# Patient Record
Sex: Male | Born: 1935 | Race: Black or African American | Hispanic: No | Smoking: Former smoker
Health system: Southern US, Community
[De-identification: ages and names within clinical notes are randomized; demographics above are authoritative.]

## PROBLEM LIST (undated history)

## (undated) ENCOUNTER — Emergency Department (HOSPITAL_COMMUNITY): Admission: EM | Payer: Medicare Other | Source: Home / Self Care

## (undated) DIAGNOSIS — F419 Anxiety disorder, unspecified: Secondary | ICD-10-CM

## (undated) DIAGNOSIS — N400 Enlarged prostate without lower urinary tract symptoms: Secondary | ICD-10-CM

## (undated) DIAGNOSIS — F17200 Nicotine dependence, unspecified, uncomplicated: Secondary | ICD-10-CM

## (undated) DIAGNOSIS — E785 Hyperlipidemia, unspecified: Secondary | ICD-10-CM

## (undated) DIAGNOSIS — K219 Gastro-esophageal reflux disease without esophagitis: Secondary | ICD-10-CM

## (undated) DIAGNOSIS — C20 Malignant neoplasm of rectum: Secondary | ICD-10-CM

## (undated) DIAGNOSIS — R55 Syncope and collapse: Secondary | ICD-10-CM

## (undated) DIAGNOSIS — I10 Essential (primary) hypertension: Secondary | ICD-10-CM

## (undated) DIAGNOSIS — I2510 Atherosclerotic heart disease of native coronary artery without angina pectoris: Secondary | ICD-10-CM

## (undated) HISTORY — DX: Nicotine dependence, unspecified, uncomplicated: F17.200

## (undated) HISTORY — PX: TONSILLECTOMY: SHX5217

## (undated) HISTORY — PX: APPENDECTOMY: SHX54

## (undated) HISTORY — DX: Benign prostatic hyperplasia without lower urinary tract symptoms: N40.0

## (undated) HISTORY — DX: Hyperlipidemia, unspecified: E78.5

## (undated) HISTORY — DX: Essential (primary) hypertension: I10

---

## 2004-04-18 ENCOUNTER — Ambulatory Visit: Payer: Self-pay | Admitting: Internal Medicine

## 2004-08-15 ENCOUNTER — Ambulatory Visit: Payer: Self-pay | Admitting: Internal Medicine

## 2005-02-15 ENCOUNTER — Ambulatory Visit: Payer: Self-pay | Admitting: Internal Medicine

## 2005-05-17 ENCOUNTER — Ambulatory Visit: Payer: Self-pay | Admitting: Internal Medicine

## 2005-08-30 ENCOUNTER — Ambulatory Visit: Payer: Self-pay | Admitting: Internal Medicine

## 2006-03-22 ENCOUNTER — Ambulatory Visit: Payer: Self-pay | Admitting: Internal Medicine

## 2006-03-22 LAB — CONVERTED CEMR LAB
ALT: 32 units/L (ref 0–40)
AST: 67 units/L — ABNORMAL HIGH (ref 0–37)
Bilirubin, Direct: 0.1 mg/dL (ref 0.0–0.3)
CO2: 31 meq/L (ref 19–32)
Calcium: 9.4 mg/dL (ref 8.4–10.5)
Chloride: 104 meq/L (ref 96–112)
Glucose, Bld: 102 mg/dL — ABNORMAL HIGH (ref 70–99)
HCT: 42.1 % (ref 39.0–52.0)
Hemoglobin: 13.4 g/dL (ref 13.0–17.0)
LDL Cholesterol: 119 mg/dL — ABNORMAL HIGH (ref 0–99)
Total Protein: 7.6 g/dL (ref 6.0–8.3)
VLDL: 16 mg/dL (ref 0–40)
WBC: 6.5 10*3/uL (ref 4.5–10.5)

## 2006-09-27 ENCOUNTER — Ambulatory Visit: Payer: Self-pay | Admitting: Internal Medicine

## 2007-02-07 DIAGNOSIS — I10 Essential (primary) hypertension: Secondary | ICD-10-CM | POA: Insufficient documentation

## 2007-02-07 HISTORY — DX: Essential (primary) hypertension: I10

## 2007-05-26 ENCOUNTER — Ambulatory Visit: Payer: Self-pay | Admitting: Internal Medicine

## 2007-05-26 DIAGNOSIS — E785 Hyperlipidemia, unspecified: Secondary | ICD-10-CM

## 2007-05-26 HISTORY — DX: Hyperlipidemia, unspecified: E78.5

## 2007-05-26 LAB — CONVERTED CEMR LAB
ALT: 27 units/L (ref 0–53)
AST: 53 units/L — ABNORMAL HIGH (ref 0–37)
Basophils Relative: 1.1 % — ABNORMAL HIGH (ref 0.0–1.0)
Bilirubin, Direct: 0.1 mg/dL (ref 0.0–0.3)
CO2: 30 meq/L (ref 19–32)
Chloride: 100 meq/L (ref 96–112)
Eosinophils Absolute: 0.1 10*3/uL (ref 0.0–0.6)
Eosinophils Relative: 1.9 % (ref 0.0–5.0)
GFR calc non Af Amer: 63 mL/min
Glucose, Bld: 101 mg/dL — ABNORMAL HIGH (ref 70–99)
HCT: 41.8 % (ref 39.0–52.0)
Lymphocytes Relative: 22.8 % (ref 12.0–46.0)
MCV: 83.5 fL (ref 78.0–100.0)
Neutrophils Relative %: 66.1 % (ref 43.0–77.0)
PSA: 3.44 ng/mL (ref 0.10–4.00)
RBC: 5 M/uL (ref 4.22–5.81)
Sodium: 138 meq/L (ref 135–145)
Total Bilirubin: 0.7 mg/dL (ref 0.3–1.2)
Total Protein: 7.4 g/dL (ref 6.0–8.3)
Triglycerides: 103 mg/dL (ref 0–149)
VLDL: 21 mg/dL (ref 0–40)
WBC: 7.1 10*3/uL (ref 4.5–10.5)

## 2007-07-08 ENCOUNTER — Ambulatory Visit: Payer: Self-pay | Admitting: Internal Medicine

## 2008-04-01 ENCOUNTER — Ambulatory Visit: Payer: Self-pay | Admitting: Internal Medicine

## 2008-04-01 DIAGNOSIS — N400 Enlarged prostate without lower urinary tract symptoms: Secondary | ICD-10-CM

## 2008-04-01 DIAGNOSIS — F17200 Nicotine dependence, unspecified, uncomplicated: Secondary | ICD-10-CM

## 2008-04-01 HISTORY — DX: Nicotine dependence, unspecified, uncomplicated: F17.200

## 2008-04-01 HISTORY — DX: Benign prostatic hyperplasia without lower urinary tract symptoms: N40.0

## 2008-10-19 ENCOUNTER — Ambulatory Visit: Payer: Self-pay | Admitting: Internal Medicine

## 2008-10-19 LAB — CONVERTED CEMR LAB
ALT: 26 units/L (ref 0–53)
AST: 56 units/L — ABNORMAL HIGH (ref 0–37)
Basophils Relative: 0.8 % (ref 0.0–3.0)
Bilirubin, Direct: 0 mg/dL (ref 0.0–0.3)
Chloride: 105 meq/L (ref 96–112)
Cholesterol: 172 mg/dL (ref 0–200)
Eosinophils Relative: 2.3 % (ref 0.0–5.0)
GFR calc non Af Amer: 69.07 mL/min (ref >60)
HCT: 40.9 % (ref 39.0–52.0)
LDL Cholesterol: 123 mg/dL — ABNORMAL HIGH (ref 0–99)
Lymphs Abs: 1.5 10*3/uL (ref 0.7–4.0)
MCV: 82.3 fL (ref 78.0–100.0)
Monocytes Absolute: 0.4 10*3/uL (ref 0.1–1.0)
Monocytes Relative: 6.7 % (ref 3.0–12.0)
Platelets: 195 10*3/uL (ref 150.0–400.0)
Potassium: 4 meq/L (ref 3.5–5.1)
RBC: 4.97 M/uL (ref 4.22–5.81)
Sodium: 140 meq/L (ref 135–145)
TSH: 1.51 microintl units/mL (ref 0.35–5.50)
Total Bilirubin: 0.5 mg/dL (ref 0.3–1.2)
Total CHOL/HDL Ratio: 5
Total Protein: 7.8 g/dL (ref 6.0–8.3)
VLDL: 16.2 mg/dL (ref 0.0–40.0)
WBC: 6.3 10*3/uL (ref 4.5–10.5)

## 2009-05-02 ENCOUNTER — Ambulatory Visit: Payer: Self-pay | Admitting: Internal Medicine

## 2009-11-28 ENCOUNTER — Ambulatory Visit: Payer: Self-pay | Admitting: Internal Medicine

## 2010-01-23 ENCOUNTER — Encounter: Payer: Self-pay | Admitting: Internal Medicine

## 2010-03-29 ENCOUNTER — Ambulatory Visit: Payer: Self-pay | Admitting: Internal Medicine

## 2010-03-29 LAB — CONVERTED CEMR LAB
ALT: 17 units/L (ref 0–53)
AST: 43 units/L — ABNORMAL HIGH (ref 0–37)
Albumin: 3.9 g/dL (ref 3.5–5.2)
Basophils Relative: 0.5 % (ref 0.0–3.0)
Chloride: 101 meq/L (ref 96–112)
Cholesterol: 184 mg/dL (ref 0–200)
Eosinophils Relative: 3.2 % (ref 0.0–5.0)
GFR calc non Af Amer: 55.75 mL/min (ref >60)
HCT: 37.2 % — ABNORMAL LOW (ref 39.0–52.0)
Hemoglobin: 12.3 g/dL — ABNORMAL LOW (ref 13.0–17.0)
LDL Cholesterol: 130 mg/dL — ABNORMAL HIGH (ref 0–99)
Lymphs Abs: 1.7 10*3/uL (ref 0.7–4.0)
MCV: 84.7 fL (ref 78.0–100.0)
Monocytes Absolute: 0.5 10*3/uL (ref 0.1–1.0)
Monocytes Relative: 7.1 % (ref 3.0–12.0)
Neutro Abs: 4.1 10*3/uL (ref 1.4–7.7)
Potassium: 4 meq/L (ref 3.5–5.1)
Sodium: 136 meq/L (ref 135–145)
TSH: 1.53 microintl units/mL (ref 0.35–5.50)
Total Protein: 7.2 g/dL (ref 6.0–8.3)
VLDL: 13 mg/dL (ref 0.0–40.0)
WBC: 6.5 10*3/uL (ref 4.5–10.5)

## 2010-04-06 ENCOUNTER — Telehealth: Payer: Self-pay | Admitting: Internal Medicine

## 2010-07-04 NOTE — Miscellaneous (Signed)
Summary: Flu Shot/[facility]  Flu Shot/[facility]   Imported By: Maryln Gottron 01/27/2010 10:53:26  _____________________________________________________________________  External Attachment:    Type:   Image     Comment:   External Document

## 2010-07-04 NOTE — Assessment & Plan Note (Signed)
Summary: F/U APPT // RS   Vital Signs:  Phelps profile:   77 year old male Weight:      181 pounds Temp:     98.1 degrees F oral BP sitting:   170 / 80  (right arm) Cuff size:   regular  Vitals Entered By: Duard Brady LPN (November 28, 2009 8:12 AM) CC: rov - doing well Is Phelps Diabetic? No   CC:  rov - doing well.  History of Present Illness: Isaac Phelps who is seen today for follow-up of his hypertension and dyslipidemia.  He feels quite well, but has been often noncompliant with his medications.  He states that he often forgets to take the both his simvastatin and antihypertensive medications.  He feels well and denies any cardiopulmonary complaints.  Initial blood pressure on arrival 170/80  Preventive Screening-Counseling & Management  Alcohol-Tobacco     Smoking Status: current     Smoking Cessation Counseling: yes  Allergies (verified): No Known Drug Allergies  Past History:  Past Medical History: Reviewed history from 04/01/2008 and no changes required. Hypertension Hyperlipidemia ongoing tobacco use Benign prostatic hypertrophy  Past Surgical History: Reviewed history from 07/08/2007 and no changes required. Tonsillectomy Arthroscopic Knee surgery no prior colonoscopy-declines  Review of Systems  The Phelps denies anorexia, fever, weight loss, weight gain, vision loss, decreased hearing, hoarseness, chest pain, syncope, dyspnea on exertion, peripheral edema, prolonged cough, headaches, hemoptysis, abdominal pain, melena, hematochezia, severe indigestion/heartburn, hematuria, incontinence, genital sores, muscle weakness, suspicious skin lesions, transient blindness, difficulty walking, depression, unusual weight change, abnormal bleeding, enlarged lymph nodes, angioedema, breast masses, and testicular masses.    Physical Exam  General:  Well-developed,well-nourished,in no acute distress; alert,appropriate and cooperative throughout  examination; repeat blood pressure 140/80 Head:  Normocephalic and atraumatic without obvious abnormalities. No apparent alopecia or balding. Eyes:  No corneal or conjunctival inflammation noted. EOMI. Perrla. Funduscopic exam benign, without hemorrhages, exudates or papilledema. Vision grossly normal. Mouth:  Oral mucosa and oropharynx without lesions or exudates.  Teeth in good repair. Neck:  No deformities, masses, or tenderness noted. Lungs:  a few bibasilar crackles noted Heart:  Normal rate and regular rhythm. S1 and S2 normal without gallop, murmur, click, rub or other extra sounds. Abdomen:  Bowel sounds positive,abdomen soft and non-tender without masses, organomegaly or hernias noted. Msk:  No deformity or scoliosis noted of thoracic or lumbar spine.   Pulses:  R and L carotid,radial,femoral,dorsalis pedis and posterior tibial pulses are full and equal bilaterally Extremities:  No clubbing, cyanosis, edema, or deformity noted with normal full range of motion of all joints.     Impression & Recommendations:  Problem # 1:  TOBACCO ABUSE (ICD-305.1)  Problem # 2:  HYPERLIPIDEMIA (ICD-272.4)  His updated medication list for this problem includes:    Simvastatin 40 Mg Tabs (Simvastatin) .Marland Kitchen... 1/2 once daily  Problem # 3:  HYPERTENSION (ICD-401.9)  His updated medication list for this problem includes:    Lisinopril-hydrochlorothiazide 20-25 Mg Tabs (Lisinopril-hydrochlorothiazide) ..... One daily  Complete Medication List: 1)  Simvastatin 40 Mg Tabs (Simvastatin) .... 1/2 once daily 2)  Lisinopril-hydrochlorothiazide 20-25 Mg Tabs (Lisinopril-hydrochlorothiazide) .... One daily  Phelps Instructions: 1)  Please schedule a follow-up appointment in 4 months. 2)  Limit your Sodium (Salt) to less than 2 grams a day(slightly less than 1/2 a teaspoon) to prevent fluid retention, swelling, or worsening of symptoms. 3)  Tobacco is very bad for your health and your loved ones! You  Should stop smoking!Marland Kitchen 4)  It is important that you exercise regularly at least 20 minutes 5 times a week. If you develop chest pain, have severe difficulty breathing, or feel very tired , stop exercising immediately and seek medical attention. Prescriptions: LISINOPRIL-HYDROCHLOROTHIAZIDE 20-25 MG  TABS (LISINOPRIL-HYDROCHLOROTHIAZIDE) one daily  #90 x 6   Entered and Authorized by:   Gordy Savers  MD   Signed by:   Gordy Savers  MD on 11/28/2009   Method used:   [facility]ically to        [facility]  [address] [id number]       [address]       [place], [place]  [address]       Ph: [phone] or [phone]       Fax: [phone]   RxID:   [id number] SIMVASTATIN 40 MG  TABS (SIMVASTATIN) 1/2 once daily  #90 x 6   Entered and Authorized by:   Gordy Savers  MD   Signed by:   Gordy Savers  MD on 11/28/2009   Method used:   [facility] to        [facility]  [address] [id number]       [address]       [place], [place]  [address]       Ph: [phone] or [phone]       Fax: [phone]   RxID:   [id number]   Appended Document: Date for Flu    Clinical Lists Changes  Observations: Added new observation of FLU VAX: Historical (01/23/2010 11:28)       Immunization History:  Influenza Immunization History:    Influenza:  historical (01/23/2010)

## 2010-07-04 NOTE — Assessment & Plan Note (Signed)
Summary: pt will come in fasting/njr   Vital Signs:  Patient profile:   77 year old male Height:      72 inches Weight:      178 pounds BMI:     24.23 Temp:     98.2 degrees F oral Pulse rate:   76 / minute Resp:     14 per minute BP sitting:   140 / 80  (left arm)  Vitals Entered By: Willy Eddy, LPN (March 29, 2010 8:51 AM) CC: annual visit for disease management-fasting this am  Is Patient Diabetic? No   CC:  annual visit for disease management-fasting this am .  History of Present Illness: 77 year old patient who is seen today for a comprehensive evaluation.  He has a history of treated hypertension and dyslipidemia, and does remarkably well  Here for Medicare AWV:  1.   Risk factors based on Past M, S, F history:  cardiovascular risk factors include hypertension, and dyslipidemia. 2.   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  Also walks daily 3.   Depression/mood: no history of depression or mood disorder 4.   Hearing: no deficits 5.   ADL's: independent in all aspects of daily living 6.   Fall Risk: low 7.   Home Safety: no problems identified 8.   Height, weight, &visual acuity:height and weight stable has had recent cataract extraction surgery and lens implantation.  Very pleased with the outcome 9.   Counseling: heart healthy, salt restricted diet.  Encouraged 10.   Labs ordered based on risk factors: laboratory profile, including lipid panel will be reviewed 11.           Referral Coordination-  not appropriate at this time ;has declined screening colonoscopies 12.           Care Plan- continue, heart healthy diet, exercise regimen 13.            Cognitive Assessment- alert and oriented, with normal affect.  No history of memory dysfunction.  Handles all executive functioning without difficulty   Preventive Screening-Counseling & Management  Alcohol-Tobacco     Smoking Status: current     Smoking Cessation  Counseling: yes  Current Problems (verified): 1)  Preventive Health Care  (ICD-V70.0) 2)  Tobacco Abuse  (ICD-305.1) 3)  Benign Prostatic Hypertrophy  (ICD-600.00) 4)  Benign Prostatic Hypertrophy, Hx of  (ICD-V13.8) 5)  Hyperlipidemia  (ICD-272.4) 6)  Hypertension  (ICD-401.9)  Current Medications (verified): 1)  Simvastatin 40 Mg  Tabs (Simvastatin) .... 1/2 Once Daily 2)  Lisinopril-Hydrochlorothiazide 20-25 Mg  Tabs (Lisinopril-Hydrochlorothiazide) .... One Daily  Allergies (verified): No Known Drug Allergies  Contraindications/Deferment of Procedures/Staging:    Test/Procedure: Pneumovax vaccine    Reason for deferment: patient declined   Past History:  Past Medical History: Reviewed history from 04/01/2008 and no changes required. Hypertension Hyperlipidemia ongoing tobacco use Benign prostatic hypertrophy  Past Surgical History: Reviewed history from 07/08/2007 and no changes required. Tonsillectomy Arthroscopic Knee surgery no prior colonoscopy-declines  Family History: Reviewed history from 05/26/2007 and no changes required. Family History of Arthritis Family History Hypertension Family History of Stroke M 1st degree relative <50 father died age 61 of probable CVA mother died at age 78  One brother one sister in good health.  No family history cancer  Social History: Reviewed history from 05/26/2007 and no changes required. Retired  works part-time as a Electrical engineer Married Current Smoker one child  Review of Systems  The patient denies anorexia, fever, weight loss, weight gain, vision loss, decreased hearing, hoarseness, chest pain, syncope, dyspnea on exertion, peripheral edema, prolonged cough, headaches, hemoptysis, abdominal pain, melena, hematochezia, severe indigestion/heartburn, hematuria, incontinence, genital sores, muscle weakness, suspicious skin lesions, transient blindness, difficulty walking, depression, unusual weight change,  abnormal bleeding, enlarged lymph nodes, angioedema, breast masses, and testicular masses.    Physical Exam  General:  Well-developed,well-nourished,in no acute distress; alert,appropriate and cooperative throughout examination Head:  Normocephalic and atraumatic without obvious abnormalities. No apparent alopecia or balding. Eyes:  No corneal or conjunctival inflammation noted. EOMI. Perrla. Funduscopic exam benign, without hemorrhages, exudates or papilledema. Vision grossly normal. Ears:  External ear exam shows no significant lesions or deformities.  Otoscopic examination reveals clear canals, tympanic membranes are intact bilaterally without bulging, retraction, inflammation or discharge. Hearing is grossly normal bilaterally. Nose:  External nasal examination shows no deformity or inflammation. Nasal mucosa are pink and moist without lesions or exudates. Mouth:  Oral mucosa and oropharynx without lesions or exudates.  Neck:  No deformities, masses, or tenderness noted. Chest Wall:  No deformities, masses, tenderness or gynecomastia noted. Breasts:  No masses or gynecomastia noted Lungs:  Normal respiratory effort, chest expands symmetrically. Lungs are clear to auscultation, no crackles or wheezes. Heart:  Normal rate and regular rhythm. S1 and S2 normal without gallop, murmur, click, rub or other extra sounds. Abdomen:  Bowel sounds positive,abdomen soft and non-tender without masses, organomegaly or hernias noted. Rectal:  No external abnormalities noted. Normal sphincter tone. No rectal masses or tenderness. Genitalia:  Testes bilaterally descended without nodularity, tenderness or masses. No scrotal masses or lesions. No penis lesions or urethral discharge. Prostate:  3+ enlarged.  3+ enlarged.   Msk:  No deformity or scoliosis noted of thoracic or lumbar spine.   Pulses:  R and L carotid,radial,femoral,dorsalis pedis and posterior tibial pulses are full and equal  bilaterally Extremities:  No clubbing, cyanosis, edema, or deformity noted with normal full range of motion of all joints.   Neurologic:  No cranial nerve deficits noted. Station and gait are normal. Plantar reflexes are down-going bilaterally. DTRs are symmetrical throughout. Sensory, motor and coordinative functions appear intact. Skin:  Intact without suspicious lesions or rashes Cervical Nodes:  No lymphadenopathy noted Axillary Nodes:  No palpable lymphadenopathy Inguinal Nodes:  No significant adenopathy Psych:  Cognition and judgment appear intact. Alert and cooperative with normal attention span and concentration. No apparent delusions, illusions, hallucinations   Impression & Recommendations:  Problem # 1:  PREVENTIVE HEALTH CARE (ICD-V70.0)  Orders: Medicare -1st Annual Wellness Visit [phone])  Problem # 2:  HYPERTENSION (ICD-401.9)  His updated medication list for this problem includes:    Lisinopril-hydrochlorothiazide 20-25 Mg Tabs (Lisinopril-hydrochlorothiazide) ..... One daily  Orders: TLB-BMP (Basic Metabolic Panel-BMET) (80048-METABOL) TLB-CBC Platelet - w/Differential (85025-CBCD) TLB-Hepatic/Liver Function Pnl (80076-HEPATIC) Specimen Handling (78295) Venipuncture (62130)  Problem # 3:  HYPERLIPIDEMIA (ICD-272.4)  His updated medication list for this problem includes:    Simvastatin 40 Mg Tabs (Simvastatin) .Marland Kitchen... 1/2 once daily  Orders: Venipuncture (86578) TLB-Lipid Panel (80061-LIPID) TLB-BMP (Basic Metabolic Panel-BMET) (80048-METABOL) TLB-CBC Platelet - w/Differential (85025-CBCD) TLB-Hepatic/Liver Function Pnl (80076-HEPATIC) TLB-TSH (Thyroid Stimulating Hormone) (84443-TSH) Specimen Handling (46962) Venipuncture (95284)  Orders: Venipuncture (13244) TLB-Lipid Panel (80061-LIPID) TLB-BMP (Basic Metabolic Panel-BMET) (80048-METABOL) TLB-CBC Platelet - w/Differential (85025-CBCD) TLB-Hepatic/Liver Function Pnl (80076-HEPATIC) TLB-TSH (Thyroid  Stimulating Hormone) (84443-TSH)  Complete Medication List: 1)  Simvastatin 40 Mg Tabs (Simvastatin) .... 1/2 once daily 2)  Lisinopril-hydrochlorothiazide 20-25 Mg Tabs (Lisinopril-hydrochlorothiazide) .Marland KitchenMarland KitchenMarland Kitchen  One daily  Patient Instructions: 1)  Please schedule a follow-up appointment in 6 months. 2)  Limit your Sodium (Salt). 3)  It is important that you exercise regularly at least 20 minutes 5 times a week. If you develop chest pain, have severe difficulty breathing, or feel very tired , stop exercising immediately and seek medical attention. 4)  Check your Blood Pressure regularly. If it is above: 150/90  you should make an appointment. Prescriptions: LISINOPRIL-HYDROCHLOROTHIAZIDE 20-25 MG  TABS (LISINOPRIL-HYDROCHLOROTHIAZIDE) one daily  #90 x 6   Entered and Authorized by:   Gordy Savers  MD   Signed by:   Gordy Savers  MD on 03/29/2010   Method used:   [facility]        [facility]  [id number]       [address]       [place], [place]  [address]       Ph: [phone] or [phone]       Fax: [phone]   RxID:   [id number] SIMVASTATIN 40 MG  TABS (SIMVASTATIN) 1/2 once daily  #90 x 6   Entered and Authorized by:   Gordy Savers  MD   Signed by:   Gordy Savers  MD on 03/29/2010   Method used:   [facility]        [facility]  [address] [id number]       [address]       [place], [place]  [address]       Ph: [phone] or [phone]       Fax: [phone]   RxID:   [id number]    Orders Added: 1)  Medicare -1st Annual Wellness Visit [G0438] 2)  Est. Patient Level III [28315] 3)  Venipuncture [17616] 4)  TLB-Lipid Panel [80061-LIPID] 5)  TLB-BMP (Basic Metabolic Panel-BMET) [80048-METABOL] 6)  TLB-CBC Platelet - w/Differential [85025-CBCD] 7)  TLB-Hepatic/Liver Function Pnl [80076-HEPATIC] 8)  TLB-TSH (Thyroid Stimulating Hormone) [84443-TSH] 9)  Specimen Handling  [99000] 10)  Venipuncture [07371]

## 2010-07-04 NOTE — Progress Notes (Signed)
Summary: resend rxs please  Phone Note Refill Request Call back at Home Phone [phone] Message from:  Patient---live call  Refills Requested: Medication #1:  SIMVASTATIN 40 MG  TABS 1/2 once daily  Medication #2:  LISINOPRIL-HYDROCHLOROTHIAZIDE 20-25 MG  TABS one daily. resend to [address].......[facility], please  Initial call taken by: Warnell Forester,  April 06, 2010 1:33 PM  Follow-up for Phone Call        [facility] Follow-up by: Duard Brady LPN,  April 06, 2010 2:18 PM    Prescriptions: LISINOPRIL-HYDROCHLOROTHIAZIDE 20-25 MG  TABS (LISINOPRIL-HYDROCHLOROTHIAZIDE) one daily  #90 x 6   Entered by:   Duard Brady LPN   Authorized by:   Gordy Savers  MD   Signed by:   Duard Brady LPN on 47/82/9562   Method used:   Faxed to ...       [facility]  [address] [phone]* (retail)       [address]       [place], [place]  [address]       Ph: [phone]       Fax: [phone]   RxID:   [id number] SIMVASTATIN 40 MG  TABS (SIMVASTATIN) 1/2 once daily  #90 x 6   Entered by:   Duard Brady LPN   Authorized by:   Gordy Savers  MD   Signed by:   Duard Brady LPN on 87/56/4332   Method used:   Faxed to ...       [facility]  [address] [phone]* (retail)       [address]       [place], [place]  [address]       Ph: [phone]       Fax: [phone]   RxID:   [id number]

## 2010-09-22 ENCOUNTER — Encounter: Payer: Self-pay | Admitting: Internal Medicine

## 2010-09-27 ENCOUNTER — Ambulatory Visit (INDEPENDENT_AMBULATORY_CARE_PROVIDER_SITE_OTHER): Payer: Medicare Other | Admitting: Internal Medicine

## 2010-09-27 ENCOUNTER — Encounter: Payer: Self-pay | Admitting: Internal Medicine

## 2010-09-27 DIAGNOSIS — E785 Hyperlipidemia, unspecified: Secondary | ICD-10-CM

## 2010-09-27 DIAGNOSIS — F17200 Nicotine dependence, unspecified, uncomplicated: Secondary | ICD-10-CM

## 2010-09-27 DIAGNOSIS — I10 Essential (primary) hypertension: Secondary | ICD-10-CM

## 2010-09-27 MED ORDER — SIMVASTATIN 40 MG PO TABS: 20.0000 mg | ORAL_TABLET | Freq: Every day | ORAL | Status: DC

## 2010-09-27 MED ORDER — LISINOPRIL-HYDROCHLOROTHIAZIDE 20-25 MG PO TABS: 1.0000 | ORAL_TABLET | Freq: Every day | ORAL | Status: DC

## 2010-09-27 NOTE — Patient Instructions (Addendum)
Get plenty of rest, Drink lots of  clear liquids, and use Tylenol or ibuprofen for fever and discomfort.    Use MUCINEX or Robitussin for cough  Limit your sodium (Salt) intake  Smoking tobacco is very bad for your health. You should stop smoking immediately.

## 2010-09-27 NOTE — Progress Notes (Signed)
  Subjective:    Patient ID: Isaac Phelps, male    DOB: [birth year], 77 y.o.   MRN: [id number]  HPI  77 year old patient who is seen today for followup of his hypertension. He has a history of dyslipidemia and ongoing tobacco use. For the past 2 weeks he has had a URI. For the past couple days he has felt much improved there's been no recent fever blood pressure is controlled on combination lisinopril hydrochlorothiazide. Remains on simvastatin 40 mg daily which he tolerates well and    Review of Systems  Constitutional: Negative for fever, chills, appetite change and fatigue.  HENT: Negative for hearing loss, ear pain, congestion, sore throat, trouble swallowing, neck stiffness, dental problem, voice change and tinnitus.   Eyes: Negative for pain, discharge and visual disturbance.  Respiratory: Negative for cough, chest tightness, wheezing and stridor.   Cardiovascular: Negative for chest pain, palpitations and leg swelling.  Gastrointestinal: Negative for nausea, vomiting, abdominal pain, diarrhea, constipation, blood in stool and abdominal distention.  Genitourinary: Negative for urgency, hematuria, flank pain, discharge, difficulty urinating and genital sores.  Musculoskeletal: Negative for myalgias, back pain, joint swelling, arthralgias and gait problem.  Skin: Negative for rash.  Neurological: Negative for dizziness, syncope, speech difficulty, weakness, numbness and headaches.  Hematological: Negative for adenopathy. Does not bruise/bleed easily.  Psychiatric/Behavioral: Negative for behavioral problems and dysphoric mood. The patient is not nervous/anxious.        Objective:   Physical Exam  Constitutional: He is oriented to person, place, and time. He appears well-developed.  HENT:  Head: Normocephalic.  Right Ear: External ear normal.  Left Ear: External ear normal.  Eyes: Conjunctivae and EOM are normal.  Neck: Normal range of motion.  Cardiovascular: Normal rate and normal  heart sounds.   Pulmonary/Chest: Effort normal.       Scattered coarse rhonchi especially involving the left lower lung field O2 saturation 99%  Abdominal: Bowel sounds are normal.  Musculoskeletal: Normal range of motion. He exhibits no edema and no tenderness.  Neurological: He is alert and oriented to person, place, and time.  Psychiatric: He has a normal mood and affect. His behavior is normal.          Assessment & Plan:   URI. Appears to be improving for the past couple of days we'll continue symptomatic treatment Hypertension stable Dyslipidemia Ongoing tobacco use. Cessation of smoking encouraged   We'll see in 6 months for his annual exam

## 2011-04-03 ENCOUNTER — Encounter: Payer: Medicare Other | Admitting: Internal Medicine

## 2011-05-14 ENCOUNTER — Ambulatory Visit (INDEPENDENT_AMBULATORY_CARE_PROVIDER_SITE_OTHER): Payer: Medicare Other | Admitting: Internal Medicine

## 2011-05-14 ENCOUNTER — Encounter: Payer: Self-pay | Admitting: Internal Medicine

## 2011-05-14 VITALS — BP 120/82 | HR 80 | Temp 98.4°F | Resp 16 | Wt 181.0 lb

## 2011-05-14 DIAGNOSIS — Z0000 Encounter for general adult medical examination without abnormal findings: Secondary | ICD-10-CM

## 2011-05-14 DIAGNOSIS — E785 Hyperlipidemia, unspecified: Secondary | ICD-10-CM

## 2011-05-14 DIAGNOSIS — I10 Essential (primary) hypertension: Secondary | ICD-10-CM

## 2011-05-14 DIAGNOSIS — F17200 Nicotine dependence, unspecified, uncomplicated: Secondary | ICD-10-CM

## 2011-05-14 DIAGNOSIS — N400 Enlarged prostate without lower urinary tract symptoms: Secondary | ICD-10-CM

## 2011-05-14 LAB — COMPREHENSIVE METABOLIC PANEL
CO2: 28 mEq/L (ref 19–32)
Creatinine, Ser: 1.7 mg/dL — ABNORMAL HIGH (ref 0.4–1.5)
GFR: 51.74 mL/min — ABNORMAL LOW (ref >60.00)
Glucose, Bld: 98 mg/dL (ref 70–99)
Total Bilirubin: 0.6 mg/dL (ref 0.3–1.2)
Total Protein: 8 g/dL (ref 6.0–8.3)

## 2011-05-14 LAB — LIPID PANEL
Cholesterol: 170 mg/dL (ref 0–200)
HDL: 39.1 mg/dL (ref >39.00)
Triglycerides: 103 mg/dL (ref 0.0–149.0)

## 2011-05-14 LAB — CBC WITH DIFFERENTIAL/PLATELET
Eosinophils Relative: 2.3 % (ref 0.0–5.0)
HCT: 40.3 % (ref 39.0–52.0)
Hemoglobin: 13 g/dL (ref 13.0–17.0)
Lymphs Abs: 2.3 10*3/uL (ref 0.7–4.0)
Monocytes Relative: 7.1 % (ref 3.0–12.0)
Platelets: 184 10*3/uL (ref 150.0–400.0)
WBC: 6.3 10*3/uL (ref 4.5–10.5)

## 2011-05-14 LAB — TSH: TSH: 2.24 u[IU]/mL (ref 0.35–5.50)

## 2011-05-14 MED ORDER — SIMVASTATIN 40 MG PO TABS: 20.0000 mg | ORAL_TABLET | Freq: Every day | ORAL | Status: DC

## 2011-05-14 MED ORDER — LISINOPRIL-HYDROCHLOROTHIAZIDE 20-25 MG PO TABS: 1.0000 | ORAL_TABLET | Freq: Every day | ORAL | Status: DC

## 2011-05-14 NOTE — Patient Instructions (Signed)
Limit your sodium (Salt) intake    It is important that you exercise regularly, at least 20 minutes 3 to 4 times per week.  If you develop chest pain or shortness of breath seek  medical attention.  Smoking tobacco is very bad for your health. You should stop smoking immediately.  Return in one year for follow-up  

## 2011-05-14 NOTE — Progress Notes (Signed)
Subjective:    Patient ID: Isaac Phelps, male    DOB: 10/16/1930, 77 y.o.   MRN: [id number]  HPI History of Present Illness:   77 year-old patient who is seen today for a comprehensive evaluation. He has a history of treated hypertension and dyslipidemia, and does remarkably well   Here for Medicare AWV:   1. Risk factors based on Past M, S, F history: cardiovascular risk factors include hypertension, and dyslipidemia.  2. 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 Also walks daily  3. Depression/mood: no history of depression or mood disorder  4. Hearing: no deficits  5. ADL's: independent in all aspects of daily living  6. Fall Risk: low  7. Home Safety: no problems identified  8. Height, weight, &visual acuity:height and weight stable has had recent cataract extraction surgery and lens implantation. Very pleased with the outcome  9. Counseling: heart healthy, salt restricted diet. Encouraged  10. Labs ordered based on risk factors: laboratory profile, including lipid panel will be reviewed  11. Referral Coordination- not appropriate at this time ;has declined screening colonoscopies  12. Care Plan- continue, heart healthy diet, exercise regimen  13. Cognitive Assessment- alert and oriented, with normal affect. No history of memory dysfunction. Handles all executive functioning without difficulty   Preventive Screening-Counseling & Management  Alcohol-Tobacco  Smoking Status: current  Smoking Cessation Counseling: yes   Current Problems (verified):   1) Preventive Health Care (ICD-V70.0)  2) Tobacco Abuse (ICD-305.1)  3) Benign Prostatic Hypertrophy (ICD-600.00)  4) Benign Prostatic Hypertrophy, Hx of (ICD-V13.8)  5) Hyperlipidemia (ICD-272.4)  6) Hypertension (ICD-401.9)   Current Medications (verified):  1) Simvastatin 40 Mg Tabs (Simvastatin) .... 1/2 Once Daily  2) Lisinopril-Hydrochlorothiazide 20-25 Mg Tabs  (Lisinopril-Hydrochlorothiazide) .... One Daily   Allergies (verified):  No Known Drug Allergies  Contraindications/Deferment of Procedures/Staging:  Test/Procedure: Pneumovax vaccine  Reason for deferment: patient declined   Past History:  Past Medical History:  Reviewed history from 04/01/2008 and no changes required.  Hypertension  Hyperlipidemia  ongoing tobacco use  Benign prostatic hypertrophy   Past Surgical History:  Reviewed history from 07/08/2007 and no changes required.  Tonsillectomy  Arthroscopic Knee surgery  no prior colonoscopy-declines   Family History:  Reviewed history from 05/26/2007 and no changes required.  Family History of Arthritis  Family History Hypertension  Family History of Stroke M 1st degree relative <50  father died age 32 of probable CVA  mother died at age 48  One brother one sister in good health. No family history cancer   Social History:  Reviewed history from 05/26/2007 and no changes required.  Retired works part-time as a Electrical engineer  Married  Current Smoker  one child    Review of Systems  Constitutional: Negative for fever, chills, activity change, appetite change and fatigue.  HENT: Negative for hearing loss, ear pain, congestion, rhinorrhea, sneezing, mouth sores, trouble swallowing, neck pain, neck stiffness, dental problem, voice change, sinus pressure and tinnitus.   Eyes: Negative for photophobia, pain, redness and visual disturbance.  Respiratory: Negative for apnea, cough, choking, chest tightness, shortness of breath and wheezing.   Cardiovascular: Negative for chest pain, palpitations and leg swelling.  Gastrointestinal: Negative for nausea, vomiting, abdominal pain, diarrhea, constipation, blood in stool, abdominal distention, anal bleeding and rectal pain.  Genitourinary: Negative for dysuria, urgency, frequency, hematuria, flank pain, decreased urine volume, discharge, penile swelling, scrotal swelling,  difficulty urinating, genital sores and testicular pain.  Musculoskeletal:  Negative for myalgias, back pain, joint swelling, arthralgias and gait problem.  Skin: Negative for color change, rash and wound.  Neurological: Negative for dizziness, tremors, seizures, syncope, facial asymmetry, speech difficulty, weakness, light-headedness, numbness and headaches.  Hematological: Negative for adenopathy. Does not bruise/bleed easily.  Psychiatric/Behavioral: Negative for suicidal ideas, hallucinations, behavioral problems, confusion, sleep disturbance, self-injury, dysphoric mood, decreased concentration and agitation. The patient is not nervous/anxious.        Objective:   Physical Exam  Constitutional: He appears well-developed and well-nourished.  HENT:  Head: Normocephalic and atraumatic.  Right Ear: External ear normal.  Left Ear: External ear normal.  Nose: Nose normal.  Mouth/Throat: Oropharynx is clear and moist.  Eyes: Conjunctivae and EOM are normal. Pupils are equal, round, and reactive to light. No scleral icterus.  Neck: Normal range of motion. Neck supple. No JVD present. No thyromegaly present.  Cardiovascular: Regular rhythm, normal heart sounds and intact distal pulses.  Exam reveals no gallop and no friction rub.   No murmur heard.      Dorsalis pedis pulses not easily palpable. Posterior tibial pulses full  Pulmonary/Chest: Effort normal and breath sounds normal. He exhibits no tenderness.  Abdominal: Soft. Bowel sounds are normal. He exhibits no distension and no mass. There is no tenderness.       Bilateral femoral hernias  Genitourinary: Prostate normal. Guaiac negative stool.       Prostate +3  Musculoskeletal: Normal range of motion. He exhibits no edema and no tenderness.       Flat feet  Lymphadenopathy:    He has no cervical adenopathy.  Neurological: He is alert. He has normal reflexes. No cranial nerve deficit. Coordination normal.  Skin: Skin is warm and dry.  No rash noted.  Psychiatric: He has a normal mood and affect. His behavior is normal.          Assessment & Plan:   Preventive health Hypertension stable BPH Dyslipidemia Ongoing tobacco use  Laboratory update will be obtained Medications refilled Smoking cessation encouraged Recheck one year

## 2011-07-10 ENCOUNTER — Encounter: Payer: Self-pay | Admitting: Internal Medicine

## 2011-07-10 ENCOUNTER — Ambulatory Visit (INDEPENDENT_AMBULATORY_CARE_PROVIDER_SITE_OTHER): Payer: Medicare Other | Admitting: Internal Medicine

## 2011-07-10 DIAGNOSIS — R071 Chest pain on breathing: Secondary | ICD-10-CM

## 2011-07-10 DIAGNOSIS — R0789 Other chest pain: Secondary | ICD-10-CM

## 2011-07-10 DIAGNOSIS — I10 Essential (primary) hypertension: Secondary | ICD-10-CM

## 2011-07-10 DIAGNOSIS — E785 Hyperlipidemia, unspecified: Secondary | ICD-10-CM

## 2011-07-10 NOTE — Progress Notes (Signed)
  Subjective:    Patient ID: Isaac Phelps, male    DOB: 18-Dec-1930, 78 y.o.   MRN: [id number]  HPI  78 year old patient who presents with a one month history of some discomfort involving the right anterior chest wall region. Over this period time has also noted a slight fullness in this area. This occurred while doing heavy lifting and pain is aggravated with movement    Review of Systems  Cardiovascular: Chest pain: right anterior chest wall pain.       Objective:   Physical Exam  Constitutional: He is oriented to person, place, and time. He appears well-developed.  HENT:  Head: Normocephalic.  Right Ear: External ear normal.  Left Ear: External ear normal.  Eyes: Conjunctivae and EOM are normal.  Neck: Normal range of motion.  Cardiovascular: Normal rate and normal heart sounds.   Pulmonary/Chest: He has rales. He exhibits tenderness.       A few bibasilar crackles  The patient has some mild tenderness over the right lower costal chondral junction  Abdominal: Soft. Bowel sounds are normal. He exhibits no distension and no mass. There is no tenderness. There is no rebound and no guarding.       Slight fullness was noted beneath the right costal margin. This is felt to be consistent with a mild bowel wall hernia. There appeared to be no hepatic enlargement or mass  Musculoskeletal: Normal range of motion. He exhibits no edema and no tenderness.  Neurological: He is alert and oriented to person, place, and time.  Psychiatric: He has a normal mood and affect. His behavior is normal.          Assessment & Plan:   Chest wall pain. This appears to be some mild costochondritis with some localized tenderness over the costochondral joint Also appears to have a mild abdominal wall hernia with some bulging in the right upper quadrant region Hypertension stable  Patient will call if symptoms do not improve. We'll reassess at the time of his physical later this year

## 2011-07-10 NOTE — Patient Instructions (Signed)
Limit your sodium (Salt) intake  Call or return to clinic prn if these symptoms worsen or fail to improve as anticipated.   

## 2011-07-16 ENCOUNTER — Ambulatory Visit (INDEPENDENT_AMBULATORY_CARE_PROVIDER_SITE_OTHER): Payer: Medicare Other | Admitting: Internal Medicine

## 2011-07-16 ENCOUNTER — Encounter: Payer: Self-pay | Admitting: Internal Medicine

## 2011-07-16 ENCOUNTER — Ambulatory Visit (INDEPENDENT_AMBULATORY_CARE_PROVIDER_SITE_OTHER)
Admission: RE | Admit: 2011-07-16 | Discharge: 2011-07-16 | Disposition: A | Discharge status: Home / Self Care | Payer: Medicare Other | Source: Ambulatory Visit | Attending: Internal Medicine | Admitting: Internal Medicine

## 2011-07-16 DIAGNOSIS — R071 Chest pain on breathing: Secondary | ICD-10-CM

## 2011-07-16 DIAGNOSIS — I10 Essential (primary) hypertension: Secondary | ICD-10-CM

## 2011-07-16 DIAGNOSIS — R0789 Other chest pain: Secondary | ICD-10-CM

## 2011-07-16 DIAGNOSIS — F17200 Nicotine dependence, unspecified, uncomplicated: Secondary | ICD-10-CM

## 2011-07-16 IMAGING — CR DG RIBS 2V*R*
4 series · 4 of 4 positions shown · non-contrast
Comparison: Chest x-ray today.

CLINICAL DATA: Right lower rib pain, chest pain.

RIGHT RIBS - 2 VIEW

[view not recorded (1 of 4)]
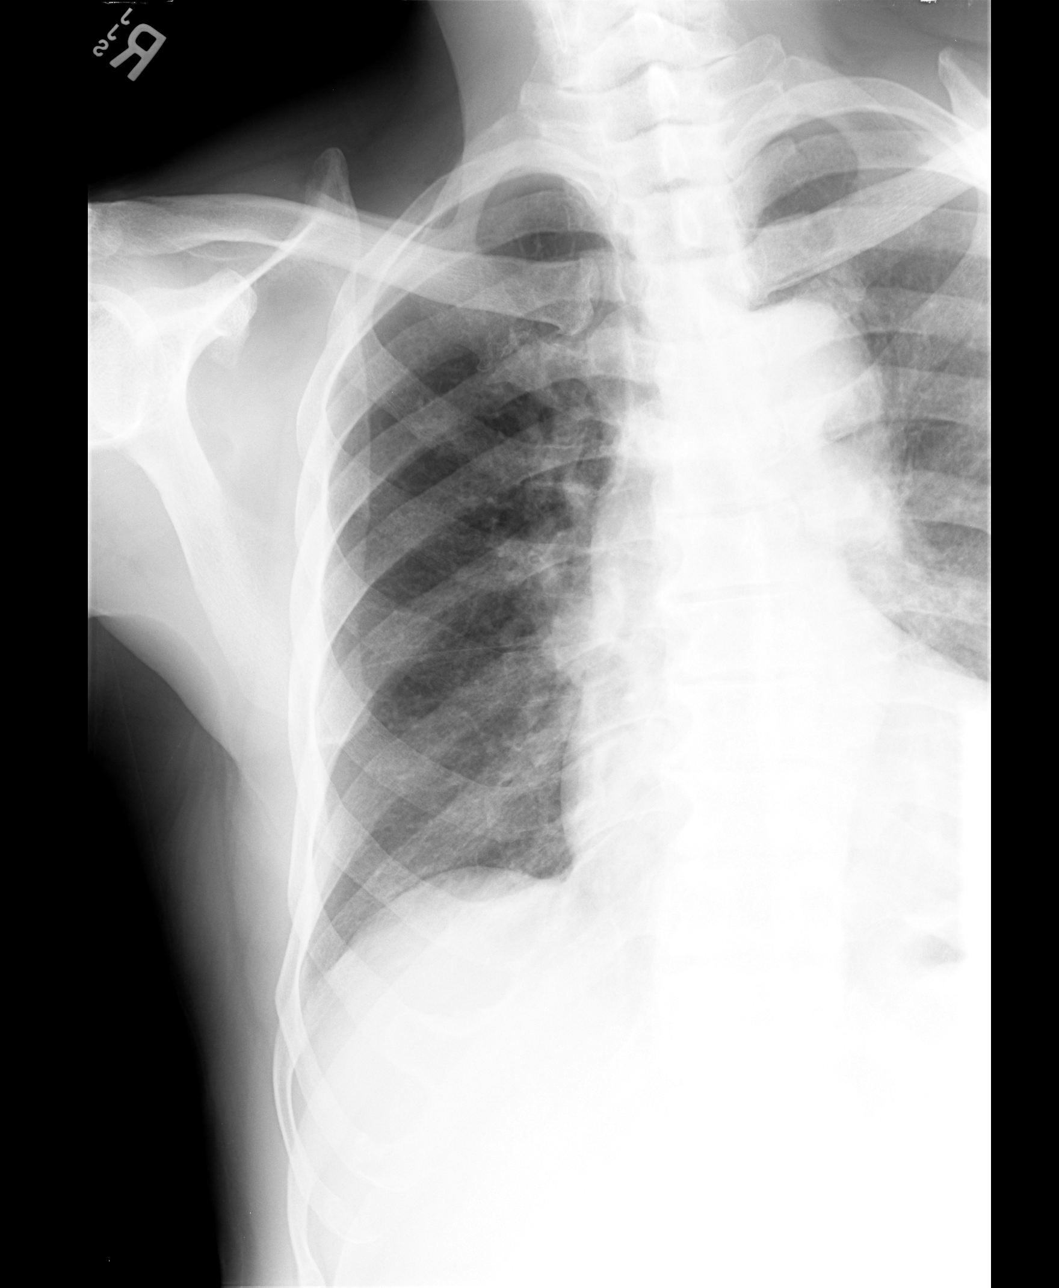

[view not recorded (2 of 4)]
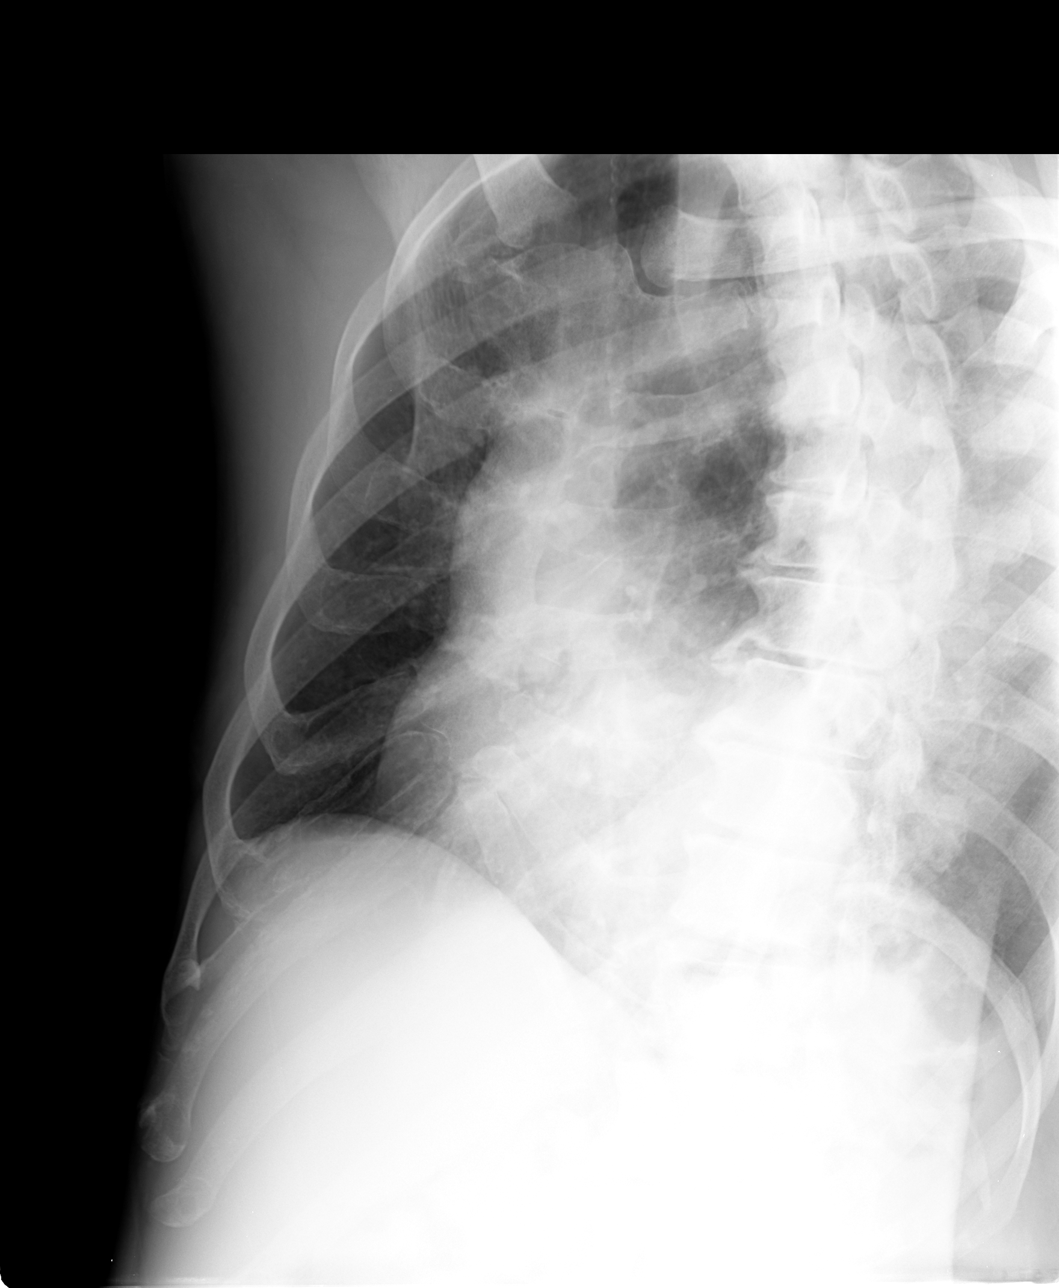

[view not recorded (3 of 4)]
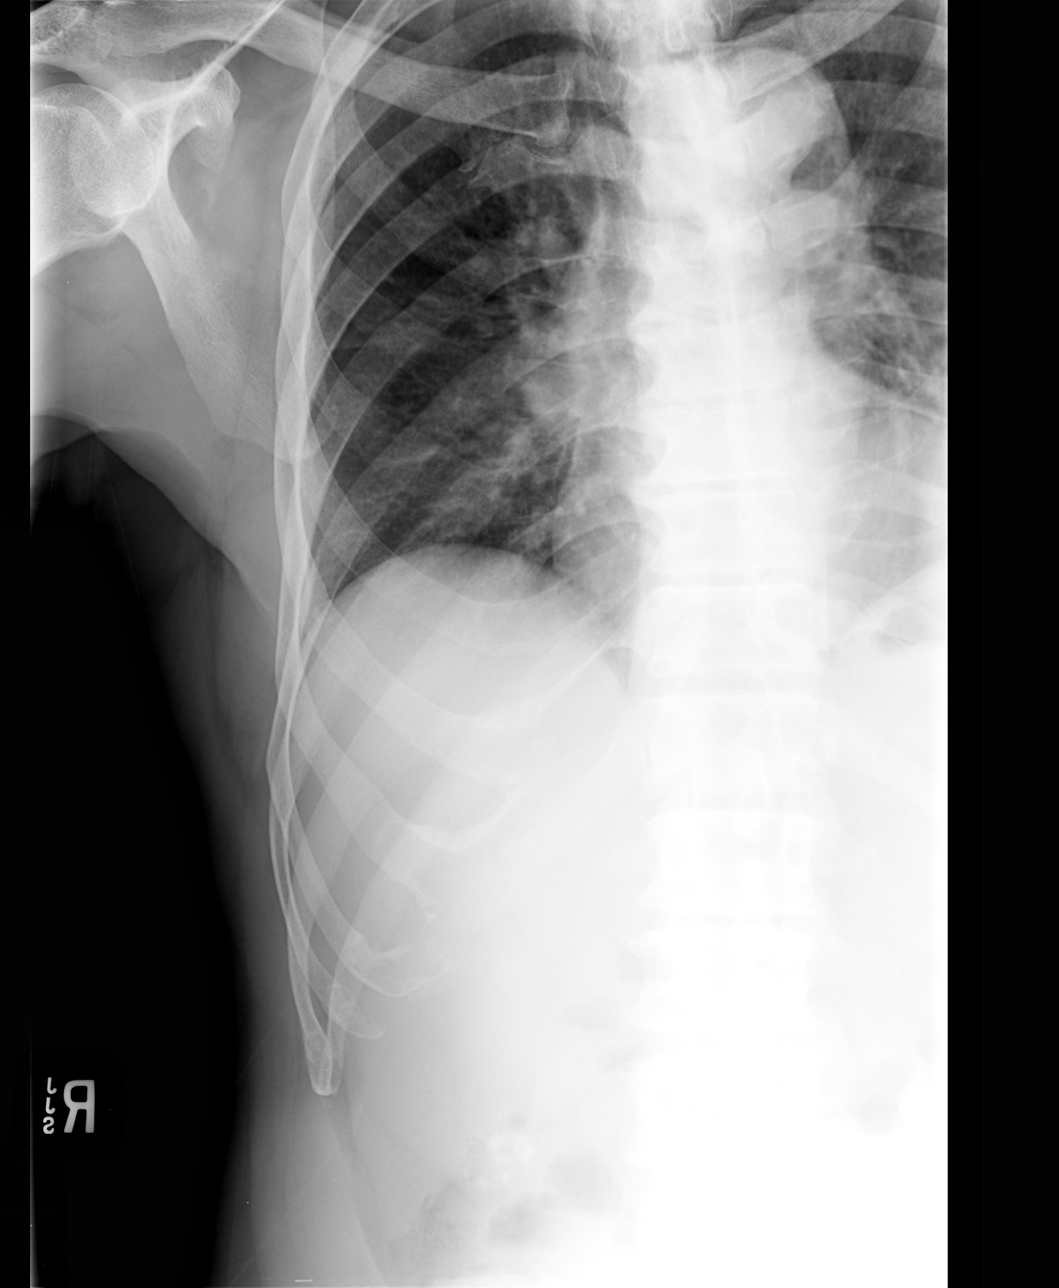

[view not recorded (4 of 4)]
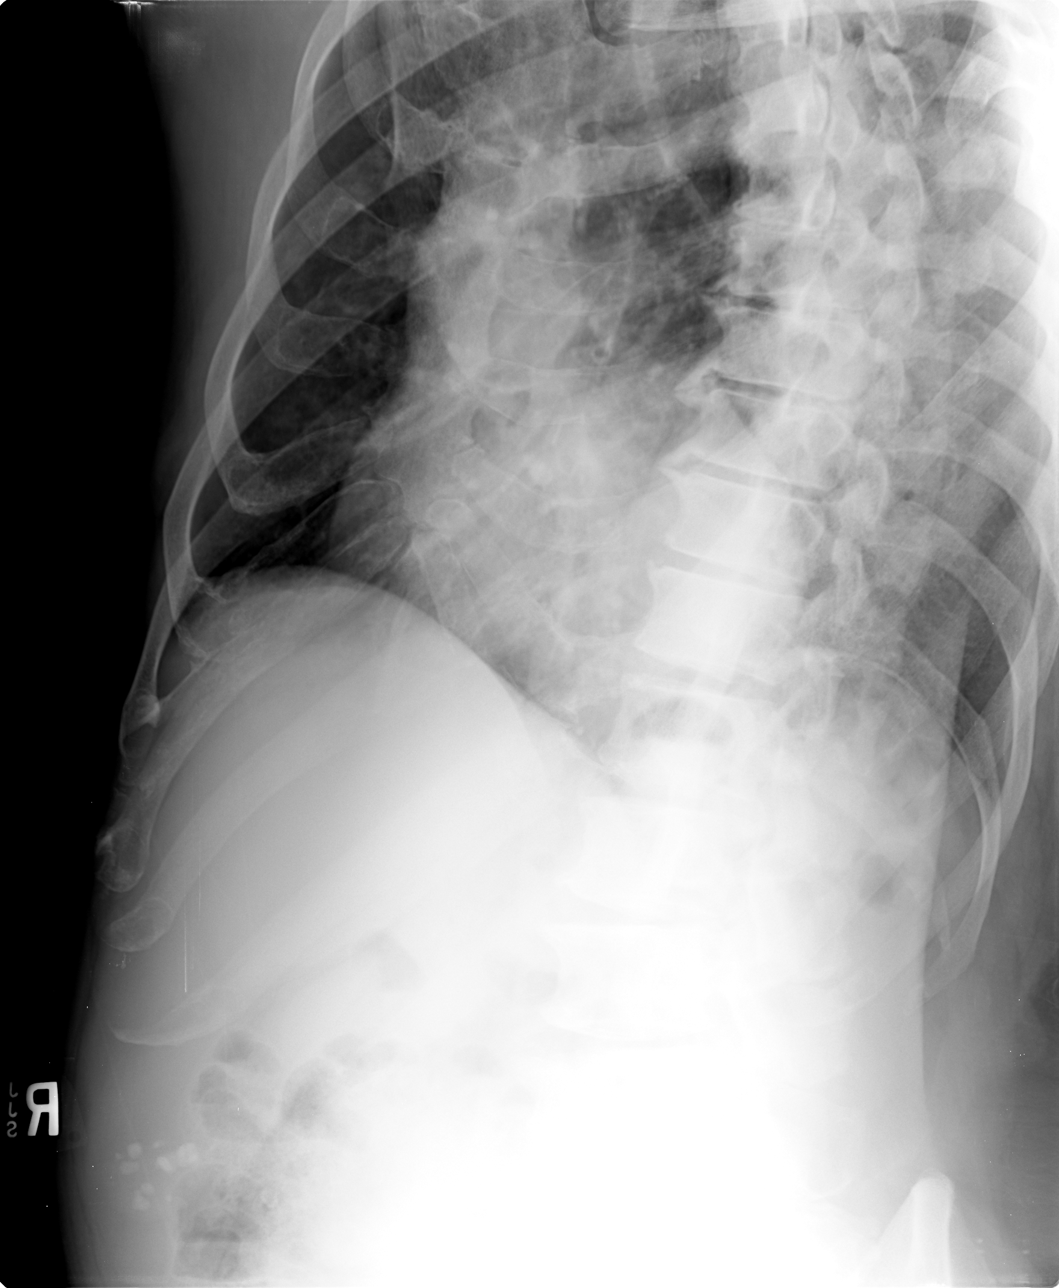

[4 of 4 positions shown; findings below may reference images not displayed]

FINDINGS: No acute bony abnormality.  No evidence of rib fracture
or other abnormality.  Right lung is clear.  No pneumothorax or
effusion.
IMPRESSION: No acute findings.

## 2011-07-16 IMAGING — CR DG CHEST 2V
2 series · 2 of 2 positions shown · non-contrast
Comparison: None

CLINICAL DATA: Right chest wall pain, rib pain.

CHEST - 2 VIEW

[view not recorded (1 of 2)]
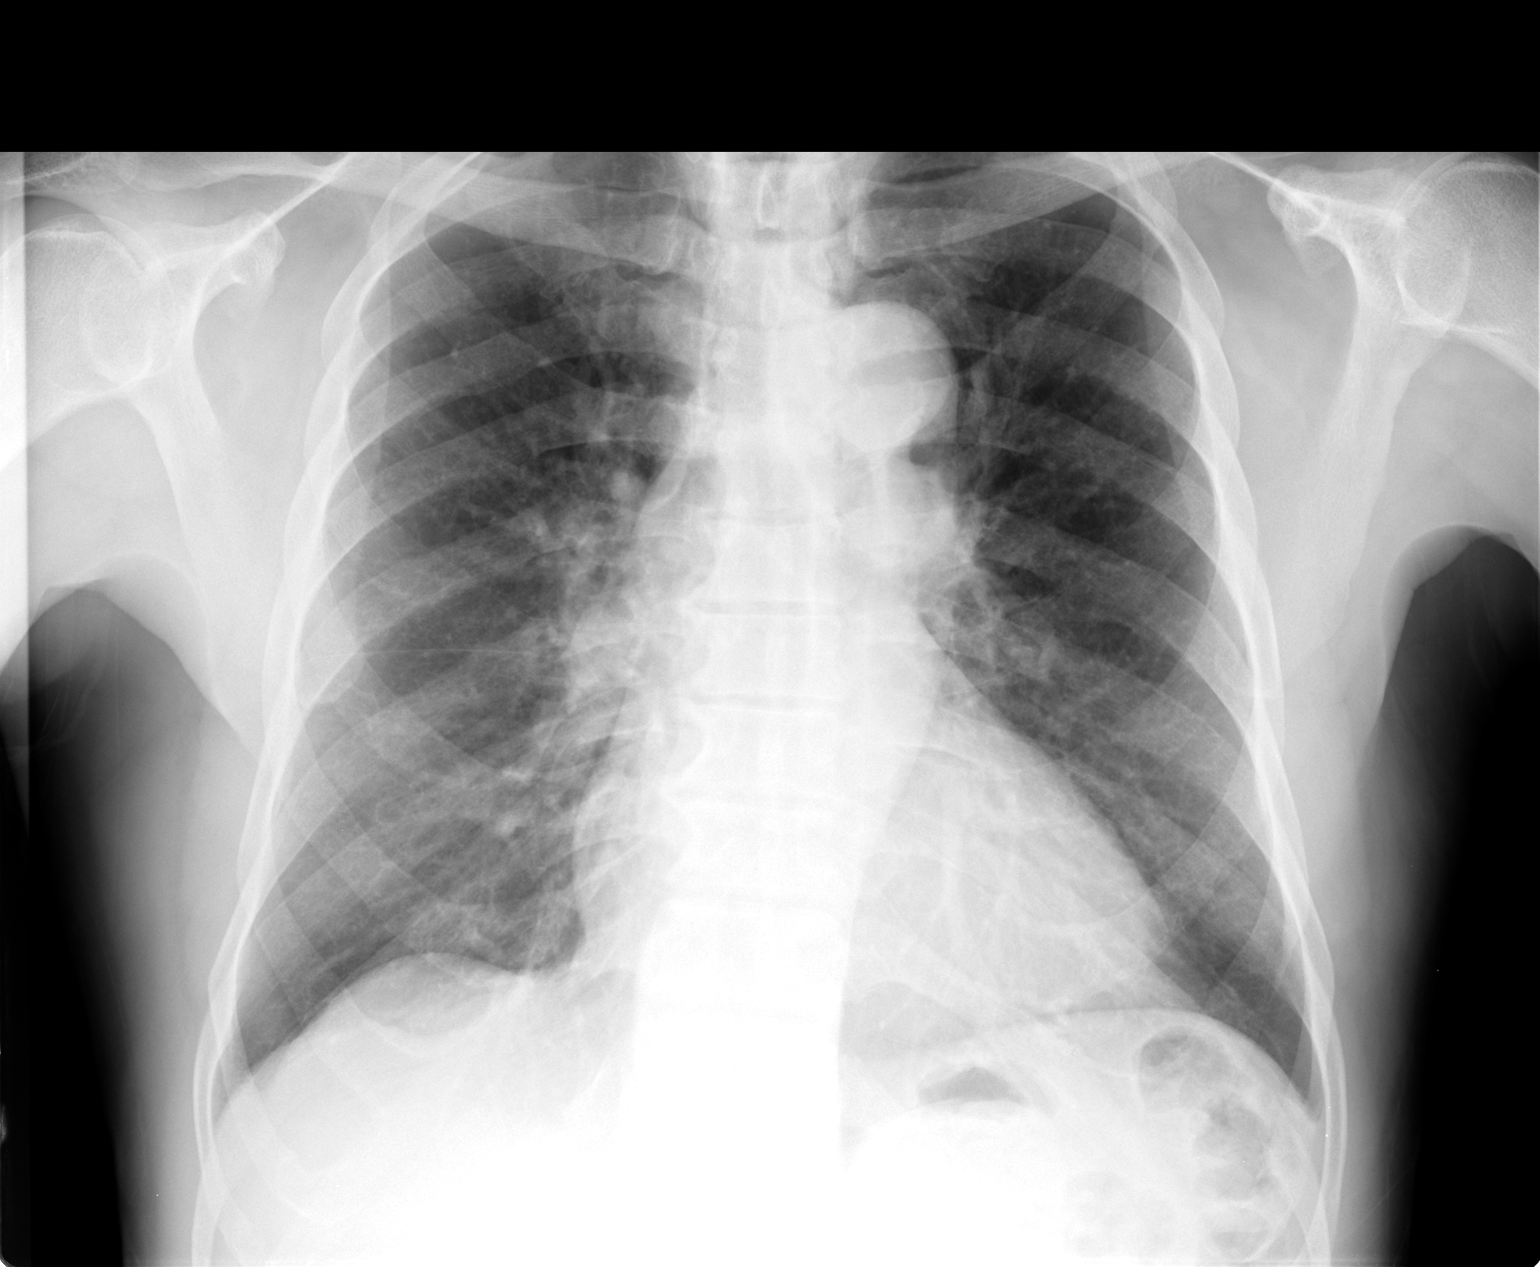

[view not recorded (2 of 2)]
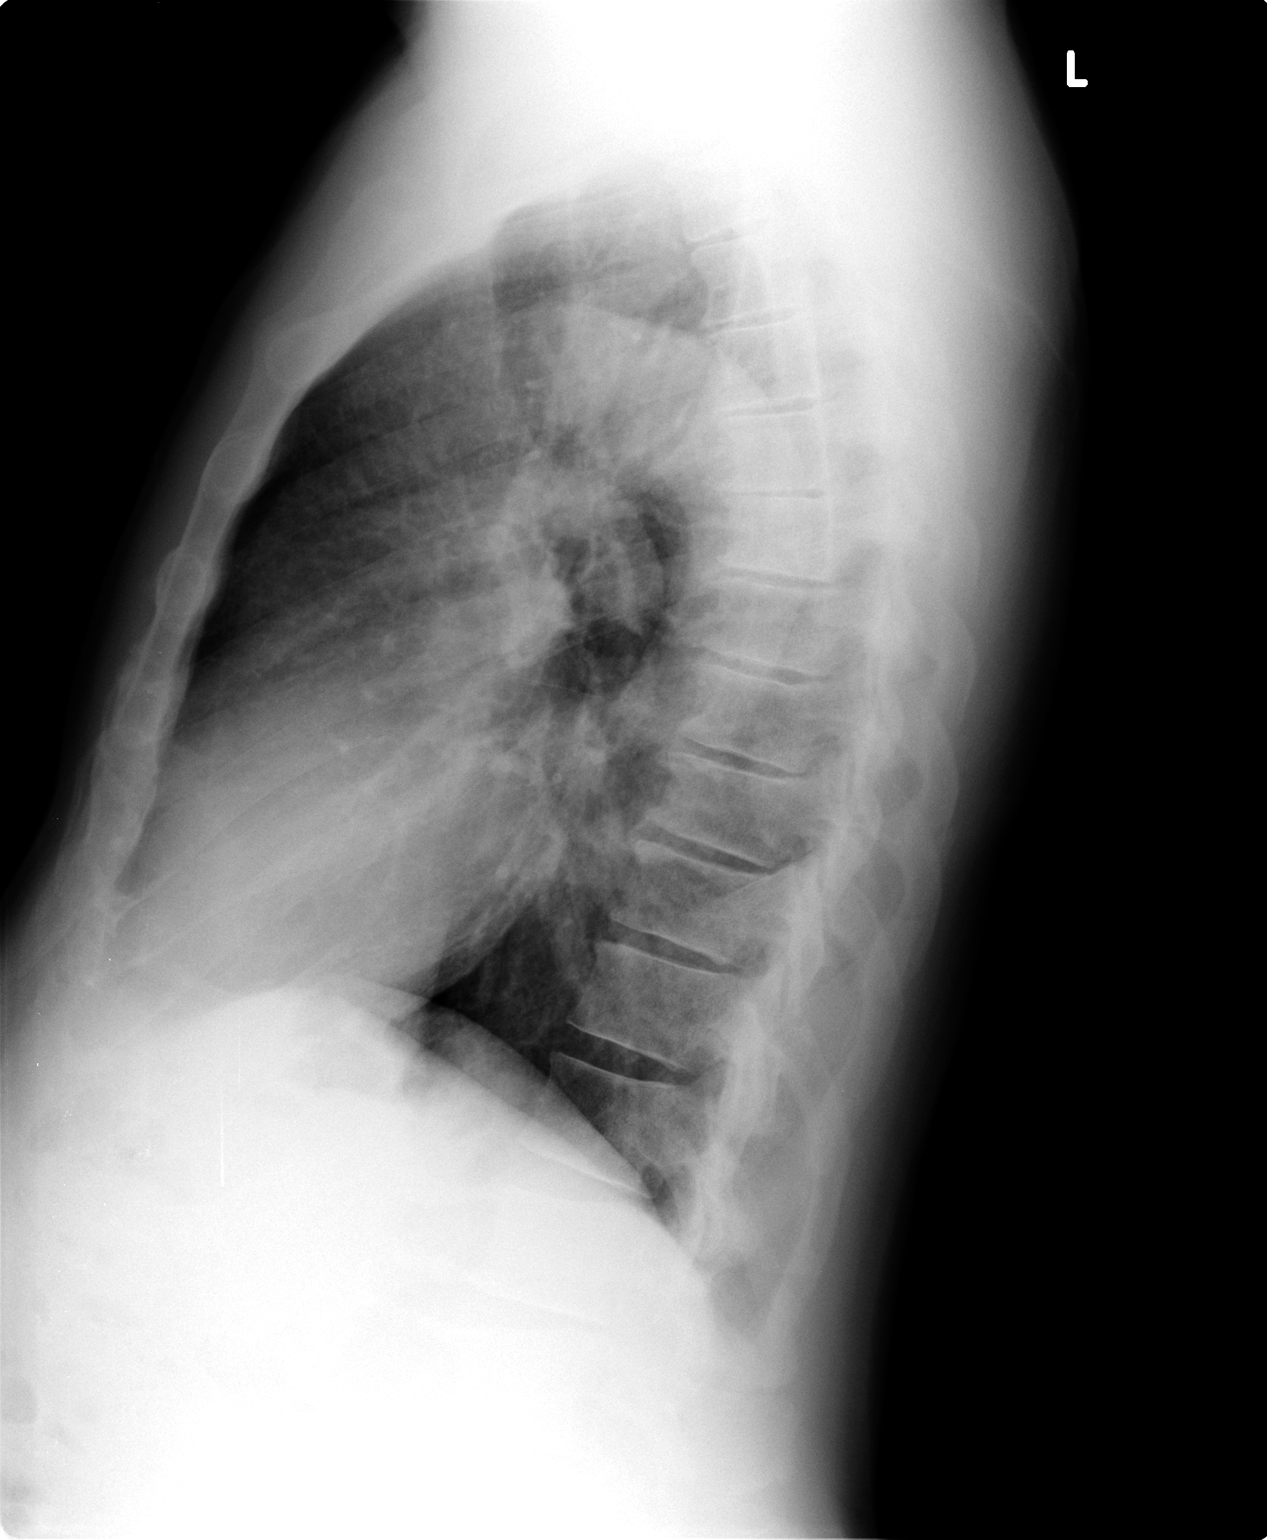

[2 of 2 positions shown; findings below may reference images not displayed]

FINDINGS: Heart is borderline in size.  Mild tortuosity of the
thoracic aorta.  Lungs are clear.  No effusions or edema.  No acute
bony abnormality.  No visible rib abnormality or pneumothorax.
IMPRESSION: No active cardiopulmonary disease.

## 2011-07-16 NOTE — Progress Notes (Signed)
  Subjective:    Patient ID: Isaac Phelps, male    DOB: Jun 17, 1930, 78 y.o.   MRN: [id number]  HPI  78 year old patient who has a history of ongoing tobacco use. He was seen here last week complaining of right lower chest wall pain of about one month's duration. This has not improved. There has been no history of trauma. No weight loss or other constitutional complaints no increased cough or shortness of breath    Review of Systems  Constitutional: Negative for fever, chills, appetite change and fatigue.  HENT: Negative for hearing loss, ear pain, congestion, sore throat, trouble swallowing, neck stiffness, dental problem, voice change and tinnitus.   Eyes: Negative for pain, discharge and visual disturbance.  Respiratory: Negative for cough, chest tightness, wheezing and stridor.   Cardiovascular: Positive for chest pain (right anterior lower chest wall pain). Negative for palpitations and leg swelling.  Gastrointestinal: Negative for nausea, vomiting, abdominal pain, diarrhea, constipation, blood in stool and abdominal distention.  Genitourinary: Negative for urgency, hematuria, flank pain, discharge, difficulty urinating and genital sores.  Musculoskeletal: Negative for myalgias, back pain, joint swelling, arthralgias and gait problem.  Skin: Negative for rash.  Neurological: Negative for dizziness, syncope, speech difficulty, weakness, numbness and headaches.  Hematological: Negative for adenopathy. Does not bruise/bleed easily.  Psychiatric/Behavioral: Negative for behavioral problems and dysphoric mood. The patient is not nervous/anxious.        Objective:   Physical Exam  Constitutional: He is oriented to person, place, and time. He appears well-developed.  HENT:  Head: Normocephalic.  Right Ear: External ear normal.  Left Ear: External ear normal.  Eyes: Conjunctivae and EOM are normal.  Neck: Normal range of motion.  Cardiovascular: Normal rate and normal heart sounds.     Pulmonary/Chest: Effort normal and breath sounds normal.       A few basilar crackles on the right  No chest wall tenderness  Abdominal: Soft. Bowel sounds are normal. He exhibits no distension and no mass. There is no tenderness. There is no rebound and no guarding.  Musculoskeletal: Normal range of motion. He exhibits no edema and no tenderness.  Neurological: He is alert and oriented to person, place, and time.  Psychiatric: He has a normal mood and affect. His behavior is normal.          Assessment & Plan:   Chest wall pain. We'll check a chest x-ray and right rib series. We'll continue symptomatic treatment If radiographs are negative we'll consider a abdominal ultrasound

## 2011-07-16 NOTE — Patient Instructions (Signed)
X-rays as discussed  Smoking tobacco is very bad for your health. You should stop smoking immediately.  Call or return to clinic prn if these symptoms worsen or fail to improve as anticipated.

## 2011-07-17 ENCOUNTER — Telehealth: Payer: Self-pay

## 2011-07-17 NOTE — Telephone Encounter (Signed)
Opened in error

## 2011-07-17 NOTE — Progress Notes (Signed)
Quick Note:  Attempt to call - home# - no ans no mach. No other numbers listed. Will try later ______

## 2011-07-17 NOTE — Progress Notes (Signed)
Quick Note:  Spoke with wife - pt not home at this time - informed cxr normal - no problems ______

## 2011-07-25 ENCOUNTER — Encounter: Payer: Self-pay | Admitting: Internal Medicine

## 2011-07-25 ENCOUNTER — Ambulatory Visit (INDEPENDENT_AMBULATORY_CARE_PROVIDER_SITE_OTHER): Payer: Medicare Other | Admitting: Internal Medicine

## 2011-07-25 DIAGNOSIS — F17200 Nicotine dependence, unspecified, uncomplicated: Secondary | ICD-10-CM

## 2011-07-25 DIAGNOSIS — I10 Essential (primary) hypertension: Secondary | ICD-10-CM

## 2011-07-25 DIAGNOSIS — R0789 Other chest pain: Secondary | ICD-10-CM

## 2011-07-25 DIAGNOSIS — R071 Chest pain on breathing: Secondary | ICD-10-CM

## 2011-07-25 NOTE — Progress Notes (Signed)
  Subjective:    Patient ID: Isaac Phelps, male    DOB: 1930-09-28, 78 y.o.   MRN: [id number]  HPI  78 year old patient who is seen today complaining of persistent right anterior lower chest wall pain. The pain has been present since the first of the year. Pain is not aggravated by movement or activities but he does note some local tenderness to palpation primarily at the right lower mid costal margin. Chest x-ray and rib series were obtained earlier and were negative. He is using anti-inflammatory medications with little benefit. He is a tobacco user.    Review of Systems  Cardiovascular: Positive for chest pain.       Objective:   Physical Exam  Constitutional: He is oriented to person, place, and time. He appears well-developed.  HENT:  Head: Normocephalic.  Right Ear: External ear normal.  Left Ear: External ear normal.  Eyes: Conjunctivae and EOM are normal.  Neck: Normal range of motion.  Cardiovascular: Normal rate and normal heart sounds.   Pulmonary/Chest: Breath sounds normal.       There is tenderness along the right lower costal margin. This does reproduce his pain GI examination is unremarkable  Abdominal: Bowel sounds are normal.  Musculoskeletal: Normal range of motion. He exhibits no edema and no tenderness.  Neurological: He is alert and oriented to person, place, and time.  Psychiatric: He has a normal mood and affect. His behavior is normal.          Assessment & Plan:   Right lower chest wall pain. The situation was discussed at length. I believe the yield of further diagnostic studies his extremely low. The patient wishes to observe for a period of time before a CT scan is considered. We'll continue anti-inflammatory medications and avoid overuse activities. Total cessation of smoking.

## 2011-08-02 ENCOUNTER — Ambulatory Visit (INDEPENDENT_AMBULATORY_CARE_PROVIDER_SITE_OTHER): Payer: Medicare Other | Admitting: Internal Medicine

## 2011-08-02 ENCOUNTER — Encounter: Payer: Self-pay | Admitting: Internal Medicine

## 2011-08-02 DIAGNOSIS — R0789 Other chest pain: Secondary | ICD-10-CM | POA: Insufficient documentation

## 2011-08-02 DIAGNOSIS — F17200 Nicotine dependence, unspecified, uncomplicated: Secondary | ICD-10-CM

## 2011-08-02 DIAGNOSIS — R071 Chest pain on breathing: Secondary | ICD-10-CM

## 2011-08-02 MED ORDER — DICLOFENAC SODIUM 1 % TD GEL: 1.0000 "application " | Freq: Four times a day (QID) | TRANSDERMAL | Status: DC

## 2011-08-02 NOTE — Progress Notes (Signed)
  Subjective:    Patient ID: Isaac Phelps, male    DOB: March 01, 1931, 78 y.o.   MRN: [id number]  HPI  78 year old patient who was seen in followup complaint of persistent right lower anterior chest wall pain. He has had a number of prior office visits. Prior evaluation has included a chest x-ray as well as rib series revealed no pathologic he describes a fairly constant pain that comes and goes. The pain occurs throughout the day and at night. There are no activities that seem to be precipitating factors and he enjoys a very sedentary lifestyle. His history is a bit consistent at times he states that the pain is aggravated by pressure over the right lower mid costal margin and at times he states that he obtains benefit by applying gentle pressure over this region. He does have a history of tobacco use. No constitutional complaints.  He has been using anti-inflammatory medications.  Wt Readings from Last 3 Encounters:  08/02/11 186 lb (84.369 kg)  07/25/11 183 lb (83.008 kg)  07/16/11 183 lb (83.008 kg)      Review of Systems  Cardiovascular: Positive for chest pain.       Objective:   Physical Exam  Constitutional: He appears well-developed and well-nourished.  Pulmonary/Chest: Effort normal and breath sounds normal. He exhibits no tenderness.  Abdominal: Soft. Bowel sounds are normal. He exhibits no distension and no mass. There is no tenderness. There is no guarding.          Assessment & Plan:    Persistent right anterior chest wall pain.  We'll proceed with a chest CT which should provide good visualization of the right upper quadrant and liver. Try   topical diclofenac

## 2011-08-02 NOTE — Patient Instructions (Signed)
Chest CT scan as discussed  Apply  topical gel 4 times daily

## 2011-08-07 ENCOUNTER — Ambulatory Visit (INDEPENDENT_AMBULATORY_CARE_PROVIDER_SITE_OTHER)
Admission: RE | Admit: 2011-08-07 | Discharge: 2011-08-07 | Disposition: A | Discharge status: Home / Self Care | Payer: Medicare Other | Source: Ambulatory Visit | Attending: Internal Medicine | Admitting: Internal Medicine

## 2011-08-07 DIAGNOSIS — R0789 Other chest pain: Secondary | ICD-10-CM

## 2011-08-07 DIAGNOSIS — R071 Chest pain on breathing: Secondary | ICD-10-CM

## 2011-08-07 DIAGNOSIS — F17200 Nicotine dependence, unspecified, uncomplicated: Secondary | ICD-10-CM

## 2011-08-07 IMAGING — CT CT CHEST W/O CM
2 of 4 series · 15 of 36 positions shown, 18 images · non-contrast
Comparison: Rib radiographs from [DATE]

CLINICAL DATA: Right rib soreness.  Rib radiographs were negative.

CT CHEST WITHOUT CONTRAST
TECHNIQUE: Multidetector CT imaging of the chest was performed
following the standard protocol without IV contrast.

[Series 2: chest routine with · axial · 0.71mm/px · z∈[-280,-4]mm · 12 of 65 slices shown, 15 images]
[im 5/65  mediastinal]
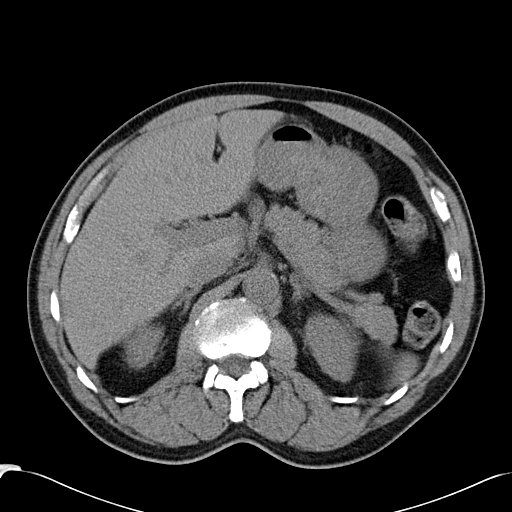
[im 5/65  lung]
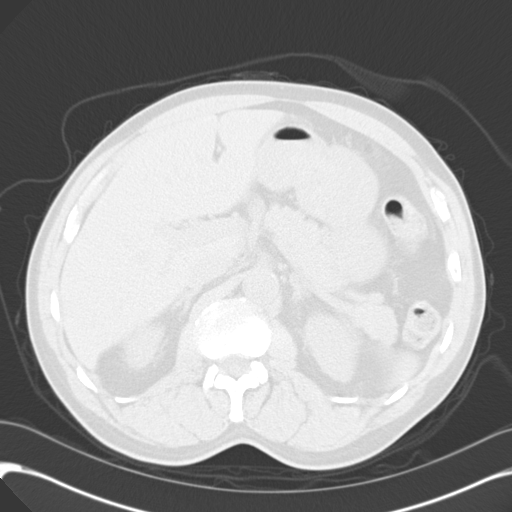
[im 10/65  lung]
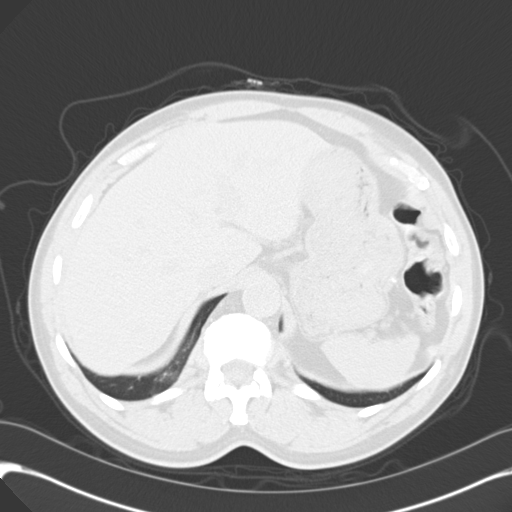
[im 15/65  lung]
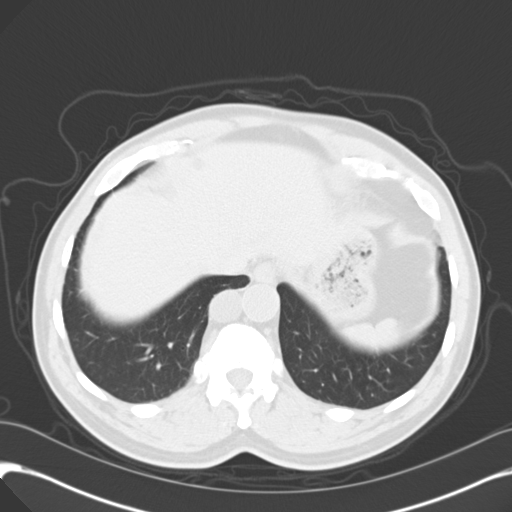
[im 20/65  lung]
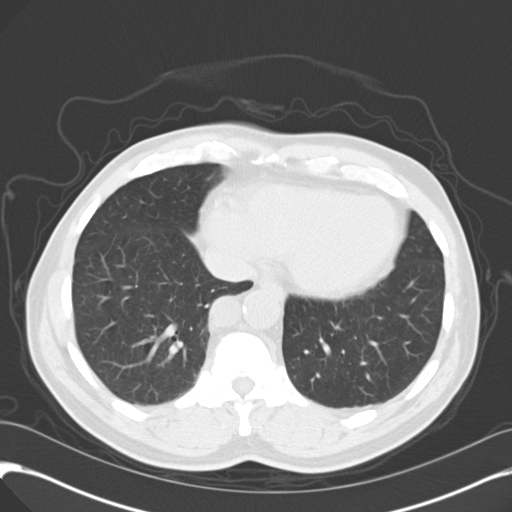
[im 25/65  mediastinal]
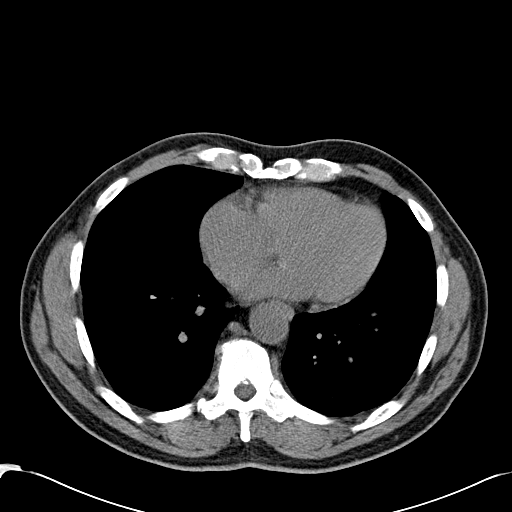
[im 25/65  lung]
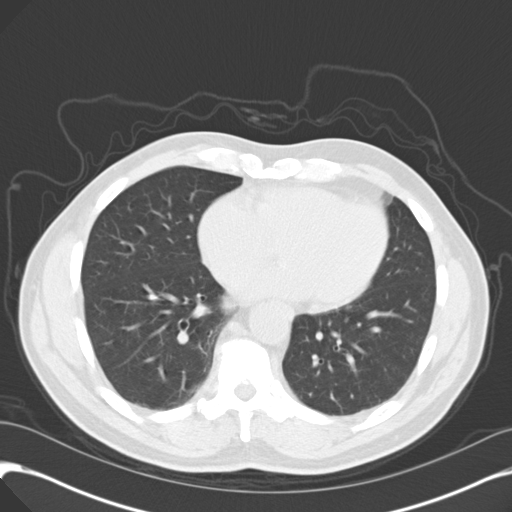
[im 30/65  lung]
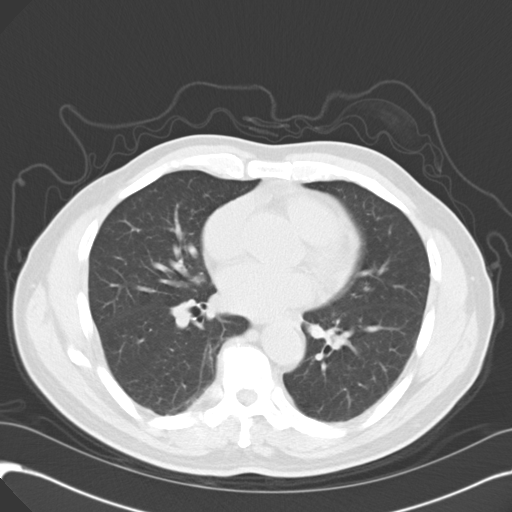
[im 35/65  lung]
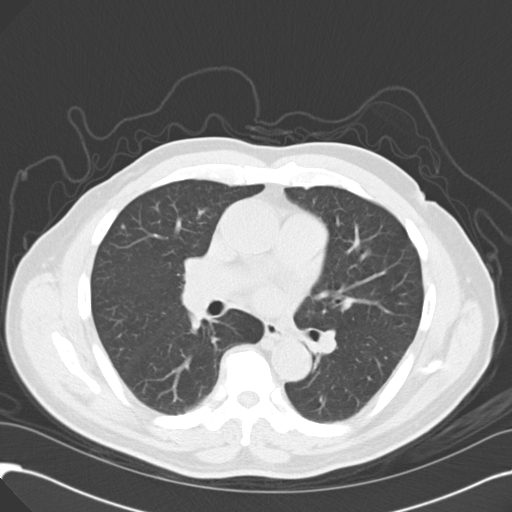
[im 40/65  lung]
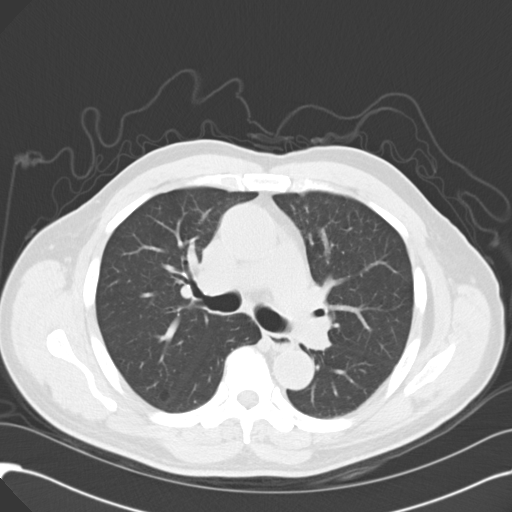
[im 45/65  mediastinal]
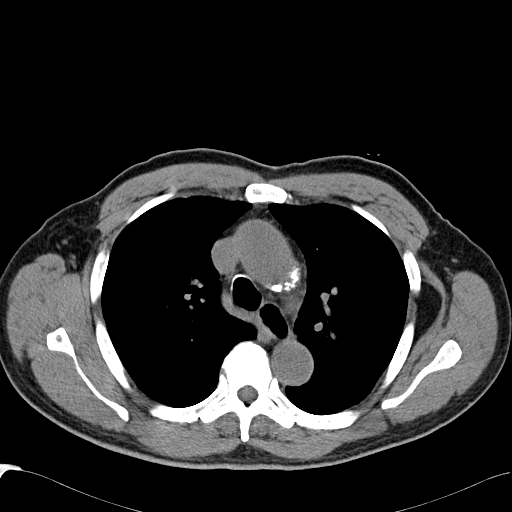
[im 45/65  lung]
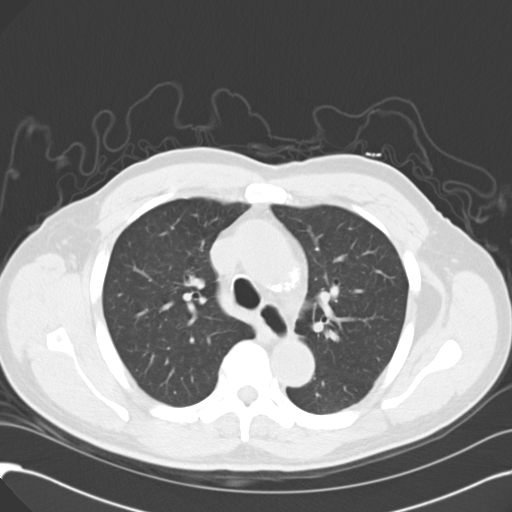
[im 50/65  lung]
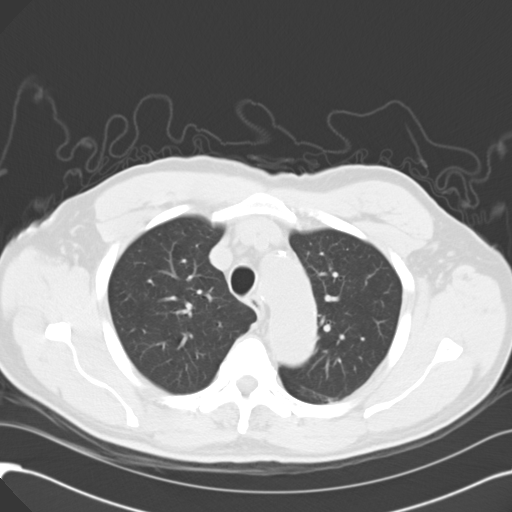
[im 55/65  lung]
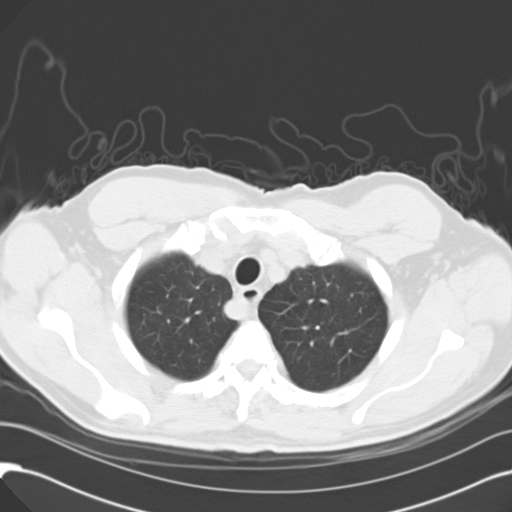
[im 60/65  lung]
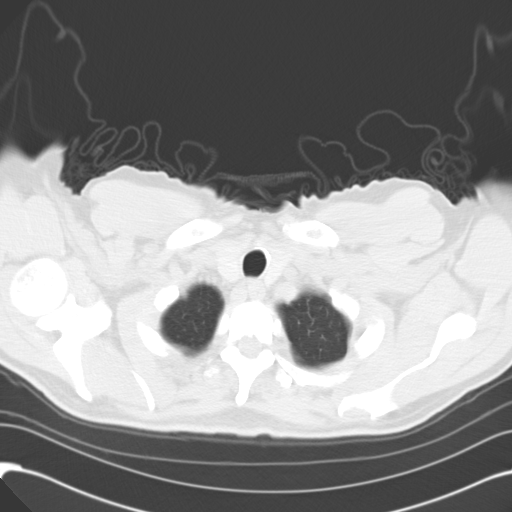

[Series 602: cor · coronal · 0.71mm/px · 3 of 100 slices shown]
[im 20/100  lung]
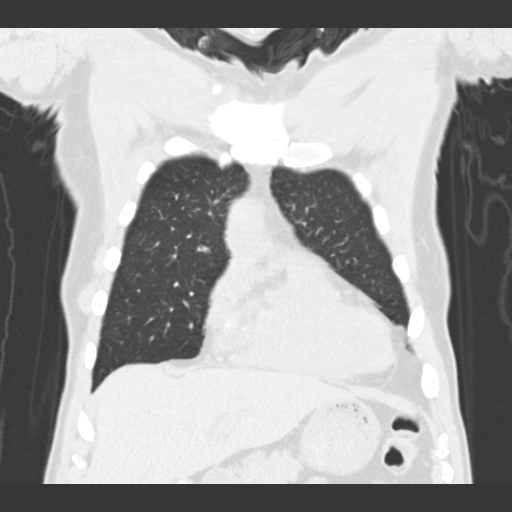
[im 40/100  lung]
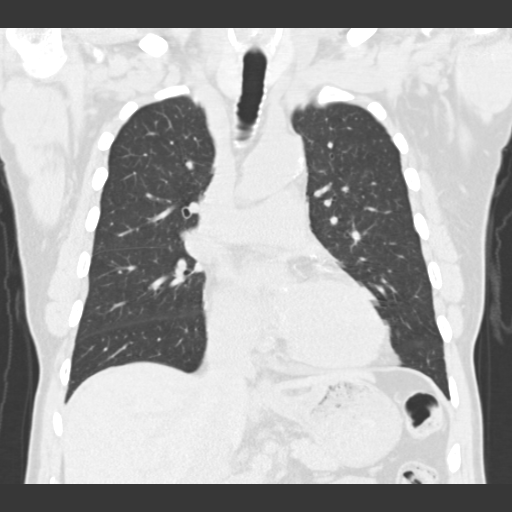
[im 60/100  lung]
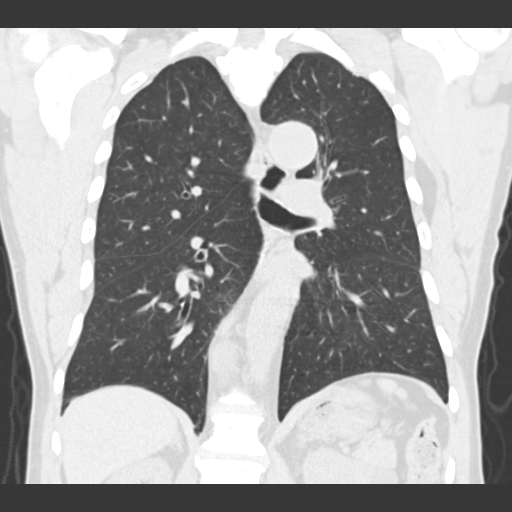

[15 of 36 positions shown; findings below may reference images not displayed]

FINDINGS: No significant rib abnormality is observed.

Aberrant right subclavian artery passes behind the esophagus.
Coronary artery atherosclerosis noted.

2.5 x 2.3 cm lesion to the right of the descending aorta has fluid
density characteristics.

The bottom most image suggests possible gallstones and a left
kidney upper pole cystic lesion which is only partially imaged and
accordingly is nonspecific.  Several nonspecific scattered
hypodense lesions are present in the dome of the liver.

A focus of subsolid density and interstitial accentuation is noted
in the superior segment left lower lobe, measuring 1.1 cm.

Linear calcific density in the right upper lobe on image 24 of
series 3 is thought to be incidental.

Filling defect in the left mainstem bronchus most likely reflects
mucous.

Several tiny subpleural lymph nodes are present along the minor
fissure.

Thoracic spondylosis noted.
IMPRESSION: 1.  No discrete rib lesion is observed.
2.  Indistinct subsolid density in the superior segment left lower
lobe could reflect an inflammatory focus, but low-grade
adenocarcinoma cannot be excluded.  Initial follow-up by chest CT
without contrast is recommended in 3 months to confirm persistence.
This recommendation follows the consensus statement:
Recommendations for the Management of Subsolid Pulmonary Nodules
Detected at CT:  A Statement from the [HOSPITAL] as
published in Radiology [[facility]]; [DATE].
3.  Probable gallstones.
4.  Partially imaged left kidney upper pole cystic lesion,
nonspecific.
5.  Coronary artery atherosclerosis.
6.  Aberrant right subclavian artery can be a cause for dysphagia
lusoria.

7.  Thoracic spondylosis.
8.  Hypodense liver lesions are technically nonspecific, although
statistically most likely to represent small cysts.
9.  Fluid density 2.5 cm lesion adjacent to the descending thoracic
aorta.  I favor bronchogenic cyst or esophageal duplication cyst.

## 2011-08-08 ENCOUNTER — Other Ambulatory Visit: Payer: Self-pay | Admitting: Internal Medicine

## 2011-08-08 DIAGNOSIS — R9389 Abnormal findings on diagnostic imaging of other specified body structures: Secondary | ICD-10-CM

## 2011-08-08 DIAGNOSIS — R109 Unspecified abdominal pain: Secondary | ICD-10-CM

## 2011-08-08 NOTE — Progress Notes (Signed)
Quick Note:  Spoke with pt - informed of results and need for additional testing - terri will call once scheduled ______

## 2011-08-21 ENCOUNTER — Encounter (HOSPITAL_COMMUNITY)
Admission: RE | Admit: 2011-08-21 | Discharge: 2011-08-21 | Disposition: A | Discharge status: Home / Self Care | Payer: Medicare Other | Source: Ambulatory Visit | Attending: Internal Medicine | Admitting: Internal Medicine

## 2011-08-21 DIAGNOSIS — R109 Unspecified abdominal pain: Secondary | ICD-10-CM

## 2011-08-21 DIAGNOSIS — R9389 Abnormal findings on diagnostic imaging of other specified body structures: Secondary | ICD-10-CM

## 2011-08-28 ENCOUNTER — Encounter (HOSPITAL_COMMUNITY)
Admission: RE | Admit: 2011-08-28 | Discharge: 2011-08-28 | Disposition: A | Discharge status: Home / Self Care | Payer: Medicare Other | Source: Ambulatory Visit | Attending: Internal Medicine | Admitting: Internal Medicine

## 2011-08-28 DIAGNOSIS — K828 Other specified diseases of gallbladder: Secondary | ICD-10-CM | POA: Insufficient documentation

## 2011-08-28 DIAGNOSIS — R1011 Right upper quadrant pain: Secondary | ICD-10-CM | POA: Insufficient documentation

## 2011-08-28 IMAGING — NM NM HEPATO W/GB/PHARM/[PERSON_NAME]
1 series · 12 of 12 positions shown · non-contrast
Comparison: No prior nuclear imaging.  CT chest [DATE] [REDACTED].

CLINICAL DATA: Right upper quadrant abdominal pain.
Cholelithiasis on recent CT.

NUCLEAR MEDICINE HEPATOBILIARY IMAGING WITH GALLBLADDER EF
[DATE]:
TECHNIQUE: Sequential images of the abdomen were obtained [DATE] minutes following intravenous administration of
radiopharmaceutical.  After slow intravenous infusion of
micrograms Cholecystokinin, gallbladder ejection fraction was
determined.
Radiopharmaceutical:  5.3 mCi [5E] Choletec

[Series 1: hepato · 4.46mm/px · 2 acquisitions, 12 frames shown]
[im 1/2]
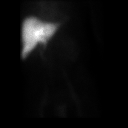
[im 1/2]
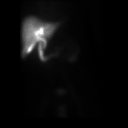
[im 1/2]
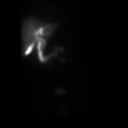
[im 1/2]
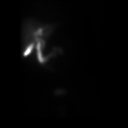
[im 1/2]
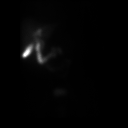
[im 1/2]
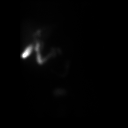
[im 2/2]
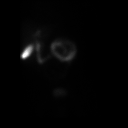
[im 2/2]
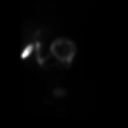
[im 2/2]
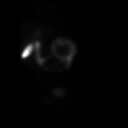
[im 2/2]
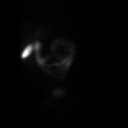
[im 2/2]
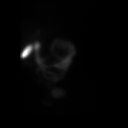
[im 2/2]
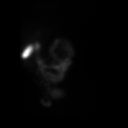

[12 of 12 positions shown; findings below may reference images not displayed]

FINDINGS: Hepatic uptake of the radiopharmaceutical is normal.
Intra and extrahepatic bile ducts are visible within 10 minutes.
Duodenal activity is identified [5E] minutes.  Gallbladder
activity is visualized within 15 minutes, with normal filling of
the gallbladder throughout the remainder of the imaging sequence.
No evidence of tracer retention by the liver.

Gallbladder ejection fraction measured 13.3% at 30 minutes (normal
is greater than 30%).

The patient did not experience symptoms during CCK infusion.
IMPRESSION: 1.  Biliary dyskinesis, with diminished gallbladder ejection
fraction of 13.3% at 30 minutes.
2.  No evidence of common bile duct or cystic duct obstruction.

## 2011-08-28 MED ORDER — TECHNETIUM TC 99M MEBROFENIN IV KIT
5.3000 | PACK | Freq: Once | INTRAVENOUS | Status: AC | PRN
  Administered 2011-08-28: 5 via INTRAVENOUS

## 2011-09-04 ENCOUNTER — Telehealth: Payer: Self-pay | Admitting: Internal Medicine

## 2011-09-04 NOTE — Telephone Encounter (Signed)
Spoke with pt - we discussed last week results - he states pain is alittle better - will let me know if it gets worse or has fever. Must see at that time. KIK

## 2011-09-04 NOTE — Telephone Encounter (Signed)
Pt went to [facility] ?abd ultrasound. Pt would like results

## 2012-05-17 ENCOUNTER — Other Ambulatory Visit: Payer: Self-pay | Admitting: Internal Medicine

## 2012-05-19 ENCOUNTER — Ambulatory Visit: Payer: Medicare Other | Admitting: Internal Medicine

## 2012-05-20 ENCOUNTER — Encounter: Payer: Self-pay | Admitting: Internal Medicine

## 2012-05-20 ENCOUNTER — Ambulatory Visit (INDEPENDENT_AMBULATORY_CARE_PROVIDER_SITE_OTHER): Payer: Medicare Other | Admitting: Internal Medicine

## 2012-05-20 VITALS — BP 150/80 | HR 75 | Temp 98.1°F | Resp 18 | Ht 72.0 in | Wt 185.0 lb

## 2012-05-20 DIAGNOSIS — F17200 Nicotine dependence, unspecified, uncomplicated: Secondary | ICD-10-CM

## 2012-05-20 DIAGNOSIS — I10 Essential (primary) hypertension: Secondary | ICD-10-CM

## 2012-05-20 DIAGNOSIS — Z0000 Encounter for general adult medical examination without abnormal findings: Secondary | ICD-10-CM

## 2012-05-20 DIAGNOSIS — E785 Hyperlipidemia, unspecified: Secondary | ICD-10-CM

## 2012-05-20 DIAGNOSIS — N400 Enlarged prostate without lower urinary tract symptoms: Secondary | ICD-10-CM

## 2012-05-20 LAB — CBC WITH DIFFERENTIAL/PLATELET
Basophils Absolute: 0 10*3/uL (ref 0.0–0.1)
Basophils Relative: 0.6 % (ref 0.0–3.0)
Eosinophils Absolute: 0.2 10*3/uL (ref 0.0–0.7)
HCT: 40 % (ref 39.0–52.0)
Hemoglobin: 13 g/dL (ref 13.0–17.0)
Lymphs Abs: 1.9 10*3/uL (ref 0.7–4.0)
MCHC: 32.6 g/dL (ref 30.0–36.0)
MCV: 82.8 fl (ref 78.0–100.0)
Neutro Abs: 4.6 10*3/uL (ref 1.4–7.7)
RBC: 4.83 Mil/uL (ref 4.22–5.81)
RDW: 14.9 % — ABNORMAL HIGH (ref 11.5–14.6)

## 2012-05-20 LAB — COMPREHENSIVE METABOLIC PANEL
ALT: 17 U/L (ref 0–53)
AST: 45 U/L — ABNORMAL HIGH (ref 0–37)
Alkaline Phosphatase: 73 U/L (ref 39–117)
Chloride: 101 mEq/L (ref 96–112)
Creatinine, Ser: 1.5 mg/dL (ref 0.4–1.5)
Total Bilirubin: 0.7 mg/dL (ref 0.3–1.2)

## 2012-05-20 LAB — LIPID PANEL
HDL: 34.1 mg/dL — ABNORMAL LOW (ref >39.00)
LDL Cholesterol: 131 mg/dL — ABNORMAL HIGH (ref 0–99)
Total CHOL/HDL Ratio: 5
Triglycerides: 84 mg/dL (ref 0.0–149.0)
VLDL: 16.8 mg/dL (ref 0.0–40.0)

## 2012-05-20 MED ORDER — LISINOPRIL-HYDROCHLOROTHIAZIDE 20-25 MG PO TABS: 1.0000 | ORAL_TABLET | Freq: Every day | ORAL | Status: DC

## 2012-05-20 MED ORDER — DICLOFENAC SODIUM 1 % TD GEL: 1.0000 "application " | Freq: Four times a day (QID) | TRANSDERMAL | Status: DC

## 2012-05-20 MED ORDER — SIMVASTATIN 40 MG PO TABS: 40.0000 mg | ORAL_TABLET | Freq: Every day | ORAL | Status: DC

## 2012-05-20 NOTE — Patient Instructions (Signed)
Limit your sodium (Salt) intake    It is important that you exercise regularly, at least 20 minutes 3 to 4 times per week.  If you develop chest pain or shortness of breath seek  medical attention.  Please check your blood pressure on a regular basis.  If it is consistently greater than 150/90, please make an office appointment.  Smoking tobacco is very bad for your health. You should stop smoking immediately.  Return in one year for follow-up  

## 2012-05-20 NOTE — Progress Notes (Signed)
Patient ID: Isaac Phelps, male   DOB: 06-01-1931, 78 y.o.   MRN: [id number]  Subjective:    Patient ID: Isaac Phelps, male    DOB: 1931-03-06, 78 y.o.   MRN: [id number]  Hypertension Pertinent negatives include no chest pain, headaches, neck pain, palpitations or shortness of breath.  Hyperlipidemia Pertinent negatives include no chest pain, myalgias or shortness of breath.   History of Present Illness:   78  year-old patient who is seen today for a comprehensive evaluation. He has a history of treated hypertension and dyslipidemia, and does remarkably well  He was evaluated earlier in the year for a right-sided chest wall pain. This included a chest CT that revealed a nondistended lesion involving the superior aspect of the left lower lobe. A followup CT scan was recommended. This again was discussed with the patient who now is symptom-free he does not wish to proceed with a followup CT scan at this time;  he is aware of the possibility of early lung cancer  Here for Medicare AWV:   1. Risk factors based on Past M, S, F history: cardiovascular risk factors include hypertension, and dyslipidemia. As well as tobacco use 2. 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 Also walks daily  3. Depression/mood: no history of depression or mood disorder  4. Hearing: no deficits  5. ADL's: independent in all aspects of daily living  6. Fall Risk: low  7. Home Safety: no problems identified  8. Height, weight, &visual acuity:height and weight stable has had recent cataract extraction surgery and lens implantation. Very pleased with the outcome  9. Counseling: heart healthy, salt restricted diet. Encouraged  10. Labs ordered based on risk factors: laboratory profile, including lipid panel will be reviewed  11. Referral Coordination- not appropriate at this time ;has declined screening colonoscopies  12. Care Plan- continue, heart healthy diet, exercise regimen   13. Cognitive Assessment- alert and oriented, with normal affect. No history of memory dysfunction. Handles all executive functioning without difficulty   Preventive Screening-Counseling & Management  Alcohol-Tobacco  Smoking Status: current  Smoking Cessation Counseling: yes   Current Problems (verified):   1) Preventive Health Care (ICD-V70.0)  2) Tobacco Abuse (ICD-305.1)  3) Benign Prostatic Hypertrophy (ICD-600.00)  4) Benign Prostatic Hypertrophy, Hx of (ICD-V13.8)  5) Hyperlipidemia (ICD-272.4)  6) Hypertension (ICD-401.9)   Current Medications (verified):  1) Simvastatin 40 Mg Tabs (Simvastatin) .... 1/2 Once Daily  2) Lisinopril-Hydrochlorothiazide 20-25 Mg Tabs (Lisinopril-Hydrochlorothiazide) .... One Daily   Allergies (verified):  No Known Drug Allergies  Contraindications/Deferment of Procedures/Staging:  Test/Procedure: Pneumovax vaccine  Reason for deferment: patient declined   Past History:  Past Medical History:  Reviewed history from 04/01/2008 and no changes required.  Hypertension  Hyperlipidemia  ongoing tobacco use  Benign prostatic hypertrophy   Past Surgical History:  Reviewed history from 07/08/2007 and no changes required.  Tonsillectomy  Arthroscopic Knee surgery  no prior colonoscopy-declines   Family History:  Reviewed history from 05/26/2007 and no changes required.  Family History of Arthritis  Family History Hypertension  Family History of Stroke M 1st degree relative <50  father died age 36 of probable CVA  mother died at age 42  One brother one sister in good health. No family history cancer   Social History:  Reviewed history from 05/26/2007 and no changes required.  Retired works part-time as a Electrical engineer  Married  Current Smoker  one child    Review  of Systems  Constitutional: Negative for fever, chills, activity change, appetite change and fatigue.  HENT: Negative for hearing loss, ear pain, congestion,  rhinorrhea, sneezing, mouth sores, trouble swallowing, neck pain, neck stiffness, dental problem, voice change, sinus pressure and tinnitus.   Eyes: Negative for photophobia, pain, redness and visual disturbance.  Respiratory: Negative for apnea, cough, choking, chest tightness, shortness of breath and wheezing.   Cardiovascular: Negative for chest pain, palpitations and leg swelling.  Gastrointestinal: Negative for nausea, vomiting, abdominal pain, diarrhea, constipation, blood in stool, abdominal distention, anal bleeding and rectal pain.  Genitourinary: Negative for dysuria, urgency, frequency, hematuria, flank pain, decreased urine volume, discharge, penile swelling, scrotal swelling, difficulty urinating, genital sores and testicular pain.  Musculoskeletal: Negative for myalgias, back pain, joint swelling, arthralgias and gait problem.  Skin: Negative for color change, rash and wound.  Neurological: Negative for dizziness, tremors, seizures, syncope, facial asymmetry, speech difficulty, weakness, light-headedness, numbness and headaches.  Hematological: Negative for adenopathy. Does not bruise/bleed easily.  Psychiatric/Behavioral: Negative for suicidal ideas, hallucinations, behavioral problems, confusion, sleep disturbance, self-injury, dysphoric mood, decreased concentration and agitation. The patient is not nervous/anxious.        Objective:   Physical Exam  Constitutional: He appears well-developed and well-nourished.  HENT:  Head: Normocephalic and atraumatic.  Right Ear: External ear normal.  Left Ear: External ear normal.  Nose: Nose normal.  Mouth/Throat: Oropharynx is clear and moist.  Eyes: Conjunctivae normal and EOM are normal. Pupils are equal, round, and reactive to light. No scleral icterus.  Neck: Normal range of motion. Neck supple. No JVD present. No thyromegaly present.  Cardiovascular: Regular rhythm, normal heart sounds and intact distal pulses.  Exam reveals no  gallop and no friction rub.   No murmur heard.      Dorsalis pedis pulses not easily palpable. Posterior tibial pulses full  Pulmonary/Chest: Effort normal and breath sounds normal. He exhibits no tenderness.  Abdominal: Soft. Bowel sounds are normal. He exhibits no distension and no mass. There is no tenderness.       Bilateral femoral hernias  Genitourinary: Prostate normal. Guaiac negative stool.       Prostate +3  Musculoskeletal: Normal range of motion. He exhibits no edema and no tenderness.       Flat feet  Lymphadenopathy:    He has no cervical adenopathy.  Neurological: He is alert. He has normal reflexes. No cranial nerve deficit. Coordination normal.  Skin: Skin is warm and dry. No rash noted.  Psychiatric: He has a normal mood and affect. His behavior is normal.          Assessment & Plan:   Preventive health Hypertension stable BPH Dyslipidemia Ongoing tobacco use  Laboratory update will be obtained Medications refilled Smoking cessation encouraged Recheck one year

## 2012-07-16 ENCOUNTER — Encounter: Payer: Self-pay | Admitting: Internal Medicine

## 2012-07-16 ENCOUNTER — Ambulatory Visit (INDEPENDENT_AMBULATORY_CARE_PROVIDER_SITE_OTHER): Payer: Medicare Other | Admitting: Internal Medicine

## 2012-07-16 VITALS — BP 146/90 | HR 79 | Temp 98.5°F | Resp 18 | Wt 183.0 lb

## 2012-07-16 DIAGNOSIS — B356 Tinea cruris: Secondary | ICD-10-CM

## 2012-07-16 MED ORDER — NYSTATIN-TRIAMCINOLONE 100000-0.1 UNIT/GM-% EX OINT: TOPICAL_OINTMENT | Freq: Two times a day (BID) | CUTANEOUS | Status: DC

## 2012-07-16 NOTE — Patient Instructions (Addendum)
Jock Itch Jock itch is a fungal infection of the skin in the groin area. It is sometimes called "ringworm" even though it is not caused by a worm. A fungus is a type of germ that thrives in dark, damp places.  CAUSES  This infection may spread from:  A fungus infection elsewhere on the body (such as athlete's foot).  Sharing towels or clothing. This infection is more common in:  Hot, humid climates.  People who wear tight-fitting clothing or wet bathing suits for long periods of time.  Athletes.  Overweight people.  People with diabetes. SYMPTOMS  Jock itch causes the following symptoms:  Red, pink or brown rash in the groin. Rash may spread to the thighs, anus, and buttocks.  Itching. DIAGNOSIS  Your caregiver may make the diagnosis by looking at the rash. Sometimes a skin scraping will be sent to test for fungus. Testing can be done either by looking under the microscope or by doing a culture (test to try to grow the fungus). A culture can take up to 2 weeks to come back. TREATMENT  Jock itch may be treated with:  Skin cream or ointment to kill fungus.  Medicine by mouth to kill fungus.  Skin cream or ointment to calm the itching.  Compresses or medicated powders to dry the infected skin. HOME CARE INSTRUCTIONS   Be sure to treat the rash completely. Follow your caregiver's instructions. It can take a couple of weeks to treat. If you do not treat the infection long enough, the rash can come back.  Wear loose-fitting clothing.  Men should wear cotton boxer shorts.  Women should wear cotton underwear.  Avoid hot baths.  Dry the groin area well after bathing. SEEK MEDICAL CARE IF:   Your rash is worse.  Your rash is spreading.  Your rash returns after treatment is finished.  Your rash is not gone in 4 weeks. Fungal infections are slow to respond to treatment. Some redness may remain for several weeks after the fungus is gone. SEEK IMMEDIATE MEDICAL CARE  IF:  The area becomes red, warm, tender, and swollen.  You have a fever. Document Released: 05/11/2002 Document Revised: 08/13/2011 Document Reviewed: 04/09/2008 Exit[employer] Patient Information 2013 [employer].  

## 2012-07-16 NOTE — Progress Notes (Signed)
  Subjective:    Patient ID: Isaac Phelps, male    DOB: 07-08-1930, 79 y.o.   MRN: [id number]  HPI  79 year old patient who presents with a 3-4 month history of intermittent rash in the groin and scrotal area. This has intensified over the past 4 weeks. He has tried some OTC products without much benefit.  Past Medical History  Diagnosis Date  . BENIGN PROSTATIC HYPERTROPHY 04/01/2008  . HYPERLIPIDEMIA 05/26/2007  . HYPERTENSION 02/07/2007  . TOBACCO ABUSE 04/01/2008    History   Social History  . Marital Status: Married    Spouse Name: N/A    Number of Children: N/A  . Years of Education: N/A   Occupational History  . Not on file.   Social History Main Topics  . Smoking status: Current Every Day Smoker -- 0.30 packs/day    Types: Cigarettes  . Smokeless tobacco: Never Used  . Alcohol Use: No  . Drug Use: No  . Sexually Active: Not on file   Other Topics Concern  . Not on file   Social History Narrative  . No narrative on file    Past Surgical History  Procedure Laterality Date  . Tonsillectomy    . Knee arthroscopy      Family History  Problem Relation Age of Onset  . Arthritis Neg Hx     family  . Hypertension Neg Hx     family  . Stroke Neg Hx     family , 1st degree relative    No Known Allergies  Current Outpatient Prescriptions on File Prior to Visit  Medication Sig Dispense Refill  . lisinopril-hydrochlorothiazide (PRINZIDE,ZESTORETIC) 20-25 MG per tablet Take 1 tablet by mouth daily.  90 tablet  6  . simvastatin (ZOCOR) 40 MG tablet Take 1 tablet (40 mg total) by mouth at bedtime.  90 tablet  6   No current facility-administered medications on file prior to visit.    BP 146/90  Pulse 79  Temp(Src) 98.5 F (36.9 C) (Oral)  Resp 18  Wt 183 lb (83.008 kg)  BMI 24.81 kg/m2  SpO2 98%       Review of Systems  Constitutional: Negative for fever, chills, appetite change and fatigue.  HENT: Negative for hearing loss, ear pain, congestion,  sore throat, trouble swallowing, neck stiffness, dental problem, voice change and tinnitus.   Eyes: Negative for pain, discharge and visual disturbance.  Respiratory: Negative for cough, chest tightness, wheezing and stridor.   Cardiovascular: Negative for chest pain, palpitations and leg swelling.  Gastrointestinal: Negative for nausea, vomiting, abdominal pain, diarrhea, constipation, blood in stool and abdominal distention.  Genitourinary: Positive for scrotal swelling. Negative for urgency, hematuria, flank pain, discharge, difficulty urinating and genital sores.  Musculoskeletal: Negative for myalgias, back pain, joint swelling, arthralgias and gait problem.  Skin: Positive for rash.  Neurological: Negative for dizziness, syncope, speech difficulty, weakness, numbness and headaches.  Hematological: Negative for adenopathy. Does not bruise/bleed easily.  Psychiatric/Behavioral: Negative for behavioral problems and dysphoric mood. The patient is not nervous/anxious.        Objective:   Physical Exam  Constitutional: He appears well-developed and well-nourished. No distress.  Skin:  Mild intertriginous rash in the groin region. He had more significant scrotal erythema.          Assessment & Plan:   Fungal dermatitis. Will treat with topical triamcinolone nystatin. Local wound care discussed

## 2012-09-24 ENCOUNTER — Other Ambulatory Visit: Payer: Self-pay | Admitting: Internal Medicine

## 2012-12-01 ENCOUNTER — Encounter: Payer: Self-pay | Admitting: Internal Medicine

## 2012-12-01 ENCOUNTER — Ambulatory Visit (INDEPENDENT_AMBULATORY_CARE_PROVIDER_SITE_OTHER): Payer: Medicare Other | Admitting: Internal Medicine

## 2012-12-01 VITALS — BP 140/70 | HR 72 | Temp 98.5°F | Resp 18 | Wt 181.0 lb

## 2012-12-01 DIAGNOSIS — I10 Essential (primary) hypertension: Secondary | ICD-10-CM

## 2012-12-01 DIAGNOSIS — L259 Unspecified contact dermatitis, unspecified cause: Secondary | ICD-10-CM

## 2012-12-01 DIAGNOSIS — F17200 Nicotine dependence, unspecified, uncomplicated: Secondary | ICD-10-CM

## 2012-12-01 MED ORDER — METHYLPREDNISOLONE ACETATE 80 MG/ML IJ SUSP
80.0000 mg | Freq: Once | INTRAMUSCULAR | Status: AC
  Administered 2012-12-01: 80 mg via INTRAMUSCULAR

## 2012-12-01 MED ORDER — PREDNISONE 10 MG PO TABS: 10.0000 mg | ORAL_TABLET | Freq: Two times a day (BID) | ORAL | Status: DC

## 2012-12-01 NOTE — Progress Notes (Signed)
Subjective:    Patient ID: Isaac Phelps, male    DOB: [birth year], 79 y.o.   MRN: [id number]  HPI  79 year old patient who has a history of hypertension and ongoing tobacco use. He is seen today complaining of the paretic dermatitis that is fairly generalized. This involves neck trunk extremities and the lower abdominal region. He has been spending much time outdoors with yard work.  He also has had a rash involving the axillary area  Past Medical History  Diagnosis Date  . BENIGN PROSTATIC HYPERTROPHY 04/01/2008  . HYPERLIPIDEMIA 05/26/2007  . HYPERTENSION 02/07/2007  . TOBACCO ABUSE 04/01/2008    History   Social History  . Marital Status: Married    Spouse Name: N/A    Number of Children: N/A  . Years of Education: N/A   Occupational History  . Not on file.   Social History Main Topics  . Smoking status: Current Every Day Smoker -- 0.30 packs/day    Types: Cigarettes  . Smokeless tobacco: Never Used  . Alcohol Use: No  . Drug Use: No  . Sexually Active: Not on file   Other Topics Concern  . Not on file   Social History Narrative  . No narrative on file    Past Surgical History  Procedure Laterality Date  . Tonsillectomy    . Knee arthroscopy      Family History  Problem Relation Age of Onset  . Arthritis Neg Hx     family  . Hypertension Neg Hx     family  . Stroke Neg Hx     family , 1st degree relative    No Known Allergies  Current Outpatient Prescriptions on File Prior to Visit  Medication Sig Dispense Refill  . lisinopril-hydrochlorothiazide (PRINZIDE,ZESTORETIC) 20-25 MG per tablet take 1 tablet by mouth once daily  90 tablet  3  . nystatin-triamcinolone ointment (MYCOLOG) Apply topically 2 (two) times daily.  60 g  1  . simvastatin (ZOCOR) 40 MG tablet take 1/2 tablet by mouth once daily  90 tablet  3   No current facility-administered medications on file prior to visit.    BP 140/70  Pulse 72  Temp(Src) 98.5 F (36.9 C) (Oral)  Resp 18   Wt 181 lb (82.101 kg)  BMI 24.54 kg/m2  SpO2 98%       Review of Systems  Constitutional: Negative for fever, chills, appetite change and fatigue.  HENT: Negative for hearing loss, ear pain, congestion, sore throat, trouble swallowing, neck stiffness, dental problem, voice change and tinnitus.   Eyes: Negative for pain, discharge and visual disturbance.  Respiratory: Negative for cough, chest tightness, wheezing and stridor.   Cardiovascular: Negative for chest pain, palpitations and leg swelling.  Gastrointestinal: Negative for nausea, vomiting, abdominal pain, diarrhea, constipation, blood in stool and abdominal distention.  Genitourinary: Negative for urgency, hematuria, flank pain, discharge, difficulty urinating and genital sores.  Musculoskeletal: Negative for myalgias, back pain, joint swelling, arthralgias and gait problem.  Skin: Positive for rash.  Neurological: Negative for dizziness, syncope, speech difficulty, weakness, numbness and headaches.  Hematological: Negative for adenopathy. Does not bruise/bleed easily.  Psychiatric/Behavioral: Negative for behavioral problems and dysphoric mood. The patient is not nervous/anxious.        Objective:   Physical Exam  Constitutional: He appears well-developed and well-nourished. No distress.  Blood pressure well controlled  Skin:  Erythematous raised papular dermatitis involving the right neck axillary and abdominal area as well as the extremities and lower back.  Rash is consistent with a contact dermatitis          Assessment & Plan:   Content dermatitis. Will treat with Depo-Medrol and if needed short burst of oral prednisone Hypertension stable

## 2012-12-01 NOTE — Patient Instructions (Signed)
Contact Dermatitis Contact dermatitis is a rash that happens when something touches the skin. You touched something that irritates your skin, or you have allergies to something you touched. HOME CARE   Avoid the thing that caused your rash.  Keep your rash away from hot water, soap, sunlight, chemicals, and other things that might bother it.  Do not scratch your rash.  You can take cool baths to help stop itching.  Only take medicine as told by your doctor.  Keep all doctor visits as told. GET HELP RIGHT AWAY IF:   Your rash is not better after 3 days.  Your rash gets worse.  Your rash is puffy (swollen), tender, red, sore, or warm.  You have problems with your medicine. MAKE SURE YOU:   Understand these instructions.  Will watch your condition.  Will get help right away if you are not doing well or get worse. Document Released: 03/18/2009 Document Revised: 08/13/2011 Document Reviewed: 10/24/2010 [employer] Patient Information 2014 [employer].  

## 2012-12-11 ENCOUNTER — Ambulatory Visit (INDEPENDENT_AMBULATORY_CARE_PROVIDER_SITE_OTHER): Payer: Medicare Other | Admitting: Internal Medicine

## 2012-12-11 ENCOUNTER — Encounter: Payer: Self-pay | Admitting: Internal Medicine

## 2012-12-11 VITALS — BP 140/80 | HR 75 | Temp 98.4°F | Resp 20 | Wt 181.0 lb

## 2012-12-11 DIAGNOSIS — R21 Rash and other nonspecific skin eruption: Secondary | ICD-10-CM

## 2012-12-11 DIAGNOSIS — I10 Essential (primary) hypertension: Secondary | ICD-10-CM

## 2012-12-11 DIAGNOSIS — E785 Hyperlipidemia, unspecified: Secondary | ICD-10-CM

## 2012-12-11 MED ORDER — TRIAMCINOLONE ACETONIDE 0.1 % EX CREA: TOPICAL_CREAM | Freq: Two times a day (BID) | CUTANEOUS | Status: DC

## 2012-12-11 MED ORDER — PREDNISONE 20 MG PO TABS: 20.0000 mg | ORAL_TABLET | Freq: Every day | ORAL | Status: DC

## 2012-12-11 NOTE — Patient Instructions (Addendum)
Call or return to clinic prn if these symptoms worsen or fail to improve as anticipated.

## 2012-12-11 NOTE — Progress Notes (Signed)
  Subjective:    Patient ID: Isaac Phelps, male    DOB: [birth year], 79 y.o.   MRN: [id number]  HPI  79 year old patient who is seen today for followup of a pruritic rash. He was seen recently and treated with Depo-Medrol. There is been some improvement. The rash is now only localized to the lumbar region as well as the lower abdominal area ; still remains quite pruritic  Past Medical History  Diagnosis Date  . BENIGN PROSTATIC HYPERTROPHY 04/01/2008  . HYPERLIPIDEMIA 05/26/2007  . HYPERTENSION 02/07/2007  . TOBACCO ABUSE 04/01/2008    History   Social History  . Marital Status: Married    Spouse Name: N/A    Number of Children: N/A  . Years of Education: N/A   Occupational History  . Not on file.   Social History Main Topics  . Smoking status: Current Every Day Smoker -- 0.30 packs/day    Types: Cigarettes  . Smokeless tobacco: Never Used  . Alcohol Use: No  . Drug Use: No  . Sexually Active: Not on file   Other Topics Concern  . Not on file   Social History Narrative  . No narrative on file    Past Surgical History  Procedure Laterality Date  . Tonsillectomy    . Knee arthroscopy      Family History  Problem Relation Age of Onset  . Arthritis Neg Hx     family  . Hypertension Neg Hx     family  . Stroke Neg Hx     family , 1st degree relative    No Known Allergies  Current Outpatient Prescriptions on File Prior to Visit  Medication Sig Dispense Refill  . lisinopril-hydrochlorothiazide (PRINZIDE,ZESTORETIC) 20-25 MG per tablet take 1 tablet by mouth once daily  90 tablet  3  . nystatin-triamcinolone ointment (MYCOLOG) Apply topically 2 (two) times daily.  60 g  1  . simvastatin (ZOCOR) 40 MG tablet take 1/2 tablet by mouth once daily  90 tablet  3   No current facility-administered medications on file prior to visit.    BP 140/80  Pulse 75  Temp(Src) 98.4 F (36.9 C) (Oral)  Resp 20  Wt 181 lb (82.101 kg)  BMI 24.54 kg/m2  SpO2 98%    Review  of Systems  Skin: Positive for rash.       Objective:   Physical Exam  Abdominal:  Scaly dry erythematous plaques in the lower abdominal area with some excoriations  And large area patchy dermatitis the lumbar area basically these were erythematous papular lesions with some excoriations          Assessment & Plan:   Nonspecific dermatitis-  Most c/w resolving contact derm Plan.  Retreat PO prednisone and topical therapy

## 2012-12-25 ENCOUNTER — Ambulatory Visit (INDEPENDENT_AMBULATORY_CARE_PROVIDER_SITE_OTHER): Payer: Medicare Other | Admitting: Family Medicine

## 2012-12-25 VITALS — BP 160/82 | Temp 98.5°F | Wt 182.0 lb

## 2012-12-25 DIAGNOSIS — T148XXA Other injury of unspecified body region, initial encounter: Secondary | ICD-10-CM

## 2012-12-25 MED ORDER — TRAMADOL HCL 50 MG PO TABS: 50.0000 mg | ORAL_TABLET | Freq: Three times a day (TID) | ORAL | Status: DC | PRN

## 2012-12-25 NOTE — Patient Instructions (Addendum)
-  15 minutes of heat twice daily  -gentle activity daily  -can use tylenol 500-1000mg  up to 3 times per day or ibprofen 400mg  twice per day for pain; if this does not help can use the tramadol for bad pain per instructions  -follow up with your doctor in 3-4 weeks

## 2012-12-25 NOTE — Progress Notes (Signed)
Chief Complaint  Patient presents with  . Back Pain    started on Monday     HPI:  79 yo M pt of Dr. Charm Rings here for acute visit for left low back/buttock pain: -started 5 days ago, ender area, mild to moderate, intermittent -reports has had this in the past and PCP gave him "pills" for it and told bursitis -hurts more when goes to stand up, resolved at rest/sitting -advil helped little -denies: trauma, fevers, chills, malaise, urinary symptoms, weakness, numbness, radiation  ROS: See pertinent positives and negatives per HPI.  Past Medical History  Diagnosis Date  . BENIGN PROSTATIC HYPERTROPHY 04/01/2008  . HYPERLIPIDEMIA 05/26/2007  . HYPERTENSION 02/07/2007  . TOBACCO ABUSE 04/01/2008    Family History  Problem Relation Age of Onset  . Arthritis Neg Hx     family  . Hypertension Neg Hx     family  . Stroke Neg Hx     family , 1st degree relative    History   Social History  . Marital Status: Married    Spouse Name: N/A    Number of Children: N/A  . Years of Education: N/A   Social History Main Topics  . Smoking status: Current Every Day Smoker -- 0.30 packs/day    Types: Cigarettes  . Smokeless tobacco: Never Used  . Alcohol Use: No  . Drug Use: No  . Sexually Active: Not on file   Other Topics Concern  . Not on file   Social History Narrative  . No narrative on file    Current outpatient prescriptions:lisinopril-hydrochlorothiazide (PRINZIDE,ZESTORETIC) 20-25 MG per tablet, take 1 tablet by mouth once daily, Disp: 90 tablet, Rfl: 3;  nystatin-triamcinolone ointment (MYCOLOG), Apply topically 2 (two) times daily., Disp: 60 g, Rfl: 1;  predniSONE (DELTASONE) 20 MG tablet, Take 1 tablet (20 mg total) by mouth daily., Disp: 14 tablet, Rfl: 0 simvastatin (ZOCOR) 40 MG tablet, take 1/2 tablet by mouth once daily, Disp: 90 tablet, Rfl: 3;  triamcinolone cream (KENALOG) 0.1 %, Apply topically 2 (two) times daily., Disp: 45 g, Rfl: 2;  traMADol (ULTRAM) 50 MG tablet,  Take 1 tablet (50 mg total) by mouth every 8 (eight) hours as needed for pain., Disp: 15 tablet, Rfl: 0  EXAM:  Filed Vitals:   12/25/12 0904  BP: 160/82  Temp: 98.5 F (36.9 C)    Body mass index is 24.68 kg/(m^2).  GENERAL: vitals reviewed and listed above, alert, oriented, appears well hydrated and in no acute distress  HEENT: atraumatic, conjunttiva clear, no obvious abnormalities on inspection of external nose and ears  NECK: no obvious masses on inspection  MS: moves all extremities without noticeable abnormality -gait normal, normal ROM and strength in hips -normal int/ext rot hips -no bony TTP -TTP in gluteus medius belly on L  PSYCH: pleasant and cooperative, no obvious depression or anxiety  ASSESSMENT AND PLAN:  Discussed the following assessment and plan:  Muscle strain - Plan: traMADol (ULTRAM) 50 MG tablet  -Patient advised to return or notify a doctor immediately if symptoms worsen or persist or new concerns arise.  There are no Patient Instructions on file for this visit.   Kriste Basque R.

## 2012-12-31 ENCOUNTER — Other Ambulatory Visit: Payer: Self-pay | Admitting: Internal Medicine

## 2013-01-01 NOTE — Telephone Encounter (Signed)
OK  #[phone]  RF [phone]

## 2013-01-01 NOTE — Telephone Encounter (Signed)
Pt would like refill on Tramadol please advise.

## 2013-01-02 ENCOUNTER — Other Ambulatory Visit: Payer: Self-pay | Admitting: Internal Medicine

## 2013-01-20 ENCOUNTER — Encounter: Payer: Self-pay | Admitting: Internal Medicine

## 2013-01-20 ENCOUNTER — Ambulatory Visit (INDEPENDENT_AMBULATORY_CARE_PROVIDER_SITE_OTHER): Payer: Medicare Other | Admitting: Internal Medicine

## 2013-01-20 VITALS — BP 150/80 | HR 76 | Temp 98.5°F | Resp 20 | Wt 178.0 lb

## 2013-01-20 DIAGNOSIS — M25562 Pain in left knee: Secondary | ICD-10-CM

## 2013-01-20 DIAGNOSIS — M25569 Pain in unspecified knee: Secondary | ICD-10-CM

## 2013-01-20 DIAGNOSIS — F17200 Nicotine dependence, unspecified, uncomplicated: Secondary | ICD-10-CM

## 2013-01-20 DIAGNOSIS — I10 Essential (primary) hypertension: Secondary | ICD-10-CM

## 2013-01-20 NOTE — Patient Instructions (Signed)
Call or return to clinic prn if these symptoms worsen or fail to improve as anticipated. Return in December for your annual exam

## 2013-01-20 NOTE — Progress Notes (Signed)
Subjective:    Patient ID: Isaac Phelps, male    DOB: May 03, 1931, 79 y.o.   MRN: [id number]  HPI  79 year old patient who is seen today for followup. He was seen last month complaining of left lower back and buttock discomfort that has resolved. Today he is complaining of some mild left groin discomfort with radiation to the left knee. He states symptoms are intermittent. He states today he feels quite well and is pain free. He has treated hypertension and a history of ongoing tobacco use. He is scheduled for a followup visit next month and a physical in December  Past Medical History  Diagnosis Date  . BENIGN PROSTATIC HYPERTROPHY 04/01/2008  . HYPERLIPIDEMIA 05/26/2007  . HYPERTENSION 02/07/2007  . TOBACCO ABUSE 04/01/2008    History   Social History  . Marital Status: Married    Spouse Name: N/A    Number of Children: N/A  . Years of Education: N/A   Occupational History  . Not on file.   Social History Main Topics  . Smoking status: Current Every Day Smoker -- 0.30 packs/day    Types: Cigarettes  . Smokeless tobacco: Never Used  . Alcohol Use: No  . Drug Use: No  . Sexual Activity: Not on file   Other Topics Concern  . Not on file   Social History Narrative  . No narrative on file    Past Surgical History  Procedure Laterality Date  . Tonsillectomy    . Knee arthroscopy      Family History  Problem Relation Age of Onset  . Arthritis Neg Hx     family  . Hypertension Neg Hx     family  . Stroke Neg Hx     family , 1st degree relative    No Known Allergies  Current Outpatient Prescriptions on File Prior to Visit  Medication Sig Dispense Refill  . lisinopril-hydrochlorothiazide (PRINZIDE,ZESTORETIC) 20-25 MG per tablet take 1 tablet by mouth once daily  90 tablet  3  . simvastatin (ZOCOR) 40 MG tablet take 1/2 tablet by mouth once daily  90 tablet  3  . traMADol (ULTRAM) 50 MG tablet take 1 tablet by mouth every 8 hours if needed for pain  60 tablet  3    No current facility-administered medications on file prior to visit.    BP 150/80  Pulse 76  Temp(Src) 98.5 F (36.9 C) (Oral)  Resp 20  Wt 178 lb (80.74 kg)  BMI 24.14 kg/m2  SpO2 99%       Review of Systems  Constitutional: Negative for fever, chills, appetite change and fatigue.  HENT: Negative for hearing loss, ear pain, congestion, sore throat, trouble swallowing, neck stiffness, dental problem, voice change and tinnitus.   Eyes: Negative for pain, discharge and visual disturbance.  Respiratory: Negative for cough, chest tightness, wheezing and stridor.   Cardiovascular: Negative for chest pain, palpitations and leg swelling.  Gastrointestinal: Negative for nausea, vomiting, abdominal pain, diarrhea, constipation, blood in stool and abdominal distention.  Genitourinary: Negative for urgency, hematuria, flank pain, discharge, difficulty urinating and genital sores.  Musculoskeletal: Positive for arthralgias. Negative for myalgias, back pain, joint swelling and gait problem.  Skin: Negative for rash.  Neurological: Negative for dizziness, syncope, speech difficulty, weakness, numbness and headaches.  Hematological: Negative for adenopathy. Does not bruise/bleed easily.  Psychiatric/Behavioral: Negative for behavioral problems and dysphoric mood. The patient is not nervous/anxious.        Objective:   Physical Exam  Constitutional:  He is oriented to person, place, and time. He appears well-developed.  HENT:  Head: Normocephalic.  Right Ear: External ear normal.  Left Ear: External ear normal.  Eyes: Conjunctivae and EOM are normal.  Neck: Normal range of motion.  Cardiovascular: Normal rate and normal heart sounds.   Pulmonary/Chest: Breath sounds normal.  Abdominal: Bowel sounds are normal.  Bilateral inguinal hernias right greater than left  Musculoskeletal: Normal range of motion. He exhibits no edema and no tenderness.  No signs of active inflammation left  knee. Full range of motion. Full range of motion both hips  Neurological: He is alert and oriented to person, place, and time.  Psychiatric: He has a normal mood and affect. His behavior is normal.          Assessment & Plan:   Groin and left knee pain. Symptoms seem minor and intermittent. He is pain free today. We'll continue symptomatic treatment. We'll cancel his appointment next month and patient will return in December for his annual exam. Otherwise will return as needed Hypertension well controlled

## 2013-03-03 ENCOUNTER — Ambulatory Visit: Payer: Medicare Other | Admitting: Internal Medicine

## 2013-05-21 ENCOUNTER — Encounter: Payer: Medicare Other | Admitting: Internal Medicine

## 2013-05-25 ENCOUNTER — Encounter: Payer: Self-pay | Admitting: Internal Medicine

## 2013-05-25 ENCOUNTER — Ambulatory Visit (INDEPENDENT_AMBULATORY_CARE_PROVIDER_SITE_OTHER): Payer: Medicare Other | Admitting: Internal Medicine

## 2013-05-25 VITALS — BP 168/80 | Temp 99.2°F | Ht 71.0 in | Wt 180.0 lb

## 2013-05-25 DIAGNOSIS — E785 Hyperlipidemia, unspecified: Secondary | ICD-10-CM

## 2013-05-25 DIAGNOSIS — I10 Essential (primary) hypertension: Secondary | ICD-10-CM

## 2013-05-25 DIAGNOSIS — Z0000 Encounter for general adult medical examination without abnormal findings: Secondary | ICD-10-CM

## 2013-05-25 DIAGNOSIS — F17200 Nicotine dependence, unspecified, uncomplicated: Secondary | ICD-10-CM

## 2013-05-25 DIAGNOSIS — N400 Enlarged prostate without lower urinary tract symptoms: Secondary | ICD-10-CM

## 2013-05-25 LAB — COMPREHENSIVE METABOLIC PANEL
ALT: 18 U/L (ref 0–53)
AST: 45 U/L — ABNORMAL HIGH (ref 0–37)
Alkaline Phosphatase: 72 U/L (ref 39–117)
CO2: 30 mEq/L (ref 19–32)
Creatinine, Ser: 1.8 mg/dL — ABNORMAL HIGH (ref 0.4–1.5)
GFR: 46.58 mL/min — ABNORMAL LOW (ref >60.00)
Glucose, Bld: 104 mg/dL — ABNORMAL HIGH (ref 70–99)
Sodium: 139 mEq/L (ref 135–145)
Total Bilirubin: 0.4 mg/dL (ref 0.3–1.2)
Total Protein: 8 g/dL (ref 6.0–8.3)

## 2013-05-25 LAB — LIPID PANEL
Cholesterol: 182 mg/dL (ref 0–200)
HDL: 31.2 mg/dL — ABNORMAL LOW (ref >39.00)
LDL Cholesterol: 120 mg/dL — ABNORMAL HIGH (ref 0–99)
VLDL: 30.4 mg/dL (ref 0.0–40.0)

## 2013-05-25 LAB — CBC WITH DIFFERENTIAL/PLATELET
Basophils Absolute: 0 10*3/uL (ref 0.0–0.1)
Basophils Relative: 0.9 % (ref 0.0–3.0)
Eosinophils Absolute: 0.2 10*3/uL (ref 0.0–0.7)
HCT: 39.5 % (ref 39.0–52.0)
Hemoglobin: 12.8 g/dL — ABNORMAL LOW (ref 13.0–17.0)
Lymphs Abs: 0.9 10*3/uL (ref 0.7–4.0)
MCHC: 32.3 g/dL (ref 30.0–36.0)
MCV: 82.7 fl (ref 78.0–100.0)
Monocytes Absolute: 0.5 10*3/uL (ref 0.1–1.0)
Platelets: 196 10*3/uL (ref 150.0–400.0)
RBC: 4.78 Mil/uL (ref 4.22–5.81)
RDW: 14.9 % — ABNORMAL HIGH (ref 11.5–14.6)

## 2013-05-25 MED ORDER — TRAMADOL HCL 50 MG PO TABS: ORAL_TABLET | ORAL | Status: DC

## 2013-05-25 MED ORDER — SIMVASTATIN 40 MG PO TABS: ORAL_TABLET | ORAL | Status: DC

## 2013-05-25 MED ORDER — LISINOPRIL-HYDROCHLOROTHIAZIDE 20-25 MG PO TABS: ORAL_TABLET | ORAL | Status: DC

## 2013-05-25 NOTE — Progress Notes (Signed)
Patient ID: Isaac Phelps, male   DOB: Feb 27, 1931, 79 y.o.   MRN: [id number]  Subjective:    Patient ID: Isaac Phelps, male    DOB: 26-Dec-1930, 79 y.o.   MRN: [id number]  Hypertension Pertinent negatives include no chest pain, headaches, neck pain, palpitations or shortness of breath.  Hyperlipidemia Pertinent negatives include no chest pain, myalgias or shortness of breath.  Pre-visit discussion using our clinic review tool. No additional management support is needed unless otherwise documented below in the visit note.  History of Present Illness:   79 year-old patient who is seen today for a comprehensive evaluation. He has a history of treated hypertension and dyslipidemia, and does remarkably well  He was evaluated earlier last  year for a right-sided chest wall pain. This included a chest CT that revealed a nondistinct  lesion involving the superior aspect of the left lower lobe. A followup CT scan was recommended. This again was discussed with the patient who now is symptom-free he does not wish to proceed with a followup CT scan at this time;  he is aware of the possibility of early lung cancer  Here for Medicare AWV:   1. Risk factors based on Past M, S, F history: cardiovascular risk factors include hypertension, and dyslipidemia. As well as tobacco use 2. 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 Also walks daily  3. Depression/mood: no history of depression or mood disorder  4. Hearing: no deficits  5. ADL's: independent in all aspects of daily living  6. Fall Risk: low  7. Home Safety: no problems identified  8. Height, weight, &visual acuity:height and weight stable has had recent cataract extraction surgery and lens implantation. Very pleased with the outcome  9. Counseling: heart healthy, salt restricted diet. Encouraged  10. Labs ordered based on risk factors: laboratory profile, including lipid panel will be reviewed  11. Referral  Coordination- not appropriate at this time ;has declined screening colonoscopies  12. Care Plan- continue, heart healthy diet, exercise regimen  13. Cognitive Assessment- alert and oriented, with normal affect. No history of memory dysfunction. Handles all executive functioning without difficulty   Preventive Screening-Counseling & Management  Alcohol-Tobacco  Smoking Status: current  Smoking Cessation Counseling: yes   Current Problems (verified):   1) Preventive Health Care (ICD-V70.0)  2) Tobacco Abuse (ICD-305.1)  3) Benign Prostatic Hypertrophy (ICD-600.00)  4) Benign Prostatic Hypertrophy, Hx of (ICD-V13.8)  5) Hyperlipidemia (ICD-272.4)  6) Hypertension (ICD-401.9)   Current Medications (verified):  1) Simvastatin 40 Mg Tabs (Simvastatin) .... 1/2 Once Daily  2) Lisinopril-Hydrochlorothiazide 20-25 Mg Tabs (Lisinopril-Hydrochlorothiazide) .... One Daily   Allergies (verified):  No Known Drug Allergies   Contraindications/Deferment of Procedures/Staging:  Test/Procedure: Pneumovax vaccine  Reason for deferment: patient declined   Past History:  Past Medical History:   Hypertension  Hyperlipidemia  ongoing tobacco use  Benign prostatic hypertrophy   Past Surgical History:   Tonsillectomy  Arthroscopic Knee surgery  no prior colonoscopy-declines   Family History:   Family History of Arthritis  Family History Hypertension  Family History of Stroke M 1st degree relative <50  father died age 32 of probable CVA  mother died at age 24  One brother one sister in good health. No family history cancer   Social History:    Retired works part-time as a Electrical engineer  Married  Current Smoker  one child    Review of Systems  Constitutional: Negative for fever, chills, activity  change, appetite change and fatigue.  HENT: Negative for congestion, dental problem, ear pain, hearing loss, mouth sores, rhinorrhea, sinus pressure, sneezing, tinnitus, trouble  swallowing and voice change.   Eyes: Negative for photophobia, pain, redness and visual disturbance.  Respiratory: Negative for apnea, cough, choking, chest tightness, shortness of breath and wheezing.   Cardiovascular: Negative for chest pain, palpitations and leg swelling.  Gastrointestinal: Negative for nausea, vomiting, abdominal pain, diarrhea, constipation, blood in stool, abdominal distention, anal bleeding and rectal pain.  Genitourinary: Negative for dysuria, urgency, frequency, hematuria, flank pain, decreased urine volume, discharge, penile swelling, scrotal swelling, difficulty urinating, genital sores and testicular pain.  Musculoskeletal: Negative for arthralgias, back pain, gait problem, joint swelling, myalgias, neck pain and neck stiffness.       Left shoulder pain of 7 months duration  Skin: Negative for color change, rash and wound.  Neurological: Negative for dizziness, tremors, seizures, syncope, facial asymmetry, speech difficulty, weakness, light-headedness, numbness and headaches.  Hematological: Negative for adenopathy. Does not bruise/bleed easily.  Psychiatric/Behavioral: Negative for suicidal ideas, hallucinations, behavioral problems, confusion, sleep disturbance, self-injury, dysphoric mood, decreased concentration and agitation. The patient is not nervous/anxious.        Objective:   Physical Exam  Constitutional: He appears well-developed and well-nourished.  Repeat blood pressure 140/80  HENT:  Head: Normocephalic and atraumatic.  Right Ear: External ear normal.  Left Ear: External ear normal.  Nose: Nose normal.  Mouth/Throat: Oropharynx is clear and moist.  Eyes: Conjunctivae and EOM are normal. Pupils are equal, round, and reactive to light. No scleral icterus.  Neck: Normal range of motion. Neck supple. No JVD present. No thyromegaly present.  Cardiovascular: Regular rhythm, normal heart sounds and intact distal pulses.  Exam reveals no gallop and no  friction rub.   No murmur heard. Dorsalis pedis pulses not easily palpable. Posterior tibial pulses full  Pulmonary/Chest: Effort normal and breath sounds normal. He exhibits no tenderness.  Abdominal: Soft. Bowel sounds are normal. He exhibits no distension and no mass. There is no tenderness.  Bilateral femoral hernias  Genitourinary: Prostate normal. Guaiac negative stool.  Prostate +3  Musculoskeletal: Normal range of motion. He exhibits no edema and no tenderness.  Flat feet  Lymphadenopathy:    He has no cervical adenopathy.  Neurological: He is alert. He has normal reflexes. No cranial nerve deficit. Coordination normal.  Skin: Skin is warm and dry. No rash noted.  Pigmented rash in the lower abdominal region  Psychiatric: He has a normal mood and affect. His behavior is normal.          Assessment & Plan:   Preventive health Hypertension stable BPH Dyslipidemia Ongoing tobacco use  Laboratory update will be obtained Medications refilled Smoking cessation encouraged Recheck one year

## 2013-05-25 NOTE — Progress Notes (Signed)
Pre visit review using our clinic review tool, if applicable. No additional management support is needed unless otherwise documented below in the visit note. 

## 2013-05-25 NOTE — Patient Instructions (Addendum)
Limit your sodium (Salt) intake  Smoking tobacco is very bad for your health. You should stop smoking immediately.  Please check your blood pressure on a regular basis.  If it is consistently greater than 150/90, please make an office appointment.  Return in one year for follow-up  Impingement Syndrome, Rotator Cuff, Bursitis with Rehab Impingement syndrome is a condition that involves inflammation of the tendons of the rotator cuff and the subacromial bursa, that causes pain in the shoulder. The rotator cuff consists of four tendons and muscles that control much of the shoulder and upper arm function. The subacromial bursa is a fluid filled sac that helps reduce friction between the rotator cuff and one of the bones of the shoulder (acromion). Impingement syndrome is usually an overuse injury that causes swelling of the bursa (bursitis), swelling of the tendon (tendonitis), and/or a tear of the tendon (strain). Strains are classified into three categories. Grade 1 strains cause pain, but the tendon is not lengthened. Grade 2 strains include a lengthened ligament, due to the ligament being stretched or partially ruptured. With grade 2 strains there is still function, although the function may be decreased. Grade 3 strains include a complete tear of the tendon or muscle, and function is usually impaired. SYMPTOMS   Pain around the shoulder, often at the outer portion of the upper arm.  Pain that gets worse with shoulder function, especially when reaching overhead or lifting.  Sometimes, aching when not using the arm.  Pain that wakes you up at night.  Sometimes, tenderness, swelling, warmth, or redness over the affected area.  Loss of strength.  Limited motion of the shoulder, especially reaching behind the back (to the back pocket or to unhook bra) or across your body.  Crackling sound (crepitation) when moving the arm.  Biceps tendon pain and inflammation (in the front of the shoulder).  Worse when bending the elbow or lifting. CAUSES  Impingement syndrome is often an overuse injury, in which chronic (repetitive) motions cause the tendons or bursa to become inflamed. A strain occurs when a force is paced on the tendon or muscle that is greater than it can withstand. Common mechanisms of injury include: Stress from sudden increase in duration, frequency, or intensity of training.  Direct hit (trauma) to the shoulder.  Aging, erosion of the tendon with normal use.  Bony bump on shoulder (acromial spur). RISK INCREASES WITH:  Contact sports (football, wrestling, boxing).  Throwing sports (baseball, tennis, volleyball).  Weightlifting and bodybuilding.  Heavy labor.  Previous injury to the rotator cuff, including impingement.  Poor shoulder strength and flexibility.  Failure to warm up properly before activity.  Inadequate protective equipment.  Old age.  Bony bump on shoulder (acromial spur). PREVENTION   Warm up and stretch properly before activity.  Allow for adequate recovery between workouts.  Maintain physical fitness:  Strength, flexibility, and endurance.  Cardiovascular fitness.  Learn and use proper exercise technique. PROGNOSIS  If treated properly, impingement syndrome usually goes away within 6 weeks. Sometimes surgery is required.  RELATED COMPLICATIONS   Longer healing time if not properly treated, or if not given enough time to heal.  Recurring symptoms, that result in a chronic condition.  Shoulder stiffness, frozen shoulder, or loss of motion.  Rotator cuff tendon tear.  Recurring symptoms, especially if activity is resumed too soon, with overuse, with a direct blow, or when using poor technique. TREATMENT  Treatment first involves the use of ice and medicine, to reduce pain and  inflammation. The use of strengthening and stretching exercises may help reduce pain with activity. These exercises may be performed at home or with a  therapist. If non-surgical treatment is unsuccessful after more than 6 months, surgery may be advised. After surgery and rehabilitation, activity is usually possible in 3 months.  MEDICATION  If pain medicine is needed, nonsteroidal anti-inflammatory medicines (aspirin and ibuprofen), or other minor pain relievers (acetaminophen), are often advised.  Do not take pain medicine for 7 days before surgery.  Prescription pain relievers may be given, if your caregiver thinks they are needed. Use only as directed and only as much as you need.  Corticosteroid injections may be given by your caregiver. These injections should be reserved for the most serious cases, because they may only be given a certain number of times. HEAT AND COLD  Cold treatment (icing) should be applied for 10 to 15 minutes every 2 to 3 hours for inflammation and pain, and immediately after activity that aggravates your symptoms. Use ice packs or an ice massage.  Heat treatment may be used before performing stretching and strengthening activities prescribed by your caregiver, physical therapist, or athletic trainer. Use a heat pack or a warm water soak. SEEK MEDICAL CARE IF:   Symptoms get worse or do not improve in 4 to 6 weeks, despite treatment.  New, unexplained symptoms develop. (Drugs used in treatment may produce side effects.) EXERCISES  RANGE OF MOTION (ROM) AND STRETCHING EXERCISES - Impingement Syndrome (Rotator Cuff  Tendinitis, Bursitis) These exercises may help you when beginning to rehabilitate your injury. Your symptoms may go away with or without further involvement from your physician, physical therapist or athletic trainer. While completing these exercises, remember:   Restoring tissue flexibility helps normal motion to return to the joints. This allows healthier, less painful movement and activity.  An effective stretch should be held for at least 30 seconds.  A stretch should never be painful. You  should only feel a gentle lengthening or release in the stretched tissue. STRETCH  Flexion, Standing  Stand with good posture. With an underhand grip on your right / left hand, and an overhand grip on the opposite hand, grasp a broomstick or cane so that your hands are a little more than shoulder width apart.  Keeping your right / left elbow straight and shoulder muscles relaxed, push the stick with your opposite hand, to raise your right / left arm in front of your body and then overhead. Raise your arm until you feel a stretch in your right / left shoulder, but before you have increased shoulder pain.  Try to avoid shrugging your right / left shoulder as your arm rises, by keeping your shoulder blade tucked down and toward your mid-back spine. Hold for __________ seconds.  Slowly return to the starting position. Repeat __________ times. Complete this exercise __________ times per day. STRETCH  Abduction, Supine  Lie on your back. With an underhand grip on your right / left hand and an overhand grip on the opposite hand, grasp a broomstick or cane so that your hands are a little more than shoulder width apart.  Keeping your right / left elbow straight and your shoulder muscles relaxed, push the stick with your opposite hand, to raise your right / left arm out to the side of your body and then overhead. Raise your arm until you feel a stretch in your right / left shoulder, but before you have increased shoulder pain.  Try to avoid shrugging  your right / left shoulder as your arm rises, by keeping your shoulder blade tucked down and toward your mid-back spine. Hold for __________ seconds.  Slowly return to the starting position. Repeat __________ times. Complete this exercise __________ times per day. ROM  Flexion, Active-Assisted  Lie on your back. You may bend your knees for comfort.  Grasp a broomstick or cane so your hands are about shoulder width apart. Your right / left hand should grip  the end of the stick, so that your hand is positioned "thumbs-up," as if you were about to shake hands.  Using your healthy arm to lead, raise your right / left arm overhead, until you feel a gentle stretch in your shoulder. Hold for __________ seconds.  Use the stick to assist in returning your right / left arm to its starting position. Repeat __________ times. Complete this exercise __________ times per day.  ROM - Internal Rotation, Supine   Lie on your back on a firm surface. Place your right / left elbow about 60 degrees away from your side. Elevate your elbow with a folded towel, so that the elbow and shoulder are the same height.  Using a broomstick or cane and your strong arm, pull your right / left hand toward your body until you feel a gentle stretch, but no increase in your shoulder pain. Keep your shoulder and elbow in place throughout the exercise.  Hold for __________ seconds. Slowly return to the starting position. Repeat __________ times. Complete this exercise __________ times per day. STRETCH - Internal Rotation  Place your right / left hand behind your back, palm up.  Throw a towel or belt over your opposite shoulder. Grasp the towel with your right / left hand.  While keeping an upright posture, gently pull up on the towel, until you feel a stretch in the front of your right / left shoulder.  Avoid shrugging your right / left shoulder as your arm rises, by keeping your shoulder blade tucked down and toward your mid-back spine.  Hold for __________ seconds. Release the stretch, by lowering your healthy hand. Repeat __________ times. Complete this exercise __________ times per day. ROM - Internal Rotation   Using an underhand grip, grasp a stick behind your back with both hands.  While standing upright with good posture, slide the stick up your back until you feel a mild stretch in the front of your shoulder.  Hold for __________ seconds. Slowly return to your  starting position. Repeat __________ times. Complete this exercise __________ times per day.  STRETCH  Posterior Shoulder Capsule   Stand or sit with good posture. Grasp your right / left elbow and draw it across your chest, keeping it at the same height as your shoulder.  Pull your elbow, so your upper arm comes in closer to your chest. Pull until you feel a gentle stretch in the back of your shoulder.  Hold for __________ seconds. Repeat __________ times. Complete this exercise __________ times per day. STRENGTHENING EXERCISES - Impingement Syndrome (Rotator Cuff Tendinitis, Bursitis) These exercises may help you when beginning to rehabilitate your injury. They may resolve your symptoms with or without further involvement from your physician, physical therapist or athletic trainer. While completing these exercises, remember:  Muscles can gain both the endurance and the strength needed for everyday activities through controlled exercises.  Complete these exercises as instructed by your physician, physical therapist or athletic trainer. Increase the resistance and repetitions only as guided.  You may experience  muscle soreness or fatigue, but the pain or discomfort you are trying to eliminate should never worsen during these exercises. If this pain does get worse, stop and make sure you are following the directions exactly. If the pain is still present after adjustments, discontinue the exercise until you can discuss the trouble with your clinician.  During your recovery, avoid activity or exercises which involve actions that place your injured hand or elbow above your head or behind your back or head. These positions stress the tissues which you are trying to heal. STRENGTH - Scapular Depression and Adduction   With good posture, sit on a firm chair. Support your arms in front of you, with pillows, arm rests, or on a table top. Have your elbows in line with the sides of your body.  Gently  draw your shoulder blades down and toward your mid-back spine. Gradually increase the tension, without tensing the muscles along the top of your shoulders and the back of your neck.  Hold for __________ seconds. Slowly release the tension and relax your muscles completely before starting the next repetition.  After you have practiced this exercise, remove the arm support and complete the exercise in standing as well as sitting position. Repeat __________ times. Complete this exercise __________ times per day.  STRENGTH - Shoulder Abductors, Isometric  With good posture, stand or sit about 4-6 inches from a wall, with your right / left side facing the wall.  Bend your right / left elbow. Gently press your right / left elbow into the wall. Increase the pressure gradually, until you are pressing as hard as you can, without shrugging your shoulder or increasing any shoulder discomfort.  Hold for __________ seconds.  Release the tension slowly. Relax your shoulder muscles completely before you begin the next repetition. Repeat __________ times. Complete this exercise __________ times per day.  STRENGTH - External Rotators, Isometric  Keep your right / left elbow at your side and bend it 90 degrees.  Step into a door frame so that the outside of your right / left wrist can press against the door frame without your upper arm leaving your side.  Gently press your right / left wrist into the door frame, as if you were trying to swing the back of your hand away from your stomach. Gradually increase the tension, until you are pressing as hard as you can, without shrugging your shoulder or increasing any shoulder discomfort.  Hold for __________ seconds.  Release the tension slowly. Relax your shoulder muscles completely before you begin the next repetition. Repeat __________ times. Complete this exercise __________ times per day.  STRENGTH - Supraspinatus   Stand or sit with good posture. Grasp a  __________ weight, or an exercise band or tubing, so that your hand is "thumbs-up," like you are shaking hands.  Slowly lift your right / left arm in a "V" away from your thigh, diagonally into the space between your side and straight ahead. Lift your hand to shoulder height or as far as you can, without increasing any shoulder pain. At first, many people do not lift their hands above shoulder height.  Avoid shrugging your right / left shoulder as your arm rises, by keeping your shoulder blade tucked down and toward your mid-back spine.  Hold for __________ seconds. Control the descent of your hand, as you slowly return to your starting position. Repeat __________ times. Complete this exercise __________ times per day.  STRENGTH - External Rotators  Secure a rubber  exercise band or tubing to a fixed object (table, pole) so that it is at the same height as your right / left elbow when you are standing or sitting on a firm surface.  Stand or sit so that the secured exercise band is at your uninjured side.  Bend your right / left elbow 90 degrees. Place a folded towel or small pillow under your right / left arm, so that your elbow is a few inches away from your side.  Keeping the tension on the exercise band, pull it away from your body, as if pivoting on your elbow. Be sure to keep your body steady, so that the movement is coming only from your rotating shoulder.  Hold for __________ seconds. Release the tension in a controlled manner, as you return to the starting position. Repeat __________ times. Complete this exercise __________ times per day.  STRENGTH - Internal Rotators   Secure a rubber exercise band or tubing to a fixed object (table, pole) so that it is at the same height as your right / left elbow when you are standing or sitting on a firm surface.  Stand or sit so that the secured exercise band is at your right / left side.  Bend your elbow 90 degrees. Place a folded towel or  small pillow under your right / left arm so that your elbow is a few inches away from your side.  Keeping the tension on the exercise band, pull it across your body, toward your stomach. Be sure to keep your body steady, so that the movement is coming only from your rotating shoulder.  Hold for __________ seconds. Release the tension in a controlled manner, as you return to the starting position. Repeat __________ times. Complete this exercise __________ times per day.  STRENGTH - Scapular Protractors, Standing   Stand arms length away from a wall. Place your hands on the wall, keeping your elbows straight.  Begin by dropping your shoulder blades down and toward your mid-back spine.  To strengthen your protractors, keep your shoulder blades down, but slide them forward on your rib cage. It will feel as if you are lifting the back of your rib cage away from the wall. This is a subtle motion and can be challenging to complete. Ask your caregiver for further instruction, if you are not sure you are doing the exercise correctly.  Hold for __________ seconds. Slowly return to the starting position, resting the muscles completely before starting the next repetition. Repeat __________ times. Complete this exercise __________ times per day. STRENGTH - Scapular Protractors, Supine  Lie on your back on a firm surface. Extend your right / left arm straight into the air while holding a __________ weight in your hand.  Keeping your head and back in place, lift your shoulder off the floor.  Hold for __________ seconds. Slowly return to the starting position, and allow your muscles to relax completely before starting the next repetition. Repeat __________ times. Complete this exercise __________ times per day. STRENGTH - Scapular Protractors, Quadruped  Get onto your hands and knees, with your shoulders directly over your hands (or as close as you can be, comfortably).  Keeping your elbows locked, lift  the back of your rib cage up into your shoulder blades, so your mid-back rounds out. Keep your neck muscles relaxed.  Hold this position for __________ seconds. Slowly return to the starting position and allow your muscles to relax completely before starting the next repetition. Repeat __________ times.  Complete this exercise __________ times per day.  STRENGTH - Scapular Retractors  Secure a rubber exercise band or tubing to a fixed object (table, pole), so that it is at the height of your shoulders when you are either standing, or sitting on a firm armless chair.  With a palm down grip, grasp an end of the band in each hand. Straighten your elbows and lift your hands straight in front of you, at shoulder height. Step back, away from the secured end of the band, until it becomes tense.  Squeezing your shoulder blades together, draw your elbows back toward your sides, as you bend them. Keep your upper arms lifted away from your body throughout the exercise.  Hold for __________ seconds. Slowly ease the tension on the band, as you reverse the directions and return to the starting position. Repeat __________ times. Complete this exercise __________ times per day. STRENGTH - Shoulder Extensors   Secure a rubber exercise band or tubing to a fixed object (table, pole) so that it is at the height of your shoulders when you are either standing, or sitting on a firm armless chair.  With a thumbs-up grip, grasp an end of the band in each hand. Straighten your elbows and lift your hands straight in front of you, at shoulder height. Step back, away from the secured end of the band, until it becomes tense.  Squeezing your shoulder blades together, pull your hands down to the sides of your thighs. Do not allow your hands to go behind you.  Hold for __________ seconds. Slowly ease the tension on the band, as you reverse the directions and return to the starting position. Repeat __________ times. Complete  this exercise __________ times per day.  STRENGTH - Scapular Retractors and External Rotators   Secure a rubber exercise band or tubing to a fixed object (table, pole) so that it is at the height as your shoulders, when you are either standing, or sitting on a firm armless chair.  With a palm down grip, grasp an end of the band in each hand. Bend your elbows 90 degrees and lift your elbows to shoulder height, at your sides. Step back, away from the secured end of the band, until it becomes tense.  Squeezing your shoulder blades together, rotate your shoulders so that your upper arms and elbows remain stationary, but your fists travel upward to head height.  Hold for __________ seconds. Slowly ease the tension on the band, as you reverse the directions and return to the starting position. Repeat __________ times. Complete this exercise __________ times per day.  STRENGTH - Scapular Retractors and External Rotators, Rowing   Secure a rubber exercise band or tubing to a fixed object (table, pole) so that it is at the height of your shoulders, when you are either standing, or sitting on a firm armless chair.  With a palm down grip, grasp an end of the band in each hand. Straighten your elbows and lift your hands straight in front of you, at shoulder height. Step back, away from the secured end of the band, until it becomes tense.  Step 1: Squeeze your shoulder blades together. Bending your elbows, draw your hands to your chest, as if you are rowing a boat. At the end of this motion, your hands and elbow should be at shoulder height and your elbows should be out to your sides.  Step 2: Rotate your shoulders, to raise your hands above your head. Your forearms should be  vertical and your upper arms should be horizontal.  Hold for __________ seconds. Slowly ease the tension on the band, as you reverse the directions and return to the starting position. Repeat __________ times. Complete this exercise  __________ times per day.  STRENGTH  Scapular Depressors  Find a sturdy chair without wheels, such as a dining room chair.  Keeping your feet on the floor, and your hands on the chair arms, lift your bottom up from the seat, and lock your elbows.  Keeping your elbows straight, allow gravity to pull your body weight down. Your shoulders will rise toward your ears.  Raise your body against gravity by drawing your shoulder blades down your back, shortening the distance between your shoulders and ears. Although your feet should always maintain contact with the floor, your feet should progressively support less body weight, as you get stronger.  Hold for __________ seconds. In a controlled and slow manner, lower your body weight to begin the next repetition. Repeat __________ times. Complete this exercise __________ times per day.  Document Released: 05/21/2005 Document Revised: 08/13/2011 Document Reviewed: 09/02/2008 [facility] Patient Information 2014 [place], [place].

## 2013-12-06 ENCOUNTER — Emergency Department (HOSPITAL_COMMUNITY): Payer: Medicare Other

## 2013-12-06 ENCOUNTER — Encounter (HOSPITAL_COMMUNITY): Payer: Self-pay | Admitting: Emergency Medicine

## 2013-12-06 ENCOUNTER — Emergency Department (HOSPITAL_COMMUNITY)
Admission: EM | Admit: 2013-12-06 | Discharge: 2013-12-06 | Disposition: A | Discharge status: Home / Self Care | Payer: Medicare Other | Attending: Emergency Medicine | Admitting: Emergency Medicine

## 2013-12-06 DIAGNOSIS — Z862 Personal history of diseases of the blood and blood-forming organs and certain disorders involving the immune mechanism: Secondary | ICD-10-CM | POA: Insufficient documentation

## 2013-12-06 DIAGNOSIS — Z79899 Other long term (current) drug therapy: Secondary | ICD-10-CM | POA: Insufficient documentation

## 2013-12-06 DIAGNOSIS — Z8639 Personal history of other endocrine, nutritional and metabolic disease: Secondary | ICD-10-CM | POA: Insufficient documentation

## 2013-12-06 DIAGNOSIS — F17200 Nicotine dependence, unspecified, uncomplicated: Secondary | ICD-10-CM | POA: Insufficient documentation

## 2013-12-06 DIAGNOSIS — I10 Essential (primary) hypertension: Secondary | ICD-10-CM | POA: Insufficient documentation

## 2013-12-06 DIAGNOSIS — K4090 Unilateral inguinal hernia, without obstruction or gangrene, not specified as recurrent: Secondary | ICD-10-CM | POA: Insufficient documentation

## 2013-12-06 DIAGNOSIS — K5900 Constipation, unspecified: Secondary | ICD-10-CM | POA: Insufficient documentation

## 2013-12-06 DIAGNOSIS — R339 Retention of urine, unspecified: Secondary | ICD-10-CM | POA: Insufficient documentation

## 2013-12-06 DIAGNOSIS — Z96659 Presence of unspecified artificial knee joint: Secondary | ICD-10-CM | POA: Insufficient documentation

## 2013-12-06 DIAGNOSIS — Z87448 Personal history of other diseases of urinary system: Secondary | ICD-10-CM | POA: Insufficient documentation

## 2013-12-06 DIAGNOSIS — K5904 Chronic idiopathic constipation: Secondary | ICD-10-CM

## 2013-12-06 LAB — BASIC METABOLIC PANEL
ANION GAP: 16 — AB (ref 5–15)
BUN: 35 mg/dL — ABNORMAL HIGH (ref 6–23)
CHLORIDE: 101 meq/L (ref 96–112)
CO2: 22 meq/L (ref 19–32)
Calcium: 9.3 mg/dL (ref 8.4–10.5)
Creatinine, Ser: 1.7 mg/dL — ABNORMAL HIGH (ref 0.50–1.35)
GFR calc Af Amer: 42 mL/min — ABNORMAL LOW (ref >90)
GFR calc non Af Amer: 36 mL/min — ABNORMAL LOW (ref >90)
Glucose, Bld: 122 mg/dL — ABNORMAL HIGH (ref 70–99)
POTASSIUM: 4 meq/L (ref 3.7–5.3)
SODIUM: 139 meq/L (ref 137–147)

## 2013-12-06 LAB — CBC WITH DIFFERENTIAL/PLATELET
BASOS ABS: 0 10*3/uL (ref 0.0–0.1)
Basophils Relative: 0 % (ref 0–1)
Eosinophils Absolute: 0.2 10*3/uL (ref 0.0–0.7)
Eosinophils Relative: 2 % (ref 0–5)
HEMATOCRIT: 37.3 % — AB (ref 39.0–52.0)
Hemoglobin: 12.5 g/dL — ABNORMAL LOW (ref 13.0–17.0)
LYMPHS PCT: 11 % — AB (ref 12–46)
Lymphs Abs: 1.1 10*3/uL (ref 0.7–4.0)
MCH: 27.2 pg (ref 26.0–34.0)
MCHC: 33.5 g/dL (ref 30.0–36.0)
MCV: 81.3 fL (ref 78.0–100.0)
MONO ABS: 0.6 10*3/uL (ref 0.1–1.0)
Monocytes Relative: 6 % (ref 3–12)
Neutro Abs: 8.6 10*3/uL — ABNORMAL HIGH (ref 1.7–7.7)
Neutrophils Relative %: 81 % — ABNORMAL HIGH (ref 43–77)
PLATELETS: 216 10*3/uL (ref 150–400)
RBC: 4.59 MIL/uL (ref 4.22–5.81)
RDW: 15 % (ref 11.5–15.5)
WBC: 10.6 10*3/uL — ABNORMAL HIGH (ref 4.0–10.5)

## 2013-12-06 LAB — URINALYSIS, ROUTINE W REFLEX MICROSCOPIC
Bilirubin Urine: NEGATIVE
Glucose, UA: NEGATIVE mg/dL
Ketones, ur: NEGATIVE mg/dL
LEUKOCYTES UA: NEGATIVE
NITRITE: NEGATIVE
Protein, ur: NEGATIVE mg/dL
SPECIFIC GRAVITY, URINE: 1.02 (ref 1.005–1.030)
UROBILINOGEN UA: 0.2 mg/dL (ref 0.0–1.0)
pH: 5 (ref 5.0–8.0)

## 2013-12-06 LAB — URINE MICROSCOPIC-ADD ON

## 2013-12-06 IMAGING — CR DG ABDOMEN ACUTE W/ 1V CHEST
4 series · 4 of 4 positions shown · non-contrast
Comparison: Prior CT from [DATE]

CLINICAL DATA: abd pain and distention

EXAM:
ACUTE ABDOMEN SERIES (ABDOMEN 2 VIEW & CHEST 1 VIEW)

[x chest ap]
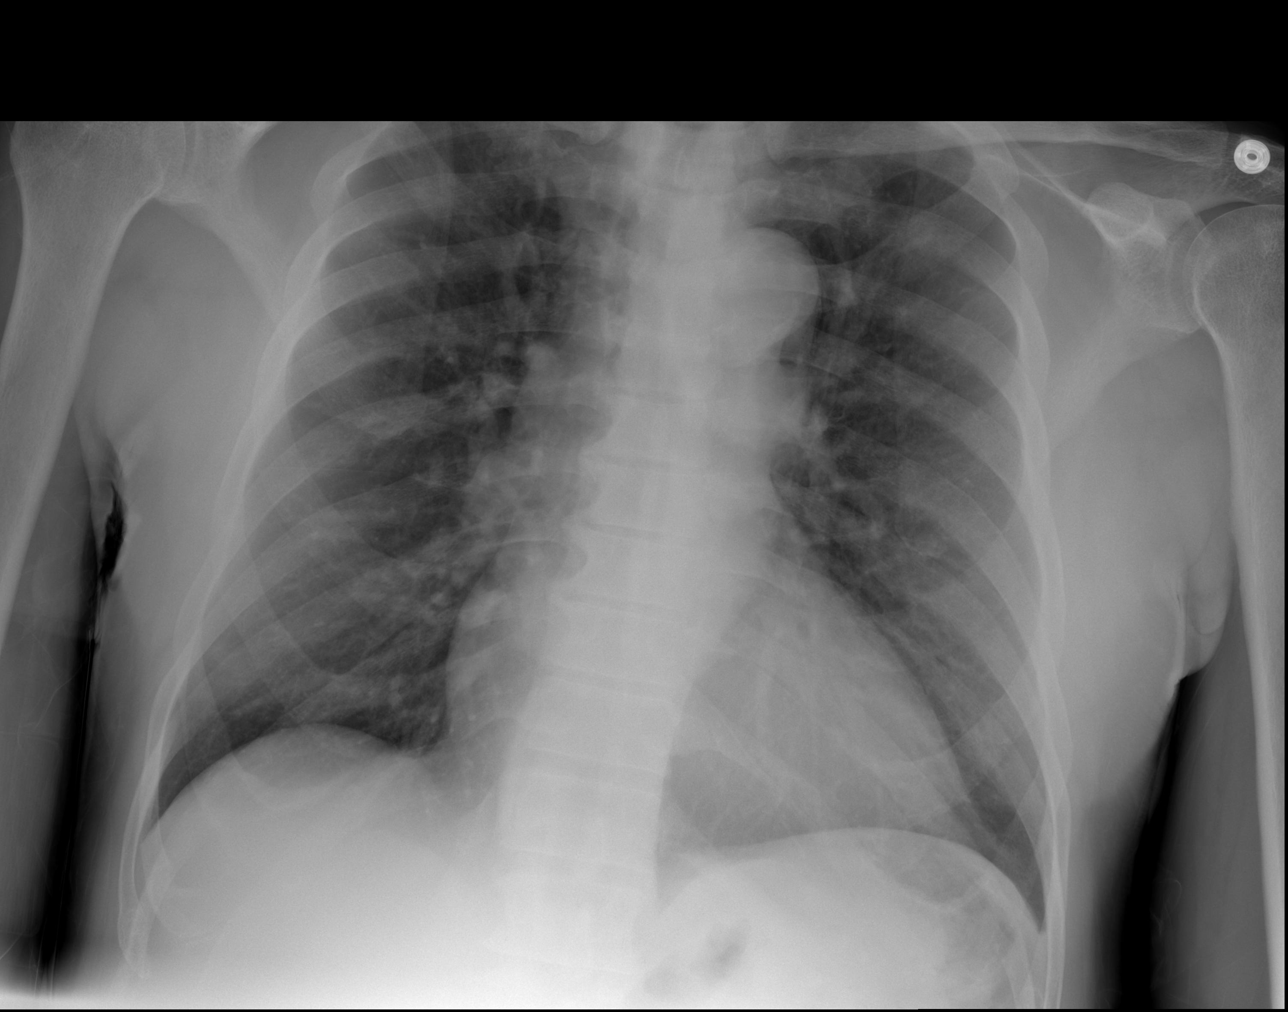

[x abdomen erect]
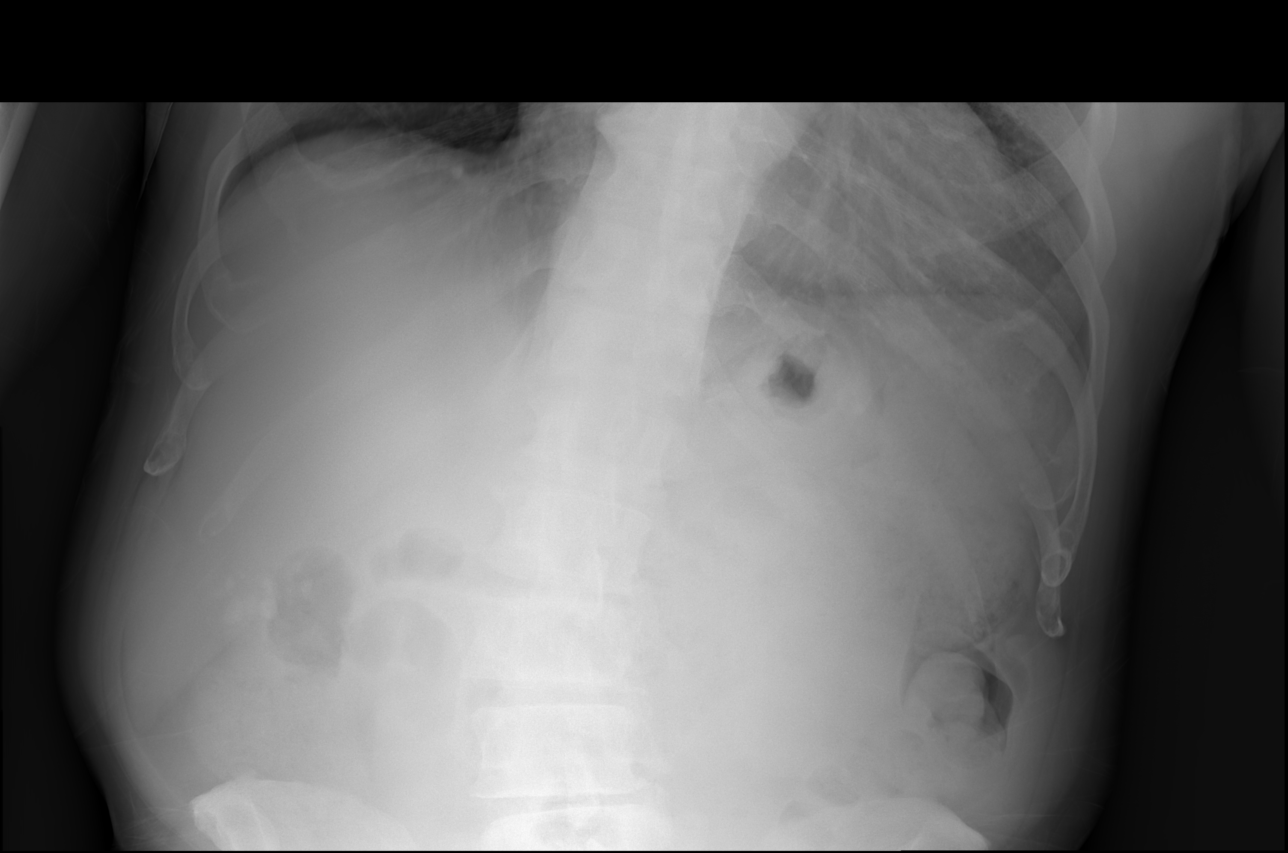

[x abdomen supine (1 of 2)]
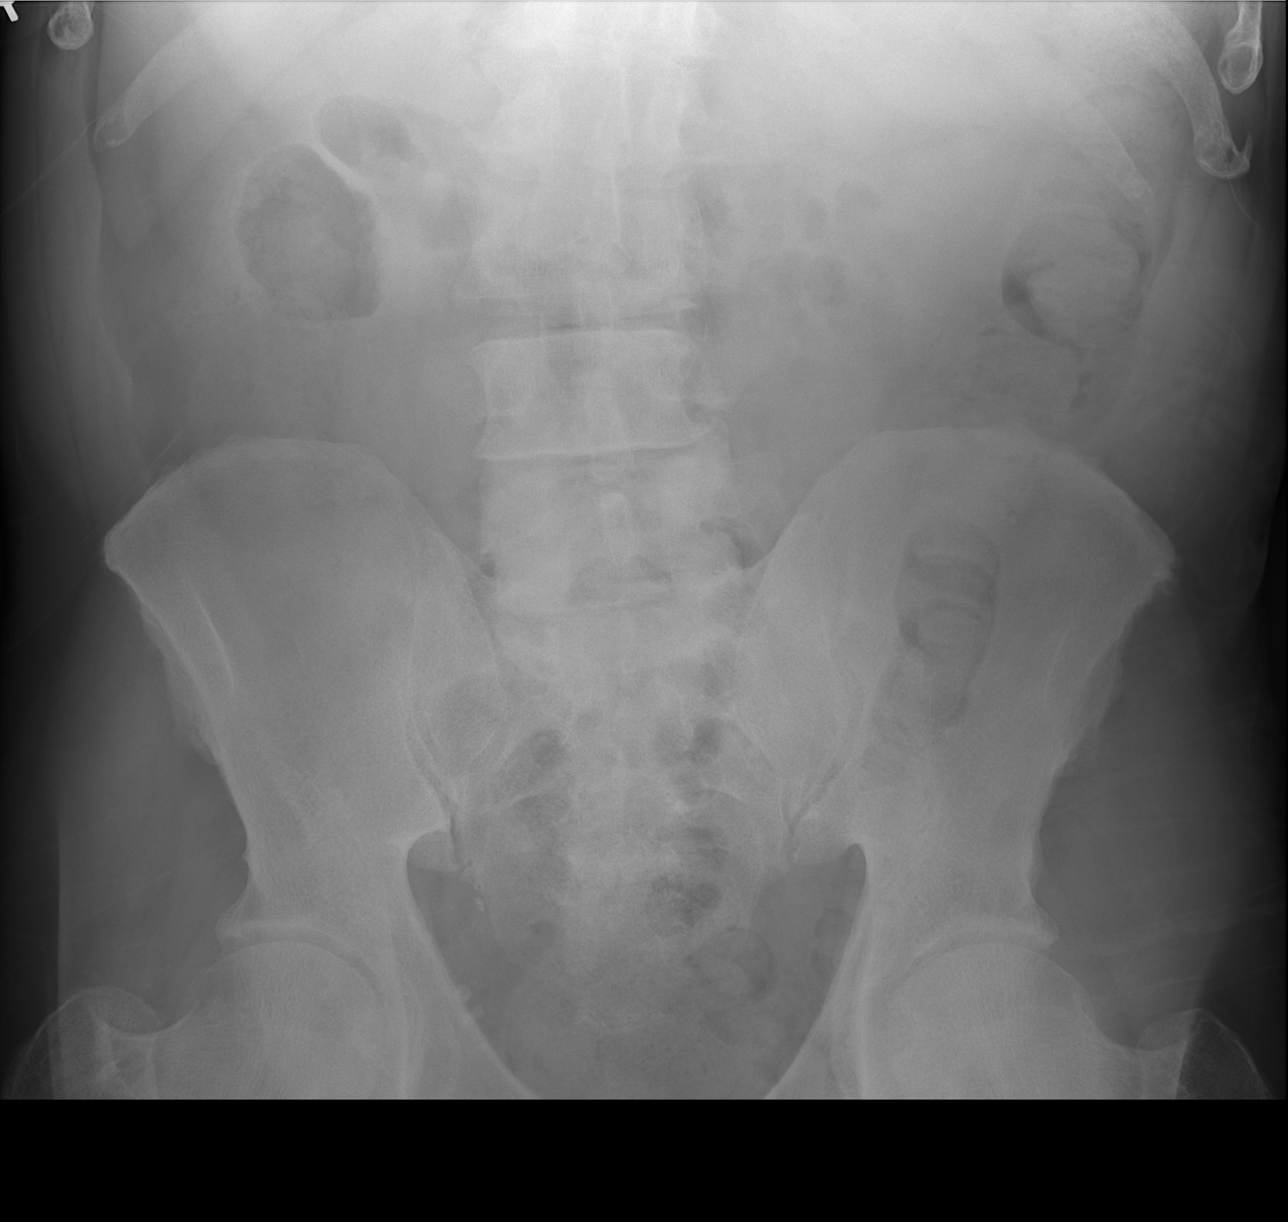

[x abdomen supine (2 of 2)]
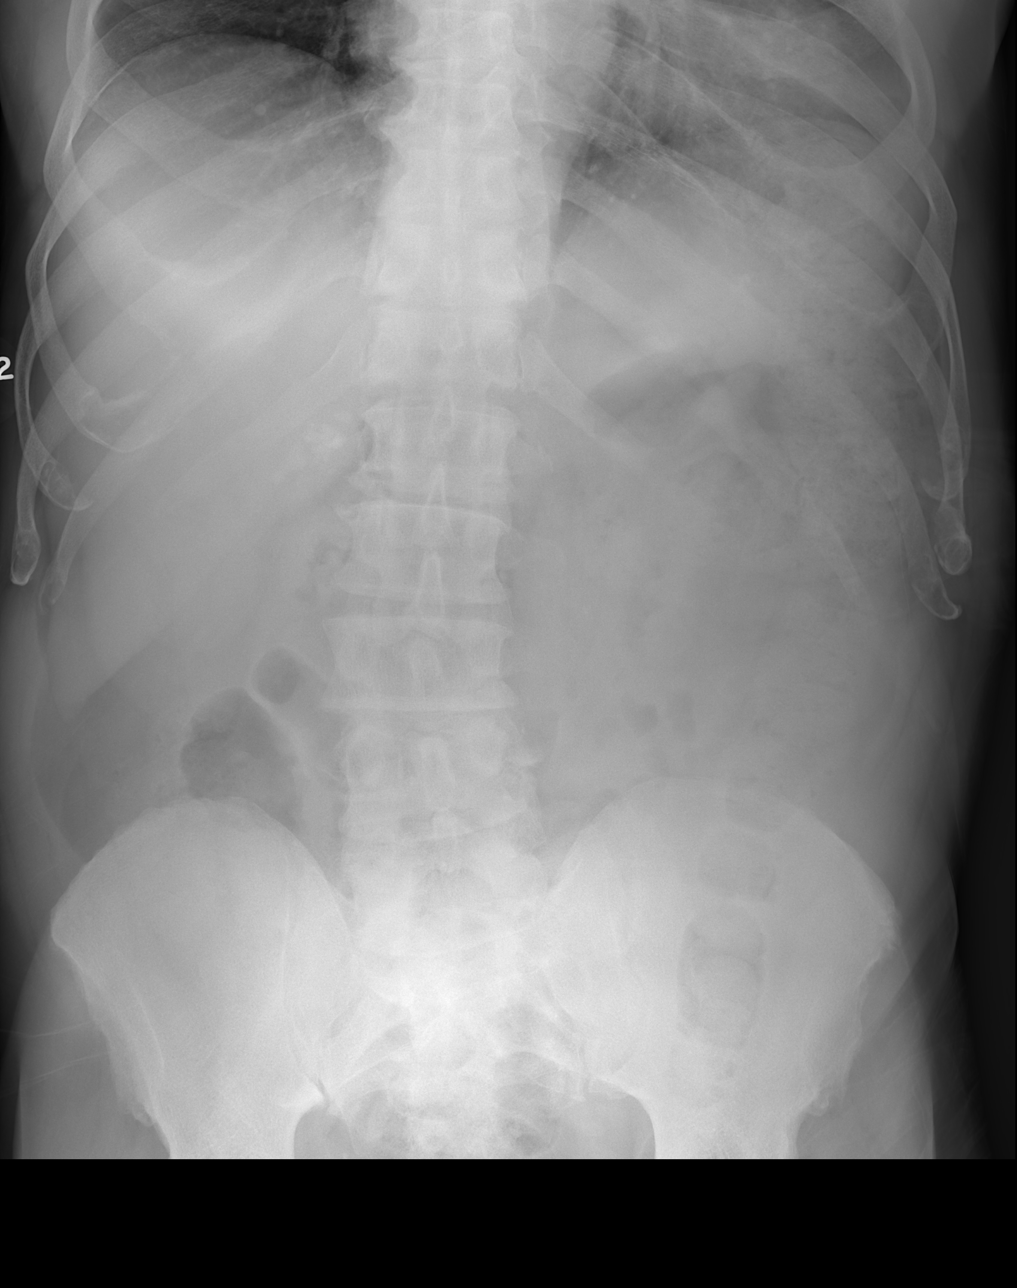

[4 of 4 positions shown; findings below may reference images not displayed]

FINDINGS: There is mild cardiomegaly. Mediastinal silhouette within normal
limits.

Lungs are normally inflated. No focal infiltrate, pulmonary edema,
or pleural effusion. There is no pneumothorax.

Visualized bowel gas pattern is within normal limits without
evidence of obstruction or ileus. No abnormal bowel wall thickening.
No free intraperitoneal air. By a large amount of retained stool
seen throughout the colon, suggesting constipation. No soft tissue
mass. Calcific densities overlying the right upper quadrant suggest
cholelithiasis.

Dextroscoliosis of the lumbar spine noted. No acute osseus
abnormality.
IMPRESSION: 1. Nonobstructive bowel gas pattern with no radiographic evidence of
acute intra-abdominal process.
2. Mildly large amount of retained stool within the colon,
suggesting constipation.
3. Cholelithiasis.
4. No acute cardiopulmonary abnormality.

## 2013-12-06 MED ORDER — DOCUSATE SODIUM 100 MG PO CAPS: 100.0000 mg | ORAL_CAPSULE | Freq: Two times a day (BID) | ORAL | Status: DC

## 2013-12-06 MED ORDER — MINERAL OIL RE ENEM: 1.0000 | ENEMA | Freq: Once | RECTAL | Status: DC

## 2013-12-06 NOTE — Discharge Instructions (Signed)
Please see your primary care doctor for constipation. Drink fluids, take the stool softener, and enema, only if you need to. See Dr. Junious Silk, Urology for your urinary retention. Keep the foley in. Come to the ER if there is any foley related problems, DO NOT TRY TO REMOVE THE FOLEY ON YOUR OWN.   Constipation Constipation is when a person has fewer than three bowel movements a week, has difficulty having a bowel movement, or has stools that are dry, hard, or larger than normal. As people grow older, constipation is more common. If you try to fix constipation with medicines that make you have a bowel movement (laxatives), the problem may get worse. Long-term laxative use may cause the muscles of the colon to become weak. A low-fiber diet, not taking in enough fluids, and taking certain medicines may make constipation worse.  CAUSES   Certain medicines, such as antidepressants, pain medicine, iron supplements, antacids, and water pills.   Certain diseases, such as diabetes, irritable bowel syndrome (IBS), thyroid disease, or depression.   Not drinking enough water.   Not eating enough fiber-rich foods.   Stress or travel.   Lack of physical activity or exercise.   Ignoring the urge to have a bowel movement.   Using laxatives too much.  SIGNS AND SYMPTOMS   Having fewer than three bowel movements a week.   Straining to have a bowel movement.   Having stools that are hard, dry, or larger than normal.   Feeling full or bloated.   Pain in the lower abdomen.   Not feeling relief after having a bowel movement.  DIAGNOSIS  Your health care provider will take a medical history and perform a physical exam. Further testing may be done for severe constipation. Some tests may include:  A barium enema X-ray to examine your rectum, colon, and, sometimes, your small intestine.   A sigmoidoscopy to examine your lower colon.   A colonoscopy to examine your entire  colon. TREATMENT  Treatment will depend on the severity of your constipation and what is causing it. Some dietary treatments include drinking more fluids and eating more fiber-rich foods. Lifestyle treatments may include regular exercise. If these diet and lifestyle recommendations do not help, your health care provider may recommend taking over-the-counter laxative medicines to help you have bowel movements. Prescription medicines may be prescribed if over-the-counter medicines do not work.  HOME CARE INSTRUCTIONS   Eat foods that have a lot of fiber, such as fruits, vegetables, whole grains, and beans.  Limit foods high in fat and processed sugars, such as french fries, hamburgers, cookies, candies, and soda.   A fiber supplement may be added to your diet if you cannot get enough fiber from foods.   Drink enough fluids to keep your urine clear or pale yellow.   Exercise regularly or as directed by your health care provider.   Go to the restroom when you have the urge to go. Do not hold it.   Only take over-the-counter or prescription medicines as directed by your health care provider. Do not take other medicines for constipation without talking to your health care provider first.  [place] IF:   You have bright red blood in your stool.   Your constipation lasts for more than 4 days or gets worse.   You have abdominal or rectal pain.   You have thin, pencil-like stools.   You have unexplained weight loss. MAKE SURE YOU:   Understand these instructions.  Will watch your condition.  Will get help right away if you are not doing well or get worse. Document Released: 02/17/2004 Document Revised: 05/26/2013 Document Reviewed: 03/02/2013 [facility] Patient Information 2015 [place], [place]. This information is not intended to replace advice given to you by your health care provider. Make sure you discuss any questions you have with your health care  provider.  Acute Urinary Retention Acute urinary retention is the temporary inability to urinate. This is a common problem in older men. As men age their prostates become larger and block the flow of urine from the bladder. This is usually a problem that has come on gradually.  HOME CARE INSTRUCTIONS If you are sent home with a Foley catheter and a drainage system, you will need to discuss the best course of action with your health care provider. While the catheter is in, maintain a good intake of fluids. Keep the drainage bag emptied and lower than your catheter. This is so that contaminated urine will not flow back into your bladder, which could lead to a urinary tract infection. There are two main types of drainage bags. One is a large bag that usually is used at night. It has a good capacity that will allow you to sleep through the night without having to empty it. The second type is called a leg bag. It has a smaller capacity, so it needs to be emptied more frequently. However, the main advantage is that it can be attached by a leg strap and can go underneath your clothing, allowing you the freedom to move about or leave your home. Only take over-the-counter or prescription medicines for pain, discomfort, or fever as directed by your health care provider.  SEEK MEDICAL CARE IF:  You develop a low-grade fever.  You experience spasms or leakage of urine with the spasms. SEEK IMMEDIATE MEDICAL CARE IF:   You develop chills or fever.  Your catheter stops draining urine.  Your catheter falls out.  You start to develop increased bleeding that does not respond to rest and increased fluid intake. MAKE SURE YOU:  Understand these instructions.  Will watch your condition.  Will get help right away if you are not doing well or get worse. Document Released: 08/27/2000 Document Revised: 05/26/2013 Document Reviewed: 10/30/2012 [facility] Patient Information 2015 [place], [place]. This information  is not intended to replace advice given to you by your health care provider. Make sure you discuss any questions you have with your health care provider.  Urodynamic Testing If you have a problem with urine leakage, urodynamic testing may be useful. This non-invasive test will be helpful to find out the cause of the problem. Problems in the urinary system can be caused by aging, illness, or injury. The muscles in your ureters, bladder, and urethra can become weaker with age. You may have more urinary infections and bladder stones because of the weakened bladder muscles. The muscles may not empty your bladder completely. Also, weakening of the urethral sphincter and the muscles of the pelvic floor can cause stress urinary continence. This is because the sphincter cannot remain tight enough to hold urine in the bladder or does not have enough support from the pelvic muscles. Urodynamics is the study of how the body stores and releases urine. These tests help your caregiver see how well your bladder and sphincter muscles work. The tests can help explain symptoms such as:  Inability to control your urine (incontinence).  Sudden, strong urges to urinate.  Painful urination.  Recurrent urinary tract infections.  Frequent urination.  Problems starting a urine stream.  Problems emptying your bladder completely. PREPARATION FOR TEST If your caregiver recommends bladder testing, usually no special preparations are needed. Make sure you understand any instructions you receive. Depending on the test, you may be asked to come with a full bladder or an empty one. Also, ask whether you should change your diet or skip your regular medicines and for how long. TAKING THE TEST Any procedure designed to provide information about a bladder problem can be called a urodynamic test. Your caregiver will want to know whether:  You have difficulty starting a urine stream.  How hard you have to strain to maintain  it.  The stream is interrupted.  Any urine is left in your bladder when you are done (post void residual). Urodynamic tests can range from simple observation to precise measurement using instruments. TESTING METHODS The type of test you take depends on your problem. The different tests are described below.  The use of imaging equipment. This equipment films urination.  Urinating behind a curtain while a doctor or nurse listens.  If leakage is the problem, a pad test is a simple way to measure how much urine seeps out. You will be given a number of absorbent pads and plastic bags. You will be told to wear the pad for 1 or 2 hours and then seal it in a bag. Your caregiver will then weigh the bags to see how much urine has been caught in the pad. A simpler, but less precise method is to change pads as often as you need to and keep track of how many pads you use in a day.  A physical exam will also be performed to rule out other causes of urinary problems. Other causes could include weakening pelvic muscles (pelvic prolapse) or prostate enlargement.  Pressure Flow Study-you will empty your bladder so a catheter can measure the pressures required to urinate. This study helps to identify causes of bladder outlet obstruction that men may experience with prostate enlargement. Bladder outlet obstruction is less common in women but can occur with a fallen bladder or rarely after a surgical procedure for urinary incontinence.  Electromyography (Measurement of Nerve Impulses)-If your caregiver thinks that your urinary problem is related to nerve damage, you may be given an electromyography. This test measures the muscle activity in the urethral sphincter. It uses sensors placed on the skin near the urethra and rectum. Muscle activity is recorded on a machine. The impulse patterns show if the messages sent to the bladder and urethra are coordinated correctly.  Video Urodynamics-can be performed with or  without equipment to take pictures of the bladder during filling and emptying. The imaging equipment may use X-rays or sound waves. If X-ray equipment is used, the liquid used to fill the bladder may be a contrast medium that will show up on the X-ray. The pictures and videos show the size and shape of the urinary tract and help your caregiver understand your problem. AFTER THE TEST You may have mild discomfort for a few hours after these tests. Drinking two, 8-ounce glasses of water, each hour for 2 hours should help. Ask your caregiver if you can take a warm bath. If not, you may be able to hold a warm, damp washcloth over the urethral opening. This may relieve discomfort. You may be given an antibiotic to prevent an infection. Call your caregiver if you have signs of infection. These signs include  pain, chills, or fever. OBTAINING THE TEST RESULTS Results for simple tests can be discussed with your caregiver immediately after the test. Results of other tests may take a few days. You will have time to ask questions about the results and possible treatments for your problem. It is your responsibility to obtain your test results. Ask the lab or department performing the test when and how you will get your results. FOR MORE INFORMATION  American Urological Association: www.auanet.[place] for Urologic Disease: www.afud.org Interstitial Cystitis Association of America: [place].it National Kidney and Urologic Diseases Information Clearinghouse: nkudic@info .AmenCredit.is Document Released: 03/18/2007 Document Revised: 08/13/2011 Document Reviewed: 03/26/2007 [employer] Patient Information 2015 [place], [place]. This information is not intended to replace advice given to you by your health care provider. Make sure you discuss any questions you have with your health care provider.

## 2013-12-06 NOTE — ED Notes (Signed)
The pt is c/o not having bms and not being able to urinate since this am.  Lower abd pain

## 2013-12-06 NOTE — ED Provider Notes (Signed)
CSN: [id number]     Arrival date & time 12/06/13  0153 History   First MD Initiated Contact with Patient 12/06/13 0210     Chief Complaint  Patient presents with  . Urinary Retention     (Consider location/radiation/quality/duration/timing/severity/associated sxs/prior Treatment) HPI Comments: 80 y/o comes in with cc of abdominal pain and inability to pass proper urine or stools the last few days. PT has no hx of similar complains and no hx of prostate problems or abd surgery. Pt has been having increased pressure to the bladder. He is passing flatus, but no BM x 2 days. No emesis, fevers, chill.  The history is provided by the patient.    Past Medical History  Diagnosis Date  . BENIGN PROSTATIC HYPERTROPHY 04/01/2008  . HYPERLIPIDEMIA 05/26/2007  . HYPERTENSION 02/07/2007  . TOBACCO ABUSE 04/01/2008   Past Surgical History  Procedure Laterality Date  . Tonsillectomy    . Knee arthroscopy     Family History  Problem Relation Age of Onset  . Arthritis Neg Hx     family  . Hypertension Neg Hx     family  . Stroke Neg Hx     family , 1st degree relative   History  Substance Use Topics  . Smoking status: Current Every Day Smoker -- 0.30 packs/day    Types: Cigarettes  . Smokeless tobacco: Never Used  . Alcohol Use: No    Review of Systems  Constitutional: Positive for activity change. Negative for fever and unexpected weight change.  Respiratory: Negative for shortness of breath.   Cardiovascular: Negative for chest pain.  Gastrointestinal: Positive for abdominal pain and abdominal distention. Negative for nausea and vomiting.  Genitourinary: Positive for decreased urine volume. Negative for frequency, hematuria and flank pain.      Allergies  Review of patient's allergies indicates no known allergies.  Home Medications   Prior to Admission medications   Medication Sig Start Date End Date Taking? Authorizing Provider  hydrochlorothiazide (HYDRODIURIL) 25 MG  tablet Take 25 mg by mouth daily.   Yes Historical Provider, MD  lisinopril (PRINIVIL,ZESTRIL) 20 MG tablet Take 20 mg by mouth daily.   Yes Historical Provider, MD  docusate sodium (COLACE) 100 MG capsule Take 1 capsule (100 mg total) by mouth every 12 (twelve) hours. 12/06/13   Varney Biles, MD  mineral oil enema Place 133 mLs (1 enema total) rectally once. 12/06/13   Guillermo Nehring, MD   BP 168/81  Pulse 95  Temp(Src) 97.9 F (36.6 C) (Oral)  Resp 16  SpO2 98% Physical Exam  Nursing note and vitals reviewed. Constitutional: He appears well-developed.  HENT:  Head: Normocephalic.  Eyes: Conjunctivae are normal.  Neck: Neck supple.  Cardiovascular: Normal rate.   Pulmonary/Chest: Effort normal.  Abdominal: Soft. He exhibits distension. There is tenderness.  reducible inguinal hernia  Skin: Skin is warm.    ED Course  Procedures (including critical care time) Labs Review Labs Reviewed  CBC WITH DIFFERENTIAL - Abnormal; Notable for the following:    WBC 10.6 (*)    Hemoglobin 12.5 (*)    HCT 37.3 (*)    Neutrophils Relative % 81 (*)    Neutro Abs 8.6 (*)    Lymphocytes Relative 11 (*)    All other components within normal limits  BASIC METABOLIC PANEL - Abnormal; Notable for the following:    Glucose, Bld 122 (*)    BUN 35 (*)    Creatinine, Ser 1.70 (*)    GFR calc  non Af Amer 36 (*)    GFR calc Af Amer 42 (*)    Anion gap 16 (*)    All other components within normal limits  URINALYSIS, ROUTINE W REFLEX MICROSCOPIC - Abnormal; Notable for the following:    Hgb urine dipstick MODERATE (*)    All other components within normal limits  URINE MICROSCOPIC-ADD ON - Abnormal; Notable for the following:    Squamous Epithelial / LPF FEW (*)    Bacteria, UA FEW (*)    Casts HYALINE CASTS (*)    All other components within normal limits    Imaging Review Dg Abd Acute W/chest  12/06/2013   CLINICAL DATA:  abd pain and distention  EXAM: ACUTE ABDOMEN SERIES (ABDOMEN 2 VIEW &  CHEST 1 VIEW)  COMPARISON:  Prior CT from 08/07/2011  FINDINGS: There is mild cardiomegaly. Mediastinal silhouette within normal limits.  Lungs are normally inflated. No focal infiltrate, pulmonary edema, or pleural effusion. There is no pneumothorax.  Visualized bowel gas pattern is within normal limits without evidence of obstruction or ileus. No abnormal bowel wall thickening. No free intraperitoneal air. By a large amount of retained stool seen throughout the colon, suggesting constipation. No soft tissue mass. Calcific densities overlying the right upper quadrant suggest cholelithiasis.  Dextroscoliosis of the lumbar spine noted. No acute osseus abnormality.  IMPRESSION: 1. Nonobstructive bowel gas pattern with no radiographic evidence of acute intra-abdominal process. 2. Mildly large amount of retained stool within the colon, suggesting constipation. 3. Cholelithiasis. 4. No acute cardiopulmonary abnormality.   Electronically Signed   By: Jeannine Boga M.D.   On: 12/06/2013 04:19     EKG Interpretation None      MDM   Final diagnoses:  Urinary retention  Constipation - functional    Pt with urinary retention and constipation. AAS shows constipation. Will d./c with stool softener, and pcp f/u advised.  PT has > 400 cc per bladder scan, and so 16 Fr foley catheter was placed - and there was instant 680 cc urine output.  Will d.c with Urology f/u and send patient with foley. Foley education provided by Therapist, sports.  Varney Biles, MD 12/06/13 [phone]

## 2013-12-06 NOTE — ED Notes (Signed)
Patient provided with leg bag and backup leg bag. Education provided regarding foley. Patient verbalized understanding.

## 2014-05-25 ENCOUNTER — Encounter: Payer: Self-pay | Admitting: Internal Medicine

## 2014-05-25 ENCOUNTER — Ambulatory Visit (INDEPENDENT_AMBULATORY_CARE_PROVIDER_SITE_OTHER): Payer: Medicare Other | Admitting: Internal Medicine

## 2014-05-25 VITALS — BP 150/90 | HR 88 | Temp 98.5°F | Resp 20 | Ht 70.5 in | Wt 185.0 lb

## 2014-05-25 DIAGNOSIS — Z720 Tobacco use: Secondary | ICD-10-CM

## 2014-05-25 DIAGNOSIS — I10 Essential (primary) hypertension: Secondary | ICD-10-CM

## 2014-05-25 DIAGNOSIS — E785 Hyperlipidemia, unspecified: Secondary | ICD-10-CM

## 2014-05-25 DIAGNOSIS — F17200 Nicotine dependence, unspecified, uncomplicated: Secondary | ICD-10-CM

## 2014-05-25 DIAGNOSIS — Z0000 Encounter for general adult medical examination without abnormal findings: Secondary | ICD-10-CM

## 2014-05-25 LAB — COMPREHENSIVE METABOLIC PANEL
ALBUMIN: 4 g/dL (ref 3.5–5.2)
ALT: 19 U/L (ref 0–53)
AST: 43 U/L — AB (ref 0–37)
Alkaline Phosphatase: 84 U/L (ref 39–117)
BILIRUBIN TOTAL: 0.7 mg/dL (ref 0.2–1.2)
BUN: 25 mg/dL — AB (ref 6–23)
CO2: 23 mEq/L (ref 19–32)
Calcium: 9.1 mg/dL (ref 8.4–10.5)
Chloride: 105 mEq/L (ref 96–112)
Creatinine, Ser: 1.8 mg/dL — ABNORMAL HIGH (ref 0.4–1.5)
GFR: 48.31 mL/min — ABNORMAL LOW (ref >60.00)
Glucose, Bld: 96 mg/dL (ref 70–99)
POTASSIUM: 3.6 meq/L (ref 3.5–5.1)
Sodium: 137 mEq/L (ref 135–145)
Total Protein: 7.7 g/dL (ref 6.0–8.3)

## 2014-05-25 LAB — CBC WITH DIFFERENTIAL/PLATELET
BASOS PCT: 1 % (ref 0.0–3.0)
Basophils Absolute: 0.1 10*3/uL (ref 0.0–0.1)
EOS ABS: 0.3 10*3/uL (ref 0.0–0.7)
Eosinophils Relative: 3.9 % (ref 0.0–5.0)
HEMATOCRIT: 39.8 % (ref 39.0–52.0)
Hemoglobin: 12.6 g/dL — ABNORMAL LOW (ref 13.0–17.0)
LYMPHS ABS: 1.4 10*3/uL (ref 0.7–4.0)
Lymphocytes Relative: 17.7 % (ref 12.0–46.0)
MCHC: 31.6 g/dL (ref 30.0–36.0)
MCV: 82.8 fl (ref 78.0–100.0)
Monocytes Absolute: 0.5 10*3/uL (ref 0.1–1.0)
Monocytes Relative: 6.1 % (ref 3.0–12.0)
NEUTROS PCT: 71.3 % (ref 43.0–77.0)
Neutro Abs: 5.5 10*3/uL (ref 1.4–7.7)
Platelets: 196 10*3/uL (ref 150.0–400.0)
RBC: 4.81 Mil/uL (ref 4.22–5.81)
RDW: 15 % (ref 11.5–15.5)
WBC: 7.7 10*3/uL (ref 4.0–10.5)

## 2014-05-25 NOTE — Progress Notes (Signed)
Pre visit review using our clinic review tool, if applicable. No additional management support is needed unless otherwise documented below in the visit note. 

## 2014-05-25 NOTE — Progress Notes (Signed)
Patient ID: Isaac Phelps, male   DOB: 1930/11/04, 80 y.o.   MRN: [id number]  Subjective:    Patient ID: Isaac Phelps, male    DOB: [birth year], 80 y.o.   MRN: [id number]  Hypertension Pertinent negatives include no chest pain, headaches, neck pain, palpitations or shortness of breath.  Hyperlipidemia Pertinent negatives include no chest pain, myalgias or shortness of breath.  Pre-visit discussion using our clinic review tool. No additional management support is needed unless otherwise documented below in the visit note.  History of Present Illness:   80 year-old patient who is seen today for a comprehensive evaluation. He has a history of treated hypertension and dyslipidemia, and does remarkably well  He was evaluated earlier  2 years ago for a right-sided chest wall pain. This included a chest CT that revealed a nondistinct  lesion involving the superior aspect of the left lower lobe. A followup CT scan was recommended. This again was discussed with the patient who now is symptom-free he does not wish to proceed with a followup CT scan at this time;  he is aware of the possibility of early lung cancer  Here for Medicare AWV:   1. Risk factors based on Past M, S, F history: cardiovascular risk factors include hypertension, and dyslipidemia. As well as tobacco use 2. 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 Also walks daily  3. Depression/mood: no history of depression or mood disorder  4. Hearing: no deficits  5. ADL's: independent in all aspects of daily living  6. Fall Risk: low  7. Home Safety: no problems identified  8. Height, weight, &visual acuity:height and weight stable has had recent cataract extraction surgery and lens implantation. Very pleased with the outcome  9. Counseling: heart healthy, salt restricted diet. Encouraged  10. Labs ordered based on risk factors: laboratory profile, including lipid panel will be reviewed  11. Referral  Coordination- not appropriate at this time ;has declined screening colonoscopies  12. Care Plan- continue, heart healthy diet, exercise regimen  13. Cognitive Assessment- alert and oriented, with normal affect. No history of memory dysfunction. Handles all executive functioning without difficulty  14.  Preventive services will include annual health examinations.  Total smoking cessation encouraged. previously 15.  Provider list updated.  Includes primary care and ophthalmology  Preventive Screening-Counseling & Management  Alcohol-Tobacco  Smoking Status: current  Smoking Cessation Counseling: yes   Current Problems (verified):   1) Preventive Health Care (ICD-V70.0)  2) Tobacco Abuse (ICD-305.1)  3) Benign Prostatic Hypertrophy (ICD-600.00)  4) Benign Prostatic Hypertrophy, Hx of (ICD-V13.8)  5) Hyperlipidemia (ICD-272.4)  6) Hypertension (ICD-401.9)   Current Medications (verified):  1) Simvastatin 40 Mg Tabs (Simvastatin) .... 1/2 Once Daily  2) Lisinopril-Hydrochlorothiazide 20-25 Mg Tabs (Lisinopril-Hydrochlorothiazide) .... One Daily   Allergies (verified):  No Known Drug Allergies   Contraindications/Deferment of Procedures/Staging:  Test/Procedure: Pneumovax vaccine  Reason for deferment: patient declined   Past History:  Past Medical History:   Hypertension  Hyperlipidemia  ongoing tobacco use  Benign prostatic hypertrophy   Past Surgical History:   Tonsillectomy  Arthroscopic Knee surgery  no prior colonoscopy-declines   Family History:   Family History of Arthritis  Family History Hypertension  Family History of Stroke M 1st degree relative <50  father died age 27 of probable CVA  mother died at age 14  One brother one sister in good health. No family history cancer   Social History:    Retired  works part-time as a Presenter, broadcasting  Married  Current Smoker  one child    Review of Systems  Constitutional: Negative for fever, chills, activity  change, appetite change and fatigue.  HENT: Negative for congestion, dental problem, ear pain, hearing loss, mouth sores, rhinorrhea, sinus pressure, sneezing, tinnitus, trouble swallowing and voice change.   Eyes: Negative for photophobia, pain, redness and visual disturbance.  Respiratory: Negative for apnea, cough, choking, chest tightness, shortness of breath and wheezing.   Cardiovascular: Negative for chest pain, palpitations and leg swelling.  Gastrointestinal: Negative for nausea, vomiting, abdominal pain, diarrhea, constipation, blood in stool, abdominal distention, anal bleeding and rectal pain.  Genitourinary: Negative for dysuria, urgency, frequency, hematuria, flank pain, decreased urine volume, discharge, penile swelling, scrotal swelling, difficulty urinating, genital sores and testicular pain.  Musculoskeletal: Negative for myalgias, back pain, joint swelling, arthralgias, gait problem, neck pain and neck stiffness.       Chronic left shoulder pain  Skin: Negative for color change, rash and wound.  Neurological: Negative for dizziness, tremors, seizures, syncope, facial asymmetry, speech difficulty, weakness, light-headedness, numbness and headaches.  Hematological: Negative for adenopathy. Does not bruise/bleed easily.  Psychiatric/Behavioral: Negative for suicidal ideas, hallucinations, behavioral problems, confusion, sleep disturbance, self-injury, dysphoric mood, decreased concentration and agitation. The patient is not nervous/anxious.        Objective:   Physical Exam  Constitutional: He appears well-developed and well-nourished.  Repeat blood pressure 140/82  HENT:  Head: Normocephalic and atraumatic.  Right Ear: External ear normal.  Left Ear: External ear normal.  Nose: Nose normal.  Mouth/Throat: Oropharynx is clear and moist.  Eyes: Conjunctivae and EOM are normal. Pupils are equal, round, and reactive to light. No scleral icterus.  Neck: Normal range of motion.  Neck supple. No JVD present. No thyromegaly present.  Cardiovascular: Regular rhythm, normal heart sounds and intact distal pulses.  Exam reveals no gallop and no friction rub.   No murmur heard. Decreased left dorsalis pedis pulse  Pulmonary/Chest: Effort normal and breath sounds normal. He exhibits no tenderness.  Abdominal: Soft. Bowel sounds are normal. He exhibits no distension and no mass. There is no tenderness.  Bilateral femoral bruits  Genitourinary: Penis normal.  Musculoskeletal: Normal range of motion. He exhibits no edema or tenderness.  Lymphadenopathy:    He has no cervical adenopathy.  Neurological: He is alert. He has normal reflexes. No cranial nerve deficit. Coordination normal.  Skin: Skin is warm and dry. No rash noted.  Non-Pruritic scattered nonblanching macular rash most prominent over the upper back, anterior chest and arms.  Also present in the groin area  Psychiatric: He has a normal mood and affect. His behavior is normal.          Assessment & Plan:   Preventive health Hypertension stable BPH Dyslipidemia Ongoing tobacco use  Laboratory update will be obtained Medications refilled Smoking cessation encouraged Recheck one year

## 2014-05-25 NOTE — Patient Instructions (Addendum)
Limit your sodium (Salt) intake    It is important that you exercise regularly, at least 20 minutes 3 to 4 times per week.  If you develop chest pain or shortness of breath seek  medical attention.  Return in one year for follow-up  Please check your blood pressure on a regular basis.  If it is consistently greater than 150/90, please make an office appointment. Health Maintenance A healthy lifestyle and preventative care can promote health and wellness.  Maintain regular health, dental, and eye exams.  Eat a healthy diet. Foods like vegetables, fruits, whole grains, low-fat dairy products, and lean protein foods contain the nutrients you need and are low in calories. Decrease your intake of foods high in solid fats, added sugars, and salt. Get information about a proper diet from your health care provider, if necessary.  Regular physical exercise is one of the most important things you can do for your health. Most adults should get at least 150 minutes of moderate-intensity exercise (any activity that increases your heart rate and causes you to sweat) each week. In addition, most adults need muscle-strengthening exercises on 2 or more days a week.   Maintain a healthy weight. The body mass index (BMI) is a screening tool to identify possible weight problems. It provides an estimate of body fat based on height and weight. Your health care provider can find your BMI and can help you achieve or maintain a healthy weight. For males 20 years and older:  A BMI below 18.5 is considered underweight.  A BMI of 18.5 to 24.9 is normal.  A BMI of 25 to 29.9 is considered overweight.  A BMI of 30 and above is considered obese.  Maintain normal blood lipids and cholesterol by exercising and minimizing your intake of saturated fat. Eat a balanced diet with plenty of fruits and vegetables. Blood tests for lipids and cholesterol should begin at age 99 and be repeated every 5 years. If your lipid or  cholesterol levels are high, you are over age 51, or you are at high risk for heart disease, you may need your cholesterol levels checked more frequently.Ongoing high lipid and cholesterol levels should be treated with medicines if diet and exercise are not working.  If you smoke, find out from your health care provider how to quit. If you do not use tobacco, do not start.  Lung cancer screening is recommended for adults aged 28-80 years who are at high risk for developing lung cancer because of a history of smoking. A yearly low-dose CT scan of the lungs is recommended for people who have at least a 30-pack-year history of smoking and are current smokers or have quit within the past 15 years. A pack year of smoking is smoking an average of 1 pack of cigarettes a day for 1 year (for example, a 30-pack-year history of smoking could mean smoking 1 pack a day for 30 years or 2 packs a day for 15 years). Yearly screening should continue until the smoker has stopped smoking for at least 15 years. Yearly screening should be stopped for people who develop a health problem that would prevent them from having lung cancer treatment.  If you choose to drink alcohol, do not have more than 2 drinks per day. One drink is considered to be 12 oz (360 mL) of beer, 5 oz (150 mL) of wine, or 1.5 oz (45 mL) of liquor.  Avoid the use of street drugs. Do not share needles with  anyone. Ask for help if you need support or instructions about stopping the use of drugs.  High blood pressure causes heart disease and increases the risk of stroke. Blood pressure should be checked at least every 1-2 years. Ongoing high blood pressure should be treated with medicines if weight loss and exercise are not effective.  If you are 58-59 years old, ask your health care provider if you should take aspirin to prevent heart disease.  Diabetes screening involves taking a blood sample to check your fasting blood sugar level. This should be done  once every 3 years after age 36 if you are at a normal weight and without risk factors for diabetes. Testing should be considered at a younger age or be carried out more frequently if you are overweight and have at least 1 risk factor for diabetes.  Colorectal cancer can be detected and often prevented. Most routine colorectal cancer screening begins at the age of 68 and continues through age 58. However, your health care provider may recommend screening at an earlier age if you have risk factors for colon cancer. On a yearly basis, your health care provider may provide home test kits to check for hidden blood in the stool. A small camera at the end of a tube may be used to directly examine the colon (sigmoidoscopy or colonoscopy) to detect the earliest forms of colorectal cancer. Talk to your health care provider about this at age 98 when routine screening begins. A direct exam of the colon should be repeated every 5-10 years through age 44, unless early forms of precancerous polyps or small growths are found.  People who are at an increased risk for hepatitis B should be screened for this virus. You are considered at high risk for hepatitis B if:  You were born in a country where hepatitis B occurs often. Talk with your health care provider about which countries are considered high risk.  Your parents were born in a high-risk country and you have not received a shot to protect against hepatitis B (hepatitis B vaccine).  You have HIV or AIDS.  You use needles to inject street drugs.  You live with, or have sex with, someone who has hepatitis B.  You are a man who has sex with other men (MSM).  You get hemodialysis treatment.  You take certain medicines for conditions like cancer, organ transplantation, and autoimmune conditions.  Hepatitis C blood testing is recommended for all people born from [birth year] through 1963 and any individual with known risk factors for hepatitis C.  Healthy men should  no longer receive prostate-specific antigen (PSA) blood tests as part of routine cancer screening. Talk to your health care provider about prostate cancer screening.  Testicular cancer screening is not recommended for adolescents or adult males who have no symptoms. Screening includes self-exam, a health care provider exam, and other screening tests. Consult with your health care provider about any symptoms you have or any concerns you have about testicular cancer.  Practice safe sex. Use condoms and avoid high-risk sexual practices to reduce the spread of sexually transmitted infections (STIs).  You should be screened for STIs, including gonorrhea and chlamydia if:  You are sexually active and are younger than 24 years.  You are older than 24 years, and your health care provider tells you that you are at risk for this type of infection.  Your sexual activity has changed since you were last screened, and you are at an increased risk for  chlamydia or gonorrhea. Ask your health care provider if you are at risk.  If you are at risk of being infected with HIV, it is recommended that you take a prescription medicine daily to prevent HIV infection. This is called pre-exposure prophylaxis (PrEP). You are considered at risk if:  You are a man who has sex with other men (MSM).  You are a heterosexual man who is sexually active with multiple partners.  You take drugs by injection.  You are sexually active with a partner who has HIV.  Talk with your health care provider about whether you are at high risk of being infected with HIV. If you choose to begin PrEP, you should first be tested for HIV. You should then be tested every 3 months for as long as you are taking PrEP.  Use sunscreen. Apply sunscreen liberally and repeatedly throughout the day. You should seek shade when your shadow is shorter than you. Protect yourself by wearing long sleeves, pants, a wide-brimmed hat, and sunglasses year round  whenever you are outdoors.  Tell your health care provider of new moles or changes in moles, especially if there is a change in shape or color. Also, tell your health care provider if a mole is larger than the size of a pencil eraser.  A one-time screening for abdominal aortic aneurysm (AAA) and surgical repair of large AAAs by ultrasound is recommended for men aged 84-75 years who are current or former smokers.  Stay current with your vaccines (immunizations). Document Released: 11/17/2007 Document Revised: 05/26/2013 Document Reviewed: 10/16/2010 [facility] Patient Information 2015 [place], [place]. This information is not intended to replace advice given to you by your health care provider. Make sure you discuss any questions you have with your health care provider.

## 2014-05-26 ENCOUNTER — Telehealth: Payer: Self-pay | Admitting: Internal Medicine

## 2014-05-26 NOTE — Telephone Encounter (Signed)
emmi mailed  °

## 2014-05-26 NOTE — Telephone Encounter (Signed)
emmi emailed °

## 2014-06-07 ENCOUNTER — Other Ambulatory Visit: Payer: Self-pay | Admitting: Internal Medicine

## 2015-02-05 ENCOUNTER — Emergency Department (INDEPENDENT_AMBULATORY_CARE_PROVIDER_SITE_OTHER)
Admission: EM | Admit: 2015-02-05 | Discharge: 2015-02-05 | Disposition: A | Discharge status: Home / Self Care | Payer: Medicare Other | Source: Home / Self Care | Attending: Family Medicine | Admitting: Family Medicine

## 2015-02-05 ENCOUNTER — Encounter (HOSPITAL_COMMUNITY): Payer: Self-pay | Admitting: Emergency Medicine

## 2015-02-05 DIAGNOSIS — L03115 Cellulitis of right lower limb: Secondary | ICD-10-CM | POA: Diagnosis not present

## 2015-02-05 DIAGNOSIS — I10 Essential (primary) hypertension: Secondary | ICD-10-CM

## 2015-02-05 MED ORDER — CLINDAMYCIN HCL 300 MG PO CAPS: 300.0000 mg | ORAL_CAPSULE | Freq: Three times a day (TID) | ORAL | Status: DC

## 2015-02-05 NOTE — ED Notes (Addendum)
Pt comes in with right  lower cellulitis with oozing yellow, clear drainage with pus pockets from possible insect bite Occurred last week Burning pain noted with itching BP elevated 180/87 81, afebrile Last took BP medication Friday

## 2015-02-05 NOTE — Discharge Instructions (Signed)
You have a deep skin infection called cellulitis. Please start the clindamycin as prescribed. Please take your blood pressure medicines as prescribed. This will take several days to weeks to heal.

## 2015-02-05 NOTE — ED Provider Notes (Signed)
CSN: [id number]     Arrival date & time 02/05/15  1352 History   First MD Initiated Contact with Patient 02/05/15 1414     Chief Complaint  Patient presents with  . Cellulitis   (Consider location/radiation/quality/duration/timing/severity/associated sxs/prior Treatment) HPI  Right lower leg infection. Started 1 week ago. Patient states he got "scratched, "which then turned into pain and redness over the course of several days. Postop ointment without improvement. Constant. Getting worse. Radiation of redness proximally and distally from initial area of wound on medial side of right lower leg. Denies any fevers, nausea, vomiting, rash, other joint or skin involvement. Patient did not take his blood pressure medication as prescribed this morning as he states he sometimes forgets. Denies any chest pain, palpitations, headache    Past Medical History  Diagnosis Date  . BENIGN PROSTATIC HYPERTROPHY 04/01/2008  . HYPERLIPIDEMIA 05/26/2007  . HYPERTENSION 02/07/2007  . TOBACCO ABUSE 04/01/2008   Past Surgical History  Procedure Laterality Date  . Tonsillectomy    . Knee arthroscopy     Family History  Problem Relation Age of Onset  . Arthritis Neg Hx     family  . Hypertension Neg Hx     family  . Stroke Neg Hx     family , 1st degree relative   Social History  Substance Use Topics  . Smoking status: Current Every Day Smoker -- 0.30 packs/day    Types: Cigarettes  . Smokeless tobacco: Never Used  . Alcohol Use: No    Review of Systems Per HPI with all other pertinent systems negative.   Allergies  Review of patient's allergies indicates no known allergies.  Home Medications   Prior to Admission medications   Medication Sig Start Date End Date Taking? Authorizing Provider  hydrochlorothiazide (HYDRODIURIL) 25 MG tablet Take 25 mg by mouth daily.   Yes Historical Provider, MD  lisinopril-hydrochlorothiazide (PRINZIDE,ZESTORETIC) 20-25 MG per tablet take 1 tablet by mouth  once daily 06/07/14  Yes Marletta Lor, MD  clindamycin (CLEOCIN) 300 MG capsule Take 1 capsule (300 mg total) by mouth 3 (three) times daily. 02/05/15   Waldemar Dickens, MD  clobetasol ointment (TEMOVATE) 3.15 % Apply 1 application topically daily as needed.  05/21/14   Historical Provider, MD  lisinopril (PRINIVIL,ZESTRIL) 20 MG tablet Take 20 mg by mouth daily.    Historical Provider, MD  simvastatin (ZOCOR) 40 MG tablet take 1/2 tablet by mouth once daily 06/07/14   Marletta Lor, MD   Meds Ordered and Administered this Visit  Medications - No data to display  BP 180/87 mmHg  Pulse 81  Temp(Src) 97.7 F (36.5 C) (Oral)  Resp 20  SpO2 95% No data found.   Physical Exam Physical Exam  Constitutional: oriented to person, place, and time. appears well-developed and well-nourished. No distress.  HENT:  Head: Normocephalic and atraumatic.  Eyes: EOMI. PERRL.  Neck: Normal range of motion.  Cardiovascular: RRR, no m/r/g, 2+ distal pulses,  Pulmonary/Chest: Effort normal and breath sounds normal. No respiratory distress.  Abdominal: Soft. Bowel sounds are normal. NonTTP, no distension.  Musculoskeletal: Normal range of motion. Non ttp, no effusion.  Neurological: alert and oriented to person, place, and time.  Skin: Right lower extremity with marked erythema and induration along the distal medial and anterior portion of the leg with area of skin excoriation and maceration medially approximately 1 cm in size. Few vesicles with serous fluid.Marland Kitchen  Psychiatric: normal mood and affect. behavior is normal. Judgment and  thought content normal.   ED Course  Procedures (including critical care time)  Labs Review Labs Reviewed - No data to display  Imaging Review No results found.   Visual Acuity Review  Right Eye Distance:   Left Eye Distance:   Bilateral Distance:    Right Eye Near:   Left Eye Near:    Bilateral Near:         MDM   1. Cellulitis of right leg   2.  Essential hypertension    Cellulitis. Clear vesicles drained per patient's request. Silvadene ointment and bandage applied. Start clindamycin. Wound culture sent as there is some purulent discharge anteriorly. No clear abscess to drain. Reiterated importance of patient to take his antihypertensives and he will do so. Counseled patient to quit smoking.   Waldemar Dickens, MD 02/05/15 1435

## 2015-02-08 LAB — WOUND CULTURE
Culture: NO GROWTH
Gram Stain: NONE SEEN

## 2015-02-09 ENCOUNTER — Emergency Department (HOSPITAL_COMMUNITY): Payer: Medicare Other

## 2015-02-09 ENCOUNTER — Emergency Department (INDEPENDENT_AMBULATORY_CARE_PROVIDER_SITE_OTHER)
Admission: EM | Admit: 2015-02-09 | Discharge: 2015-02-09 | Disposition: A | Discharge status: Nursing Home (Includes ICF) | Payer: Medicare Other | Source: Home / Self Care | Attending: Emergency Medicine | Admitting: Emergency Medicine

## 2015-02-09 ENCOUNTER — Encounter (HOSPITAL_COMMUNITY): Payer: Self-pay | Admitting: Emergency Medicine

## 2015-02-09 ENCOUNTER — Inpatient Hospital Stay (HOSPITAL_COMMUNITY)
Admission: EM | Admit: 2015-02-09 | Discharge: 2015-02-14 | DRG: 603 | Disposition: A | Discharge status: Home / Self Care | Payer: Medicare Other | Attending: Internal Medicine | Admitting: Internal Medicine

## 2015-02-09 ENCOUNTER — Encounter (HOSPITAL_COMMUNITY): Payer: Self-pay | Admitting: Family Medicine

## 2015-02-09 DIAGNOSIS — R21 Rash and other nonspecific skin eruption: Secondary | ICD-10-CM | POA: Diagnosis present

## 2015-02-09 DIAGNOSIS — Z8249 Family history of ischemic heart disease and other diseases of the circulatory system: Secondary | ICD-10-CM

## 2015-02-09 DIAGNOSIS — Z823 Family history of stroke: Secondary | ICD-10-CM

## 2015-02-09 DIAGNOSIS — Z8261 Family history of arthritis: Secondary | ICD-10-CM

## 2015-02-09 DIAGNOSIS — E785 Hyperlipidemia, unspecified: Secondary | ICD-10-CM | POA: Diagnosis present

## 2015-02-09 DIAGNOSIS — I82409 Acute embolism and thrombosis of unspecified deep veins of unspecified lower extremity: Secondary | ICD-10-CM

## 2015-02-09 DIAGNOSIS — I129 Hypertensive chronic kidney disease with stage 1 through stage 4 chronic kidney disease, or unspecified chronic kidney disease: Secondary | ICD-10-CM | POA: Diagnosis present

## 2015-02-09 DIAGNOSIS — N183 Chronic kidney disease, stage 3 unspecified: Secondary | ICD-10-CM | POA: Diagnosis present

## 2015-02-09 DIAGNOSIS — L0390 Cellulitis, unspecified: Secondary | ICD-10-CM | POA: Diagnosis present

## 2015-02-09 DIAGNOSIS — N400 Enlarged prostate without lower urinary tract symptoms: Secondary | ICD-10-CM | POA: Diagnosis present

## 2015-02-09 DIAGNOSIS — F17210 Nicotine dependence, cigarettes, uncomplicated: Secondary | ICD-10-CM | POA: Diagnosis present

## 2015-02-09 DIAGNOSIS — R609 Edema, unspecified: Secondary | ICD-10-CM | POA: Diagnosis not present

## 2015-02-09 DIAGNOSIS — N289 Disorder of kidney and ureter, unspecified: Secondary | ICD-10-CM | POA: Diagnosis not present

## 2015-02-09 DIAGNOSIS — L03115 Cellulitis of right lower limb: Secondary | ICD-10-CM | POA: Diagnosis present

## 2015-02-09 DIAGNOSIS — I10 Essential (primary) hypertension: Secondary | ICD-10-CM | POA: Diagnosis present

## 2015-02-09 DIAGNOSIS — M7989 Other specified soft tissue disorders: Secondary | ICD-10-CM | POA: Diagnosis not present

## 2015-02-09 DIAGNOSIS — B9789 Other viral agents as the cause of diseases classified elsewhere: Secondary | ICD-10-CM | POA: Diagnosis present

## 2015-02-09 LAB — BASIC METABOLIC PANEL
ANION GAP: 7 (ref 5–15)
BUN: 25 mg/dL — ABNORMAL HIGH (ref 6–20)
CHLORIDE: 102 mmol/L (ref 101–111)
CO2: 27 mmol/L (ref 22–32)
Calcium: 9.1 mg/dL (ref 8.9–10.3)
Creatinine, Ser: 1.85 mg/dL — ABNORMAL HIGH (ref 0.61–1.24)
GFR calc Af Amer: 37 mL/min — ABNORMAL LOW (ref >60)
GFR calc non Af Amer: 32 mL/min — ABNORMAL LOW (ref >60)
GLUCOSE: 95 mg/dL (ref 65–99)
POTASSIUM: 4 mmol/L (ref 3.5–5.1)
Sodium: 136 mmol/L (ref 135–145)

## 2015-02-09 LAB — CBC WITH DIFFERENTIAL/PLATELET
Basophils Absolute: 0 10*3/uL (ref 0.0–0.1)
Basophils Relative: 0 % (ref 0–1)
EOS PCT: 7 % — AB (ref 0–5)
Eosinophils Absolute: 0.5 10*3/uL (ref 0.0–0.7)
HCT: 41.6 % (ref 39.0–52.0)
Hemoglobin: 13.4 g/dL (ref 13.0–17.0)
LYMPHS ABS: 1.2 10*3/uL (ref 0.7–4.0)
Lymphocytes Relative: 17 % (ref 12–46)
MCH: 26.4 pg (ref 26.0–34.0)
MCHC: 32.2 g/dL (ref 30.0–36.0)
MCV: 81.9 fL (ref 78.0–100.0)
MONO ABS: 0.5 10*3/uL (ref 0.1–1.0)
Monocytes Relative: 8 % (ref 3–12)
Neutro Abs: 4.7 10*3/uL (ref 1.7–7.7)
Neutrophils Relative %: 68 % (ref 43–77)
PLATELETS: 232 10*3/uL (ref 150–400)
RBC: 5.08 MIL/uL (ref 4.22–5.81)
RDW: 14.7 % (ref 11.5–15.5)
WBC: 6.9 10*3/uL (ref 4.0–10.5)

## 2015-02-09 LAB — D-DIMER, QUANTITATIVE: D-Dimer, Quant: 3.3 ug/mL-FEU — ABNORMAL HIGH (ref 0.00–0.48)

## 2015-02-09 IMAGING — DX DG CHEST 2V
2 series · 2 of 2 positions shown · non-contrast
Comparison: CT chest [DATE]. Single view of the chest
[DATE].

CLINICAL DATA: Left ankle and foot swelling.

EXAM:
CHEST  2 VIEW

[w chest pa]
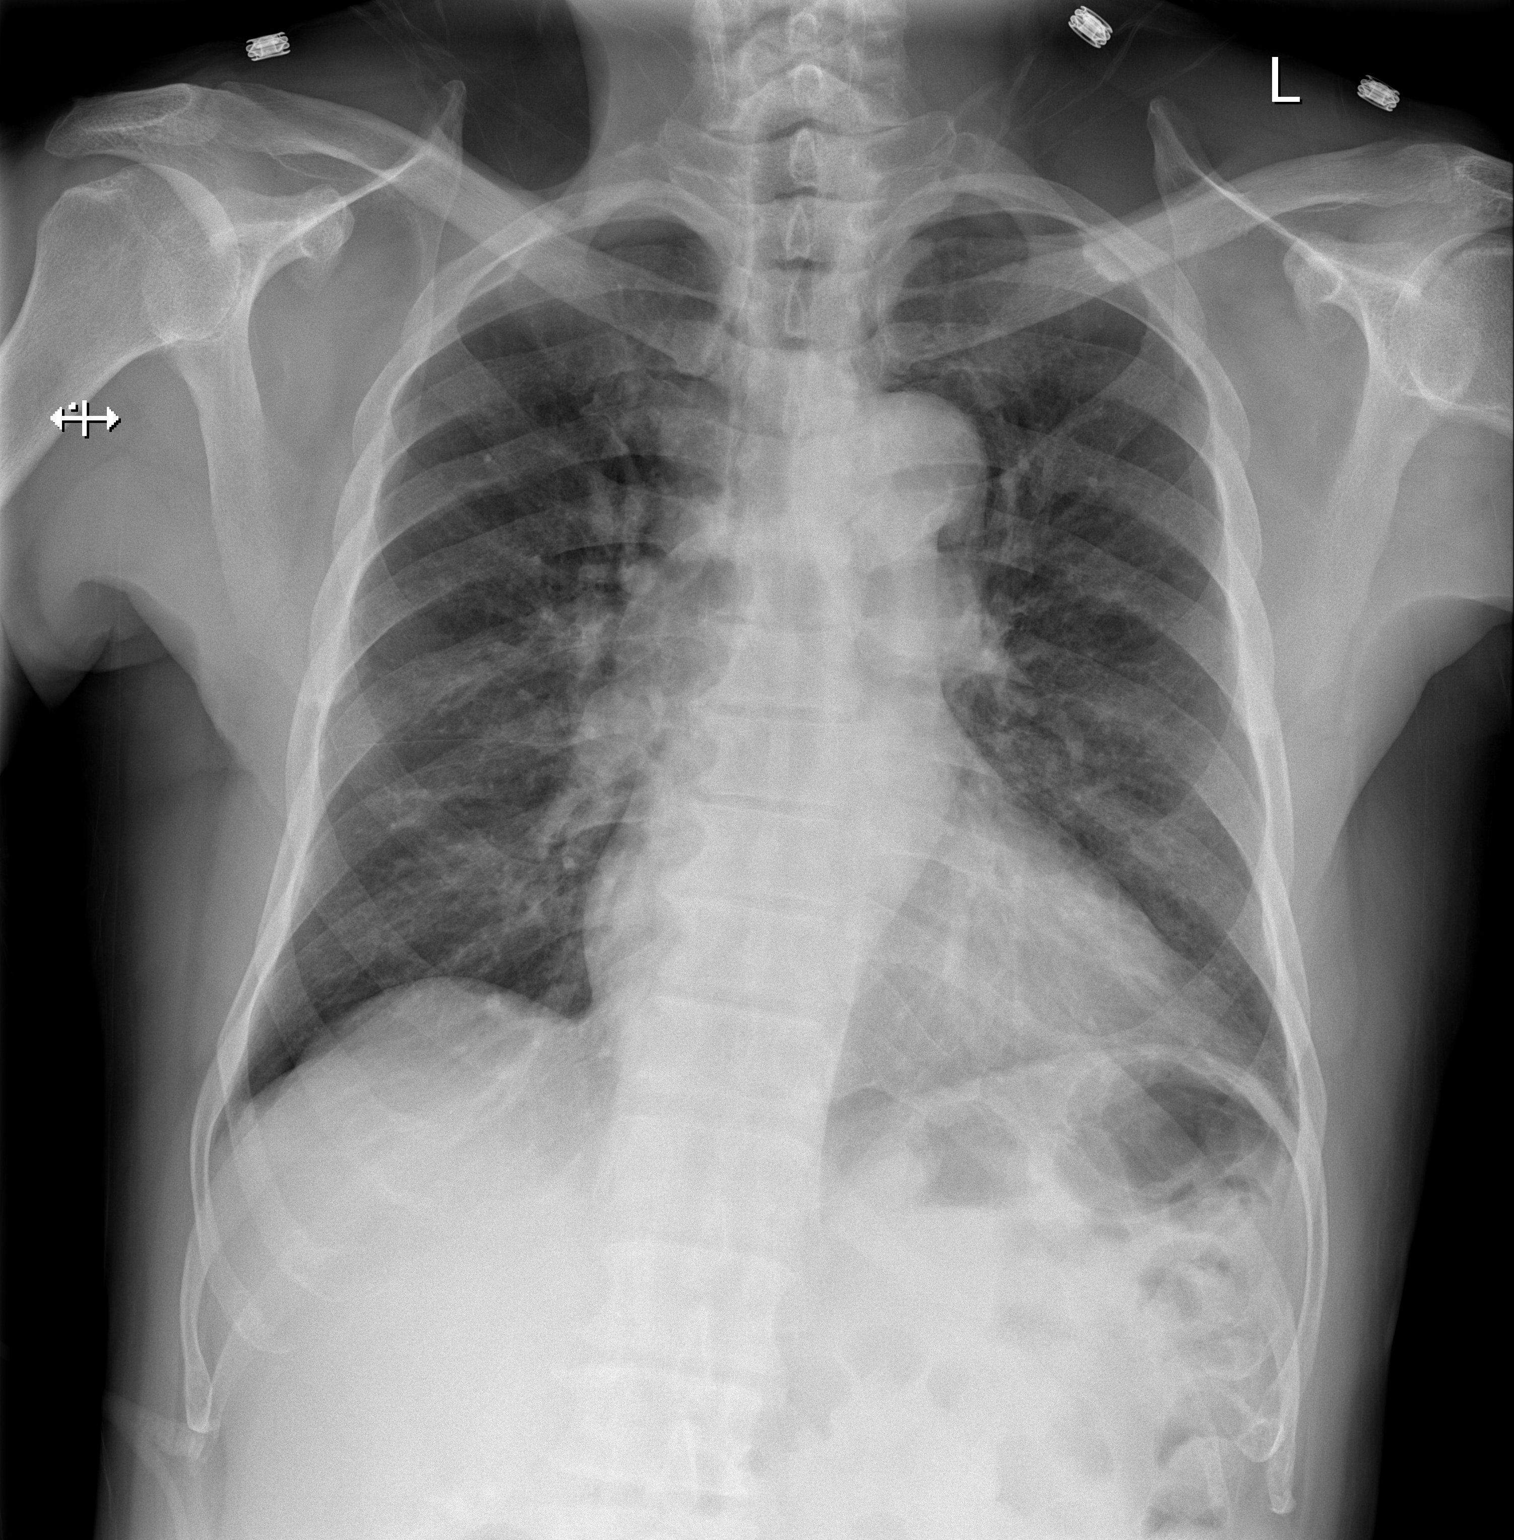

[w chest lat]
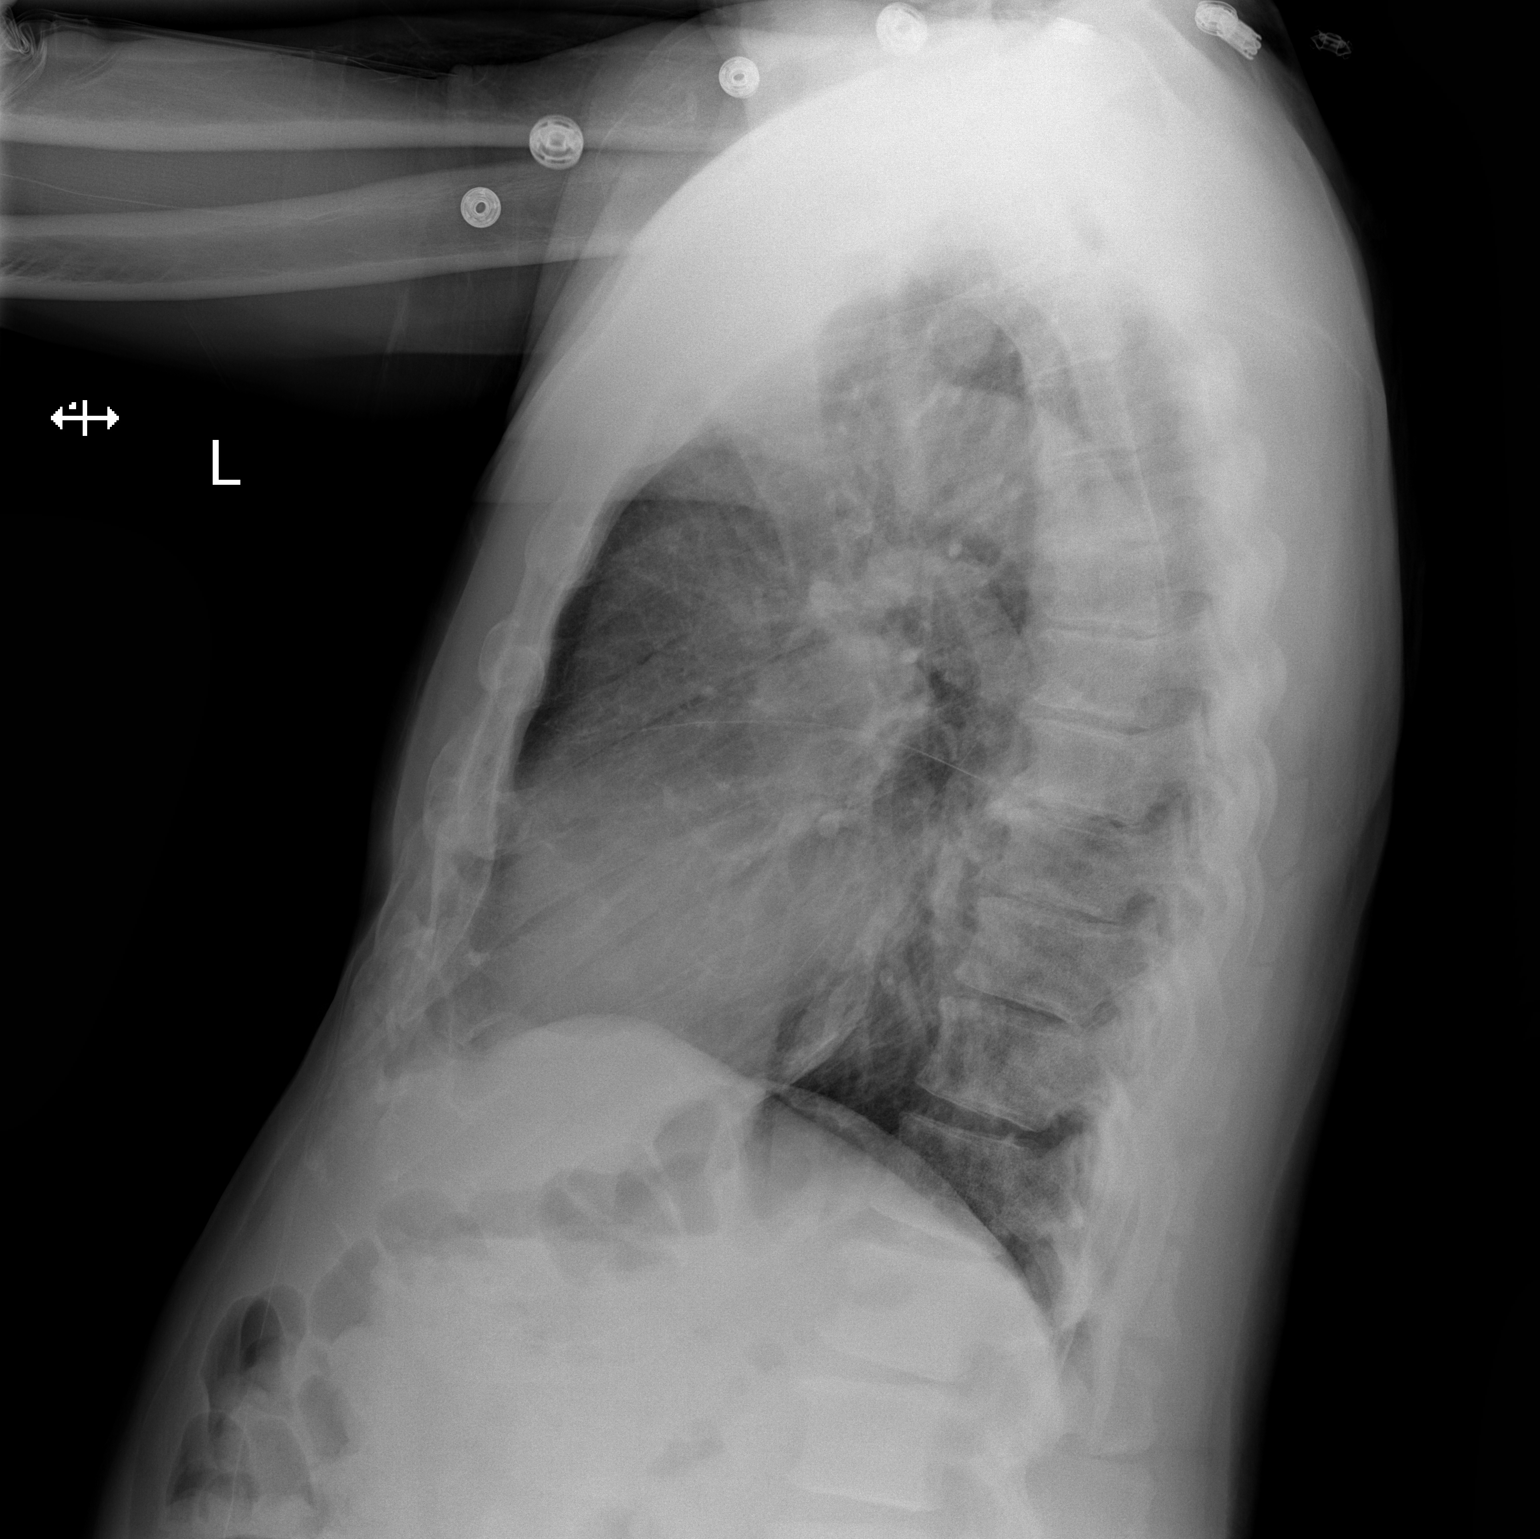

[2 of 2 positions shown; findings below may reference images not displayed]

FINDINGS: The lungs are clear. Heart size is upper normal. No pneumothorax or
pleural effusion. Thoracic spondylosis noted.
IMPRESSION: No acute disease.

## 2015-02-09 MED ORDER — LISINOPRIL-HYDROCHLOROTHIAZIDE 20-25 MG PO TABS: 1.0000 | ORAL_TABLET | Freq: Every day | ORAL | Status: DC

## 2015-02-09 MED ORDER — VANCOMYCIN HCL IN DEXTROSE 1-5 GM/200ML-% IV SOLN
1000.0000 mg | Freq: Once | INTRAVENOUS | Status: AC
  Administered 2015-02-09: 1000 mg via INTRAVENOUS
  Filled 2015-02-09: qty 200

## 2015-02-09 MED ORDER — VANCOMYCIN HCL IN DEXTROSE 1-5 GM/200ML-% IV SOLN
1000.0000 mg | INTRAVENOUS | Status: DC
  Administered 2015-02-10 – 2015-02-13 (×4): 1000 mg via INTRAVENOUS
  Filled 2015-02-09 (×5): qty 200

## 2015-02-09 MED ORDER — HEPARIN SODIUM (PORCINE) 5000 UNIT/ML IJ SOLN
5000.0000 [IU] | Freq: Three times a day (TID) | INTRAMUSCULAR | Status: DC
  Administered 2015-02-09 – 2015-02-14 (×15): 5000 [IU] via SUBCUTANEOUS
  Filled 2015-02-09 (×16): qty 1

## 2015-02-09 MED ORDER — SIMVASTATIN 20 MG PO TABS
20.0000 mg | ORAL_TABLET | Freq: Every day | ORAL | Status: DC
  Administered 2015-02-10 – 2015-02-14 (×5): 20 mg via ORAL
  Filled 2015-02-09 (×5): qty 1

## 2015-02-09 NOTE — H&P (Signed)
[facility] History and Physical  YANG RACK NIO:[id number] DOB: June 29, 1930 DOA: 02/09/2015  Referring physician: EDP PCP: [name], MD   Chief Complaint: Cellulitis   HPI: Isaac Phelps is a 81 y.o. male seen in the ED on 9/3 for cellulitis of his RLE.  Patient was discharged on clindamycin.  Since then he has had worsening of symptoms and rash despite taking clindamycin.  He returns to ED.  Review of Systems: Systems reviewed.  As above, otherwise negative  Past Medical History  Diagnosis Date  . BENIGN PROSTATIC HYPERTROPHY 04/01/2008  . HYPERLIPIDEMIA 05/26/2007  . HYPERTENSION 02/07/2007  . TOBACCO ABUSE 04/01/2008   Past Surgical History  Procedure Laterality Date  . Tonsillectomy    . Knee arthroscopy     Social History:  reports that he has been smoking Cigarettes.  He has been smoking about 0.30 packs per day. He has never used smokeless tobacco. He reports that he does not drink alcohol or use illicit drugs.  No Known Allergies  Family History  Problem Relation Age of Onset  . Arthritis Neg Hx     family  . Hypertension Neg Hx     family  . Stroke Neg Hx     family , 1st degree relative     Prior to Admission medications   Medication Sig Start Date End Date Taking? Authorizing Provider  lisinopril-hydrochlorothiazide (PRINZIDE,ZESTORETIC) 20-25 MG per tablet take 1 tablet by mouth once daily 06/07/14  Yes Marletta Lor, MD  simvastatin (ZOCOR) 40 MG tablet take 1/2 tablet by mouth once daily Patient not taking: Reported on 02/09/2015 06/07/14   Marletta Lor, MD   Physical Exam: Filed Vitals:   02/09/15 2115  BP: 162/71  Pulse: 59  Temp:   Resp:     BP 162/71 mmHg  Pulse 59  Temp(Src) 98.3 F (36.8 C) (Oral)  Resp 16  SpO2 99%  General Appearance:    Alert, oriented, no distress, appears stated age  Head:    Normocephalic, atraumatic  Eyes:    PERRL, EOMI, sclera non-icteric        Nose:   Nares without drainage or  epistaxis. Mucosa, turbinates normal  Throat:   Moist mucous membranes. Oropharynx without erythema or exudate.  Neck:   Supple. No carotid bruits.  No thyromegaly.  No lymphadenopathy.   Back:     No CVA tenderness, no spinal tenderness  Lungs:     Clear to auscultation bilaterally, without wheezes, rhonchi or rales  Chest wall:    No tenderness to palpitation  Heart:    Regular rate and rhythm without murmurs, gallops, rubs  Abdomen:     Soft, non-tender, nondistended, normal bowel sounds, no organomegaly  Genitalia:    deferred  Rectal:    deferred  Extremities:   No clubbing, cyanosis or edema.  Pulses:   2+ and symmetric all extremities  Skin:   Cellulitis with bullae on RLE ankle  Lymph nodes:   Cervical, supraclavicular, and axillary nodes normal  Neurologic:   CNII-XII intact. Normal strength, sensation and reflexes      throughout    Labs on Admission:  Basic Metabolic Panel:  Recent Labs Lab 02/09/15 2015  NA 136  K 4.0  CL 102  CO2 27  GLUCOSE 95  BUN 25*  CREATININE 1.85*  CALCIUM 9.1   Liver Function Tests: No results for input(s): AST, ALT, ALKPHOS, BILITOT, PROT, ALBUMIN in the last 168 hours. No results for input(s): LIPASE,  AMYLASE in the last 168 hours. No results for input(s): AMMONIA in the last 168 hours. CBC:  Recent Labs Lab 02/09/15 2015  WBC 6.9  NEUTROABS 4.7  HGB 13.4  HCT 41.6  MCV 81.9  PLT 232   Cardiac Enzymes: No results for input(s): CKTOTAL, CKMB, CKMBINDEX, TROPONINI in the last 168 hours.  BNP (last 3 results) No results for input(s): PROBNP in the last 8760 hours. CBG: No results for input(s): GLUCAP in the last 168 hours.  Radiological Exams on Admission: Dg Chest 2 View  02/09/2015   CLINICAL DATA:  Left ankle and foot swelling.  EXAM: CHEST  2 VIEW  COMPARISON:  CT chest 08/07/2011. Single view of the chest 12/06/2013.  FINDINGS: The lungs are clear. Heart size is upper normal. No pneumothorax or pleural effusion.  Thoracic spondylosis noted.  IMPRESSION: No acute disease.   Electronically Signed   By: Inge Rise M.D.   On: 02/09/2015 21:39    EKG: Independently reviewed.  Assessment/Plan Principal Problem:   Cellulitis of right ankle Active Problems:   Essential hypertension   CKD (chronic kidney disease) stage 3, GFR 30-59 ml/min   Cellulitis   1. Cellulitis of right ankle - failed outpatient treatment with clindamycin 1. Empiric vancomycin 2. Re-culture wound and ordering BCx 3. No signs or Sx of systemic involvement at this point 4. If this worsens while inpatient, then would broaden coverage with zosyn empirically 5. Will also get [place] to r/o DVT though this looks more like a cellulitis with bullae 2. HTN - continue home meds 3. CKD stage 3 - creatinine appears to be at baseline    Code Status: Full Code  Family Communication: Family at bedside Disposition Plan: Admit to inpatient   Time spent: 34 min  GARDNER, JARED M. [facility] Pager [phone]  If 7AM-7PM, please contact the day team taking care of the patient [email or web address] Password [facility] 02/09/2015, 10:21 PM

## 2015-02-09 NOTE — ED Provider Notes (Signed)
CSN: [id number]     Arrival date & time 02/09/15  1554 History   First MD Initiated Contact with Patient 02/09/15 1900     Chief Complaint  Patient presents with  . Leg Swelling     (Consider location/radiation/quality/duration/timing/severity/associated sxs/prior Treatment) HPI Comments: Pt comes in with worsening redness and swelling to his right lower extremity. He states that he was seen and put on clindamycin 5 days ago. He states that he has been taking his medication as directed. He state that he was sent down here from urgent care to be evaluated. He has no history of clots. He states that he has blisters to his ankles that are draining fluid  The history is provided by the patient. No language interpreter was used.    Past Medical History  Diagnosis Date  . BENIGN PROSTATIC HYPERTROPHY 04/01/2008  . HYPERLIPIDEMIA 05/26/2007  . HYPERTENSION 02/07/2007  . TOBACCO ABUSE 04/01/2008   Past Surgical History  Procedure Laterality Date  . Tonsillectomy    . Knee arthroscopy     Family History  Problem Relation Age of Onset  . Arthritis Neg Hx     family  . Hypertension Neg Hx     family  . Stroke Neg Hx     family , 1st degree relative   Social History  Substance Use Topics  . Smoking status: Current Every Day Smoker -- 0.30 packs/day    Types: Cigarettes  . Smokeless tobacco: Never Used  . Alcohol Use: No    Review of Systems  All other systems reviewed and are negative.     Allergies  Review of patient's allergies indicates no known allergies.  Home Medications   Prior to Admission medications   Medication Sig Start Date End Date Taking? Authorizing Provider  clindamycin (CLEOCIN) 300 MG capsule Take 1 capsule (300 mg total) by mouth 3 (three) times daily. 02/05/15   Waldemar Dickens, MD  clobetasol ointment (TEMOVATE) 0.86 % Apply 1 application topically daily as needed.  05/21/14   Historical Provider, MD  hydrochlorothiazide (HYDRODIURIL) 25 MG tablet Take  25 mg by mouth daily.    Historical Provider, MD  lisinopril (PRINIVIL,ZESTRIL) 20 MG tablet Take 20 mg by mouth daily.    Historical Provider, MD  lisinopril-hydrochlorothiazide Reita May) 20-25 MG per tablet take 1 tablet by mouth once daily 06/07/14   Marletta Lor, MD  simvastatin (ZOCOR) 40 MG tablet take 1/2 tablet by mouth once daily 06/07/14   Marletta Lor, MD   BP 146/74 mmHg  Pulse 63  Temp(Src) 98.3 F (36.8 C) (Oral)  Resp 16  SpO2 95% Physical Exam  Constitutional: He is oriented to person, place, and time. He appears well-developed and well-nourished.  Cardiovascular: Normal rate and regular rhythm.   Pulmonary/Chest: Effort normal and breath sounds normal.  Musculoskeletal: Normal range of motion. He exhibits edema.  Pt has swelling to the right lower extremity up to the knee. He has multiple large bullae to the around the ankle that a weeping. No definite pus noted. Pulses noted  Neurological: He is alert and oriented to person, place, and time. Coordination normal.  Nursing note and vitals reviewed.   ED Course  Procedures (including critical care time) Labs Review Labs Reviewed  BASIC METABOLIC PANEL - Abnormal; Notable for the following:    BUN 25 (*)    Creatinine, Ser 1.85 (*)    GFR calc non Af Amer 32 (*)    GFR calc Af Amer 37 (*)  All other components within normal limits  CBC WITH DIFFERENTIAL/PLATELET - Abnormal; Notable for the following:    Eosinophils Relative 7 (*)    All other components within normal limits  D-DIMER, QUANTITATIVE (NOT AT [facility]) - Abnormal; Notable for the following:    D-Dimer, Quant 3.30 (*)    All other components within normal limits    Imaging Review Dg Chest 2 View  02/09/2015   CLINICAL DATA:  Left ankle and foot swelling.  EXAM: CHEST  2 VIEW  COMPARISON:  CT chest 08/07/2011. Single view of the chest 12/06/2013.  FINDINGS: The lungs are clear. Heart size is upper normal. No pneumothorax or pleural  effusion. Thoracic spondylosis noted.  IMPRESSION: No acute disease.   Electronically Signed   By: Inge Rise M.D.   On: 02/09/2015 21:39   I have personally reviewed and evaluated these images and lab results as part of my medical decision-making.   EKG Interpretation None      MDM   Final diagnoses:  Renal insufficiency  Cellulitis of right lower extremity    Pt to be admitted for failed therapy. Pt started on vancomycin.will admit to the hospital. Pt has not been ruled out for dvt.although are does seem infectious     Glendell Docker, NP 02/09/15 2212  Merrily Pew, MD 02/10/15 [phone]

## 2015-02-09 NOTE — ED Notes (Signed)
Pt here for cellulitis to RLE. Dx on the 3rd and started on abx, sts not getting any better. Pt has blisters and redness up right leg.

## 2015-02-09 NOTE — ED Notes (Signed)
Pt transferring to ED via shuttle for possible DVT in the right leg.  Report was called to Janett Billow, ED First RN.  Pt walked to lobby independently to wait for the shuttle.

## 2015-02-09 NOTE — Consult Note (Signed)
[place] for :   Vancomycin Indication:  R-ankle cellulitis following failed Clindamycin therapy  Hospital Problems: Principal Problem:   Cellulitis of right ankle Active Problems:   Essential hypertension   CKD (chronic kidney disease) stage 3, GFR 30-59 ml/min   Cellulitis  Allergies: No Known Allergies  Patient Measurements: Height: 5' 10.47" (179 cm) Weight: 183 lb (83.008 kg) (patient estimate) IBW/kg (Calculated) : 74.09  Vancomycin Dosing Weight:  83 kg    Vital Signs: Temp: 98.3 F (36.8 C) (09/07 1637) Temp Source: Oral (09/07 1637) BP: 162/71 mmHg (09/07 2115) Pulse Rate: 56 (09/07 2115)  Labs:  Recent Labs  02/09/15 2015  WBC 6.9  HGB 13.4  PLT 232  CREATININE 1.85*   Estimated Creatinine Clearance: 32.3 mL/min (by C-G formula based on Cr of 1.85).  Microbiology: Recent Results (from the past 720 hour(s))  Wound culture     Status: None   Collection Time: 02/05/15  2:36 PM  Result Value Ref Range Status   Specimen Description WOUND  Final   Special Requests RIGHT LOWER LEG  Final   Gram Stain   Final    NO WBC SEEN NO SQUAMOUS EPITHELIAL CELLS SEEN NO ORGANISMS SEEN Performed at [facility]    Culture   Final    NO GROWTH 2 DAYS Performed at [facility]    Report Status 02/08/2015 FINAL  Final    Medical/Surgical History: Past Medical History  Diagnosis Date  . BENIGN PROSTATIC HYPERTROPHY 04/01/2008  . HYPERLIPIDEMIA 05/26/2007  . HYPERTENSION 02/07/2007  . TOBACCO ABUSE 04/01/2008   Past Surgical History  Procedure Laterality Date  . Tonsillectomy    . Knee arthroscopy      Current Medication[s] Include: Prior to Admission: Clindamycin x 5 days  Other active meds pending Scheduled:  Scheduled:  . heparin  5,000 Units Subcutaneous 3 times per day  . [START ON 02/10/2015] lisinopril-hydrochlorothiazide  1 tablet Oral Daily  . [START ON 02/10/2015] simvastatin  20 mg Oral Daily    Infusion[s]: Infusions:   Antibiotic[s]: Anti-infectives    Start     Dose/Rate Route Frequency Ordered Stop   02/09/15 2100  vancomycin (VANCOCIN) IVPB 1000 mg/200 mL premix     1,000 mg 200 mL/hr over 60 Minutes Intravenous  Once 02/09/15 2048 02/09/15 2234      Assessment: 81 y.o. male seen 9/3 for cellulitis of his RLE who was discharged on clindamycin. He has had worsening of symptoms and rash despite taking clindamycin.  Vancomycin ordered for cellulitis coverage per Pharmacy consult. CrCl ~ 32 ml/min.  WBC 6.9, currently afebrile.  Goal of Therapy:   Vancomycin trough level 10-15 mcg/ml  Plan:  1. Patient has received Vancomycin 1 gm IV x 1. 2. Continue Vancomycin 1 gm IV q 24 hours. 3. Monitor renal function, WBC, fever curve, follow up cultures/sensitivities, Vancomycin levels as clinically indicated, length of therapy, and follow clinical progression.  Micaiah Litle, Zettie Pho,  Pharm.D,    9/7/201610:43 PM

## 2015-02-09 NOTE — ED Provider Notes (Signed)
CSN: [id number]     Arrival date & time 02/09/15  1321 History   First MD Initiated Contact with Patient 02/09/15 1419     Chief Complaint  Patient presents with  . Follow-up   (Consider location/radiation/quality/duration/timing/severity/associated sxs/prior Treatment) HPI He is an 81 year old man here for follow-up of right leg swelling. He was seen here on Saturday for swelling and redness of the right leg. At that time he was diagnosed with a cellulitis and placed on clindamycin. He states he started the clindamycin Saturday night and has been taking it as prescribed. He states the leg is not any better. He doesn't think it is worse, but states it is definitely not improving. He reports swelling in the foot, ankle, and calf. He has never had swelling like this before. There are also blisters around the ankle, one of which has unroofed. He reports discomfort in the right leg from the swelling.  Past Medical History  Diagnosis Date  . BENIGN PROSTATIC HYPERTROPHY 04/01/2008  . HYPERLIPIDEMIA 05/26/2007  . HYPERTENSION 02/07/2007  . TOBACCO ABUSE 04/01/2008   Past Surgical History  Procedure Laterality Date  . Tonsillectomy    . Knee arthroscopy     Family History  Problem Relation Age of Onset  . Arthritis Neg Hx     family  . Hypertension Neg Hx     family  . Stroke Neg Hx     family , 1st degree relative   Social History  Substance Use Topics  . Smoking status: Current Every Day Smoker -- 0.30 packs/day    Types: Cigarettes  . Smokeless tobacco: Never Used  . Alcohol Use: No    Review of Systems As in history of present illness Allergies  Review of patient's allergies indicates no known allergies.  Home Medications   Prior to Admission medications   Medication Sig Start Date End Date Taking? Authorizing Provider  clindamycin (CLEOCIN) 300 MG capsule Take 1 capsule (300 mg total) by mouth 3 (three) times daily. 02/05/15  Yes Waldemar Dickens, MD  clobetasol ointment  (TEMOVATE) 4.00 % Apply 1 application topically daily as needed.  05/21/14  Yes Historical Provider, MD  hydrochlorothiazide (HYDRODIURIL) 25 MG tablet Take 25 mg by mouth daily.   Yes Historical Provider, MD  lisinopril (PRINIVIL,ZESTRIL) 20 MG tablet Take 20 mg by mouth daily.   Yes Historical Provider, MD  lisinopril-hydrochlorothiazide (PRINZIDE,ZESTORETIC) 20-25 MG per tablet take 1 tablet by mouth once daily 06/07/14  Yes Marletta Lor, MD  simvastatin (ZOCOR) 40 MG tablet take 1/2 tablet by mouth once daily 06/07/14  Yes Marletta Lor, MD   Meds Ordered and Administered this Visit  Medications - No data to display  BP 175/82 mmHg  Pulse 68  Temp(Src) 98.7 F (37.1 C) (Oral)  Resp 24  SpO2 100% No data found.   Physical Exam  Constitutional: He is oriented to person, place, and time. He appears well-developed and well-nourished. No distress.  Cardiovascular: Normal rate.   Pulmonary/Chest: Effort normal.  Musculoskeletal:  Right leg: There is 2+ pitting edema to the knee. There is also redness and warmth from the ankle to the mid shin. He has several large bullae around the ankle. There is also some weeping. No frank pus. 1+ DP pulse.  Left leg: No edema.   Neurological: He is alert and oriented to person, place, and time.    ED Course  Procedures (including critical care time)  Labs Review Labs Reviewed - No data to display  Imaging Review No results found.    MDM   1. Right leg swelling   2. Cellulitis of right lower extremity    Concern for failed outpatient treatment of cellulitis versus DVT. We'll transfer to [facility] ER via shuttle for additional evaluation with ultrasound and possible IV antibiotics.    Melony Overly, MD 02/09/15 [phone]

## 2015-02-09 NOTE — ED Notes (Signed)
Pt is here to follow up from his visit here on Saturday.  Pt feels like his leg is not getting better.  He is taking his medications as prescribed and has been keeping the leg wrapped.  Pt has not contacted his primary care doctor, yet.

## 2015-02-10 ENCOUNTER — Inpatient Hospital Stay (HOSPITAL_COMMUNITY): Payer: Medicare Other

## 2015-02-10 ENCOUNTER — Encounter (HOSPITAL_COMMUNITY): Payer: Self-pay | Admitting: *Deleted

## 2015-02-10 DIAGNOSIS — I82409 Acute embolism and thrombosis of unspecified deep veins of unspecified lower extremity: Secondary | ICD-10-CM

## 2015-02-10 DIAGNOSIS — L03115 Cellulitis of right lower limb: Principal | ICD-10-CM

## 2015-02-10 DIAGNOSIS — N183 Chronic kidney disease, stage 3 (moderate): Secondary | ICD-10-CM

## 2015-02-10 DIAGNOSIS — I10 Essential (primary) hypertension: Secondary | ICD-10-CM

## 2015-02-10 DIAGNOSIS — E785 Hyperlipidemia, unspecified: Secondary | ICD-10-CM

## 2015-02-10 MED ORDER — LISINOPRIL 20 MG PO TABS
20.0000 mg | ORAL_TABLET | Freq: Every day | ORAL | Status: DC
  Administered 2015-02-10 – 2015-02-11 (×2): 20 mg via ORAL
  Filled 2015-02-10 (×2): qty 1

## 2015-02-10 MED ORDER — HYDROCHLOROTHIAZIDE 25 MG PO TABS
25.0000 mg | ORAL_TABLET | Freq: Every day | ORAL | Status: DC
  Administered 2015-02-10 – 2015-02-11 (×2): 25 mg via ORAL
  Filled 2015-02-10 (×2): qty 1

## 2015-02-10 NOTE — Progress Notes (Signed)
Pt. Admitted to the unit at [facility] . Pt. Mental status is alert and oriented x4. Pt oriented to room, staff, and call bell. Pt. Able to walk self to bathroom.  Right lower extremity has weeping blisters and edema. Left lower extremity has smaller blisters that are not weeping. Call bell within reach. Visitor guidelines reviewed with patient and/or family.

## 2015-02-10 NOTE — Progress Notes (Signed)
VASCULAR LAB PRELIMINARY  PRELIMINARY  PRELIMINARY  PRELIMINARY  Right lower extremity venous duplex completed.    Preliminary report:  Right:  No evidence of DVT, superficial thrombosis, or Baker's cyst.  Elaina Cara, RVS 02/10/2015, 10:19 AM

## 2015-02-10 NOTE — Care Management Note (Signed)
Case Management Note  Patient Details  Name: Isaac Phelps MRN: [id number] Date of Birth: June 30, 1930  Subjective/Objective:                 PAtient from home with wife. Admitted with cellulitis of ankle. Patient states he has a walker and several canes at home. Denies any barriers to care.    Action/Plan:  Will continue to follow and offer resources and [facility] as needed.  Expected Discharge Date:                  Expected Discharge Plan:  Home/Self Care  In-House Referral:     Discharge planning Services  CM Consult  Post Acute Care Choice:    Choice offered to:     DME Arranged:    DME Agency:     HH Arranged:    HH Agency:     Status of Service:  In process, will continue to follow  Medicare Important Message Given:    Date Medicare IM Given:    Medicare IM give by:    Date Additional Medicare IM Given:    Additional Medicare Important Message give by:     If discussed at [place] of Stay Meetings, dates discussed:    Additional Comments:  Carles Collet, RN 02/10/2015, 2:33 PM

## 2015-02-10 NOTE — Consult Note (Signed)
WOC wound consult note Reason for Consult: Consult requested for RLE cellulitis.  Left leg does not have swelling or wounds. Wound type: RLE with generalized edema and scattered clear fluid-filled blisters to approx 12X12cm affected area near ankle.  One has ruptured and evolved into a partial thickness wound, .3X.3X.1cm, red and moist. Drainage (amount, consistency, odor) Small amt yellow drainage, no odor.  Dressing procedure/placement/frequency: Foam dressing to absorb drainage and protect from further injury.  Ace wrap to provide light compression. Please re-consult if further assistance is needed.  Thank-you,  Julien Girt MSN, [place], [place], [place], [place]

## 2015-02-10 NOTE — Progress Notes (Signed)
[facility]                                                                              Patient Demographics  Isaac Phelps, is a 81 y.o. male, DOB - [birth year], EPP:[id number]  Admit date - 02/09/2015   Admitting Physician Etta Quill, DO  Outpatient Primary MD for the patient is [name], MD  LOS - 1   Chief Complaint  Patient presents with  . Leg Swelling      HPI on 02/09/2015 by Dr. Jennette Kettle Isaac Phelps is a 81 y.o. male seen in the ED on 9/3 for cellulitis of his RLE. Patient was discharged on clindamycin. Since then he has had worsening of symptoms and rash despite taking clindamycin. He returns to ED.  Assessment & Plan   RLE cellulitis -Patient failed outpatient treatment with clindamycin. -Currently afebrile, no leukocytosis -RLE doppler: No evidence of DVT, superficial thrombosis, or Baker's cyst -Continue vancomycin -Wound care consulted and appreciated  Hypertension -Continue lisinopril, HCTZ  Chronic Kidney disease, stage III -Baseline creatinine approximately 1.8, currently 1.85 -Continue to monitor BMP  Hyperlipidemia -Continue statin  Code Status: Full  Family Communication: Friend at bedside  Disposition Plan: Admitted  Time Spent in minutes   30 minutes  Procedures  RLE doppler  Consults   Wound care  DVT Prophylaxis  Heparin  Lab Results  Component Value Date   PLT 232 02/09/2015    Medications  Scheduled Meds: . heparin  5,000 Units Subcutaneous 3 times per day  . lisinopril  20 mg Oral Daily   And  . hydrochlorothiazide  25 mg Oral Daily  . simvastatin  20 mg Oral q1800  . vancomycin  1,000 mg Intravenous Q24H   Continuous Infusions:  PRN Meds:.  Antibiotics    Anti-infectives    Start     Dose/Rate Route Frequency Ordered Stop   02/10/15 2100  vancomycin (VANCOCIN) IVPB 1000 mg/200 mL premix     1,000 mg 200 mL/hr over 60 Minutes Intravenous Every 24 hours 02/09/15 2250     02/09/15  2100  vancomycin (VANCOCIN) IVPB 1000 mg/200 mL premix     1,000 mg 200 mL/hr over 60 Minutes Intravenous  Once 02/09/15 2048 02/09/15 2234        Subjective:   [place] seen and examined today.  Patient feels that his right lower extremity pain has improved, states the tightness has also improved. Denies any chest pain, shortness of breath, abdominal pain, dizziness or headache.  Objective:   Filed Vitals:   02/09/15 2300 02/10/15 0003 02/10/15 0629 02/10/15 1143  BP: 146/85 161/70 148/71 117/53  Pulse: 70 60 65   Temp:  98 F (36.7 C) 98.3 F (36.8 C)   TempSrc:  Oral Oral   Resp: 16 18 16    Height:  5' 1.32" (1.558 m)    Weight:  80.468 kg (177 lb 6.4 oz)    SpO2: 99% 100% 97%     Wt Readings from Last 3 Encounters:  02/10/15 80.468 kg (177 lb 6.4 oz)  05/25/14 83.915 kg (185 lb)  05/25/13 81.647 kg (180 lb)     Intake/Output Summary (Last 24 hours)  at 02/10/15 1212 Last data filed at 02/10/15 1055  Gross per 24 hour  Intake    240 ml  Output      0 ml  Net    240 ml    Exam  General: Well developed, well nourished, NAD, appears stated age  86: NCAT, mucous membranes moist.   Cardiovascular: S1 S2 auscultated, no rubs, murmurs or gallops. Regular rate and rhythm.  Respiratory: Clear to auscultation bilaterally with equal chest rise  Abdomen: Soft, nontender, nondistended, + bowel sounds  Extremities: warm dry without cyanosis clubbing.  RLE -erythema, mild edema. Open blisters.  Neuro: AAOx3, nonfocal  Psych: Normal affect and demeanor   Data Review   Micro Results Recent Results (from the past 240 hour(s))  Wound culture     Status: None   Collection Time: 02/05/15  2:36 PM  Result Value Ref Range Status   Specimen Description WOUND  Final   Special Requests RIGHT LOWER LEG  Final   Gram Stain   Final    NO WBC SEEN NO SQUAMOUS EPITHELIAL CELLS SEEN NO ORGANISMS SEEN Performed at [facility]    Culture   Final    NO  GROWTH 2 DAYS Performed at [facility]    Report Status 02/08/2015 FINAL  Final    Radiology Reports Dg Chest 2 View  02/09/2015   CLINICAL DATA:  Left ankle and foot swelling.  EXAM: CHEST  2 VIEW  COMPARISON:  CT chest 08/07/2011. Single view of the chest 12/06/2013.  FINDINGS: The lungs are clear. Heart size is upper normal. No pneumothorax or pleural effusion. Thoracic spondylosis noted.  IMPRESSION: No acute disease.   Electronically Signed   By: Inge Rise M.D.   On: 02/09/2015 21:39    CBC  Recent Labs Lab 02/09/15 2015  WBC 6.9  HGB 13.4  HCT 41.6  PLT 232  MCV 81.9  MCH 26.4  MCHC 32.2  RDW 14.7  LYMPHSABS 1.2  MONOABS 0.5  EOSABS 0.5  BASOSABS 0.0    Chemistries   Recent Labs Lab 02/09/15 2015  NA 136  K 4.0  CL 102  CO2 27  GLUCOSE 95  BUN 25*  CREATININE 1.85*  CALCIUM 9.1   ------------------------------------------------------------------------------------------------------------------ estimated creatinine clearance is 27.9 mL/min (by C-G formula based on Cr of 1.85). ------------------------------------------------------------------------------------------------------------------ No results for input(s): HGBA1C in the last 72 hours. ------------------------------------------------------------------------------------------------------------------ No results for input(s): CHOL, HDL, LDLCALC, TRIG, CHOLHDL, LDLDIRECT in the last 72 hours. ------------------------------------------------------------------------------------------------------------------ No results for input(s): TSH, T4TOTAL, T3FREE, THYROIDAB in the last 72 hours.  Invalid input(s): FREET3 ------------------------------------------------------------------------------------------------------------------ No results for input(s): VITAMINB12, FOLATE, FERRITIN, TIBC, IRON, RETICCTPCT in the last 72 hours.  Coagulation profile No results for input(s): INR, PROTIME in the  last 168 hours.   Recent Labs  02/09/15 2015  DDIMER 3.30*    Cardiac Enzymes No results for input(s): CKMB, TROPONINI, MYOGLOBIN in the last 168 hours.  Invalid input(s): CK ------------------------------------------------------------------------------------------------------------------ Invalid input(s): POCBNP    Taher Vannote D.O. on 02/10/2015 at 12:12 PM  Between 7am to 7pm - Pager - [phone]  After 7pm go to [email or web address] - password [email or web address]  And look for the night coverage person covering for me after hours  [facility] Office  [phone]

## 2015-02-11 LAB — CBC
HEMATOCRIT: 35.3 % — AB (ref 39.0–52.0)
Hemoglobin: 11.2 g/dL — ABNORMAL LOW (ref 13.0–17.0)
MCH: 26 pg (ref 26.0–34.0)
MCHC: 31.7 g/dL (ref 30.0–36.0)
MCV: 81.9 fL (ref 78.0–100.0)
Platelets: 218 10*3/uL (ref 150–400)
RBC: 4.31 MIL/uL (ref 4.22–5.81)
RDW: 14.7 % (ref 11.5–15.5)
WBC: 6.4 10*3/uL (ref 4.0–10.5)

## 2015-02-11 LAB — BASIC METABOLIC PANEL
Anion gap: 8 (ref 5–15)
Anion gap: 5 (ref 5–15)
BUN: 27 mg/dL — AB (ref 6–20)
BUN: 27 mg/dL — ABNORMAL HIGH (ref 6–20)
CALCIUM: 8.7 mg/dL — AB (ref 8.9–10.3)
CALCIUM: 8.5 mg/dL — AB (ref 8.9–10.3)
CHLORIDE: 105 mmol/L (ref 101–111)
CHLORIDE: 105 mmol/L (ref 101–111)
CO2: 23 mmol/L (ref 22–32)
CO2: 24 mmol/L (ref 22–32)
CREATININE: 1.91 mg/dL — AB (ref 0.61–1.24)
CREATININE: 1.71 mg/dL — AB (ref 0.61–1.24)
GFR calc Af Amer: 36 mL/min — ABNORMAL LOW (ref >60)
GFR calc Af Amer: 41 mL/min — ABNORMAL LOW (ref >60)
GFR calc non Af Amer: 31 mL/min — ABNORMAL LOW (ref >60)
GFR calc non Af Amer: 36 mL/min — ABNORMAL LOW (ref >60)
GLUCOSE: 118 mg/dL — AB (ref 65–99)
GLUCOSE: 108 mg/dL — AB (ref 65–99)
Potassium: 4.1 mmol/L (ref 3.5–5.1)
Potassium: 4 mmol/L (ref 3.5–5.1)
Sodium: 136 mmol/L (ref 135–145)
Sodium: 134 mmol/L — ABNORMAL LOW (ref 135–145)

## 2015-02-11 MED ORDER — HYDRALAZINE HCL 20 MG/ML IJ SOLN: 10.0000 mg | Freq: Four times a day (QID) | INTRAMUSCULAR | Status: DC | PRN

## 2015-02-11 NOTE — Care Management Important Message (Signed)
Important Message  Patient Details  Name: OZAN MACLAY MRN: [id number] Date of Birth: Oct 24, 1930   Medicare Important Message Given:  Yes-third notification given    Nathen May 02/11/2015, 11:55 AMImportant Message  Patient Details  Name: TIQUAN BOUCH MRN: [id number] Date of Birth: Dec 08, 1930   Medicare Important Message Given:  Yes-third notification given    Nathen May 02/11/2015, 11:55 AM

## 2015-02-11 NOTE — Progress Notes (Signed)
[facility]                                                                              Patient Demographics  Isaac Phelps, is a 81 y.o. male, DOB - 01-02-1931, VFI:[id number]  Admit date - 02/09/2015   Admitting Physician Etta Quill, DO  Outpatient Primary MD for the patient is [name], MD  LOS - 2   Chief Complaint  Patient presents with  . Leg Swelling      HPI on 02/09/2015 by Dr. Jennette Kettle RICHAR Phelps is a 81 y.o. male seen in the ED on 9/3 for cellulitis of his RLE. Patient was discharged on clindamycin. Since then he has had worsening of symptoms and rash despite taking clindamycin. He returns to ED.  Assessment & Plan   RLE cellulitis -Patient failed outpatient treatment with clindamycin. -Currently afebrile, no leukocytosis -Pain is improving, however continues to have erythema and edema -RLE doppler: No evidence of DVT, superficial thrombosis, or Baker's cyst -Continue vancomycin -Wound care consulted and appreciated  Hypertension -Hold lisinopril, HCTZ -will place on hydralazine PRN  Chronic Kidney disease, stage III -Baseline creatinine approximately 1.8, currently 1.91 -will hold HTN meds  -Continue to monitor BMP  Hyperlipidemia -Continue statin  Code Status: Full  Family Communication: None at bedside  Disposition Plan: Admitted.  Pending improvement in cellulitis.  Time Spent in minutes   30 minutes  Procedures  RLE doppler  Consults   Wound care  DVT Prophylaxis  Heparin  Lab Results  Component Value Date   PLT 218 02/11/2015    Medications  Scheduled Meds: . heparin  5,000 Units Subcutaneous 3 times per day  . lisinopril  20 mg Oral Daily   And  . hydrochlorothiazide  25 mg Oral Daily  . simvastatin  20 mg Oral q1800  . vancomycin  1,000 mg Intravenous Q24H   Continuous Infusions:  PRN Meds:.  Antibiotics    Anti-infectives    Start     Dose/Rate Route Frequency Ordered Stop   02/10/15  2100  vancomycin (VANCOCIN) IVPB 1000 mg/200 mL premix     1,000 mg 200 mL/hr over 60 Minutes Intravenous Every 24 hours 02/09/15 2250     02/09/15 2100  vancomycin (VANCOCIN) IVPB 1000 mg/200 mL premix     1,000 mg 200 mL/hr over 60 Minutes Intravenous  Once 02/09/15 2048 02/09/15 2234        Subjective:   [place] seen and examined today.  Patient denies pain in RLE.  Feels the swelling has improved in his calf, but not foot. Denies any chest pain, shortness of breath, abdominal pain, dizziness or headache.  Objective:   Filed Vitals:   02/10/15 1143 02/10/15 1339 02/10/15 2221 02/11/15 0532  BP: 117/53 130/63 145/66 135/57  Pulse:  64 72 65  Temp:  98.1 F (36.7 C) 98.5 F (36.9 C) 98.7 F (37.1 C)  TempSrc:  Oral Oral Oral  Resp:  20 18 18   Height:      Weight:      SpO2:  99% 100% 99%    Wt Readings from Last 3 Encounters:  02/10/15 80.468 kg (177 lb 6.4 oz)  05/25/14 83.915 kg (185 lb)  05/25/13 81.647 kg (180 lb)     Intake/Output Summary (Last 24 hours) at 02/11/15 1036 Last data filed at 02/11/15 0707  Gross per 24 hour  Intake    480 ml  Output   1025 ml  Net   -545 ml    Exam  General: Well developed, well nourished, NAD  HEENT: NCAT, mucous membranes moist.   Cardiovascular: S1 S2 auscultated, no murmurs, RRR  Respiratory: Clear to auscultation  Abdomen: Soft, nontender, nondistended, + bowel sounds  Extremities: warm dry without cyanosis clubbing.  RLE -erythema, mild edema. Dressings in place.  Neuro: AAOx3, nonfocal  Psych: Normal affect and demeanor, pleasant    Data Review   Micro Results Recent Results (from the past 240 hour(s))  Wound culture     Status: None   Collection Time: 02/05/15  2:36 PM  Result Value Ref Range Status   Specimen Description WOUND  Final   Special Requests RIGHT LOWER LEG  Final   Gram Stain   Final    NO WBC SEEN NO SQUAMOUS EPITHELIAL CELLS SEEN NO ORGANISMS SEEN Performed at [facility]    Culture   Final    NO GROWTH 2 DAYS Performed at [facility]    Report Status 02/08/2015 FINAL  Final  Culture, blood (routine x 2)     Status: None (Preliminary result)   Collection Time: 02/09/15 10:29 PM  Result Value Ref Range Status   Specimen Description BLOOD LEFT ANTECUBITAL  Final   Special Requests BOTTLES DRAWN AEROBIC AND ANAEROBIC 5CC  Final   Culture NO GROWTH < 24 HOURS  Final   Report Status PENDING  Incomplete  Culture, blood (routine x 2)     Status: None (Preliminary result)   Collection Time: 02/09/15 10:38 PM  Result Value Ref Range Status   Specimen Description BLOOD LEFT HAND  Final   Special Requests BOTTLES DRAWN AEROBIC AND ANAEROBIC 5CC  Final   Culture NO GROWTH < 24 HOURS  Final   Report Status PENDING  Incomplete    Radiology Reports Dg Chest 2 View  02/09/2015   CLINICAL DATA:  Left ankle and foot swelling.  EXAM: CHEST  2 VIEW  COMPARISON:  CT chest 08/07/2011. Single view of the chest 12/06/2013.  FINDINGS: The lungs are clear. Heart size is upper normal. No pneumothorax or pleural effusion. Thoracic spondylosis noted.  IMPRESSION: No acute disease.   Electronically Signed   By: Inge Rise M.D.   On: 02/09/2015 21:39    CBC  Recent Labs Lab 02/09/15 2015 02/11/15 0600  WBC 6.9 6.4  HGB 13.4 11.2*  HCT 41.6 35.3*  PLT 232 218  MCV 81.9 81.9  MCH 26.4 26.0  MCHC 32.2 31.7  RDW 14.7 14.7  LYMPHSABS 1.2  --   MONOABS 0.5  --   EOSABS 0.5  --   BASOSABS 0.0  --     Chemistries   Recent Labs Lab 02/09/15 2015 02/11/15 0600  NA 136 136  K 4.0 4.1  CL 102 105  CO2 27 23  GLUCOSE 95 118*  BUN 25* 27*  CREATININE 1.85* 1.91*  CALCIUM 9.1 8.7*   ------------------------------------------------------------------------------------------------------------------ estimated creatinine clearance is 27 mL/min (by C-G formula based on Cr of  1.91). ------------------------------------------------------------------------------------------------------------------ No results for input(s): HGBA1C in the last 72 hours. ------------------------------------------------------------------------------------------------------------------ No results for input(s): CHOL, HDL, LDLCALC, TRIG, CHOLHDL, LDLDIRECT in the last 72 hours. ------------------------------------------------------------------------------------------------------------------ No results for input(s):  TSH, T4TOTAL, T3FREE, THYROIDAB in the last 72 hours.  Invalid input(s): FREET3 ------------------------------------------------------------------------------------------------------------------ No results for input(s): VITAMINB12, FOLATE, FERRITIN, TIBC, IRON, RETICCTPCT in the last 72 hours.  Coagulation profile No results for input(s): INR, PROTIME in the last 168 hours.   Recent Labs  02/09/15 2015  DDIMER 3.30*    Cardiac Enzymes No results for input(s): CKMB, TROPONINI, MYOGLOBIN in the last 168 hours.  Invalid input(s): CK ------------------------------------------------------------------------------------------------------------------ Invalid input(s): POCBNP    Iviona Hole D.O. on 02/11/2015 at 10:36 AM  Between 7am to 7pm - Pager - [phone]  After 7pm go to [email or web address] - password [email or web address]  And look for the night coverage person covering for me after hours  [facility] Office  [phone]

## 2015-02-12 LAB — BASIC METABOLIC PANEL
ANION GAP: 8 (ref 5–15)
BUN: 22 mg/dL — AB (ref 6–20)
CHLORIDE: 101 mmol/L (ref 101–111)
CO2: 24 mmol/L (ref 22–32)
Calcium: 8.6 mg/dL — ABNORMAL LOW (ref 8.9–10.3)
Creatinine, Ser: 1.58 mg/dL — ABNORMAL HIGH (ref 0.61–1.24)
GFR, EST AFRICAN AMERICAN: 45 mL/min — AB (ref >60)
GFR, EST NON AFRICAN AMERICAN: 39 mL/min — AB (ref >60)
Glucose, Bld: 138 mg/dL — ABNORMAL HIGH (ref 65–99)
POTASSIUM: 4 mmol/L (ref 3.5–5.1)
SODIUM: 133 mmol/L — AB (ref 135–145)

## 2015-02-12 LAB — CBC
HCT: 34.4 % — ABNORMAL LOW (ref 39.0–52.0)
HEMOGLOBIN: 11.4 g/dL — AB (ref 13.0–17.0)
MCH: 27 pg (ref 26.0–34.0)
MCHC: 33.1 g/dL (ref 30.0–36.0)
MCV: 81.5 fL (ref 78.0–100.0)
PLATELETS: 216 10*3/uL (ref 150–400)
RBC: 4.22 MIL/uL (ref 4.22–5.81)
RDW: 14.8 % (ref 11.5–15.5)
WBC: 5.6 10*3/uL (ref 4.0–10.5)

## 2015-02-12 NOTE — Progress Notes (Signed)
[facility]                                                                              Patient Demographics  Isaac Phelps, is a 81 y.o. male, DOB - July 13, 1930, TSV:[id number]  Admit date - 02/09/2015   Admitting Physician Etta Quill, DO  Outpatient Primary MD for the patient is [name], MD  LOS - 3   Chief Complaint  Patient presents with  . Leg Swelling      HPI on 02/09/2015 by Dr. Jennette Kettle Isaac Phelps is a 81 y.o. male seen in the ED on 9/3 for cellulitis of his RLE. Patient was discharged on clindamycin. Since then he has had worsening of symptoms and rash despite taking clindamycin. He returns to ED.  Assessment & Plan   RLE cellulitis -Patient failed outpatient treatment with clindamycin. -Currently afebrile, no leukocytosis -Pain is improving, however continues to have erythema and edema -RLE doppler: No evidence of DVT, superficial thrombosis, or Baker's cyst -Continue vancomycin -Keep leg elevated -Wound care consulted and appreciated  Hypertension -Hold lisinopril, HCTZ -will place on hydralazine PRN  Chronic Kidney disease, stage III -Baseline creatinine approximately 1.8, currently 1.58 -will hold HTN meds  -Continue to monitor BMP  Hyperlipidemia -Continue statin  Code Status: Full  Family Communication: None at bedside  Disposition Plan: Admitted.  Pending improvement in cellulitis, likely discharge in the next 24 hours.  Time Spent in minutes   30 minutes  Procedures  RLE doppler  Consults   Wound care  DVT Prophylaxis  Heparin  Lab Results  Component Value Date   PLT 216 02/12/2015    Medications  Scheduled Meds: . heparin  5,000 Units Subcutaneous 3 times per day  . simvastatin  20 mg Oral q1800  . vancomycin  1,000 mg Intravenous Q24H   Continuous Infusions:  PRN Meds:.  Antibiotics    Anti-infectives    Start     Dose/Rate Route Frequency Ordered Stop   02/10/15 2100  vancomycin  (VANCOCIN) IVPB 1000 mg/200 mL premix     1,000 mg 200 mL/hr over 60 Minutes Intravenous Every 24 hours 02/09/15 2250     02/09/15 2100  vancomycin (VANCOCIN) IVPB 1000 mg/200 mL premix     1,000 mg 200 mL/hr over 60 Minutes Intravenous  Once 02/09/15 2048 02/09/15 2234        Subjective:   [place] seen and examined today.  Patient denies pain in RLE, feels the swelling has improved on his foot.  Denies any chest pain, shortness of breath, abdominal pain, dizziness or headache.  Patient states he sees a dermatologist as he has frequent rashes.  Objective:   Filed Vitals:   02/11/15 0532 02/11/15 1608 02/11/15 2210 02/12/15 0500  BP: 135/57 140/58 124/58 139/77  Pulse: 65 64 58 67  Temp: 98.7 F (37.1 C) 98.6 F (37 C) 98.6 F (37 C) 98.1 F (36.7 C)  TempSrc: Oral Oral Oral   Resp: 18 20 16 16   Height:      Weight:      SpO2: 99% 95% 98% 100%    Wt Readings from Last 3 Encounters:  02/10/15 80.468 kg (177  lb 6.4 oz)  05/25/14 83.915 kg (185 lb)  05/25/13 81.647 kg (180 lb)     Intake/Output Summary (Last 24 hours) at 02/12/15 1008 Last data filed at 02/12/15 0503  Gross per 24 hour  Intake      0 ml  Output   1000 ml  Net  -1000 ml    Exam  General: Well developed, well nourished, NAD  HEENT: NCAT, mucous membranes moist.   Cardiovascular: S1 S2 auscultated, no murmurs, RRR  Respiratory: Clear to auscultation bilaterally  Abdomen: Soft, nontender, nondistended, + bowel sounds  Extremities: warm dry without cyanosis clubbing.  RLE -erythema, mild edema- improving on right foot. Dressings in place.  Neuro: AAOx3, nonfocal  Psych: Appropriate mood and affect  Data Review   Micro Results Recent Results (from the past 240 hour(s))  Wound culture     Status: None   Collection Time: 02/05/15  2:36 PM  Result Value Ref Range Status   Specimen Description WOUND  Final   Special Requests RIGHT LOWER LEG  Final   Gram Stain   Final    NO WBC  SEEN NO SQUAMOUS EPITHELIAL CELLS SEEN NO ORGANISMS SEEN Performed at [facility]    Culture   Final    NO GROWTH 2 DAYS Performed at [facility]    Report Status 02/08/2015 FINAL  Final  Culture, blood (routine x 2)     Status: None (Preliminary result)   Collection Time: 02/09/15 10:29 PM  Result Value Ref Range Status   Specimen Description BLOOD LEFT ANTECUBITAL  Final   Special Requests BOTTLES DRAWN AEROBIC AND ANAEROBIC 5CC  Final   Culture NO GROWTH 2 DAYS  Final   Report Status PENDING  Incomplete  Culture, blood (routine x 2)     Status: None (Preliminary result)   Collection Time: 02/09/15 10:38 PM  Result Value Ref Range Status   Specimen Description BLOOD LEFT HAND  Final   Special Requests BOTTLES DRAWN AEROBIC AND ANAEROBIC 5CC  Final   Culture NO GROWTH 2 DAYS  Final   Report Status PENDING  Incomplete    Radiology Reports Dg Chest 2 View  02/09/2015   CLINICAL DATA:  Left ankle and foot swelling.  EXAM: CHEST  2 VIEW  COMPARISON:  CT chest 08/07/2011. Single view of the chest 12/06/2013.  FINDINGS: The lungs are clear. Heart size is upper normal. No pneumothorax or pleural effusion. Thoracic spondylosis noted.  IMPRESSION: No acute disease.   Electronically Signed   By: Inge Rise M.D.   On: 02/09/2015 21:39    CBC  Recent Labs Lab 02/09/15 2015 02/11/15 0600 02/12/15 0506  WBC 6.9 6.4 5.6  HGB 13.4 11.2* 11.4*  HCT 41.6 35.3* 34.4*  PLT 232 218 216  MCV 81.9 81.9 81.5  MCH 26.4 26.0 27.0  MCHC 32.2 31.7 33.1  RDW 14.7 14.7 14.8  LYMPHSABS 1.2  --   --   MONOABS 0.5  --   --   EOSABS 0.5  --   --   BASOSABS 0.0  --   --     Chemistries   Recent Labs Lab 02/09/15 2015 02/11/15 0600 02/11/15 1304 02/12/15 0740  NA 136 136 134* 133*  K 4.0 4.1 4.0 4.0  CL 102 105 105 101  CO2 27 23 24 24   GLUCOSE 95 118* 108* 138*  BUN 25* 27* 27* 22*  CREATININE 1.85* 1.91* 1.71* 1.58*  CALCIUM 9.1 8.7* 8.5* 8.6*    ------------------------------------------------------------------------------------------------------------------  estimated creatinine clearance is 32.6 mL/min (by C-G formula based on Cr of 1.58). ------------------------------------------------------------------------------------------------------------------ No results for input(s): HGBA1C in the last 72 hours. ------------------------------------------------------------------------------------------------------------------ No results for input(s): CHOL, HDL, LDLCALC, TRIG, CHOLHDL, LDLDIRECT in the last 72 hours. ------------------------------------------------------------------------------------------------------------------ No results for input(s): TSH, T4TOTAL, T3FREE, THYROIDAB in the last 72 hours.  Invalid input(s): FREET3 ------------------------------------------------------------------------------------------------------------------ No results for input(s): VITAMINB12, FOLATE, FERRITIN, TIBC, IRON, RETICCTPCT in the last 72 hours.  Coagulation profile No results for input(s): INR, PROTIME in the last 168 hours.   Recent Labs  02/09/15 2015  DDIMER 3.30*    Cardiac Enzymes No results for input(s): CKMB, TROPONINI, MYOGLOBIN in the last 168 hours.  Invalid input(s): CK ------------------------------------------------------------------------------------------------------------------ Invalid input(s): POCBNP    Blayne Garlick D.O. on 02/12/2015 at 10:08 AM  Between 7am to 7pm - Pager - [phone]  After 7pm go to [email or web address] - password [email or web address]  And look for the night coverage person covering for me after hours  [facility] Office  [phone]

## 2015-02-12 NOTE — Consult Note (Signed)
[place] NOTE   Pharmacy Consult for :   Vancomycin Indication:  R-ankle cellulitis following failed Clindamycin therapy  Hospital Problems: Principal Problem:   Cellulitis of right ankle Active Problems:   Essential hypertension   CKD (chronic kidney disease) stage 3, GFR 30-59 ml/min   Cellulitis  Allergies: No Known Allergies  Patient Measurements: Height: 5' 1.32" (155.8 cm) Weight: 177 lb 6.4 oz (80.468 kg) IBW/kg (Calculated) : 53.04  Vancomycin Dosing Weight:  83 kg    Vital Signs: Temp: 98.1 F (36.7 C) (09/10 0500) BP: 139/77 mmHg (09/10 0500) Pulse Rate: 67 (09/10 0500)  Labs:  Recent Labs  02/09/15 2015 02/11/15 0600 02/11/15 1304 02/12/15 0506 02/12/15 0740  WBC 6.9 6.4  --  5.6  --   HGB 13.4 11.2*  --  11.4*  --   PLT 232 218  --  216  --   CREATININE 1.85* 1.91* 1.71*  --  1.58*   Estimated Creatinine Clearance: 32.6 mL/min (by C-G formula based on Cr of 1.58).  Microbiology: Recent Results (from the past 720 hour(s))  Wound culture     Status: None   Collection Time: 02/05/15  2:36 PM  Result Value Ref Range Status   Specimen Description WOUND  Final   Special Requests RIGHT LOWER LEG  Final   Gram Stain   Final    NO WBC SEEN NO SQUAMOUS EPITHELIAL CELLS SEEN NO ORGANISMS SEEN Performed at [facility]    Culture   Final    NO GROWTH 2 DAYS Performed at [facility]    Report Status 02/08/2015 FINAL  Final  Culture, blood (routine x 2)     Status: None (Preliminary result)   Collection Time: 02/09/15 10:29 PM  Result Value Ref Range Status   Specimen Description BLOOD LEFT ANTECUBITAL  Final   Special Requests BOTTLES DRAWN AEROBIC AND ANAEROBIC 5CC  Final   Culture NO GROWTH 3 DAYS  Final   Report Status PENDING  Incomplete  Culture, blood (routine x 2)     Status: None (Preliminary result)   Collection Time: 02/09/15 10:38 PM  Result Value Ref Range Status   Specimen Description BLOOD LEFT HAND   Final   Special Requests BOTTLES DRAWN AEROBIC AND ANAEROBIC 5CC  Final   Culture NO GROWTH 3 DAYS  Final   Report Status PENDING  Incomplete    Medical/Surgical History: Past Medical History  Diagnosis Date  . BENIGN PROSTATIC HYPERTROPHY 04/01/2008  . HYPERLIPIDEMIA 05/26/2007  . HYPERTENSION 02/07/2007  . TOBACCO ABUSE 04/01/2008   Past Surgical History  Procedure Laterality Date  . Tonsillectomy    . Knee arthroscopy      Current Medication[s] Include: Prior to Admission: Clindamycin x 5 days  Other active meds pending Scheduled:  Scheduled:  . heparin  5,000 Units Subcutaneous 3 times per day  . simvastatin  20 mg Oral q1800  . vancomycin  1,000 mg Intravenous Q24H   Infusion[s]: Infusions:   Antibiotic[s]: Anti-infectives    Start     Dose/Rate Route Frequency Ordered Stop   02/10/15 2100  vancomycin (VANCOCIN) IVPB 1000 mg/200 mL premix     1,000 mg 200 mL/hr over 60 Minutes Intravenous Every 24 hours 02/09/15 2250     02/09/15 2100  vancomycin (VANCOCIN) IVPB 1000 mg/200 mL premix     1,000 mg 200 mL/hr over 60 Minutes Intravenous  Once 02/09/15 2048 02/09/15 2234      Assessment: 81 y.o. male seen 9/3 for  cellulitis of his RLE who was discharged on clindamycin. He has had worsening of symptoms and rash despite taking clindamycin.  Vancomycin ordered for cellulitis coverage per Pharmacy consult. CrCl ~ 32 ml/min.  WBC 5.6, afebrile. SCr 1.58 - appears to be at baseline  Goal of Therapy:  Vancomycin trough level 10-15 mcg/ml  Plan:  Continue Vancomycin 1 gm IV q 24 hours. Monitor renal function, WBC, fever curve, follow up cultures/sensitivities, Vancomycin levels as clinically indicated, length of therapy, and follow clinical progression.  Joya San, PharmD Clinical Pharmacy Resident Pager # [phone] 02/12/2015 2:00 PM

## 2015-02-13 LAB — BASIC METABOLIC PANEL
Anion gap: 5 (ref 5–15)
BUN: 22 mg/dL — AB (ref 6–20)
CHLORIDE: 104 mmol/L (ref 101–111)
CO2: 27 mmol/L (ref 22–32)
CREATININE: 1.73 mg/dL — AB (ref 0.61–1.24)
Calcium: 8.6 mg/dL — ABNORMAL LOW (ref 8.9–10.3)
GFR calc Af Amer: 41 mL/min — ABNORMAL LOW (ref >60)
GFR calc non Af Amer: 35 mL/min — ABNORMAL LOW (ref >60)
Glucose, Bld: 108 mg/dL — ABNORMAL HIGH (ref 65–99)
POTASSIUM: 4.1 mmol/L (ref 3.5–5.1)
Sodium: 136 mmol/L (ref 135–145)

## 2015-02-13 MED ORDER — DIPHENHYDRAMINE-ZINC ACETATE 2-0.1 % EX CREA
TOPICAL_CREAM | Freq: Three times a day (TID) | CUTANEOUS | Status: DC | PRN
  Administered 2015-02-13: 1 via TOPICAL
  Filled 2015-02-13: qty 28

## 2015-02-13 NOTE — Progress Notes (Signed)
[facility]                                                                              Patient Demographics  Isaac Phelps, is a 81 y.o. male, DOB - Nov 24, 1930, IRC:[id number]  Admit date - 02/09/2015   Admitting Physician Etta Quill, DO  Outpatient Primary MD for the patient is [name], MD  LOS - 4   Chief Complaint  Patient presents with  . Leg Swelling      HPI on 02/09/2015 by Dr. Jennette Kettle Isaac Phelps is a 81 y.o. male seen in the ED on 9/3 for cellulitis of his RLE. Patient was discharged on clindamycin. Since then he has had worsening of symptoms and rash despite taking clindamycin. He returns to ED.  Assessment & Plan   RLE cellulitis -Patient failed outpatient treatment with clindamycin. -Currently afebrile, no leukocytosis -Pain is improving, however continues to have erythema and edema -RLE doppler: No evidence of DVT, superficial thrombosis, or Baker's cyst -Continue vancomycin -Keep leg elevated -Wound care consulted and appreciated -Spoke with ID, Dr. Johnnye Sima, feels that this edema could be due to toxins released by bacteria after treatment   Hypertension -Hold lisinopril, HCTZ -will place on hydralazine PRN  Chronic Kidney disease, stage III -Baseline creatinine approximately 1.8, currently 1.73 -will hold HTN meds  -Continue to monitor BMP  Hyperlipidemia -Continue statin  Code Status: Full  Family Communication: None at bedside  Disposition Plan: Admitted.  Pending improvement in cellulitis, likely discharge in the next 24 hours.  Time Spent in minutes   30 minutes  Procedures  RLE doppler  Consults   Wound care ID, Dr. Johnnye Sima, via phone  DVT Prophylaxis  Heparin  Lab Results  Component Value Date   PLT 216 02/12/2015    Medications  Scheduled Meds: . heparin  5,000 Units Subcutaneous 3 times per day  . simvastatin  20 mg Oral q1800  . vancomycin  1,000 mg Intravenous Q24H   Continuous  Infusions:  PRN Meds:.  Antibiotics    Anti-infectives    Start     Dose/Rate Route Frequency Ordered Stop   02/10/15 2100  vancomycin (VANCOCIN) IVPB 1000 mg/200 mL premix     1,000 mg 200 mL/hr over 60 Minutes Intravenous Every 24 hours 02/09/15 2250     02/09/15 2100  vancomycin (VANCOCIN) IVPB 1000 mg/200 mL premix     1,000 mg 200 mL/hr over 60 Minutes Intravenous  Once 02/09/15 2048 02/09/15 2234      Subjective:   [place] seen and examined today.  Patient denies pain in RLE. Denies any chest pain, shortness of breath, abdominal pain, dizziness or headache.    Objective:   Filed Vitals:   02/12/15 0500 02/12/15 1427 02/12/15 2118 02/13/15 0516  BP: 139/77 141/63 125/56 137/68  Pulse: 67 59 57 56  Temp: 98.1 F (36.7 C) 98.5 F (36.9 C) 98.3 F (36.8 C) 98.6 F (37 C)  TempSrc:  Oral Oral Oral  Resp: 16 20 18 18   Height:      Weight:      SpO2: 100% 100% 96% 100%    Wt Readings from Last 3 Encounters:  02/10/15 80.468 kg (177 lb 6.4 oz)  05/25/14 83.915 kg (185 lb)  05/25/13 81.647 kg (180 lb)     Intake/Output Summary (Last 24 hours) at 02/13/15 1136 Last data filed at 02/13/15 0907  Gross per 24 hour  Intake    420 ml  Output    950 ml  Net   -530 ml    Exam  General: Well developed, well nourished, NAD  HEENT: NCAT, mucous membranes moist.   Cardiovascular: S1 S2 auscultated, no murmurs, RRR  Respiratory: Clear to auscultation bilaterally  Abdomen: Soft, nontender, nondistended, + bowel sounds  Extremities: warm dry without cyanosis clubbing.  RLE -erythema, mild edema, Dressings in place.  Neuro: AAOx3, nonfocal  Psych: Appropriate mood and affect  Data Review   Micro Results Recent Results (from the past 240 hour(s))  Wound culture     Status: None   Collection Time: 02/05/15  2:36 PM  Result Value Ref Range Status   Specimen Description WOUND  Final   Special Requests RIGHT LOWER LEG  Final   Gram Stain   Final    NO WBC  SEEN NO SQUAMOUS EPITHELIAL CELLS SEEN NO ORGANISMS SEEN Performed at [facility]    Culture   Final    NO GROWTH 2 DAYS Performed at [facility]    Report Status 02/08/2015 FINAL  Final  Culture, blood (routine x 2)     Status: None (Preliminary result)   Collection Time: 02/09/15 10:29 PM  Result Value Ref Range Status   Specimen Description BLOOD LEFT ANTECUBITAL  Final   Special Requests BOTTLES DRAWN AEROBIC AND ANAEROBIC 5CC  Final   Culture NO GROWTH 3 DAYS  Final   Report Status PENDING  Incomplete  Culture, blood (routine x 2)     Status: None (Preliminary result)   Collection Time: 02/09/15 10:38 PM  Result Value Ref Range Status   Specimen Description BLOOD LEFT HAND  Final   Special Requests BOTTLES DRAWN AEROBIC AND ANAEROBIC 5CC  Final   Culture NO GROWTH 3 DAYS  Final   Report Status PENDING  Incomplete    Radiology Reports Dg Chest 2 View  02/09/2015   CLINICAL DATA:  Left ankle and foot swelling.  EXAM: CHEST  2 VIEW  COMPARISON:  CT chest 08/07/2011. Single view of the chest 12/06/2013.  FINDINGS: The lungs are clear. Heart size is upper normal. No pneumothorax or pleural effusion. Thoracic spondylosis noted.  IMPRESSION: No acute disease.   Electronically Signed   By: Inge Rise M.D.   On: 02/09/2015 21:39    CBC  Recent Labs Lab 02/09/15 2015 02/11/15 0600 02/12/15 0506  WBC 6.9 6.4 5.6  HGB 13.4 11.2* 11.4*  HCT 41.6 35.3* 34.4*  PLT 232 218 216  MCV 81.9 81.9 81.5  MCH 26.4 26.0 27.0  MCHC 32.2 31.7 33.1  RDW 14.7 14.7 14.8  LYMPHSABS 1.2  --   --   MONOABS 0.5  --   --   EOSABS 0.5  --   --   BASOSABS 0.0  --   --     Chemistries   Recent Labs Lab 02/09/15 2015 02/11/15 0600 02/11/15 1304 02/12/15 0740 02/13/15 0542  NA 136 136 134* 133* 136  K 4.0 4.1 4.0 4.0 4.1  CL 102 105 105 101 104  CO2 27 23 24 24 27   GLUCOSE 95 118* 108* 138* 108*  BUN 25* 27* 27* 22* 22*  CREATININE 1.85* 1.91* 1.71* 1.58* 1.73*  CALCIUM 9.1 8.7* 8.5* 8.6* 8.6*   ------------------------------------------------------------------------------------------------------------------ estimated creatinine clearance is 29.8 mL/min (by C-G formula based on Cr of 1.73). ------------------------------------------------------------------------------------------------------------------ No results for input(s): HGBA1C in the last 72 hours. ------------------------------------------------------------------------------------------------------------------ No results for input(s): CHOL, HDL, LDLCALC, TRIG, CHOLHDL, LDLDIRECT in the last 72 hours. ------------------------------------------------------------------------------------------------------------------ No results for input(s): TSH, T4TOTAL, T3FREE, THYROIDAB in the last 72 hours.  Invalid input(s): FREET3 ------------------------------------------------------------------------------------------------------------------ No results for input(s): VITAMINB12, FOLATE, FERRITIN, TIBC, IRON, RETICCTPCT in the last 72 hours.  Coagulation profile No results for input(s): INR, PROTIME in the last 168 hours.  No results for input(s): DDIMER in the last 72 hours.  Cardiac Enzymes No results for input(s): CKMB, TROPONINI, MYOGLOBIN in the last 168 hours.  Invalid input(s): CK ------------------------------------------------------------------------------------------------------------------ Invalid input(s): POCBNP    Tyreanna Bisesi D.O. on 02/13/2015 at 11:36 AM  Between 7am to 7pm - Pager - [phone]  After 7pm go to [email or web address] - password [email or web address]  And look for the night coverage person covering for me after hours  [facility] Office  [phone]

## 2015-02-14 ENCOUNTER — Inpatient Hospital Stay (HOSPITAL_COMMUNITY): Payer: Medicare Other

## 2015-02-14 DIAGNOSIS — L03115 Cellulitis of right lower limb: Secondary | ICD-10-CM | POA: Insufficient documentation

## 2015-02-14 DIAGNOSIS — N289 Disorder of kidney and ureter, unspecified: Secondary | ICD-10-CM

## 2015-02-14 DIAGNOSIS — R609 Edema, unspecified: Secondary | ICD-10-CM

## 2015-02-14 LAB — CULTURE, BLOOD (ROUTINE X 2)
Culture: NO GROWTH
Culture: NO GROWTH

## 2015-02-14 LAB — BASIC METABOLIC PANEL
Anion gap: 6 (ref 5–15)
BUN: 23 mg/dL — AB (ref 6–20)
CO2: 25 mmol/L (ref 22–32)
CREATININE: 1.69 mg/dL — AB (ref 0.61–1.24)
Calcium: 8.6 mg/dL — ABNORMAL LOW (ref 8.9–10.3)
Chloride: 103 mmol/L (ref 101–111)
GFR calc Af Amer: 42 mL/min — ABNORMAL LOW (ref >60)
GFR, EST NON AFRICAN AMERICAN: 36 mL/min — AB (ref >60)
Glucose, Bld: 128 mg/dL — ABNORMAL HIGH (ref 65–99)
Potassium: 4.2 mmol/L (ref 3.5–5.1)
SODIUM: 134 mmol/L — AB (ref 135–145)

## 2015-02-14 MED ORDER — DOXYCYCLINE HYCLATE 100 MG PO CAPS
100.0000 mg | ORAL_CAPSULE | Freq: Two times a day (BID) | ORAL | Status: DC
Start: 2015-02-14 — End: 2015-08-12

## 2015-02-14 MED ORDER — DIPHENHYDRAMINE-ZINC ACETATE 2-0.1 % EX CREA: TOPICAL_CREAM | Freq: Three times a day (TID) | CUTANEOUS | Status: DC | PRN

## 2015-02-14 NOTE — Consult Note (Addendum)
WOC wound follow-up consult note Reason for Consult: Requested to teach patient topical care of blistered areas on legs when discharged.  Pt familiar to [place] from previous consult on 9/8.  Left leg with intact clear fluid filled blister, .5X.5cm. Wound type: RLE with generalized edema and scattered clear fluid-filled blisters to approx 12X12cm affected area near ankle. Several have ruptured and evolved into full thickness wounds, which are red and moist, surrounded by loose peeling skin. Drainage (amount, consistency, odor) Small amt yellow drainage, no odor.  Dressing procedure/placement/frequency: Foam dressing to absorb drainage and protect from further injury. Discussed showering at home and using a washcloth to assist with removal of loose skin, then applying antibiotic ointment and gauze.  Pt verbalized understanding and denies further questions. Please re-consult if further assistance is needed. Thank-you,  Julien Girt MSN, [place], [place], [place], [place]

## 2015-02-14 NOTE — Progress Notes (Signed)
VASCULAR LAB PRELIMINARY  ARTERIAL  ABI completed:    RIGHT    LEFT    PRESSURE WAVEFORM  PRESSURE WAVEFORM  BRACHIAL 166 Tri BRACHIAL 169 Tri  DP   DP    AT 179 Tri AT 152 Bi  PT 189 Tri PT 152 Tri  PER   PER    GREAT TOE  NA GREAT TOE  NA    RIGHT LEFT  ABI 1.12 0.9     Landry Mellow, RDMS, RVT  02/14/2015, 4:42 PM

## 2015-02-14 NOTE — Progress Notes (Signed)
Patient to be dc'd after LE venous ultrasound per MD Mikhail.

## 2015-02-14 NOTE — Progress Notes (Signed)
NURSING PROGRESS NOTE  JERRON NIBLACK [id number] Discharge Data: 02/14/2015 6:26 PM Attending Provider: Cristal Ford, DO [id number],YJEHU Pilar Plate, MD   Darci Current to be D/C'd Home per MD order with all personal belongings and discharge paperwork. Patient transported to security at [employer] by [employer], security will then transport him to his care at [facility].    All IV's will be discontinued and monitored for bleeding.  All belongings will be returned to patient for patient to take home.  Last Documented Vital Signs:  Blood pressure 137/65, pulse 57, temperature 98.5 F (36.9 C), temperature source Oral, resp. rate 18, height 5' 1.32" (1.558 m), weight 80.468 kg (177 lb 6.4 oz), SpO2 100 %.  Hendricks Limes RN, BS, BSN

## 2015-02-14 NOTE — Discharge Summary (Addendum)
Physician Discharge Summary  Isaac Phelps XNA:[id number] DOB: 03-12-1931 DOA: 02/09/2015  PCP: [name], MD  Admit date: 02/09/2015 Discharge date: 02/14/2015  Time spent: 45 minutes  Recommendations for Outpatient Follow-up:  Patient will be discharged to home.  Patient will need to follow up with primary care provider within one week of discharge.  Urged patient to follow-up with his dermatologist. Patient should continue medications as prescribed.  Patient should follow a heart healthy diet.   Discharge Diagnoses:  Lower extremity cellulitis Hypertension Chronic kidney disease, stage III Hyperlipidemia  Discharge Condition: Stable  Diet recommendation: heart healthy  Filed Weights   02/09/15 2200 02/09/15 2246 02/10/15 0003  Weight: 83.008 kg (183 lb) 80.287 kg (177 lb) 80.468 kg (177 lb 6.4 oz)    History of present illness:  on 02/09/2015 by Dr. Jennette Kettle Isaac Phelps is a 81 y.o. male seen in the ED on 9/3 for cellulitis of his RLE. Patient was discharged on clindamycin. Since then he has had worsening of symptoms and rash despite taking clindamycin. He returns to ED.  Hospital Course:  RLE cellulitis -Patient failed outpatient treatment with clindamycin. -Currently afebrile, no leukocytosis -Pain is improving, however continues to have erythema and edema -RLE doppler: No evidence of DVT, superficial thrombosis, or Baker's cyst -Keep leg elevated -Wound care consulted and appreciated -Spoke with ID, Dr. Johnnye Sima, feels that this edema could be due to toxins released by bacteria after treatment  -was placed on vancomycin during hospitalization -Will discharge patient with doxycycline, instructed to stay out of the sun and take with meals. -ABI: Right 1.12, Left 0.9  Hypertension -Hold lisinopril, HCTZ -will place on hydralazine PRN  Chronic Kidney disease, stage III -Baseline creatinine approximately 1.8, currently 1.69 -HTN meds held but may be  resumed upon discharge  Hyperlipidemia -Continue statin  Procedures  RLE doppler  Consults  Wound care ID, Dr. Johnnye Sima, via phone  Discharge Exam: Filed Vitals:   02/14/15 1440  BP: 137/65  Pulse: 57  Temp: 98.5 F (36.9 C)  Resp: 18   Exam  General: Well developed, well nourished, NAD  HEENT: NCAT, mucous membranes moist.   Cardiovascular: S1 S2 auscultated, no murmurs, RRR  Respiratory: Clear to auscultation bilaterally  Abdomen: Soft, nontender, nondistended, + bowel sounds  Extremities: warm dry without cyanosis clubbing. RLE -erythema, mild edema, Dressings in place.  Neuro: AAOx3, nonfocal  Psych: Appropriate mood and affect  Discharge Instructions      Discharge Instructions    Discharge instructions    Complete by:  As directed   Patient will be discharged to home.  Patient will need to follow up with primary care provider within one week of discharge.  Urged patient to follow-up with his dermatologist. Patient should continue medications as prescribed.  Patient should follow a heart healthy diet.            Medication List    TAKE these medications        diphenhydrAMINE-zinc acetate cream  Commonly known as:  BENADRYL  Apply topically 3 (three) times daily as needed for itching.     doxycycline 100 MG capsule  Commonly known as:  VIBRAMYCIN  Take 1 capsule (100 mg total) by mouth 2 (two) times daily.     lisinopril-hydrochlorothiazide 20-25 MG per tablet  Commonly known as:  PRINZIDE,ZESTORETIC  take 1 tablet by mouth once daily     simvastatin 40 MG tablet  Commonly known as:  ZOCOR  take 1/2 tablet by mouth  once daily       No Known Allergies Follow-up Information    Follow up with [name], MD. Schedule an appointment as soon as possible for a visit on 02/18/2015.   Specialty:  Internal Medicine   Why:  Hospital follow up. 9/16//16 @ 12:30pm.    Contact information:   [place]  [phone]        The results of significant diagnostics from this hospitalization (including imaging, microbiology, ancillary and laboratory) are listed below for reference.    Significant Diagnostic Studies: Dg Chest 2 View  02/09/2015   CLINICAL DATA:  Left ankle and foot swelling.  EXAM: CHEST  2 VIEW  COMPARISON:  CT chest 08/07/2011. Single view of the chest 12/06/2013.  FINDINGS: The lungs are clear. Heart size is upper normal. No pneumothorax or pleural effusion. Thoracic spondylosis noted.  IMPRESSION: No acute disease.   Electronically Signed   By: Inge Rise M.D.   On: 02/09/2015 21:39    Microbiology: Recent Results (from the past 240 hour(s))  Wound culture     Status: None   Collection Time: 02/05/15  2:36 PM  Result Value Ref Range Status   Specimen Description WOUND  Final   Special Requests RIGHT LOWER LEG  Final   Gram Stain   Final    NO WBC SEEN NO SQUAMOUS EPITHELIAL CELLS SEEN NO ORGANISMS SEEN Performed at [facility]    Culture   Final    NO GROWTH 2 DAYS Performed at [facility]    Report Status 02/08/2015 FINAL  Final  Culture, blood (routine x 2)     Status: None   Collection Time: 02/09/15 10:29 PM  Result Value Ref Range Status   Specimen Description BLOOD LEFT ANTECUBITAL  Final   Special Requests BOTTLES DRAWN AEROBIC AND ANAEROBIC 5CC  Final   Culture NO GROWTH 5 DAYS  Final   Report Status 02/14/2015 FINAL  Final  Culture, blood (routine x 2)     Status: None   Collection Time: 02/09/15 10:38 PM  Result Value Ref Range Status   Specimen Description BLOOD LEFT HAND  Final   Special Requests BOTTLES DRAWN AEROBIC AND ANAEROBIC 5CC  Final   Culture NO GROWTH 5 DAYS  Final   Report Status 02/14/2015 FINAL  Final     Labs: Basic Metabolic Panel:  Recent Labs Lab 02/11/15 0600 02/11/15 1304 02/12/15 0740 02/13/15 0542 02/14/15 0443  NA 136 134* 133* 136 134*  K 4.1 4.0 4.0 4.1 4.2  CL 105 105 101 104  103  CO2 23 24 24 27 25   GLUCOSE 118* 108* 138* 108* 128*  BUN 27* 27* 22* 22* 23*  CREATININE 1.91* 1.71* 1.58* 1.73* 1.69*  CALCIUM 8.7* 8.5* 8.6* 8.6* 8.6*   Liver Function Tests: No results for input(s): AST, ALT, ALKPHOS, BILITOT, PROT, ALBUMIN in the last 168 hours. No results for input(s): LIPASE, AMYLASE in the last 168 hours. No results for input(s): AMMONIA in the last 168 hours. CBC:  Recent Labs Lab 02/09/15 2015 02/11/15 0600 02/12/15 0506  WBC 6.9 6.4 5.6  NEUTROABS 4.7  --   --   HGB 13.4 11.2* 11.4*  HCT 41.6 35.3* 34.4*  MCV 81.9 81.9 81.5  PLT 232 218 216   Cardiac Enzymes: No results for input(s): CKTOTAL, CKMB, CKMBINDEX, TROPONINI in the last 168 hours. BNP: BNP (last 3 results) No results for input(s): BNP in the last 8760 hours.  ProBNP (last 3 results) No results for input(s): PROBNP in the last 8760 hours.  CBG: No results for input(s): GLUCAP in the last 168 hours.     SignedCristal Ford  [facility] 02/14/2015, 5:17 PM

## 2015-02-14 NOTE — Discharge Instructions (Signed)

## 2015-02-15 NOTE — ED Notes (Signed)
Final report of wound culture shows no growth

## 2015-02-17 ENCOUNTER — Telehealth: Payer: Self-pay | Admitting: *Deleted

## 2015-02-17 NOTE — Telephone Encounter (Signed)
Pt returned your call.  

## 2015-02-17 NOTE — Telephone Encounter (Signed)
Left message on machine for patient to return our call.  Transitional care management. 

## 2015-02-18 ENCOUNTER — Ambulatory Visit (INDEPENDENT_AMBULATORY_CARE_PROVIDER_SITE_OTHER): Payer: Medicare Other | Admitting: Internal Medicine

## 2015-02-18 ENCOUNTER — Encounter: Payer: Self-pay | Admitting: Internal Medicine

## 2015-02-18 VITALS — BP 160/80 | HR 102 | Temp 98.6°F | Resp 20 | Ht 70.5 in | Wt 180.0 lb

## 2015-02-18 DIAGNOSIS — L03115 Cellulitis of right lower limb: Secondary | ICD-10-CM

## 2015-02-18 DIAGNOSIS — N183 Chronic kidney disease, stage 3 unspecified: Secondary | ICD-10-CM

## 2015-02-18 DIAGNOSIS — N289 Disorder of kidney and ureter, unspecified: Secondary | ICD-10-CM

## 2015-02-18 DIAGNOSIS — I10 Essential (primary) hypertension: Secondary | ICD-10-CM | POA: Diagnosis not present

## 2015-02-18 DIAGNOSIS — E785 Hyperlipidemia, unspecified: Secondary | ICD-10-CM

## 2015-02-18 NOTE — Telephone Encounter (Signed)
Transition Care Management Follow-up Telephone Call  How have you been since you were released from the hospital? fine   Do you understand why you were in the hospital? yes   Do you understand the discharge instrcutions? yes  Items Reviewed:  Medications reviewed: yes  Allergies reviewed: yes  Dietary changes reviewed: yes - no change  Referrals reviewed: yes   Functional Questionnaire:   Activities of Daily Living (ADLs):   He states they are independent in the following: ambulation, bathing and hygiene, feeding, continence, grooming, toileting and dressing States they require assistance with the following: none   Any transportation issues/concerns?: no   Any patient concerns? no   Confirmed importance and date/time of follow-up visits scheduled: yes   Confirmed with patient if condition begins to worsen call PCP or go to the ER.  Patient was given the Call-a-Nurse line [phone]: yes  Patient was discharged 02/14/15 Patient was discharged to home Patient has an appointment with Dr Raliegh Ip 02/18/15

## 2015-02-18 NOTE — Progress Notes (Signed)
Subjective:    Patient ID: Isaac Phelps, male    DOB: 05/17/1931, 81 y.o.   MRN: [id number]  HPI PCP: Isaac Cowden, MD  Admit date: 02/09/2015 Discharge date: 02/14/2015  Recommendations for Outpatient Follow-up:  Patient will be discharged to home. Patient will need to follow up with primary care Isaac Phelps within one week of discharge. Urged patient to follow-up with his dermatologist. Patient should continue medications as prescribed. Patient should follow a heart healthy diet.   Discharge Diagnoses:  Lower extremity cellulitis Hypertension Chronic kidney disease, stage III Hyperlipidemia  81 year old patient who is seen today following a recent hospital discharge for right lower extremity cellulitis.  He was treated with vancomycin and discharged on doxycycline.  He continues to do quite well.  He has hypertension and chronic kidney disease.  During the hospital.  Arterial Dopplers were performed and were essentially normal.  Past Medical History  Diagnosis Date  . BENIGN PROSTATIC HYPERTROPHY 04/01/2008  . HYPERLIPIDEMIA 05/26/2007  . HYPERTENSION 02/07/2007  . TOBACCO ABUSE 04/01/2008    Social History   Social History  . Marital Status: Married    Spouse Name: N/A  . Number of Children: N/A  . Years of Education: N/A   Occupational History  . Not on file.   Social History Main Topics  . Smoking status: Current Every Day Smoker -- 0.30 packs/day    Types: Cigarettes  . Smokeless tobacco: Never Used  . Alcohol Use: No  . Drug Use: No  . Sexual Activity: Not on file   Other Topics Concern  . Not on file   Social History Narrative    Past Surgical History  Procedure Laterality Date  . Tonsillectomy    . Knee arthroscopy      Family History  Problem Relation Age of Onset  . Arthritis Neg Hx     family  . Hypertension Neg Hx     family  . Stroke Neg Hx     family , 1st degree relative    No Known Allergies  Current Outpatient  Prescriptions on File Prior to Visit  Medication Sig Dispense Refill  . diphenhydrAMINE-zinc acetate (BENADRYL) cream Apply topically 3 (three) times daily as needed for itching. 28.4 g 0  . doxycycline (VIBRAMYCIN) 100 MG capsule Take 1 capsule (100 mg total) by mouth 2 (two) times daily. 12 capsule 0  . lisinopril-hydrochlorothiazide (PRINZIDE,ZESTORETIC) 20-25 MG per tablet take 1 tablet by mouth once daily 90 tablet 3  . simvastatin (ZOCOR) 40 MG tablet take 1/2 tablet by mouth once daily 90 tablet 3   No current facility-administered medications on file prior to visit.    BP 160/80 mmHg  Pulse 102  Temp(Src) 98.6 F (37 C) (Oral)  Resp 20  Ht 5' 10.5" (1.791 m)  Wt 180 lb (81.647 kg)  BMI 25.45 kg/m2  SpO2 98%      Review of Systems  Constitutional: Negative for fever, chills, appetite change and fatigue.  HENT: Negative for congestion, dental problem, ear pain, hearing loss, sore throat, tinnitus, trouble swallowing and voice change.   Eyes: Negative for pain, discharge and visual disturbance.  Respiratory: Negative for cough, chest tightness, wheezing and stridor.   Cardiovascular: Negative for chest pain, palpitations and leg swelling.  Gastrointestinal: Negative for nausea, vomiting, abdominal pain, diarrhea, constipation, blood in stool and abdominal distention.  Genitourinary: Negative for urgency, hematuria, flank pain, discharge, difficulty urinating and genital sores.  Musculoskeletal: Negative for myalgias, back pain, joint swelling, arthralgias, gait  problem and neck stiffness.  Skin: Positive for wound. Negative for rash.  Neurological: Negative for dizziness, syncope, speech difficulty, weakness, numbness and headaches.  Hematological: Negative for adenopathy. Does not bruise/bleed easily.  Psychiatric/Behavioral: Negative for behavioral problems and dysphoric mood. The patient is not nervous/anxious.        Objective:   Physical Exam  Constitutional: He is  oriented to person, place, and time. He appears well-developed.  HENT:  Head: Normocephalic.  Right Ear: External ear normal.  Left Ear: External ear normal.  Eyes: Conjunctivae and EOM are normal.  Neck: Normal range of motion.  Cardiovascular: Normal rate and normal heart sounds.   Pulmonary/Chest: Breath sounds normal.  Abdominal: Bowel sounds are normal.  Musculoskeletal: Normal range of motion. He exhibits no edema or tenderness.  Neurological: He is alert and oriented to person, place, and time.  Skin:  Resolving cellulitis with hyper pigmentary changes involving the right lower leg, especially medially.  Skin is granulating in without erythema or drainage.  No excessive warmth or tenderness  Psychiatric: He has a normal mood and affect. His behavior is normal.          Assessment & Plan:   Status post cellulitis right lower leg.  The patient will complete antibiotic therapy.  Local wound care discussed Hypertension, stable Chronic kidney disease Dyslipidemia  Recheck 3-4 months Will report any clinical deterioration

## 2015-02-18 NOTE — Progress Notes (Signed)
Pre visit review using our clinic review tool, if applicable. No additional management support is needed unless otherwise documented below in the visit note. 

## 2015-02-18 NOTE — Patient Instructions (Addendum)
Take your antibiotic as prescribed until ALL of it is gone, but stop if you develop a rash, swelling, or any side effects of the medication.  Contact our office as soon as possible if  there are side effects of the medication.  Limit your sodium (Salt) intake  Please check your blood pressure on a regular basis.  If it is consistently greater than 150/90, please make an office appointment.  Return in 3 months for follow-up   Cellulitis Cellulitis is an infection of the skin and the tissue beneath it. The infected area is usually red and tender. Cellulitis occurs most often in the arms and lower legs.  CAUSES  Cellulitis is caused by bacteria that enter the skin through cracks or cuts in the skin. The most common types of bacteria that cause cellulitis are staphylococci and streptococci. SIGNS AND SYMPTOMS   Redness and warmth.  Swelling.  Tenderness or pain.  Fever. DIAGNOSIS  Your health care Isaac Phelps can usually determine what is wrong based on a physical exam. Blood tests may also be done. TREATMENT  Treatment usually involves taking an antibiotic medicine. HOME CARE INSTRUCTIONS   Take your antibiotic medicine as directed by your health care Isaac Phelps. Finish the antibiotic even if you start to feel better.  Keep the infected arm or leg elevated to reduce swelling.  Apply a warm cloth to the affected area up to 4 times per day to relieve pain.  Take medicines only as directed by your health care Isaac Phelps.  Keep all follow-up visits as directed by your health care Isaac Phelps. SEEK MEDICAL CARE IF:   You notice red streaks coming from the infected area.  Your red area gets larger or turns dark in color.  Your bone or joint underneath the infected area becomes painful after the skin has healed.  Your infection returns in the same area or another area.  You notice a swollen bump in the infected area.  You develop new symptoms.  You have a fever. SEEK IMMEDIATE MEDICAL  CARE IF:   You feel very sleepy.  You develop vomiting or diarrhea.  You have a general ill feeling (malaise) with muscle aches and pains. MAKE SURE YOU:   Understand these instructions.  Will watch your condition.  Will get help right away if you are not doing well or get worse. Document Released: 02/28/2005 Document Revised: 10/05/2013 Document Reviewed: 08/06/2011 [facility] Patient Information 2015 [place], [place]. This information is not intended to replace advice given to you by your health care Isaac Phelps. Make sure you discuss any questions you have with your health care Isaac Phelps.

## 2015-05-19 ENCOUNTER — Ambulatory Visit: Payer: Medicare Other | Admitting: Internal Medicine

## 2015-05-20 ENCOUNTER — Ambulatory Visit: Payer: Medicare Other | Admitting: Internal Medicine

## 2015-05-31 ENCOUNTER — Encounter: Payer: Medicare Other | Admitting: Internal Medicine

## 2015-06-08 ENCOUNTER — Other Ambulatory Visit: Payer: Self-pay | Admitting: Internal Medicine

## 2015-06-15 ENCOUNTER — Encounter: Payer: Medicare Other | Admitting: Internal Medicine

## 2015-06-24 ENCOUNTER — Encounter: Payer: Self-pay | Admitting: *Deleted

## 2015-06-27 ENCOUNTER — Encounter: Admission: RE | Payer: Self-pay | Source: Ambulatory Visit

## 2015-06-27 ENCOUNTER — Ambulatory Visit: Admission: RE | Admit: 2015-06-27 | Payer: Medicare Other | Source: Ambulatory Visit | Admitting: Gastroenterology

## 2015-06-27 HISTORY — DX: Syncope and collapse: R55

## 2015-06-27 HISTORY — DX: Anxiety disorder, unspecified: F41.9

## 2015-06-27 HISTORY — DX: Atherosclerotic heart disease of native coronary artery without angina pectoris: I25.10

## 2015-06-27 HISTORY — DX: Gastro-esophageal reflux disease without esophagitis: K21.9

## 2015-06-27 SURGERY — COLONOSCOPY WITH PROPOFOL
Anesthesia: General

## 2015-08-12 ENCOUNTER — Ambulatory Visit (INDEPENDENT_AMBULATORY_CARE_PROVIDER_SITE_OTHER): Payer: Medicare Other | Admitting: Internal Medicine

## 2015-08-12 ENCOUNTER — Encounter: Payer: Self-pay | Admitting: Internal Medicine

## 2015-08-12 VITALS — BP 150/90 | HR 84 | Temp 98.8°F | Resp 20 | Ht 70.0 in | Wt 187.0 lb

## 2015-08-12 DIAGNOSIS — N183 Chronic kidney disease, stage 3 unspecified: Secondary | ICD-10-CM

## 2015-08-12 DIAGNOSIS — E785 Hyperlipidemia, unspecified: Secondary | ICD-10-CM | POA: Diagnosis not present

## 2015-08-12 DIAGNOSIS — I10 Essential (primary) hypertension: Secondary | ICD-10-CM

## 2015-08-12 DIAGNOSIS — F17200 Nicotine dependence, unspecified, uncomplicated: Secondary | ICD-10-CM

## 2015-08-12 DIAGNOSIS — Z0000 Encounter for general adult medical examination without abnormal findings: Secondary | ICD-10-CM

## 2015-08-12 LAB — CBC WITH DIFFERENTIAL/PLATELET
BASOS PCT: 0.5 % (ref 0.0–3.0)
Basophils Absolute: 0 10*3/uL (ref 0.0–0.1)
EOS ABS: 0.3 10*3/uL (ref 0.0–0.7)
EOS PCT: 3.5 % (ref 0.0–5.0)
HEMATOCRIT: 37.3 % — AB (ref 39.0–52.0)
HEMOGLOBIN: 12.2 g/dL — AB (ref 13.0–17.0)
LYMPHS PCT: 13 % (ref 12.0–46.0)
Lymphs Abs: 1.1 10*3/uL (ref 0.7–4.0)
MCHC: 32.7 g/dL (ref 30.0–36.0)
MCV: 79.9 fl (ref 78.0–100.0)
MONO ABS: 0.7 10*3/uL (ref 0.1–1.0)
Monocytes Relative: 8.3 % (ref 3.0–12.0)
Neutro Abs: 6.1 10*3/uL (ref 1.4–7.7)
Neutrophils Relative %: 74.7 % (ref 43.0–77.0)
Platelets: 215 10*3/uL (ref 150.0–400.0)
RBC: 4.67 Mil/uL (ref 4.22–5.81)
RDW: 15.5 % (ref 11.5–15.5)
WBC: 8.2 10*3/uL (ref 4.0–10.5)

## 2015-08-12 LAB — COMPREHENSIVE METABOLIC PANEL
ALBUMIN: 4.3 g/dL (ref 3.5–5.2)
ALT: 17 U/L (ref 0–53)
AST: 35 U/L (ref 0–37)
Alkaline Phosphatase: 90 U/L (ref 39–117)
BUN: 30 mg/dL — ABNORMAL HIGH (ref 6–23)
CALCIUM: 9.6 mg/dL (ref 8.4–10.5)
CHLORIDE: 101 meq/L (ref 96–112)
CO2: 28 meq/L (ref 19–32)
Creatinine, Ser: 1.68 mg/dL — ABNORMAL HIGH (ref 0.40–1.50)
GFR: 50.49 mL/min — AB (ref >60.00)
Glucose, Bld: 90 mg/dL (ref 70–99)
Potassium: 3.8 mEq/L (ref 3.5–5.1)
Sodium: 138 mEq/L (ref 135–145)
Total Bilirubin: 0.6 mg/dL (ref 0.2–1.2)
Total Protein: 8.3 g/dL (ref 6.0–8.3)

## 2015-08-12 LAB — TSH: TSH: 1.46 u[IU]/mL (ref 0.35–4.50)

## 2015-08-12 NOTE — Progress Notes (Signed)
Patient ID: Isaac Phelps, male   DOB: 1930-08-06, 82 y.o.   MRN: [id number]  Subjective:    Patient ID: Isaac Phelps, male    DOB: March 03, 1931, 82 y.o.   MRN: [id number]  Hypertension Pertinent negatives include no chest pain, headaches, neck pain, palpitations or shortness of breath.  Hyperlipidemia Pertinent negatives include no chest pain, myalgias or shortness of breath.  Pre-visit discussion using our clinic review tool. No additional management support is needed unless otherwise documented below in the visit note.  History of Present Illness:   82  year-old patient who is seen today for a comprehensive evaluation. He has a history of treated hypertension and dyslipidemia, and does remarkably well  He was evaluated earlier  3 years ago for a right-sided chest wall pain. This included a chest CT that revealed a nondistinct  lesion involving the superior aspect of the left lower lobe. A followup CT scan was recommended. This was discussed with the patient and he did not wish to proceed with a followup CT scan;  he was hospitalized briefly in September 2016 for cellulitis the right lower extremity and chest x-ray was normal His chief complaint is a diffuse intermittently pruritic rash present for greater than 3 years.  He has been evaluated by dermatology and presently is seen dermatology at the [facility] system  Here for Medicare AWV:   1. Risk factors based on Past M, S, F history: cardiovascular risk factors include hypertension, and dyslipidemia. As well as tobacco use 2. 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 Also walks daily  3. Depression/mood: no history of depression or mood disorder  4. Hearing: no deficits  5. ADL's: independent in all aspects of daily living  6. Fall Risk: low  7. Home Safety: no problems identified  8. Height, weight, &visual acuity:height and weight stable has had recent cataract extraction surgery and lens  implantation. Very pleased with the outcome  9. Counseling: heart healthy, salt restricted diet. Encouraged  10. Labs ordered based on risk factors: laboratory profile, including lipid panel will be reviewed  11. Referral Coordination- not appropriate at this time ;has declined screening colonoscopies  12. Care Plan- continue, heart healthy diet, exercise regimen  13. Cognitive Assessment- alert and oriented, with normal affect. No history of memory dysfunction. Handles all executive functioning without difficulty  14.  Preventive services will include annual health examinations.  Total smoking cessation encouraged. previously 15.  Provider list updated.  Includes primary care and ophthalmology as well as dermatology  Preventive Screening-Counseling & Management  Alcohol-Tobacco  Smoking Status: current  Smoking Cessation Counseling: yes   Current Problems (verified):   1) Preventive Health Care (ICD-V70.0)  2) Tobacco Abuse (ICD-305.1)  3) Benign Prostatic Hypertrophy (ICD-600.00)  4) Benign Prostatic Hypertrophy, Hx of (ICD-V13.8)  5) Hyperlipidemia (ICD-272.4)  6) Hypertension (ICD-401.9)   Current Medications (verified):  1) Simvastatin 40 Mg Tabs (Simvastatin) .... 1/2 Once Daily  2) Lisinopril-Hydrochlorothiazide 20-25 Mg Tabs (Lisinopril-Hydrochlorothiazide) .... One Daily   Allergies (verified):  No Known Drug Allergies   Contraindications/Deferment of Procedures/Staging:  Test/Procedure: Pneumovax vaccine  Reason for deferment: patient declined   Past History:  Past Medical History:   Hypertension  Hyperlipidemia  ongoing tobacco use  Benign prostatic hypertrophy  Chronic kidney disease  Past Surgical History:   Tonsillectomy  Arthroscopic Knee surgery  no prior colonoscopy-declines   Family History:   Family History of Arthritis  Family History Hypertension  Family History  of Stroke M 1st degree relative <50  father died age 38 of probable CVA  mother  died at age 35  One brother one sister in good health. No family history cancer   Social History:    Retired works part-time as a Presenter, broadcasting  Married  Current Smoker  one child    Review of Systems  Constitutional: Negative for fever, chills, activity change, appetite change and fatigue.  HENT: Negative for congestion, dental problem, ear pain, hearing loss, mouth sores, rhinorrhea, sinus pressure, sneezing, tinnitus, trouble swallowing and voice change.   Eyes: Negative for photophobia, pain, redness and visual disturbance.  Respiratory: Negative for apnea, cough, choking, chest tightness, shortness of breath and wheezing.   Cardiovascular: Negative for chest pain, palpitations and leg swelling.  Gastrointestinal: Negative for nausea, vomiting, abdominal pain, diarrhea, constipation, blood in stool, abdominal distention, anal bleeding and rectal pain.  Genitourinary: Negative for dysuria, urgency, frequency, hematuria, flank pain, decreased urine volume, discharge, penile swelling, scrotal swelling, difficulty urinating, genital sores and testicular pain.  Musculoskeletal: Negative for myalgias, back pain, joint swelling, arthralgias, gait problem, neck pain and neck stiffness.       Chronic left shoulder pain  Skin: Negative for color change, rash and wound.  Neurological: Negative for dizziness, tremors, seizures, syncope, facial asymmetry, speech difficulty, weakness, light-headedness, numbness and headaches.  Hematological: Negative for adenopathy. Does not bruise/bleed easily.  Psychiatric/Behavioral: Negative for suicidal ideas, hallucinations, behavioral problems, confusion, sleep disturbance, self-injury, dysphoric mood, decreased concentration and agitation. The patient is not nervous/anxious.        Objective:   Physical Exam  Constitutional: He appears well-developed and well-nourished.  Repeat blood pressure 140/82  HENT:  Head: Normocephalic and atraumatic.   Right Ear: External ear normal.  Left Ear: External ear normal.  Nose: Nose normal.  Mouth/Throat: Oropharynx is clear and moist.  Dentures noted  Eyes: Conjunctivae and EOM are normal. Pupils are equal, round, and reactive to light. No scleral icterus.  Neck: Normal range of motion. Neck supple. No JVD present. No thyromegaly present.  Cardiovascular: Regular rhythm, normal heart sounds and intact distal pulses.  Exam reveals no gallop and no friction rub.   No murmur heard. Decreased left dorsalis pedis pulse  Pulmonary/Chest: Effort normal and breath sounds normal. He exhibits no tenderness.  Abdominal: Soft. Bowel sounds are normal. He exhibits no distension and no mass. There is no tenderness.  Bilateral femoral bruits  Genitourinary: Penis normal.  Musculoskeletal: Normal range of motion. He exhibits edema. He exhibits no tenderness.  Mild left ankle edema  Lymphadenopathy:    He has no cervical adenopathy.  Neurological: He is alert. He has normal reflexes. No cranial nerve deficit. Coordination normal.  Skin: Skin is warm and dry. No rash noted.  Non-Pruritic scattered nonblanching macular rash most prominent over the upper back, anterior chest and arms.  Also present in the groin area  Psychiatric: He has a normal mood and affect. His behavior is normal.          Assessment & Plan:   Preventive health Hypertension stable BPH Dyslipidemia Ongoing tobacco use Probable atopic dermatitis  Laboratory update will be obtained Medications refilled Smoking cessation encouraged Recheck one year

## 2015-08-12 NOTE — Progress Notes (Signed)
Pre visit review using our clinic review tool, if applicable. No additional management support is needed unless otherwise documented below in the visit note. 

## 2015-08-12 NOTE — Patient Instructions (Addendum)
Limit your sodium (Salt) intake  Please check your blood pressure on a regular basis.  If it is consistently greater than 150/90, please make an office appointment.    It is important that you exercise regularly, at least 20 minutes 3 to 4 times per week.  If you develop chest pain or shortness of breath seek  medical attention.  Smoking tobacco is very bad for your health. You should stop smoking immediately.  Return in one year for follow-up Eczema Eczema, also called atopic dermatitis, is a skin disorder that causes inflammation of the skin. It causes a red rash and dry, scaly skin. The skin becomes very itchy. Eczema is generally worse during the cooler winter months and often improves with the warmth of summer. Eczema usually starts showing signs in infancy. Some children outgrow eczema, but it may last through adulthood.  CAUSES  The exact cause of eczema is not known, but it appears to run in families. People with eczema often have a family history of eczema, allergies, asthma, or hay fever. Eczema is not contagious. Flare-ups of the condition may be caused by:   Contact with something you are sensitive or allergic to.   Stress. SIGNS AND SYMPTOMS  Dry, scaly skin.   Red, itchy rash.   Itchiness. This may occur before the skin rash and may be very intense.  DIAGNOSIS  The diagnosis of eczema is usually made based on symptoms and medical history. TREATMENT  Eczema cannot be cured, but symptoms usually can be controlled with treatment and other strategies. A treatment plan might include:  Controlling the itching and scratching.   Use over-the-counter antihistamines as directed for itching. This is especially useful at night when the itching tends to be worse.   Use over-the-counter steroid creams as directed for itching.   Avoid scratching. Scratching makes the rash and itching worse. It may also result in a skin infection (impetigo) due to a break in the skin caused  by scratching.   Keeping the skin well moisturized with creams every day. This will seal in moisture and help prevent dryness. Lotions that contain alcohol and water should be avoided because they can dry the skin.   Limiting exposure to things that you are sensitive or allergic to (allergens).   Recognizing situations that cause stress.   Developing a plan to manage stress.  HOME CARE INSTRUCTIONS   Only take over-the-counter or prescription medicines as directed by your health care provider.   Do not use anything on the skin without checking with your health care provider.   Keep baths or showers short (5 minutes) in warm (not hot) water. Use mild cleansers for bathing. These should be unscented. You may add nonperfumed bath oil to the bath water. It is best to avoid soap and bubble bath.   Immediately after a bath or shower, when the skin is still damp, apply a moisturizing ointment to the entire body. This ointment should be a petroleum ointment. This will seal in moisture and help prevent dryness. The thicker the ointment, the better. These should be unscented.   Keep fingernails cut short. Children with eczema may need to wear soft gloves or mittens at night after applying an ointment.   Dress in clothes made of cotton or cotton blends. Dress lightly, because heat increases itching.   A child with eczema should stay away from anyone with fever blisters or cold sores. The virus that causes fever blisters (herpes simplex) can cause a serious skin infection  in children with eczema. SEEK MEDICAL CARE IF:   Your itching interferes with sleep.   Your rash gets worse or is not better within 1 week after starting treatment.   You see pus or soft yellow scabs in the rash area.   You have a fever.   You have a rash flare-up after contact with someone who has fever blisters.    This information is not intended to replace advice given to you by your health care provider.  Make sure you discuss any questions you have with your health care provider.   Document Released: 05/18/2000 Document Revised: 03/11/2013 Document Reviewed: 12/22/2012 Elsevier Interactive Patient Education [employer].

## 2015-12-14 ENCOUNTER — Ambulatory Visit (INDEPENDENT_AMBULATORY_CARE_PROVIDER_SITE_OTHER): Payer: Medicare Other | Admitting: Internal Medicine

## 2015-12-14 ENCOUNTER — Encounter: Payer: Self-pay | Admitting: Internal Medicine

## 2015-12-14 VITALS — BP 160/80 | HR 77 | Temp 97.8°F | Resp 20 | Ht 70.0 in | Wt 185.0 lb

## 2015-12-14 DIAGNOSIS — N183 Chronic kidney disease, stage 3 unspecified: Secondary | ICD-10-CM

## 2015-12-14 DIAGNOSIS — M19049 Primary osteoarthritis, unspecified hand: Secondary | ICD-10-CM

## 2015-12-14 DIAGNOSIS — I10 Essential (primary) hypertension: Secondary | ICD-10-CM | POA: Diagnosis not present

## 2015-12-14 DIAGNOSIS — M1990 Unspecified osteoarthritis, unspecified site: Secondary | ICD-10-CM

## 2015-12-14 LAB — URIC ACID: URIC ACID, SERUM: 9 mg/dL — AB (ref 4.0–7.8)

## 2015-12-14 MED ORDER — PREDNISONE 20 MG PO TABS: 20.0000 mg | ORAL_TABLET | Freq: Two times a day (BID) | ORAL | Status: DC

## 2015-12-14 NOTE — Patient Instructions (Signed)
Limit your sodium (Salt) intake  Please check your blood pressure on a regular basis.  If it is consistently greater than 150/90, please make an office appointment.  Call or return to clinic prn if these symptoms worsen or fail to improve as anticipated.  Return in 3 months for follow-up

## 2015-12-14 NOTE — Progress Notes (Signed)
Subjective:    Patient ID: Isaac Phelps, male    DOB: [birth year], 82 y.o.   MRN: [id number]  HPI 82 year old patient who has a history of essential hypertension, chronic kidney disease and a prior history of right lower extremity cellulitis.  He presents with a chief complaint of swelling involving the right hand and wrist.  There is been no history of trauma.  No fever or other constitutional complaints.  He denies any redness, significant pain other than tightness.  No prior history of gout  Past Medical History  Diagnosis Date  . BENIGN PROSTATIC HYPERTROPHY 04/01/2008  . HYPERLIPIDEMIA 05/26/2007  . HYPERTENSION 02/07/2007  . TOBACCO ABUSE 04/01/2008  . Anxiety   . Coronary artery disease   . GERD (gastroesophageal reflux disease)   . Syncope and collapse      Social History   Social History  . Marital Status: Married    Spouse Name: N/A  . Number of Children: N/A  . Years of Education: N/A   Occupational History  . Not on file.   Social History Main Topics  . Smoking status: Current Every Day Smoker -- 0.30 packs/day    Types: Cigarettes  . Smokeless tobacco: Never Used  . Alcohol Use: No  . Drug Use: No  . Sexual Activity: Not on file   Other Topics Concern  . Not on file   Social History Narrative    Past Surgical History  Procedure Laterality Date  . Tonsillectomy    . Knee arthroscopy    . Coronary angioplasty    . Tonsillectomy    . Appendectomy      Family History  Problem Relation Age of Onset  . Arthritis Neg Hx     family  . Hypertension Neg Hx     family  . Stroke Neg Hx     family , 1st degree relative    Allergies  Allergen Reactions  . Sulfa Antibiotics     Current Outpatient Prescriptions on File Prior to Visit  Medication Sig Dispense Refill  . clobetasol ointment (TEMOVATE) [id number] % Apply 1 application topically 2 (two) times daily.   0  . diphenhydrAMINE-zinc acetate (BENADRYL) cream Apply topically 3 (three) times daily as  needed for itching. 28.4 g 0  . lisinopril-hydrochlorothiazide (PRINZIDE,ZESTORETIC) 20-25 MG tablet take 1 tablet by mouth once daily 90 tablet 3  . simvastatin (ZOCOR) 40 MG tablet take 1/2 tablet by mouth once daily 90 tablet 3   No current facility-administered medications on file prior to visit.    BP 160/80 mmHg  Pulse 77  Temp(Src) 97.8 F (36.6 C) (Oral)  Resp 20  Ht 5\' 10"  (1.778 m)  Wt 185 lb (83.915 kg)  BMI 26.54 kg/m2  SpO2 98%      Review of Systems  Constitutional: Negative for fever, chills, appetite change and fatigue.  HENT: Negative for congestion, dental problem, ear pain, hearing loss, sore throat, tinnitus, trouble swallowing and voice change.   Eyes: Negative for pain, discharge and visual disturbance.  Respiratory: Negative for cough, chest tightness, wheezing and stridor.   Cardiovascular: Negative for chest pain, palpitations and leg swelling.  Gastrointestinal: Negative for nausea, vomiting, abdominal pain, diarrhea, constipation, blood in stool and abdominal distention.  Genitourinary: Negative for urgency, hematuria, flank pain, discharge, difficulty urinating and genital sores.  Musculoskeletal: Negative for myalgias, back pain, joint swelling, arthralgias, gait problem and neck stiffness.       Swelling right hand and wrist  Skin: Negative  for rash.  Neurological: Negative for dizziness, syncope, speech difficulty, weakness, numbness and headaches.  Hematological: Negative for adenopathy. Does not bruise/bleed easily.  Psychiatric/Behavioral: Negative for behavioral problems and dysphoric mood. The patient is not nervous/anxious.        Objective:   Physical Exam  Constitutional:  Repeat blood pressure 140/82  Musculoskeletal:  Soft tissue swelling entire right hand and wrist, more prominent involving the dorsal surface.  No significant pain, no tenderness or erythema.  No fluctuance.  Patient difficult time with full finger flexion due to the  soft tissue swelling          Assessment & Plan:   Right hand and wrist  edema.  Unclear etiology.  Will check a uric acid level.  Will treat with prednisone 20 mg twice a day for a week.  He will report any clinical worsening or new symptoms Essential hypertension, stable Chronic kidney disease  [name], MD

## 2015-12-14 NOTE — Progress Notes (Signed)
Pre visit review using our clinic review tool, if applicable. No additional management support is needed unless otherwise documented below in the visit note. 

## 2015-12-16 MED ORDER — LISINOPRIL 20 MG PO TABS: 20.0000 mg | ORAL_TABLET | Freq: Every day | ORAL | Status: DC

## 2015-12-16 NOTE — Addendum Note (Signed)
Addended by: Jerl Santos R on: 12/16/2015 05:13 PM   Modules accepted: Orders, Medications

## 2015-12-30 ENCOUNTER — Encounter: Payer: Self-pay | Admitting: Internal Medicine

## 2015-12-30 ENCOUNTER — Ambulatory Visit (INDEPENDENT_AMBULATORY_CARE_PROVIDER_SITE_OTHER): Payer: Medicare Other | Admitting: Internal Medicine

## 2015-12-30 VITALS — BP 170/80 | HR 65 | Temp 98.3°F | Ht 70.0 in | Wt 190.0 lb

## 2015-12-30 DIAGNOSIS — M109 Gout, unspecified: Secondary | ICD-10-CM

## 2015-12-30 DIAGNOSIS — I10 Essential (primary) hypertension: Secondary | ICD-10-CM | POA: Diagnosis not present

## 2015-12-30 DIAGNOSIS — M1009 Idiopathic gout, multiple sites: Secondary | ICD-10-CM

## 2015-12-30 DIAGNOSIS — N183 Chronic kidney disease, stage 3 unspecified: Secondary | ICD-10-CM

## 2015-12-30 DIAGNOSIS — E785 Hyperlipidemia, unspecified: Secondary | ICD-10-CM | POA: Diagnosis not present

## 2015-12-30 MED ORDER — FUROSEMIDE 40 MG PO TABS: 40.0000 mg | ORAL_TABLET | Freq: Every day | ORAL | 3 refills | Status: DC

## 2015-12-30 MED ORDER — FUROSEMIDE 40 MG PO TABS: 40.0000 mg | ORAL_TABLET | Freq: Once | ORAL | Status: DC

## 2015-12-30 MED ORDER — PREDNISONE 20 MG PO TABS: 20.0000 mg | ORAL_TABLET | Freq: Two times a day (BID) | ORAL | 0 refills | Status: DC

## 2015-12-30 MED ORDER — ALLOPURINOL 100 MG PO TABS: 100.0000 mg | ORAL_TABLET | Freq: Every day | ORAL | 6 refills | Status: DC

## 2015-12-30 NOTE — Patient Instructions (Addendum)
Limit your sodium (Salt) intake  Furosemide 40 mg every morning  Prednisone as directed  Low-Purine Diet Purines are compounds that affect the level of uric acid in your body. A low-purine diet is a diet that is low in purines. Eating a low-purine diet can prevent the level of uric acid in your body from getting too high and causing gout or kidney stones or both. WHAT DO I NEED TO KNOW ABOUT THIS DIET?  Choose low-purine foods. Examples of low-purine foods are listed in the next section.  Drink plenty of fluids, especially water. Fluids can help remove uric acid from your body. Try to drink 8-16 cups (1.9-3.8 L) a day.  Limit foods high in fat, especially saturated fat, as fat makes it harder for the body to get rid of uric acid. Foods high in saturated fat include pizza, cheese, ice cream, whole milk, fried foods, and gravies. Choose foods that are lower in fat and lean sources of protein. Use olive oil when cooking as it contains healthy fats that are not high in saturated fat.  Limit alcohol. Alcohol interferes with the elimination of uric acid from your body. If you are having a gout attack, avoid all alcohol.  Keep in mind that different people's bodies react differently to different foods. You will probably learn over time which foods do or do not affect you. If you discover that a food tends to cause your gout to flare up, avoid eating that food. You can more freely enjoy foods that do not cause problems. If you have any questions about a food item, talk to your dietitian or health care provider. WHICH FOODS ARE LOW, MODERATE, AND HIGH IN PURINES? The following is a list of foods that are low, moderate, and high in purines. You can eat any amount of the foods that are low in purines. You may be able to have small amounts of foods that are moderate in purines. Ask your health care provider how much of a food moderate in purines you can have. Avoid foods high in purines. Grains  Foods low  in purines: Enriched white bread, pasta, rice, cake, cornbread, popcorn.  Foods moderate in purines: Whole-grain breads and cereals, wheat germ, bran, oatmeal. Uncooked oatmeal. Dry wheat bran or wheat germ.  Foods high in purines: Pancakes, [place] toast, biscuits, muffins. Vegetables  Foods low in purines: All vegetables, except those that are moderate in purines.  Foods moderate in purines: Asparagus, cauliflower, spinach, mushrooms, green peas. Fruits  All fruits are low in purines. Meats and other Protein Foods  Foods low in purines: Eggs, nuts, peanut butter.  Foods moderate in purines: 80-90% lean beef, lamb, veal, pork, poultry, fish, eggs, peanut butter, nuts. Crab, lobster, oysters, and shrimp. Cooked dried beans, peas, and lentils.  Foods high in purines: Anchovies, sardines, herring, mussels, tuna, codfish, scallops, trout, and haddock. Isaac Phelps. Organ meats (such as liver or kidney). Tripe. Game meat. Goose. Sweetbreads. Dairy  All dairy foods are low in purines. Low-fat and fat-free dairy products are best because they are low in saturated fat. Beverages  Drinks low in purines: Water, carbonated beverages, tea, coffee, cocoa.  Drinks moderate in purines: Soft drinks and other drinks sweetened with high-fructose corn syrup. Juices. To find whether a food or drink is sweetened with high-fructose corn syrup, look at the ingredients list.  Drinks high in purines: Alcoholic beverages (such as beer). Condiments  Foods low in purines: Salt, herbs, olives, pickles, relishes, vinegar.  Foods moderate in purines:  Butter, margarine, oils, mayonnaise. Fats and Oils  Foods low in purines: All types, except gravies and sauces made with meat.  Foods high in purines: Gravies and sauces made with meat. Other Foods  Foods low in purines: Sugars, sweets, gelatin. Cake. Soups made without meat.  Foods moderate in purines: Meat-based or fish-based soups, broths, or bouillons. Foods  and drinks sweetened with high-fructose corn syrup.  Foods high in purines: High-fat desserts (such as ice cream, cookies, cakes, pies, doughnuts, and chocolate). Contact your dietitian for more information on foods that are not listed here.   This information is not intended to replace advice given to you by your health care provider. Make sure you discuss any questions you have with your health care provider.   Document Released: 09/15/2010 Document Revised: 05/26/2013 Document Reviewed: 04/27/2013 Elsevier Interactive Patient Education 2016 [place].  Gout Gout is when your joints become red, sore, and swell (inflamed). This is caused by the buildup of uric acid crystals in the joints. Uric acid is a chemical that is normally in the blood. If the level of uric acid gets too high in the blood, these crystals form in your joints and tissues. Over time, these crystals can form into masses near the joints and tissues. These masses can destroy bone and cause the bone to look misshapen (deformed). HOME CARE   Do not take aspirin for pain.  Only take medicine as told by your doctor.  Rest the joint as much as you can. When in bed, keep sheets and blankets off painful areas.  Keep the sore joints raised (elevated).  Put warm or cold packs on painful joints. Use of warm or cold packs depends on which works best for you.  Use crutches if the painful joint is in your leg.  Drink enough fluids to keep your pee (urine) clear or pale yellow. Limit alcohol, sugary drinks, and drinks with fructose in them.  Follow your diet instructions. Pay careful attention to how much protein you eat. Include fruits, vegetables, whole grains, and fat-free or low-fat milk products in your daily diet. Talk to your doctor or dietitian about the use of coffee, vitamin C, and cherries. These may help lower uric acid levels.  Keep a healthy body weight. GET HELP RIGHT AWAY IF:   You have watery poop (diarrhea),  throw up (vomit), or have any side effects from medicines.  You do not feel better in 24 hours, or you are getting worse.  Your joint becomes suddenly more tender, and you have chills or a fever. MAKE SURE YOU:   Understand these instructions.  Will watch your condition.  Will get help right away if you are not doing well or get worse.   This information is not intended to replace advice given to you by your health care provider. Make sure you discuss any questions you have with your health care provider.   Document Released: 02/28/2008 Document Revised: 06/11/2014 Document Reviewed: 01/02/2012 Elsevier Interactive Patient Education [employer].

## 2015-12-30 NOTE — Progress Notes (Signed)
Subjective:    Patient ID: Isaac Phelps, male    DOB: [birth year], 82 y.o.   MRN: [id number]  HPI  82 year old patient who presents with recurrent pain and swelling involving his right wrist.  He was treated recently with prednisone and responded very nicely.  Uric acid level was elevated and hydrochlorothiazide was discontinued for the past few days he has had recurrent pain and swelling He is also noted increasing swelling involving the feet and ankles, left greater than the right. No lisinopril today  Past Medical History:  Diagnosis Date  . Anxiety   . BENIGN PROSTATIC HYPERTROPHY 04/01/2008  . Coronary artery disease   . GERD (gastroesophageal reflux disease)   . HYPERLIPIDEMIA 05/26/2007  . HYPERTENSION 02/07/2007  . Syncope and collapse   . TOBACCO ABUSE 04/01/2008     Social History   Social History  . Marital status: Married    Spouse name: N/A  . Number of children: N/A  . Years of education: N/A   Occupational History  . Not on file.   Social History Main Topics  . Smoking status: Current Every Day Smoker    Packs/day: 0.30    Types: Cigarettes  . Smokeless tobacco: Never Used  . Alcohol use No  . Drug use: No  . Sexual activity: Not on file   Other Topics Concern  . Not on file   Social History Narrative  . No narrative on file    Past Surgical History:  Procedure Laterality Date  . APPENDECTOMY    . CORONARY ANGIOPLASTY    . KNEE ARTHROSCOPY    . TONSILLECTOMY    . TONSILLECTOMY      Family History  Problem Relation Age of Onset  . Arthritis Neg Hx     family  . Hypertension Neg Hx     family  . Stroke Neg Hx     family , 1st degree relative    Allergies  Allergen Reactions  . Sulfa Antibiotics     Current Outpatient Prescriptions on File Prior to Visit  Medication Sig Dispense Refill  . clobetasol ointment (TEMOVATE) AB-123456789 % Apply 1 application topically 2 (two) times daily.   0  . diphenhydrAMINE-zinc acetate (BENADRYL) cream  Apply topically 3 (three) times daily as needed for itching. 28.4 g 0  . lisinopril (PRINIVIL,ZESTRIL) 20 MG tablet Take 1 tablet (20 mg total) by mouth daily. 90 tablet 0  . simvastatin (ZOCOR) 40 MG tablet take 1/2 tablet by mouth once daily 90 tablet 3   No current facility-administered medications on file prior to visit.     BP (!) 170/80 (BP Location: Left Arm, Patient Position: Sitting, Cuff Size: Normal)   Pulse 65   Temp 98.3 F (36.8 C) (Oral)   Ht 5\' 10"  (1.778 m)   Wt 190 lb (86.2 kg)   SpO2 98%   BMI 27.26 kg/m     Review of Systems  Constitutional: Negative for appetite change, chills, fatigue and fever.  HENT: Negative for congestion, dental problem, ear pain, hearing loss, sore throat, tinnitus, trouble swallowing and voice change.   Eyes: Negative for pain, discharge and visual disturbance.  Respiratory: Negative for cough, chest tightness, wheezing and stridor.   Cardiovascular: Positive for leg swelling. Negative for chest pain and palpitations.  Gastrointestinal: Negative for abdominal distention, abdominal pain, blood in stool, constipation, diarrhea, nausea and vomiting.  Genitourinary: Negative for difficulty urinating, discharge, flank pain, genital sores, hematuria and urgency.  Musculoskeletal: Positive for arthralgias  and joint swelling. Negative for back pain, gait problem, myalgias and neck stiffness.  Skin: Negative for rash.  Neurological: Negative for dizziness, syncope, speech difficulty, weakness, numbness and headaches.  Hematological: Negative for adenopathy. Does not bruise/bleed easily.  Psychiatric/Behavioral: Negative for behavioral problems and dysphoric mood. The patient is not nervous/anxious.        Objective:   Physical Exam  Constitutional: He appears well-developed and well-nourished. No distress.  Musculoskeletal: He exhibits edema.  Soft tissue swelling, tenderness and excessive warmth involving his right wrist  Lower extremity  edema involving the ankle and feet, left more prominent than the right          Assessment & Plan:   Recurrent gouty arthritis.  Will retreat with prednisone for 7 days Place on Lasix 40 mg daily  Once acute gout resolves.  We'll start allopurinol 100 mg daily  Recheck 6 weeks with lab including electrolytes and uric acid level  [name], MD

## 2016-01-27 ENCOUNTER — Ambulatory Visit (INDEPENDENT_AMBULATORY_CARE_PROVIDER_SITE_OTHER): Payer: Medicare Other | Admitting: Family Medicine

## 2016-01-27 ENCOUNTER — Encounter: Payer: Self-pay | Admitting: Family Medicine

## 2016-01-27 VITALS — BP 120/70 | HR 81 | Temp 98.6°F | Ht 70.0 in | Wt 182.7 lb

## 2016-01-27 DIAGNOSIS — M109 Gout, unspecified: Secondary | ICD-10-CM

## 2016-01-27 DIAGNOSIS — M10031 Idiopathic gout, right wrist: Secondary | ICD-10-CM

## 2016-01-27 DIAGNOSIS — I10 Essential (primary) hypertension: Secondary | ICD-10-CM | POA: Diagnosis not present

## 2016-01-27 DIAGNOSIS — N183 Chronic kidney disease, stage 3 unspecified: Secondary | ICD-10-CM

## 2016-01-27 MED ORDER — PREDNISONE 10 MG PO TABS: ORAL_TABLET | ORAL | 0 refills | Status: DC

## 2016-01-27 NOTE — Progress Notes (Signed)
Pre visit review using our clinic review tool, if applicable. No additional management support is needed unless otherwise documented below in the visit note. 

## 2016-01-27 NOTE — Patient Instructions (Signed)
Start the prednisone taper.  Follow up with your doctor in 2 weeks, sooner if worsening or new concerns.

## 2016-01-27 NOTE — Progress Notes (Signed)
HPI:   Acute visit for:  Recurrent gout: -R wrist -hx same in R wrist and L ankles -on allopurinol recently, elevated uric acid prior to initiation -responded well to prednisone in the past -PMH sig CKD, CAD, HTN - hct discontinued 2ndary to gout  ROS: See pertinent positives and negatives per HPI.  Past Medical History:  Diagnosis Date  . Anxiety   . BENIGN PROSTATIC HYPERTROPHY 04/01/2008  . Coronary artery disease   . GERD (gastroesophageal reflux disease)   . HYPERLIPIDEMIA 05/26/2007  . HYPERTENSION 02/07/2007  . Syncope and collapse   . TOBACCO ABUSE 04/01/2008    Past Surgical History:  Procedure Laterality Date  . APPENDECTOMY    . CORONARY ANGIOPLASTY    . KNEE ARTHROSCOPY    . TONSILLECTOMY    . TONSILLECTOMY      Family History  Problem Relation Age of Onset  . Arthritis Neg Hx     family  . Hypertension Neg Hx     family  . Stroke Neg Hx     family , 1st degree relative    Social History   Social History  . Marital status: Married    Spouse name: N/A  . Number of children: N/A  . Years of education: N/A   Social History Main Topics  . Smoking status: Current Every Day Smoker    Packs/day: 0.30    Types: Cigarettes  . Smokeless tobacco: Never Used  . Alcohol use No  . Drug use: No  . Sexual activity: Not Asked   Other Topics Concern  . None   Social History Narrative  . None     Current Outpatient Prescriptions:  .  allopurinol (ZYLOPRIM) 100 MG tablet, Take 1 tablet (100 mg total) by mouth daily., Disp: 30 tablet, Rfl: 6 .  clobetasol ointment (TEMOVATE) AB-123456789 %, Apply 1 application topically 2 (two) times daily. , Disp: , Rfl: 0 .  diphenhydrAMINE-zinc acetate (BENADRYL) cream, Apply topically 3 (three) times daily as needed for itching., Disp: 28.4 g, Rfl: 0 .  furosemide (LASIX) 40 MG tablet, Take 1 tablet (40 mg total) by mouth daily., Disp: 30 tablet, Rfl: 3 .  lisinopril (PRINIVIL,ZESTRIL) 20 MG tablet, Take 1 tablet (20 mg  total) by mouth daily., Disp: 90 tablet, Rfl: 0 .  simvastatin (ZOCOR) 40 MG tablet, take 1/2 tablet by mouth once daily, Disp: 90 tablet, Rfl: 3 .  predniSONE (DELTASONE) 10 MG tablet, 30mg  (3 tablets) daily for 3-5 days until improving then taper as follows: 20mg  (2 tablets) daily for 3 days 10mg (1 tablet) daily for 3 days 5mg  (1/2 tablet) daily for 3 days, Disp: 26 tablet, Rfl: 0  EXAM:  Vitals:   01/27/16 1106  BP: 120/70  Pulse: 81  Temp: 98.6 F (37 C)    Body mass index is 26.21 kg/m.  GENERAL: vitals reviewed and listed above, alert, oriented, appears well hydrated and in no acute distress  HEENT: atraumatic, conjunttiva clear, no obvious abnormalities on inspection of external nose and ears  NECK: no obvious masses on inspection  LUNGS: clear to auscultation bilaterally, no wheezes, rales or rhonchi, good air movement  CV: HRRR, no peripheral edema  MS: moves all extremities without noticeable abnormality, edema R wrist and hand  PSYCH: pleasant and cooperative, no obvious depression or anxiety  ASSESSMENT AND PLAN:  Discussed the following assessment and plan:  Acute gout of right wrist, unspecified cause  CKD (chronic kidney disease) stage 3, GFR 30-59 ml/min  Essential hypertension  -  will treat with prednisone and taper slowly given recurrence after recent flare -follow up with PCP in 2-4 weeks for recheck uric acid and titration allopurinol -Patient advised to return or notify a doctor immediately if symptoms worsen or persist or new concerns arise.  Patient Instructions  Start the prednisone taper.  Follow up with your doctor in 2 weeks, sooner if worsening or new concerns.   Colin Benton R., DO

## 2016-02-14 ENCOUNTER — Other Ambulatory Visit: Payer: Self-pay | Admitting: Family Medicine

## 2016-02-20 ENCOUNTER — Encounter: Payer: Self-pay | Admitting: Internal Medicine

## 2016-02-20 ENCOUNTER — Ambulatory Visit (INDEPENDENT_AMBULATORY_CARE_PROVIDER_SITE_OTHER): Payer: Medicare Other | Admitting: Internal Medicine

## 2016-02-20 VITALS — BP 150/70 | HR 72 | Temp 98.3°F | Resp 20 | Ht 70.0 in | Wt 180.4 lb

## 2016-02-20 DIAGNOSIS — M1009 Idiopathic gout, multiple sites: Secondary | ICD-10-CM

## 2016-02-20 DIAGNOSIS — N183 Chronic kidney disease, stage 3 unspecified: Secondary | ICD-10-CM

## 2016-02-20 DIAGNOSIS — I10 Essential (primary) hypertension: Secondary | ICD-10-CM

## 2016-02-20 DIAGNOSIS — M109 Gout, unspecified: Secondary | ICD-10-CM

## 2016-02-20 MED ORDER — FUROSEMIDE 40 MG PO TABS: ORAL_TABLET | ORAL | 3 refills | Status: DC

## 2016-02-20 MED ORDER — COLCHICINE 0.6 MG PO CAPS: 0.6000 mg | ORAL_CAPSULE | Freq: Every day | ORAL | 0 refills | Status: DC

## 2016-02-20 MED ORDER — PREDNISONE 20 MG PO TABS: 20.0000 mg | ORAL_TABLET | Freq: Two times a day (BID) | ORAL | 0 refills | Status: DC

## 2016-02-20 NOTE — Progress Notes (Signed)
Subjective:    Patient ID: Isaac Phelps, male    DOB: 1930/06/15, 82 y.o.   MRN: [id number]  HPI 82 year old patient who is seen today with suspected recurrent gouty arthritis involving his right wrist.  He has been treated with 2 brief courses of oral prednisone with nice response.  Hydrochlorothiazide has been discontinued.   After initial improvement, he was placed on Allopurinol 100 mg daily.  He presents today with recurrent pain and swelling involving the right wrist.  He states that he has been taking occasional Advil with some improvement.  He does have some significant chronic kidney disease.  Past Medical History:  Diagnosis Date  . Anxiety   . BENIGN PROSTATIC HYPERTROPHY 04/01/2008  . Coronary artery disease   . GERD (gastroesophageal reflux disease)   . HYPERLIPIDEMIA 05/26/2007  . HYPERTENSION 02/07/2007  . Syncope and collapse   . TOBACCO ABUSE 04/01/2008     Social History   Social History  . Marital status: Married    Spouse name: N/A  . Number of children: N/A  . Years of education: N/A   Occupational History  . Not on file.   Social History Main Topics  . Smoking status: Current Every Day Smoker    Packs/day: 0.30    Types: Cigarettes  . Smokeless tobacco: Never Used  . Alcohol use No  . Drug use: No  . Sexual activity: Not on file   Other Topics Concern  . Not on file   Social History Narrative  . No narrative on file    Past Surgical History:  Procedure Laterality Date  . APPENDECTOMY    . CORONARY ANGIOPLASTY    . KNEE ARTHROSCOPY    . TONSILLECTOMY    . TONSILLECTOMY      Family History  Problem Relation Age of Onset  . Arthritis Neg Hx     family  . Hypertension Neg Hx     family  . Stroke Neg Hx     family , 1st degree relative    Allergies  Allergen Reactions  . Sulfa Antibiotics     Current Outpatient Prescriptions on File Prior to Visit  Medication Sig Dispense Refill  . allopurinol (ZYLOPRIM) 100 MG tablet Take 1  tablet (100 mg total) by mouth daily. 30 tablet 6  . clobetasol ointment (TEMOVATE) AB-123456789 % Apply 1 application topically 2 (two) times daily.   0  . diphenhydrAMINE-zinc acetate (BENADRYL) cream Apply topically 3 (three) times daily as needed for itching. 28.4 g 0  . lisinopril (PRINIVIL,ZESTRIL) 20 MG tablet Take 1 tablet (20 mg total) by mouth daily. 90 tablet 0  . simvastatin (ZOCOR) 40 MG tablet take 1/2 tablet by mouth once daily 90 tablet 3   No current facility-administered medications on file prior to visit.     BP (!) 150/70 (BP Location: Left Arm, Patient Position: Sitting, Cuff Size: Normal)   Pulse 72   Temp 98.3 F (36.8 C) (Oral)   Resp 20   Ht 5\' 10"  (1.778 m)   Wt 180 lb 6.1 oz (81.8 kg)   SpO2 98%   BMI 25.88 kg/m     Review of Systems  Musculoskeletal: Positive for arthralgias.       Objective:   Physical Exam  Constitutional: He appears well-developed and well-nourished. No distress.  Musculoskeletal:  Significant soft tissue swelling, excessive warmth and tenderness involving the right wrist.  There is also some soft tissue swelling involving the dorsal aspect of the hand  Assessment & Plan:    Recurrent gouty arthritis.    Likely aggravated by initiation of uric acid lowering therapy with allopurinol.  Hydrochlorothiazide has been discontinued.  Consider substitution of losartan for lisinopril for its uricosuric effect.   In view of chronic kidney disease.  Will avoid anti-inflammatory medications.  In view of acute gout.  Will again retreat with 40 mg of prednisone daily in divided dosages for 7 days, which has been consistently effective.   Will check uric acid level and electrolytes today.   Will place on prophylactic dose of colchicine  temporarily until stable on   XOI.    Will recheck in 4 weeks and again repeat uric acid level.  If gout is stable, will consider increased dose of allopurinol at that time depending on level Dyslipidemia -  .  Continue simvastatin (Both simvastatin and atorvastatin have uricosuric properties- not class effect)  .  Information dispensed including  Dietary information and general information on gout  [name]

## 2016-02-20 NOTE — Patient Instructions (Addendum)
Low-Purine Diet Purines are compounds that affect the level of uric acid in your body. A low-purine diet is a diet that is low in purines. Eating a low-purine diet can prevent the level of uric acid in your body from getting too high and causing gout or kidney stones or both. WHAT DO I NEED TO KNOW ABOUT THIS DIET?  Choose low-purine foods. Examples of low-purine foods are listed in the next section.  Drink plenty of fluids, especially water. Fluids can help remove uric acid from your body. Try to drink 8-16 cups (1.9-3.8 L) a day.  Limit foods high in fat, especially saturated fat, as fat makes it harder for the body to get rid of uric acid. Foods high in saturated fat include pizza, cheese, ice cream, whole milk, fried foods, and gravies. Choose foods that are lower in fat and lean sources of protein. Use olive oil when cooking as it contains healthy fats that are not high in saturated fat.  Limit alcohol. Alcohol interferes with the elimination of uric acid from your body. If you are having a gout attack, avoid all alcohol.  Keep in mind that different people's bodies react differently to different foods. You will probably learn over time which foods do or do not affect you. If you discover that a food tends to cause your gout to flare up, avoid eating that food. You can more freely enjoy foods that do not cause problems. If you have any questions about a food item, talk to your dietitian or health care provider. WHICH FOODS ARE LOW, MODERATE, AND HIGH IN PURINES? The following is a list of foods that are low, moderate, and high in purines. You can eat any amount of the foods that are low in purines. You may be able to have small amounts of foods that are moderate in purines. Ask your health care provider how much of a food moderate in purines you can have. Avoid foods high in purines. Grains  Foods low in purines: Enriched white bread, pasta, rice, cake, cornbread, popcorn.  Foods moderate in  purines: Whole-grain breads and cereals, wheat germ, bran, oatmeal. Uncooked oatmeal. Dry wheat bran or wheat germ.  Foods high in purines: Pancakes, French toast, biscuits, muffins. Vegetables  Foods low in purines: All vegetables, except those that are moderate in purines.  Foods moderate in purines: Asparagus, cauliflower, spinach, mushrooms, green peas. Fruits  All fruits are low in purines. Meats and other Protein Foods  Foods low in purines: Eggs, nuts, peanut butter.  Foods moderate in purines: 80-90% lean beef, lamb, veal, pork, poultry, fish, eggs, peanut butter, nuts. Crab, lobster, oysters, and shrimp. Cooked dried beans, peas, and lentils.  Foods high in purines: Anchovies, sardines, herring, mussels, tuna, codfish, scallops, trout, and haddock. Bacon. Organ meats (such as liver or kidney). Tripe. Game meat. Goose. Sweetbreads. Dairy  All dairy foods are low in purines. Low-fat and fat-free dairy products are best because they are low in saturated fat. Beverages  Drinks low in purines: Water, carbonated beverages, tea, coffee, cocoa.  Drinks moderate in purines: Soft drinks and other drinks sweetened with high-fructose corn syrup. Juices. To find whether a food or drink is sweetened with high-fructose corn syrup, look at the ingredients list.  Drinks high in purines: Alcoholic beverages (such as beer). Condiments  Foods low in purines: Salt, herbs, olives, pickles, relishes, vinegar.  Foods moderate in purines: Butter, margarine, oils, mayonnaise. Fats and Oils  Foods low in purines: All types, except gravies   and sauces made with meat.  Foods high in purines: Gravies and sauces made with meat. Other Foods  Foods low in purines: Sugars, sweets, gelatin. Cake. Soups made without meat.  Foods moderate in purines: Meat-based or fish-based soups, broths, or bouillons. Foods and drinks sweetened with high-fructose corn syrup.  Foods high in purines: High-fat desserts  (such as ice cream, cookies, cakes, pies, doughnuts, and chocolate). Contact your dietitian for more information on foods that are not listed here.   This information is not intended to replace advice given to you by your health care provider. Make sure you discuss any questions you have with your health care provider.   Document Released: 09/15/2010 Document Revised: 05/26/2013 Document Reviewed: 04/27/2013 Elsevier Interactive Patient Education 2016 [place].  Gout Gout is when your joints become red, sore, and swell (inflamed). This is caused by the buildup of uric acid crystals in the joints. Uric acid is a chemical that is normally in the blood. If the level of uric acid gets too high in the blood, these crystals form in your joints and tissues. Over time, these crystals can form into masses near the joints and tissues. These masses can destroy bone and cause the bone to look misshapen (deformed). HOME CARE   Do not take aspirin for pain.  Only take medicine as told by your doctor.  Rest the joint as much as you can. When in bed, keep sheets and blankets off painful areas.  Keep the sore joints raised (elevated).  Put warm or cold packs on painful joints. Use of warm or cold packs depends on which works best for you.  Use crutches if the painful joint is in your leg.  Drink enough fluids to keep your pee (urine) clear or pale yellow. Limit alcohol, sugary drinks, and drinks with fructose in them.  Follow your diet instructions. Pay careful attention to how much protein you eat. Include fruits, vegetables, whole grains, and fat-free or low-fat milk products in your daily diet. Talk to your doctor or dietitian about the use of coffee, vitamin C, and cherries. These may help lower uric acid levels.  Keep a healthy body weight. GET HELP RIGHT AWAY IF:   You have watery poop (diarrhea), throw up (vomit), or have any side effects from medicines.  You do not feel better in 24  hours, or you are getting worse.  Your joint becomes suddenly more tender, and you have chills or a fever. MAKE SURE YOU:   Understand these instructions.  Will watch your condition.  Will get help right away if you are not doing well or get worse.   This information is not intended to replace advice given to you by your health care provider. Make sure you discuss any questions you have with your health care provider.   Document Released: 02/28/2008 Document Revised: 06/11/2014 Document Reviewed: 01/02/2012 Elsevier Interactive Patient Education [employer].

## 2016-02-21 LAB — BASIC METABOLIC PANEL
BUN: 25 mg/dL — AB (ref 6–23)
CALCIUM: 8.5 mg/dL (ref 8.4–10.5)
CO2: 29 meq/L (ref 19–32)
CREATININE: 1.77 mg/dL — AB (ref 0.40–1.50)
Chloride: 105 mEq/L (ref 96–112)
GFR: 47.48 mL/min — AB (ref >60.00)
GLUCOSE: 96 mg/dL (ref 70–99)
Potassium: 3.6 mEq/L (ref 3.5–5.1)
SODIUM: 141 meq/L (ref 135–145)

## 2016-02-21 LAB — URIC ACID: Uric Acid, Serum: 6.9 mg/dL (ref 4.0–7.8)

## 2016-03-10 ENCOUNTER — Other Ambulatory Visit: Payer: Self-pay | Admitting: Internal Medicine

## 2016-03-14 ENCOUNTER — Other Ambulatory Visit: Payer: Self-pay | Admitting: Internal Medicine

## 2016-03-21 ENCOUNTER — Encounter: Payer: Self-pay | Admitting: Internal Medicine

## 2016-03-21 ENCOUNTER — Ambulatory Visit (INDEPENDENT_AMBULATORY_CARE_PROVIDER_SITE_OTHER): Payer: Medicare Other | Admitting: Internal Medicine

## 2016-03-21 VITALS — BP 138/74 | HR 81 | Temp 98.7°F | Ht 70.0 in | Wt 183.6 lb

## 2016-03-21 DIAGNOSIS — N183 Chronic kidney disease, stage 3 unspecified: Secondary | ICD-10-CM

## 2016-03-21 DIAGNOSIS — E785 Hyperlipidemia, unspecified: Secondary | ICD-10-CM | POA: Diagnosis not present

## 2016-03-21 DIAGNOSIS — I10 Essential (primary) hypertension: Secondary | ICD-10-CM

## 2016-03-21 DIAGNOSIS — F17200 Nicotine dependence, unspecified, uncomplicated: Secondary | ICD-10-CM | POA: Diagnosis not present

## 2016-03-21 DIAGNOSIS — M109 Gout, unspecified: Secondary | ICD-10-CM | POA: Diagnosis not present

## 2016-03-21 MED ORDER — HYDROCODONE-ACETAMINOPHEN 5-325 MG PO TABS: 1.0000 | ORAL_TABLET | Freq: Four times a day (QID) | ORAL | 0 refills | Status: DC | PRN

## 2016-03-21 MED ORDER — COLCHICINE 0.6 MG PO CAPS: ORAL_CAPSULE | ORAL | 3 refills | Status: DC

## 2016-03-21 MED ORDER — LOSARTAN POTASSIUM 100 MG PO TABS: 100.0000 mg | ORAL_TABLET | Freq: Every day | ORAL | 3 refills | Status: DC

## 2016-03-21 NOTE — Patient Instructions (Signed)
Discontinue lisinopril Substitute losartan 100 mg daily Increase colchicine seen to twice daily dosing  In 4 weeks, if gout has resolved, increase allopurinol to 2 tablets daily  Avoid ibuprofen, Aleve and other anti-inflammatories due to effects on kidneys  Substitute hydrocodone every 6 hours as needed for pain control  Return in 6 weeks for follow-up

## 2016-03-21 NOTE — Progress Notes (Signed)
Pre visit review using our clinic review tool, if applicable. No additional management support is needed unless otherwise documented below in the visit note. 

## 2016-03-21 NOTE — Progress Notes (Signed)
Subjective:    Patient ID: Isaac Phelps, male    DOB: 1930-06-07, 82 y.o.   MRN: [id number]  HPI   02/20/16.  Recurrent gouty arthritis.    Likely aggravated by initiation of uric acid lowering therapy with allopurinol.  Hydrochlorothiazide has been discontinued.  Consider substitution of losartan for lisinopril for its uricosuric effect.   In view of chronic kidney disease.  Will avoid anti-inflammatory medications.  In view of acute gout.  Will again retreat with 40 mg of prednisone daily in divided dosages for 7 days, which has been consistently effective.   Will check uric acid level and electrolytes today.   Will place on prophylactic dose of colchicine  temporarily until stable on   XOI.    Will recheck in 4 weeks and again repeat uric acid level.  If gout is stable, will consider increased dose of allopurinol at that time depending on level Dyslipidemia - .  Continue simvastatin (Both simvastatin and atorvastatin have uricosuric properties- not class effect)  .  Information dispensed including  Dietary information and general information on gout   Patient seen today in follow-up.  1 month ago he was retreated with prednisone 20 mg twice a day for recurrent gouty arthritis involving the right wrist.  He has not responded and is also developed pain and swelling involving his right ankle.  Uric acid level last month improved to 6.9. He was placed on prophylactic colchicine 0.6 milligrams once daily.  It was anticipated that his gout would be stable today for escalation of XOI therapy.  Past Medical History:  Diagnosis Date  . Anxiety   . BENIGN PROSTATIC HYPERTROPHY 04/01/2008  . Coronary artery disease   . GERD (gastroesophageal reflux disease)   . HYPERLIPIDEMIA 05/26/2007  . HYPERTENSION 02/07/2007  . Syncope and collapse   . TOBACCO ABUSE 04/01/2008     Social History   Social History  . Marital status: Married    Spouse name: N/A  . Number of children: N/A  . Years of  education: N/A   Occupational History  . Not on file.   Social History Main Topics  . Smoking status: Current Every Day Smoker    Packs/day: 0.30    Types: Cigarettes  . Smokeless tobacco: Never Used  . Alcohol use No  . Drug use: No  . Sexual activity: Not on file   Other Topics Concern  . Not on file   Social History Narrative  . No narrative on file    Past Surgical History:  Procedure Laterality Date  . APPENDECTOMY    . CORONARY ANGIOPLASTY    . KNEE ARTHROSCOPY    . TONSILLECTOMY    . TONSILLECTOMY      Family History  Problem Relation Age of Onset  . Arthritis Neg Hx     family  . Hypertension Neg Hx     family  . Stroke Neg Hx     family , 1st degree relative    Allergies  Allergen Reactions  . Sulfa Antibiotics     Current Outpatient Prescriptions on File Prior to Visit  Medication Sig Dispense Refill  . allopurinol (ZYLOPRIM) 100 MG tablet Take 1 tablet (100 mg total) by mouth daily. 30 tablet 6  . clobetasol ointment (TEMOVATE) AB-123456789 % Apply 1 application topically 2 (two) times daily.   0  . Colchicine 0.6 MG CAPS Take 0.6 mg by mouth daily. 60 capsule 0  . diphenhydrAMINE-zinc acetate (BENADRYL) cream Apply topically 3 (  three) times daily as needed for itching. 28.4 g 0  . furosemide (LASIX) 40 MG tablet One tablet daily as needed for swelling of feet or ankles 30 tablet 3  . lisinopril (PRINIVIL,ZESTRIL) 20 MG tablet take 1 tablet by mouth once daily 90 tablet 1  . predniSONE (DELTASONE) 20 MG tablet Take 1 tablet (20 mg total) by mouth 2 (two) times daily with a meal. 12 tablet 0  . simvastatin (ZOCOR) 40 MG tablet take 1/2 tablet by mouth once daily 90 tablet 3   No current facility-administered medications on file prior to visit.     BP 138/74 (BP Location: Left Arm, Patient Position: Sitting, Cuff Size: Normal)   Pulse 81   Temp 98.7 F (37.1 C) (Oral)   Ht 5\' 10"  (1.778 m)   Wt 183 lb 9.6 oz (83.3 kg)   SpO2 98%   BMI 26.34 kg/m        Review of Systems  Musculoskeletal: Positive for arthralgias and joint swelling.       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  Will increase colchicine  to a twice a day regimen.  Will treat with  Vicodin analgesics.  In view of chronic kidney disease.  Anti-inflammatory drugs again discouraged.  If improved in 4 weeks,   The patient has been asked to increase allopurinol to 200 mg daily.  We'll discontinue lisinopril and substitute losartan 100  Return in 6 weeks for follow-up   [name]

## 2016-03-27 ENCOUNTER — Encounter: Payer: Self-pay | Admitting: Internal Medicine

## 2016-03-27 ENCOUNTER — Ambulatory Visit (INDEPENDENT_AMBULATORY_CARE_PROVIDER_SITE_OTHER): Payer: Medicare Other | Admitting: Internal Medicine

## 2016-03-27 VITALS — BP 160/76 | HR 78 | Temp 98.6°F | Resp 20 | Ht 70.0 in | Wt 178.2 lb

## 2016-03-27 DIAGNOSIS — M109 Gout, unspecified: Secondary | ICD-10-CM

## 2016-03-27 DIAGNOSIS — I10 Essential (primary) hypertension: Secondary | ICD-10-CM

## 2016-03-27 MED ORDER — ALLOPURINOL 100 MG PO TABS: 200.0000 mg | ORAL_TABLET | Freq: Every day | ORAL | 6 refills | Status: DC

## 2016-03-27 NOTE — Progress Notes (Signed)
Subjective:    Patient ID: Isaac Phelps, male    DOB: 10-Apr-1931, 82 y.o.   MRN: [id number]  HPI 82 year old patient who is seen today for follow-up of gout involving his right ankle.  This continues to improve.  He has been on a prophylactic dose of colchicine since being placed on allopurinol.  Presently 100 mg daily.  Past Medical History:  Diagnosis Date  . Anxiety   . BENIGN PROSTATIC HYPERTROPHY 04/01/2008  . Coronary artery disease   . GERD (gastroesophageal reflux disease)   . HYPERLIPIDEMIA 05/26/2007  . HYPERTENSION 02/07/2007  . Syncope and collapse   . TOBACCO ABUSE 04/01/2008     Social History   Social History  . Marital status: Married    Spouse name: N/A  . Number of children: N/A  . Years of education: N/A   Occupational History  . Not on file.   Social History Main Topics  . Smoking status: Current Every Day Smoker    Packs/day: 0.30    Types: Cigarettes  . Smokeless tobacco: Never Used  . Alcohol use No  . Drug use: No  . Sexual activity: Not on file   Other Topics Concern  . Not on file   Social History Narrative  . No narrative on file    Past Surgical History:  Procedure Laterality Date  . APPENDECTOMY    . CORONARY ANGIOPLASTY    . KNEE ARTHROSCOPY    . TONSILLECTOMY    . TONSILLECTOMY      Family History  Problem Relation Age of Onset  . Arthritis Neg Hx     family  . Hypertension Neg Hx     family  . Stroke Neg Hx     family , 1st degree relative    Allergies  Allergen Reactions  . Sulfa Antibiotics     Current Outpatient Prescriptions on File Prior to Visit  Medication Sig Dispense Refill  . clobetasol ointment (TEMOVATE) AB-123456789 % Apply 1 application topically 2 (two) times daily.   0  . Colchicine 0.6 MG CAPS 1 tablet twice daily 60 capsule 3  . diphenhydrAMINE-zinc acetate (BENADRYL) cream Apply topically 3 (three) times daily as needed for itching. 28.4 g 0  . furosemide (LASIX) 40 MG tablet One tablet daily as  needed for swelling of feet or ankles 30 tablet 3  . HYDROcodone-acetaminophen (NORCO/VICODIN) 5-325 MG tablet Take 1 tablet by mouth every 6 (six) hours as needed for moderate pain. 30 tablet 0  . losartan (COZAAR) 100 MG tablet Take 1 tablet (100 mg total) by mouth daily. 90 tablet 3  . simvastatin (ZOCOR) 40 MG tablet take 1/2 tablet by mouth once daily 90 tablet 3   No current facility-administered medications on file prior to visit.     BP (!) 160/76 (BP Location: Left Arm, Patient Position: Sitting, Cuff Size: Normal)   Pulse 78   Temp 98.6 F (37 C) (Oral)   Resp 20   Ht 5\' 10"  (1.778 m)   Wt 178 lb 4 oz (80.9 kg)   SpO2 98%   BMI 25.58 kg/m      Review of Systems  Musculoskeletal: Positive for arthralgias and joint swelling.       Objective:   Physical Exam  Constitutional: He appears well-developed and well-nourished. No distress.  Blood pressure 140/72  Musculoskeletal:  Minimal residual soft tissue swelling about the right ankle.  No pain          Assessment & Plan:  Resolving gouty arthritis.  Will increase allopurinol to total of 200 mg daily.  Continue colchicine prophylaxis  Recheck next month or as needed  Note to return to work dictated  [employer]

## 2016-03-27 NOTE — Patient Instructions (Addendum)
Increase allopurinol to 2 tablets (200 mg) once daily  Call or return to clinic prn if these symptoms worsen or fail to improve as anticipated.

## 2016-04-11 ENCOUNTER — Ambulatory Visit: Payer: Medicare Other | Admitting: Internal Medicine

## 2016-04-12 ENCOUNTER — Ambulatory Visit (INDEPENDENT_AMBULATORY_CARE_PROVIDER_SITE_OTHER): Payer: Medicare Other | Admitting: Internal Medicine

## 2016-04-12 ENCOUNTER — Encounter: Payer: Self-pay | Admitting: Internal Medicine

## 2016-04-12 VITALS — BP 160/66 | HR 92 | Temp 98.4°F | Resp 20 | Ht 70.0 in | Wt 176.5 lb

## 2016-04-12 DIAGNOSIS — I10 Essential (primary) hypertension: Secondary | ICD-10-CM | POA: Diagnosis not present

## 2016-04-12 DIAGNOSIS — N183 Chronic kidney disease, stage 3 unspecified: Secondary | ICD-10-CM

## 2016-04-12 DIAGNOSIS — M109 Gout, unspecified: Secondary | ICD-10-CM | POA: Diagnosis not present

## 2016-04-12 MED ORDER — COLCHICINE 0.6 MG PO CAPS: ORAL_CAPSULE | ORAL | 3 refills | Status: DC

## 2016-04-12 MED ORDER — HYDROCODONE-ACETAMINOPHEN 5-325 MG PO TABS: 1.0000 | ORAL_TABLET | Freq: Four times a day (QID) | ORAL | 0 refills | Status: DC | PRN

## 2016-04-12 NOTE — Progress Notes (Signed)
Subjective:    Patient ID: Isaac Phelps, male    DOB: June 24, 1930, 82 y.o.   MRN: [id number]  HPI  82 year old patient who has been treated for recurrent gouty arthritis.  Patient was seen approximately 2 weeks ago and allopurinol therapy uptitrated to 200 mg daily.  He was maintained on colchicine prophylaxis during the dose escalation.  He continues to take this until he ran out of his present supply.  Over the past few days he's had increasing swelling involving the right ankle and foot but only mild discomfort with ambulation. He has treated hypertension and chronic kidney disease  Due to hyperuricemia and suspected recurrent gouty arthritis, he is now on losartan for blood pressure control as well as furosemide.  Hydrochlorothiazide has been discontinued  He has been treated for acute gout with prednisone.  Anti-inflammatory medications have been avoided due to significant chronic kidney disease  Past Medical History:  Diagnosis Date  . Anxiety   . BENIGN PROSTATIC HYPERTROPHY 04/01/2008  . Coronary artery disease   . GERD (gastroesophageal reflux disease)   . HYPERLIPIDEMIA 05/26/2007  . HYPERTENSION 02/07/2007  . Syncope and collapse   . TOBACCO ABUSE 04/01/2008     Social History   Social History  . Marital status: Married    Spouse name: N/A  . Number of children: N/A  . Years of education: N/A   Occupational History  . Not on file.   Social History Main Topics  . Smoking status: Current Every Day Smoker    Packs/day: 0.30    Types: Cigarettes  . Smokeless tobacco: Never Used  . Alcohol use No  . Drug use: No  . Sexual activity: Not on file   Other Topics Concern  . Not on file   Social History Narrative  . No narrative on file    Past Surgical History:  Procedure Laterality Date  . APPENDECTOMY    . CORONARY ANGIOPLASTY    . KNEE ARTHROSCOPY    . TONSILLECTOMY    . TONSILLECTOMY      Family History  Problem Relation Age of Onset  . Arthritis  Neg Hx     family  . Hypertension Neg Hx     family  . Stroke Neg Hx     family , 1st degree relative    Allergies  Allergen Reactions  . Sulfa Antibiotics     Current Outpatient Prescriptions on File Prior to Visit  Medication Sig Dispense Refill  . allopurinol (ZYLOPRIM) 100 MG tablet Take 2 tablets (200 mg total) by mouth daily. 180 tablet 6  . clobetasol ointment (TEMOVATE) AB-123456789 % Apply 1 application topically 2 (two) times daily.   0  . diphenhydrAMINE-zinc acetate (BENADRYL) cream Apply topically 3 (three) times daily as needed for itching. 28.4 g 0  . furosemide (LASIX) 40 MG tablet One tablet daily as needed for swelling of feet or ankles 30 tablet 3  . losartan (COZAAR) 100 MG tablet Take 1 tablet (100 mg total) by mouth daily. 90 tablet 3  . simvastatin (ZOCOR) 40 MG tablet take 1/2 tablet by mouth once daily 90 tablet 3  . Colchicine 0.6 MG CAPS 1 tablet twice daily (Patient not taking: Reported on 04/12/2016) 60 capsule 3  . HYDROcodone-acetaminophen (NORCO/VICODIN) 5-325 MG tablet Take 1 tablet by mouth every 6 (six) hours as needed for moderate pain. (Patient not taking: Reported on 04/12/2016) 30 tablet 0   No current facility-administered medications on file prior to visit.  BP (!) 160/66 (BP Location: Left Arm, Patient Position: Sitting, Cuff Size: Normal)   Pulse 92   Temp 98.4 F (36.9 C) (Oral)   Resp 20   Ht 5\' 10"  (1.778 m)   Wt 176 lb 8 oz (80.1 kg)   SpO2 98%   BMI 25.33 kg/m     Review of Systems  Musculoskeletal: Positive for arthralgias, gait problem and joint swelling.       Objective:   Physical Exam  Constitutional: He appears well-developed and well-nourished. No distress.  Musculoskeletal:  Prominent right ankle and pedal edema No significant tenderness or erythema Ambulatory with minimal discomfort          Assessment & Plan:   History of gouty arthritis Right ankle and pedal edema.  Unclear whether this represents gouty  arthritis.  Will check a uric acid level. Refill colchicine  to take twice a day when necessary acute gout  Continue low purine diet Continue furosemide when necessary  [name]

## 2016-04-12 NOTE — Patient Instructions (Signed)
Limit your sodium (Salt) intake  Please check your blood pressure on a regular basis.  If it is consistently greater than 150/90, please make an office appointment.  Keep right leg elevated as much as possible  Increased furosemide to twice daily until swelling of the right foot and ankle, improved  Use Colchicine twice daily as needed for gouty arthritis pain

## 2016-04-13 ENCOUNTER — Encounter: Payer: Self-pay | Admitting: *Deleted

## 2016-05-02 ENCOUNTER — Ambulatory Visit: Payer: Medicare Other | Admitting: Internal Medicine

## 2016-05-24 ENCOUNTER — Ambulatory Visit (INDEPENDENT_AMBULATORY_CARE_PROVIDER_SITE_OTHER): Payer: Medicare Other | Admitting: Family Medicine

## 2016-05-24 ENCOUNTER — Encounter: Payer: Self-pay | Admitting: Family Medicine

## 2016-05-24 VITALS — BP 152/72 | HR 90 | Temp 98.4°F | Wt 185.0 lb

## 2016-05-24 DIAGNOSIS — M109 Gout, unspecified: Secondary | ICD-10-CM | POA: Diagnosis not present

## 2016-05-24 DIAGNOSIS — N183 Chronic kidney disease, stage 3 unspecified: Secondary | ICD-10-CM

## 2016-05-24 MED ORDER — HYDROCODONE-ACETAMINOPHEN 5-325 MG PO TABS: 1.0000 | ORAL_TABLET | Freq: Four times a day (QID) | ORAL | 0 refills | Status: DC | PRN

## 2016-05-24 MED ORDER — FUROSEMIDE 40 MG PO TABS: ORAL_TABLET | ORAL | 3 refills | Status: DC

## 2016-05-24 MED ORDER — COLCHICINE 0.6 MG PO CAPS: ORAL_CAPSULE | ORAL | 3 refills | Status: DC

## 2016-05-24 NOTE — Progress Notes (Signed)
Subjective:    Patient ID: Isaac Phelps, male    DOB: 01-25-1931, 82 y.o.   MRN: [id number] e HPI  Isaac Phelps is an 82 year old male who presents today with recurrent gouty arthritis that presented 1 week ago.  Patient reports that he will pick up 10 tablets of colchicine at a time due cost when exacerbations occur. Last exacerbation 04/2016. He reports allopurinol use daily.  Associated symptom of swelling in both ankles but is predominantly in his right ankle with minimal pain with ambulation. He denies fever, chills, sweats, and N/V/D, SOB, chest pain, palpitations, nosebleeds or headache.  He has a history of HTN and CKD. No treatment has been tried at home. No aggravating or alleviating factors ntoed.  He has a history of hyperuricemia. Lab Results  Component Value Date   LABURIC 6.9 02/20/2016   No treatments have been tried at home.   Review of Systems  Respiratory: Negative for cough and wheezing.   Cardiovascular: Negative for chest pain and palpitations.  Musculoskeletal: Positive for arthralgias and joint swelling.   Past Medical History:  Diagnosis Date  . Anxiety   . BENIGN PROSTATIC HYPERTROPHY 04/01/2008  . Coronary artery disease   . GERD (gastroesophageal reflux disease)   . HYPERLIPIDEMIA 05/26/2007  . HYPERTENSION 02/07/2007  . Syncope and collapse   . TOBACCO ABUSE 04/01/2008     Social History   Social History  . Marital status: Married    Spouse name: N/A  . Number of children: N/A  . Years of education: N/A   Occupational History  . Not on file.   Social History Main Topics  . Smoking status: Current Every Day Smoker    Packs/day: 0.30    Types: Cigarettes  . Smokeless tobacco: Never Used  . Alcohol use No  . Drug use: No  . Sexual activity: Not on file   Other Topics Concern  . Not on file   Social History Narrative  . No narrative on file    Past Surgical History:  Procedure Laterality Date  . APPENDECTOMY    . CORONARY  ANGIOPLASTY    . KNEE ARTHROSCOPY    . TONSILLECTOMY    . TONSILLECTOMY      Family History  Problem Relation Age of Onset  . Arthritis Neg Hx     family  . Hypertension Neg Hx     family  . Stroke Neg Hx     family , 1st degree relative    Allergies  Allergen Reactions  . Sulfa Antibiotics     Current Outpatient Prescriptions on File Prior to Visit  Medication Sig Dispense Refill  . allopurinol (ZYLOPRIM) 100 MG tablet Take 2 tablets (200 mg total) by mouth daily. 180 tablet 6  . clobetasol ointment (TEMOVATE) AB-123456789 % Apply 1 application topically 2 (two) times daily.   0  . diphenhydrAMINE-zinc acetate (BENADRYL) cream Apply topically 3 (three) times daily as needed for itching. 28.4 g 0  . losartan (COZAAR) 100 MG tablet Take 1 tablet (100 mg total) by mouth daily. 90 tablet 3  . simvastatin (ZOCOR) 40 MG tablet take 1/2 tablet by mouth once daily 90 tablet 3   No current facility-administered medications on file prior to visit.     BP (!) 152/72   Pulse 90   Temp 98.4 F (36.9 C) (Oral)   Wt 185 lb (83.9 kg)   SpO2 98%   BMI 26.54 kg/m  Objective:   Physical Exam  Constitutional: He is oriented to person, place, and time. He appears well-developed and well-nourished.  Cardiovascular: Normal rate and regular rhythm.   Pulmonary/Chest: Effort normal and breath sounds normal. He has no wheezes. He has no rales.  Musculoskeletal: He exhibits edema.  Prominent edema in ankles bilaterally; right > left. Pedal edema present. No significant tenderness or erythema. Minimal pain with ambulation    Neurological: He is alert and oriented to person, place, and time.  Skin: Skin is warm and dry.       Assessment & Plan:  1. Gouty arthritis Advised elevation of legs as much as possible. Also, short term use of furosemide until swelling of both ankles has improved which was previously prescribed by his provider. Advised use of colchicine twice daily as needed for  gouty arthritis as prescribed by his provider and provided limited refill of Norco for pain during this time.  Advised follow up with PCP in 2 weeks for further evaluation and treatment or sooner if needed. Advised low purine diet and avoidance of triggers.  2. Stage 3 chronic kidney disease Avoided use of antiinflammatories due to CKD.  Delano Metz, FNP-C

## 2016-05-24 NOTE — Progress Notes (Signed)
Pre visit review using our clinic review tool, if applicable. No additional management support is needed unless otherwise documented below in the visit note. 

## 2016-05-24 NOTE — Patient Instructions (Signed)
Please elevate legs as much as possible. Take furosemide daily until swelling of feet and ankles has improved Gout Gout is painful swelling that can occur in some of your joints. Gout is a type of arthritis. This condition is caused by having too much uric acid in your body. Uric acid is a chemical that forms when your body breaks down substances called purines. Purines are important for building body proteins. When your body has too much uric acid, sharp crystals can form and build up inside your joints. This causes pain and swelling. Gout attacks can happen quickly and be very painful (acute gout). Over time, the attacks can affect more joints and become more frequent (chronic gout). Gout can also cause uric acid to build up under your skin and inside your kidneys. What are the causes? This condition is caused by too much uric acid in your blood. This can occur because:  Your kidneys do not remove enough uric acid from your blood. This is the most common cause.  Your body makes too much uric acid. This can occur with some cancers and cancer treatments. It can also occur if your body is breaking down too many red blood cells (hemolytic anemia).  You eat too many foods that are high in purines. These foods include organ meats and some seafood. Alcohol, especially beer, is also high in purines. A gout attack may be triggered by trauma or stress. What increases the risk? This condition is more likely to develop in people who:  Have a family history of gout.  Are male and middle-aged.  Are male and have gone through menopause.  Are obese.  Frequently drink alcohol, especially beer.  Are dehydrated.  Lose weight too quickly.  Have an organ transplant.  Have lead poisoning.  Take certain medicines, including aspirin, cyclosporine, diuretics, levodopa, and niacin.  Have kidney disease or psoriasis. What are the signs or symptoms? An attack of acute gout happens quickly. It usually  occurs in just one joint. The most common place is the big toe. Attacks often start at night. Other joints that may be affected include joints of the feet, ankle, knee, fingers, wrist, or elbow. Symptoms may include:  Severe pain.  Warmth.  Swelling.  Stiffness.  Tenderness. The affected joint may be very painful to touch.  Shiny, red, or purple skin.  Chills and fever. Chronic gout may cause symptoms more frequently. More joints may be involved. You may also have white or yellow lumps (tophi) on your hands or feet or in other areas near your joints. How is this diagnosed? This condition is diagnosed based on your symptoms, medical history, and physical exam. You may have tests, such as:  Blood tests to measure uric acid levels.  Removal of joint fluid with a needle (aspiration) to look for uric acid crystals.  X-rays to look for joint damage. How is this treated? Treatment for this condition has two phases: treating an acute attack and preventing future attacks. Acute gout treatment may include medicines to reduce pain and swelling, including:  NSAIDs.  Steroids. These are strong anti-inflammatory medicines that can be taken by mouth (orally) or injected into a joint.  Colchicine. This medicine relieves pain and swelling when it is taken soon after an attack. It can be given orally or through an IV tube. Preventive treatment may include:  Daily use of smaller doses of NSAIDs or colchicine.  Use of a medicine that reduces uric acid levels in your blood.  Changes to  your diet. You may need to see a specialist about healthy eating (dietitian). Follow these instructions at home: During a Gout Attack  If directed, apply ice to the affected area:  Put ice in a plastic bag.  Place a towel between your skin and the bag.  Leave the ice on for 20 minutes, 2-3 times a day.  Rest the joint as much as possible. If the affected joint is in your leg, you may be given crutches to  use.  Raise (elevate) the affected joint above the level of your heart as often as possible.  Drink enough fluids to keep your urine clear or pale yellow.  Take over-the-counter and prescription medicines only as told by your health care provider.  Do not drive or operate heavy machinery while taking prescription pain medicine.  Follow instructions from your health care provider about eating or drinking restrictions.  Return to your normal activities as told by your health care provider. Ask your health care provider what activities are safe for you. Avoiding Future Gout Attacks  Follow a low-purine diet as told by your dietitian or health care provider. Avoid foods and drinks that are high in purines, including liver, kidney, anchovies, asparagus, herring, mushrooms, mussels, and beer.  Limit alcohol intake to no more than 1 drink a day for nonpregnant women and 2 drinks a day for men. One drink equals 12 oz of beer, 5 oz of wine, or 1 oz of hard liquor.  Maintain a healthy weight or lose weight if you are overweight. If you want to lose weight, talk with your health care provider. It is important that you do not lose weight too quickly.  Start or maintain an exercise program as told by your health care provider.  Drink enough fluids to keep your urine clear or pale yellow.  Take over-the-counter and prescription medicines only as told by your health care provider.  Keep all follow-up visits as told by your health care provider. This is important. Contact a health care provider if:  You have another gout attack.  You continue to have symptoms of a gout attack after10 days of treatment.  You have side effects from your medicines.  You have chills or a fever.  You have burning pain when you urinate.  You have pain in your lower back or belly. Get help right away if:  You have severe or uncontrolled pain.  You cannot urinate. This information is not intended to replace  advice given to you by your health care provider. Make sure you discuss any questions you have with your health care provider. Document Released: 05/18/2000 Document Revised: 10/27/2015 Document Reviewed: 03/03/2015 Elsevier Interactive Patient Education  2017 [place]. .  Use colchicine twice daily as needed for gouty arthritis pain. Follow up with your provider in 2 weeks for further evaluation and treatment or sooner if symptoms do not improve with treatment.

## 2016-07-05 ENCOUNTER — Other Ambulatory Visit: Payer: Self-pay | Admitting: Family Medicine

## 2016-08-02 ENCOUNTER — Other Ambulatory Visit: Payer: Self-pay | Admitting: Family Medicine

## 2016-08-14 ENCOUNTER — Encounter: Payer: Medicare Other | Admitting: Internal Medicine

## 2017-01-15 ENCOUNTER — Encounter: Payer: Medicare Other | Admitting: Internal Medicine

## 2017-02-18 ENCOUNTER — Ambulatory Visit (INDEPENDENT_AMBULATORY_CARE_PROVIDER_SITE_OTHER): Payer: Medicare Other | Admitting: Internal Medicine

## 2017-02-18 ENCOUNTER — Encounter: Payer: Self-pay | Admitting: Internal Medicine

## 2017-02-18 VITALS — BP 178/84 | HR 76 | Temp 98.2°F | Ht 70.0 in | Wt 180.2 lb

## 2017-02-18 DIAGNOSIS — M109 Gout, unspecified: Secondary | ICD-10-CM | POA: Diagnosis not present

## 2017-02-18 DIAGNOSIS — N183 Chronic kidney disease, stage 3 unspecified: Secondary | ICD-10-CM

## 2017-02-18 DIAGNOSIS — D649 Anemia, unspecified: Secondary | ICD-10-CM | POA: Diagnosis not present

## 2017-02-18 DIAGNOSIS — E785 Hyperlipidemia, unspecified: Secondary | ICD-10-CM | POA: Diagnosis not present

## 2017-02-18 DIAGNOSIS — I10 Essential (primary) hypertension: Secondary | ICD-10-CM

## 2017-02-18 LAB — CBC WITH DIFFERENTIAL/PLATELET
BASOS ABS: 0.1 10*3/uL (ref 0.0–0.1)
Basophils Relative: 1.2 % (ref 0.0–3.0)
Eosinophils Absolute: 0.2 10*3/uL (ref 0.0–0.7)
Eosinophils Relative: 2.7 % (ref 0.0–5.0)
HEMATOCRIT: 31.7 % — AB (ref 39.0–52.0)
HEMOGLOBIN: 10.2 g/dL — AB (ref 13.0–17.0)
LYMPHS PCT: 14.8 % (ref 12.0–46.0)
Lymphs Abs: 0.9 10*3/uL (ref 0.7–4.0)
MCHC: 32.1 g/dL (ref 30.0–36.0)
MCV: 80.1 fl (ref 78.0–100.0)
MONOS PCT: 7.1 % (ref 3.0–12.0)
Monocytes Absolute: 0.4 10*3/uL (ref 0.1–1.0)
NEUTROS ABS: 4.4 10*3/uL (ref 1.4–7.7)
Neutrophils Relative %: 74.2 % (ref 43.0–77.0)
PLATELETS: 189 10*3/uL (ref 150.0–400.0)
RBC: 3.96 Mil/uL — AB (ref 4.22–5.81)
RDW: 16.4 % — ABNORMAL HIGH (ref 11.5–15.5)
WBC: 5.9 10*3/uL (ref 4.0–10.5)

## 2017-02-18 LAB — COMPREHENSIVE METABOLIC PANEL
ALK PHOS: 99 U/L (ref 39–117)
ALT: 18 U/L (ref 0–53)
AST: 39 U/L — ABNORMAL HIGH (ref 0–37)
Albumin: 4 g/dL (ref 3.5–5.2)
BUN: 26 mg/dL — ABNORMAL HIGH (ref 6–23)
CO2: 25 meq/L (ref 19–32)
Calcium: 9.3 mg/dL (ref 8.4–10.5)
Chloride: 108 mEq/L (ref 96–112)
Creatinine, Ser: 1.64 mg/dL — ABNORMAL HIGH (ref 0.40–1.50)
GFR: 51.72 mL/min — AB (ref >60.00)
GLUCOSE: 94 mg/dL (ref 70–99)
POTASSIUM: 3.9 meq/L (ref 3.5–5.1)
Sodium: 140 mEq/L (ref 135–145)
Total Bilirubin: 0.4 mg/dL (ref 0.2–1.2)
Total Protein: 7 g/dL (ref 6.0–8.3)

## 2017-02-18 LAB — LIPID PANEL
CHOL/HDL RATIO: 3
Cholesterol: 142 mg/dL (ref 0–200)
HDL: 46.1 mg/dL (ref >39.00)
LDL Cholesterol: 84 mg/dL (ref 0–99)
NonHDL: 95.72
Triglycerides: 59 mg/dL (ref 0.0–149.0)
VLDL: 11.8 mg/dL (ref 0.0–40.0)

## 2017-02-18 LAB — TSH: TSH: 1.74 u[IU]/mL (ref 0.35–4.50)

## 2017-02-18 LAB — URIC ACID: Uric Acid, Serum: 4.4 mg/dL (ref 4.0–7.8)

## 2017-02-18 MED ORDER — FUROSEMIDE 40 MG PO TABS
ORAL_TABLET | ORAL | 3 refills | Status: DC
Start: 2017-02-18 — End: 2018-01-06

## 2017-02-18 MED ORDER — LOSARTAN POTASSIUM 100 MG PO TABS: 100.0000 mg | ORAL_TABLET | Freq: Every day | ORAL | 3 refills | Status: DC

## 2017-02-18 MED ORDER — ALLOPURINOL 100 MG PO TABS
200.0000 mg | ORAL_TABLET | Freq: Every day | ORAL | 6 refills | Status: DC
Start: 2017-02-18 — End: 2018-01-06

## 2017-02-18 MED ORDER — SIMVASTATIN 40 MG PO TABS: 20.0000 mg | ORAL_TABLET | Freq: Every day | ORAL | 3 refills | Status: DC

## 2017-02-18 NOTE — Patient Instructions (Addendum)
Limit your sodium (Salt) intake  Please check your blood pressure on a regular basis.  If it is consistently greater than 150/90, please make an office appointment.  Smoking tobacco is very bad for your health. You should stop smoking immediately.   Low-Sodium Eating Plan Sodium, which is an element that makes up salt, helps you maintain a healthy balance of fluids in your body. Too much sodium can increase your blood pressure and cause fluid and waste to be held in your body. Your health care provider or dietitian may recommend following this plan if you have high blood pressure (hypertension), kidney disease, liver disease, or heart failure. Eating less sodium can help lower your blood pressure, reduce swelling, and protect your heart, liver, and kidneys. What are tips for following this plan? General guidelines  Most people on this plan should limit their sodium intake to 1,500-2,000 mg (milligrams) of sodium each day. Reading food labels  The Nutrition Facts label lists the amount of sodium in one serving of the food. If you eat more than one serving, you must multiply the listed amount of sodium by the number of servings.  Choose foods with less than 140 mg of sodium per serving.  Avoid foods with 300 mg of sodium or more per serving. Shopping  Look for lower-sodium products, often labeled as "low-sodium" or "no salt added."  Always check the sodium content even if foods are labeled as "unsalted" or "no salt added".  Buy fresh foods. ? Avoid canned foods and premade or frozen meals. ? Avoid canned, cured, or processed meats  Buy breads that have less than 80 mg of sodium per slice. Cooking  Eat more home-cooked food and less restaurant, buffet, and fast food.  Avoid adding salt when cooking. Use salt-free seasonings or herbs instead of table salt or sea salt. Check with your health care provider or pharmacist before using salt substitutes.  Cook with plant-based oils, such  as canola, sunflower, or olive oil. Meal planning  When eating at a restaurant, ask that your food be prepared with less salt or no salt, if possible.  Avoid foods that contain MSG (monosodium glutamate). MSG is sometimes added to [place] food, bouillon, and some canned foods. What foods are recommended? The items listed may not be a complete list. Talk with your dietitian about what dietary choices are best for you. Grains Low-sodium cereals, including oats, puffed wheat and rice, and shredded wheat. Low-sodium crackers. Unsalted rice. Unsalted pasta. Low-sodium bread. Whole-grain breads and whole-grain pasta. Vegetables Fresh or frozen vegetables. "No salt added" canned vegetables. "No salt added" tomato sauce and paste. Low-sodium or reduced-sodium tomato and vegetable juice. Fruits Fresh, frozen, or canned fruit. Fruit juice. Meats and other protein foods Fresh or frozen (no salt added) meat, poultry, seafood, and fish. Low-sodium canned tuna and salmon. Unsalted nuts. Dried peas, beans, and lentils without added salt. Unsalted canned beans. Eggs. Unsalted nut butters. Dairy Milk. Soy milk. Cheese that is naturally low in sodium, such as ricotta cheese, fresh mozzarella, or Swiss cheese Low-sodium or reduced-sodium cheese. Cream cheese. Yogurt. Fats and oils Unsalted butter. Unsalted margarine with no trans fat. Vegetable oils such as canola or olive oils. Seasonings and other foods Fresh and dried herbs and spices. Salt-free seasonings. Low-sodium mustard and ketchup. Sodium-free salad dressing. Sodium-free light mayonnaise. Fresh or refrigerated horseradish. Lemon juice. Vinegar. Homemade, reduced-sodium, or low-sodium soups. Unsalted popcorn and pretzels. Low-salt or salt-free chips. What foods are not recommended? The items listed may not  be a complete list. Talk with your dietitian about what dietary choices are best for you. Grains Instant hot cereals. Bread stuffing, pancake, and  biscuit mixes. Croutons. Seasoned rice or pasta mixes. Noodle soup cups. Boxed or frozen macaroni and cheese. Regular salted crackers. Self-rising flour. Vegetables Sauerkraut, pickled vegetables, and relishes. Olives. [place] fries. Onion rings. Regular canned vegetables (not low-sodium or reduced-sodium). Regular canned tomato sauce and paste (not low-sodium or reduced-sodium). Regular tomato and vegetable juice (not low-sodium or reduced-sodium). Frozen vegetables in sauces. Meats and other protein foods Meat or fish that is salted, canned, smoked, spiced, or pickled. Bacon, ham, sausage, hotdogs, corned beef, chipped beef, packaged lunch meats, salt pork, jerky, pickled herring, anchovies, regular canned tuna, sardines, salted nuts. Dairy Processed cheese and cheese spreads. Cheese curds. Blue cheese. Feta cheese. String cheese. Regular cottage cheese. Buttermilk. Canned milk. Fats and oils Salted butter. Regular margarine. Ghee. Bacon fat. Seasonings and other foods Onion salt, garlic salt, seasoned salt, table salt, and sea salt. Canned and packaged gravies. Worcestershire sauce. Tartar sauce. Barbecue sauce. Teriyaki sauce. Soy sauce, including reduced-sodium. Steak sauce. Fish sauce. Oyster sauce. Cocktail sauce. Horseradish that you find on the shelf. Regular ketchup and mustard. Meat flavorings and tenderizers. Bouillon cubes. Hot sauce and Tabasco sauce. Premade or packaged marinades. Premade or packaged taco seasonings. Relishes. Regular salad dressings. Salsa. Potato and tortilla chips. Corn chips and puffs. Salted popcorn and pretzels. Canned or dried soups. Pizza. Frozen entrees and pot pies. Summary  Eating less sodium can help lower your blood pressure, reduce swelling, and protect your heart, liver, and kidneys.  Most people on this plan should limit their sodium intake to 1,500-2,000 mg (milligrams) of sodium each day.  Canned, boxed, and frozen foods are high in sodium. Restaurant  foods, fast foods, and pizza are also very high in sodium. You also get sodium by adding salt to food.  Try to cook at home, eat more fresh fruits and vegetables, and eat less fast food, canned, processed, or prepared foods. This information is not intended to replace advice given to you by your health care provider. Make sure you discuss any questions you have with your health care provider. Document Released: 11/10/2001 Document Revised: 05/14/2016 Document Reviewed: 05/14/2016 Elsevier Interactive Patient Education  2017 [employer]  Chronic Venous Insufficiency Chronic venous insufficiency, also called venous stasis, is a condition that prevents blood from being pumped effectively through the veins in your legs. Blood may no longer be pumped effectively from the legs back to the heart. This condition can range from mild to severe. With proper treatment, you should be able to continue with an active life. What are the causes? Chronic venous insufficiency occurs when the vein walls become stretched, weakened, or damaged, or when valves within the vein are damaged. Some common causes of this include:  High blood pressure inside the veins (venous hypertension).  Increased blood pressure in the leg veins from long periods of sitting or standing.  A blood clot that blocks blood flow in a vein (deep vein thrombosis, DVT).  Inflammation of a vein (phlebitis) that causes a blood clot to form.  Tumors in the pelvis that cause blood to back up.  What increases the risk? The following factors may make you more likely to develop this condition:  Having a family history of this condition.  Obesity.  Pregnancy.  Living without enough physical activity or exercise (sedentary lifestyle).  Smoking.  Having a job that requires long periods of standing  or sitting in one place.  Being a certain age. Women in their 76s and 75s and men in their 63s are more likely to develop this  condition.  What are the signs or symptoms? Symptoms of this condition include:  Veins that are enlarged, bulging, or twisted (varicose veins).  Skin breakdown or ulcers.  Reddened or discolored skin on the front of the leg.  Brown, smooth, tight, and painful skin just above the ankle, usually on the inside of the leg (lipodermatosclerosis).  Swelling.  How is this diagnosed? This condition may be diagnosed based on:  Your medical history.  A physical exam.  Tests, such as: ? A procedure that creates an image of a blood vessel and nearby organs and provides information about blood flow through the blood vessel (duplex ultrasound). ? A procedure that tests blood flow (plethysmography). ? A procedure to look at the veins using X-ray and dye (venogram).  How is this treated? The goals of treatment are to help you return to an active life and to minimize pain or disability. Treatment depends on the severity of your condition, and it may include:  Wearing compression stockings. These can help relieve symptoms and help prevent your condition from getting worse. However, they do not cure the condition.  Sclerotherapy. This is a procedure involving an injection of a material that "dissolves" damaged veins.  Surgery. This may involve: ? Removing a diseased vein (vein stripping). ? Cutting off blood flow through the vein (laser ablation surgery). ? Repairing a valve.  Follow these instructions at home:  Wear compression stockings as told by your health care provider. These stockings help to prevent blood clots and reduce swelling in your legs.  Take over-the-counter and prescription medicines only as told by your health care provider.  Stay active by exercising, walking, or doing different activities. Ask your health care provider what activities are safe for you and how much exercise you need.  Drink enough fluid to keep your urine clear or pale yellow.  Do not use any  products that contain nicotine or tobacco, such as cigarettes and e-cigarettes. If you need help quitting, ask your health care provider.  Keep all follow-up visits as told by your health care provider. This is important. Contact a health care provider if:  You have redness, swelling, or more pain in the affected area.  You see a red streak or line that extends up or down from the affected area.  You have skin breakdown or a loss of skin in the affected area, even if the breakdown is small.  You get an injury in the affected area. Get help right away if:  You get an injury and an open wound in the affected area.  You have severe pain that does not get better with medicine.  You have sudden numbness or weakness in the foot or ankle below the affected area, or you have trouble moving your foot or ankle.  You have a fever and you have worse or persistent symptoms.  You have chest pain.  You have shortness of breath. Summary  Chronic venous insufficiency, also called venous stasis, is a condition that prevents blood from being pumped effectively through the veins in your legs.  Chronic venous insufficiency occurs when the vein walls become stretched, weakened, or damaged, or when valves within the vein are damaged.  Treatment for this condition depends on how severe your condition is, and it may involve wearing compression stockings or having a procedure.  Make sure you stay active by exercising, walking, or doing different activities. Ask your health care provider what activities are safe for you and how much exercise you need.  Okay to discontinue colchicine

## 2017-02-18 NOTE — Progress Notes (Signed)
Subjective:    Patient ID: Isaac Phelps, male    DOB: Sep 08, 1930, 83 y.o.   MRN: [id number]  HPI  83 year old patient who is seen today for a subsequent Medicare wellness visit as well as annual assessment. He has a history of essential hypertension.  He has been out of furosemide for some time.  He has chronic kidney disease, history of BPH, as well as coronary artery disease. Main complaints today include lower extremity edema as well as a chronic rash.  He has seen dermatology and prescribed a high potency steroid.  He was told that the rash had your He is also followed the [facility] hospital. He has a history of recurrent gout which has been stable.  He was last hospitalized September 2016 for cellulitis involving the lower extremities  Past Medical History:  Diagnosis Date  . Anxiety   . BENIGN PROSTATIC HYPERTROPHY 04/01/2008  . Coronary artery disease   . GERD (gastroesophageal reflux disease)   . HYPERLIPIDEMIA 05/26/2007  . HYPERTENSION 02/07/2007  . Syncope and collapse   . TOBACCO ABUSE 04/01/2008     Social History   Social History  . Marital status: Married    Spouse name: N/A  . Number of children: N/A  . Years of education: N/A   Occupational History  . Not on file.   Social History Main Topics  . Smoking status: Current Every Day Smoker    Packs/day: 0.30    Types: Cigarettes  . Smokeless tobacco: Never Used  . Alcohol use No  . Drug use: No  . Sexual activity: Not on file   Other Topics Concern  . Not on file   Social History Narrative  . No narrative on file    Past Surgical History:  Procedure Laterality Date  . APPENDECTOMY    . CORONARY ANGIOPLASTY    . KNEE ARTHROSCOPY    . TONSILLECTOMY    . TONSILLECTOMY      Family History  Problem Relation Age of Onset  . Arthritis Neg Hx        family  . Hypertension Neg Hx        family  . Stroke Neg Hx        family , 1st degree relative    Allergies  Allergen Reactions  . Sulfa  Antibiotics     Current Outpatient Prescriptions on File Prior to Visit  Medication Sig Dispense Refill  . allopurinol (ZYLOPRIM) 100 MG tablet Take 2 tablets (200 mg total) by mouth daily. 180 tablet 6  . clobetasol ointment (TEMOVATE) 5.45 % Apply 1 application topically 2 (two) times daily.   0  . Colchicine 0.6 MG CAPS 1 tablet twice daily 60 capsule 3  . diphenhydrAMINE-zinc acetate (BENADRYL) cream Apply topically 3 (three) times daily as needed for itching. 28.4 g 0  . furosemide (LASIX) 40 MG tablet take 1 tablet by mouth once daily if needed for SWELLING OF FEET OR ANKLES 30 tablet 3  . HYDROcodone-acetaminophen (NORCO/VICODIN) 5-325 MG tablet Take 1 tablet by mouth every 6 (six) hours as needed for moderate pain. 30 tablet 0  . losartan (COZAAR) 100 MG tablet Take 1 tablet (100 mg total) by mouth daily. 90 tablet 3  . simvastatin (ZOCOR) 40 MG tablet take 1/2 tablet by mouth once daily 90 tablet 3   No current facility-administered medications on file prior to visit.     BP (!) 178/84   Pulse 76   Temp 98.2 F (36.8 C) (  Oral)   Ht 5\' 10"  (1.778 m)   Wt 180 lb 3.2 oz (81.7 kg)   SpO2 98%   BMI 25.86 kg/m   Subsequent  Medicare wellness visit  1. Risk factors, based on past  M,S,F history.  Cardio vascular risk factors include hypertension and dyslipidemia  2.  Physical activities:fairly active but limited by his lower extremity edema  3.  Depression/mood:no history major depression or mood disorder  4.  Hearing:no major deficits  5.  ADL's:independent  6.  Fall risk:low  7.  Home safety:no problems identified  8.  Height weight, and visual acuity;height and weight stable no change in visual acuity  9.  Counseling:low-salt diet.  Stressed.  Patient will consider support hose  10. Lab orders based on risk factors:laboratory update will be reviewed including uric acid and lipid profile  11. Referral :not appropriate at this time  12. Care plan:continue efforts  at aggressive risk factor modification  13. Cognitive assessment: alert and order with normal affect no cognitive dysfunction  14. Screening: Patient provided with a written and personalized 5-10 year screening schedule in the AVS.    15. Provider List Update: primary care dermatology.    Review of Systems  Constitutional: Negative for appetite change, chills, fatigue and fever.  HENT: Negative for congestion, dental problem, ear pain, hearing loss, sore throat, tinnitus, trouble swallowing and voice change.   Eyes: Negative for pain, discharge and visual disturbance.  Respiratory: Negative for cough, chest tightness, wheezing and stridor.   Cardiovascular: Positive for leg swelling. Negative for chest pain and palpitations.  Gastrointestinal: Negative for abdominal distention, abdominal pain, blood in stool, constipation, diarrhea, nausea and vomiting.  Genitourinary: Negative for difficulty urinating, discharge, flank pain, genital sores, hematuria and urgency.  Musculoskeletal: Negative for arthralgias, back pain, gait problem, joint swelling, myalgias and neck stiffness.  Skin: Positive for rash.  Neurological: Negative for dizziness, syncope, speech difficulty, weakness, numbness and headaches.  Hematological: Negative for adenopathy. Does not bruise/bleed easily.  Psychiatric/Behavioral: Negative for behavioral problems and dysphoric mood. The patient is not nervous/anxious.        Objective:   Physical Exam  Constitutional: He appears well-developed and well-nourished.  HENT:  Head: Normocephalic and atraumatic.  Right Ear: External ear normal.  Left Ear: External ear normal.  Nose: Nose normal.  Mouth/Throat: Oropharynx is clear and moist.  Eyes: Pupils are equal, round, and reactive to light. Conjunctivae and EOM are normal. No scleral icterus.  Neck: Normal range of motion. Neck supple. No JVD present. No thyromegaly present.  Cardiovascular: Regular rhythm and normal  heart sounds.  Exam reveals no gallop and no friction rub.   No murmur heard. Pedal pulses nonpalpable due to the edema  Pulmonary/Chest: Effort normal and breath sounds normal. He exhibits no tenderness.  Abdominal: Soft. Bowel sounds are normal. He exhibits no distension and no mass. There is no tenderness.  Genitourinary: Prostate normal and penis normal. Rectal exam shows guaiac negative stool.  Genitourinary Comments: Uncircumcised Prominent symmetrical prostate enlargement  Musculoskeletal: Normal range of motion. He exhibits edema. He exhibits no tenderness.  Plus 2 edema involving the feet and ankles, slightly more prominent on the left  Lymphadenopathy:    He has no cervical adenopathy.  Neurological: He is alert. He has normal reflexes. No cranial nerve deficit. Coordination normal.  Skin: Skin is warm and dry. No rash noted.  Diffuse dermatitis involving the upper body area, especially the trunk and back.  Scattered areas of black follicular  plugs as well as larger erythematous papular lesions 0.5 to 1 cm in diameter  Psychiatric: He has a normal mood and affect. His behavior is normal.          Assessment & Plan:   Subsequent Medicare wellness visit Essential hypertension.  Suboptimal control.  Will resume furosemide and follow-up in 4 weeks History chronic kidney disease.  Will check lab History of gout.  Check uric acid level.  We'll discontinue colchicine BPH Dyslipidemia.  Will review a lipid profile  Follow-up 4 weeks  [name]

## 2017-02-19 LAB — FERRITIN: FERRITIN: 10.3 ng/mL — AB (ref 22.0–322.0)

## 2017-02-19 NOTE — Addendum Note (Signed)
Addended by: Abelardo Diesel on: 02/19/2017 09:08 AM   Modules accepted: Orders

## 2017-03-19 ENCOUNTER — Ambulatory Visit (INDEPENDENT_AMBULATORY_CARE_PROVIDER_SITE_OTHER): Payer: Medicare Other | Admitting: Internal Medicine

## 2017-03-19 ENCOUNTER — Encounter: Payer: Self-pay | Admitting: Internal Medicine

## 2017-03-19 VITALS — BP 188/78 | HR 70 | Temp 98.0°F | Ht 70.0 in | Wt 186.0 lb

## 2017-03-19 DIAGNOSIS — N183 Chronic kidney disease, stage 3 unspecified: Secondary | ICD-10-CM

## 2017-03-19 DIAGNOSIS — D500 Iron deficiency anemia secondary to blood loss (chronic): Secondary | ICD-10-CM | POA: Diagnosis not present

## 2017-03-19 DIAGNOSIS — I10 Essential (primary) hypertension: Secondary | ICD-10-CM

## 2017-03-19 DIAGNOSIS — E785 Hyperlipidemia, unspecified: Secondary | ICD-10-CM

## 2017-03-19 LAB — CBC WITH DIFFERENTIAL/PLATELET
BASOS ABS: 0.1 10*3/uL (ref 0.0–0.1)
Basophils Relative: 1.1 % (ref 0.0–3.0)
EOS ABS: 0.3 10*3/uL (ref 0.0–0.7)
Eosinophils Relative: 4.2 % (ref 0.0–5.0)
HEMATOCRIT: 30.7 % — AB (ref 39.0–52.0)
HEMOGLOBIN: 9.8 g/dL — AB (ref 13.0–17.0)
LYMPHS PCT: 16.4 % (ref 12.0–46.0)
Lymphs Abs: 1 10*3/uL (ref 0.7–4.0)
MCHC: 32 g/dL (ref 30.0–36.0)
MCV: 81.3 fl (ref 78.0–100.0)
Monocytes Absolute: 0.5 10*3/uL (ref 0.1–1.0)
Monocytes Relative: 7.8 % (ref 3.0–12.0)
Neutro Abs: 4.4 10*3/uL (ref 1.4–7.7)
Neutrophils Relative %: 70.5 % (ref 43.0–77.0)
PLATELETS: 188 10*3/uL (ref 150.0–400.0)
RBC: 3.77 Mil/uL — ABNORMAL LOW (ref 4.22–5.81)
RDW: 17 % — ABNORMAL HIGH (ref 11.5–15.5)
WBC: 6.2 10*3/uL (ref 4.0–10.5)

## 2017-03-19 NOTE — Progress Notes (Signed)
Subjective:    Patient ID: Isaac Phelps, male    DOB: [birth year], 83 y.o.   MRN: [id number]  HPI 83 year old patient who is seen today for follow-up. He was seen 1 month ago for his annual exam.  Rectal exam revealed hematest negative stool.  Laboratory screen revealed worsening anemia with a low ferritin level, suggesting iron deficiency anemia.  Denies any melena or history of GI bleeding.  He does have a history of , chronic kidney disease.  He has had remote colonoscopy, but none recently.  He is very reluctant to consider follow-up colonoscopy He was seen with significant peripheral edema that has improved with furosemide He generally feels well  Past Medical History:  Diagnosis Date  . Anxiety   . BENIGN PROSTATIC HYPERTROPHY 04/01/2008  . Coronary artery disease   . GERD (gastroesophageal reflux disease)   . HYPERLIPIDEMIA 05/26/2007  . HYPERTENSION 02/07/2007  . Syncope and collapse   . TOBACCO ABUSE 04/01/2008     Social History   Social History  . Marital status: Married    Spouse name: N/A  . Number of children: N/A  . Years of education: N/A   Occupational History  . Not on file.   Social History Main Topics  . Smoking status: Current Every Day Smoker    Packs/day: 0.30    Types: Cigarettes  . Smokeless tobacco: Never Used  . Alcohol use No  . Drug use: No  . Sexual activity: Not on file   Other Topics Concern  . Not on file   Social History Narrative  . No narrative on file    Past Surgical History:  Procedure Laterality Date  . APPENDECTOMY    . CORONARY ANGIOPLASTY    . KNEE ARTHROSCOPY    . TONSILLECTOMY    . TONSILLECTOMY      Family History  Problem Relation Age of Onset  . Arthritis Neg Hx        family  . Hypertension Neg Hx        family  . Stroke Neg Hx        family , 1st degree relative    Allergies  Allergen Reactions  . Sulfa Antibiotics     Current Outpatient Prescriptions on File Prior to Visit  Medication Sig  Dispense Refill  . allopurinol (ZYLOPRIM) 100 MG tablet Take 2 tablets (200 mg total) by mouth daily. 180 tablet 6  . clobetasol ointment (TEMOVATE) 7.16 % Apply 1 application topically 2 (two) times daily.   0  . diphenhydrAMINE-zinc acetate (BENADRYL) cream Apply topically 3 (three) times daily as needed for itching. 28.4 g 0  . furosemide (LASIX) 40 MG tablet take 1 tablet by mouth once daily if needed for SWELLING OF FEET OR ANKLES 90 tablet 3  . HYDROcodone-acetaminophen (NORCO/VICODIN) 5-325 MG tablet Take 1 tablet by mouth every 6 (six) hours as needed for moderate pain. 30 tablet 0  . losartan (COZAAR) 100 MG tablet Take 1 tablet (100 mg total) by mouth daily. 90 tablet 3  . simvastatin (ZOCOR) 40 MG tablet Take 0.5 tablets (20 mg total) by mouth daily. 90 tablet 3   No current facility-administered medications on file prior to visit.     BP (!) 188/78 (BP Location: Left Arm, Patient Position: Sitting, Cuff Size: Normal)   Pulse 70   Temp 98 F (36.7 C) (Oral)   Ht 5\' 10"  (1.778 m)   Wt 186 lb (84.4 kg)   SpO2 97%  BMI 26.69 kg/m      Review of Systems  Constitutional: Negative for appetite change, chills, fatigue and fever.  HENT: Negative for congestion, dental problem, ear pain, hearing loss, sore throat, tinnitus, trouble swallowing and voice change.   Eyes: Negative for pain, discharge and visual disturbance.  Respiratory: Negative for cough, chest tightness, wheezing and stridor.   Cardiovascular: Positive for leg swelling. Negative for chest pain and palpitations.  Gastrointestinal: Negative for abdominal distention, abdominal pain, blood in stool, constipation, diarrhea, nausea and vomiting.  Genitourinary: Negative for difficulty urinating, discharge, flank pain, genital sores, hematuria and urgency.  Musculoskeletal: Negative for arthralgias, back pain, gait problem, joint swelling, myalgias and neck stiffness.  Skin: Positive for rash.  Neurological: Negative  for dizziness, syncope, speech difficulty, weakness, numbness and headaches.  Hematological: Negative for adenopathy. Does not bruise/bleed easily.  Psychiatric/Behavioral: Negative for behavioral problems and dysphoric mood. The patient is not nervous/anxious.        Objective:   Physical Exam  Constitutional: He is oriented to person, place, and time. He appears well-developed.  Blood pressure 160/70  HENT:  Head: Normocephalic.  Right Ear: External ear normal.  Left Ear: External ear normal.  Eyes: Conjunctivae and EOM are normal.  Neck: Normal range of motion.  Cardiovascular: Normal rate and normal heart sounds.   Pulmonary/Chest: He has rales.  A few bibasilar rales, right greater than left  Abdominal: Bowel sounds are normal.  Musculoskeletal: Normal range of motion. He exhibits edema. He exhibits no tenderness.  Plus 1.  Left ankle edema  Neurological: He is alert and oriented to person, place, and time.  Psychiatric: He has a normal mood and affect. His behavior is normal.          Assessment & Plan:   Anemia Rule out GI bleed.  Will check stool for occult blood times 4 and also do Cologuard testing.  Consider colonoscopy if either screening test is positive  Recheck CBC Flu vaccine, declined Iron supplement  [name]

## 2017-03-19 NOTE — Patient Instructions (Signed)
Take an iron supplement twice daily  Return Hemoccult slides to check stool for hidden blood ( 4 different bowel movements)  Cologuard testing as discussed  Return in 3 months for follow-up

## 2017-04-02 ENCOUNTER — Encounter: Payer: Self-pay | Admitting: Internal Medicine

## 2017-04-02 ENCOUNTER — Ambulatory Visit (INDEPENDENT_AMBULATORY_CARE_PROVIDER_SITE_OTHER): Payer: Medicare Other | Admitting: Internal Medicine

## 2017-04-02 VITALS — BP 140/70 | HR 65 | Temp 97.8°F | Ht 70.0 in | Wt 185.0 lb

## 2017-04-02 DIAGNOSIS — I10 Essential (primary) hypertension: Secondary | ICD-10-CM

## 2017-04-02 DIAGNOSIS — D500 Iron deficiency anemia secondary to blood loss (chronic): Secondary | ICD-10-CM | POA: Diagnosis not present

## 2017-04-02 DIAGNOSIS — N183 Chronic kidney disease, stage 3 unspecified: Secondary | ICD-10-CM

## 2017-04-02 LAB — CBC WITH DIFFERENTIAL/PLATELET
BASOS ABS: 0.1 10*3/uL (ref 0.0–0.1)
Basophils Relative: 1.5 % (ref 0.0–3.0)
EOS ABS: 0.3 10*3/uL (ref 0.0–0.7)
Eosinophils Relative: 3.8 % (ref 0.0–5.0)
HEMATOCRIT: 32.5 % — AB (ref 39.0–52.0)
HEMOGLOBIN: 10.4 g/dL — AB (ref 13.0–17.0)
LYMPHS PCT: 17.5 % (ref 12.0–46.0)
Lymphs Abs: 1.2 10*3/uL (ref 0.7–4.0)
MCHC: 31.9 g/dL (ref 30.0–36.0)
MCV: 81.4 fl (ref 78.0–100.0)
MONOS PCT: 8.3 % (ref 3.0–12.0)
Monocytes Absolute: 0.5 10*3/uL (ref 0.1–1.0)
NEUTROS ABS: 4.6 10*3/uL (ref 1.4–7.7)
Neutrophils Relative %: 68.9 % (ref 43.0–77.0)
Platelets: 228 10*3/uL (ref 150.0–400.0)
RBC: 4 Mil/uL — AB (ref 4.22–5.81)
RDW: 18 % — ABNORMAL HIGH (ref 11.5–15.5)
WBC: 6.6 10*3/uL (ref 4.0–10.5)

## 2017-04-02 MED ORDER — OMEPRAZOLE 20 MG PO CPDR: 20.0000 mg | DELAYED_RELEASE_CAPSULE | Freq: Every day | ORAL | 3 refills | Status: DC

## 2017-04-02 NOTE — Progress Notes (Signed)
Subjective:    Patient ID: Isaac Phelps, male    DOB: March 26, 1931, 83 y.o.   MRN: [id number]  HPI  83 year old patient who is seen today for follow-up of anemia.  Laboratory studies suggested iron deficiency anemia with a low ferritin level. Initial stool for occult blood was negative.  He has failed to return additional stool samples for FOB testing.  He has received supplies for Cologuard testing, which she has not sent back and he generally feels well He has been reluctant to consider a follow-up colonoscopy. The possibility of colon cancer.  Discussed  The patient states that he does take occasional Advil for knee pain.  Past Medical History:  Diagnosis Date  . Anxiety   . BENIGN PROSTATIC HYPERTROPHY 04/01/2008  . Coronary artery disease   . GERD (gastroesophageal reflux disease)   . HYPERLIPIDEMIA 05/26/2007  . HYPERTENSION 02/07/2007  . Syncope and collapse   . TOBACCO ABUSE 04/01/2008     Social History   Social History  . Marital status: Married    Spouse name: N/A  . Number of children: N/A  . Years of education: N/A   Occupational History  . Not on file.   Social History Main Topics  . Smoking status: Current Every Day Smoker    Packs/day: 0.30    Types: Cigarettes  . Smokeless tobacco: Never Used  . Alcohol use No  . Drug use: No  . Sexual activity: Not on file   Other Topics Concern  . Not on file   Social History Narrative  . No narrative on file    Past Surgical History:  Procedure Laterality Date  . APPENDECTOMY    . CORONARY ANGIOPLASTY    . KNEE ARTHROSCOPY    . TONSILLECTOMY    . TONSILLECTOMY      Family History  Problem Relation Age of Onset  . Arthritis Neg Hx        family  . Hypertension Neg Hx        family  . Stroke Neg Hx        family , 1st degree relative    Allergies  Allergen Reactions  . Sulfa Antibiotics     Current Outpatient Prescriptions on File Prior to Visit  Medication Sig Dispense Refill  .  allopurinol (ZYLOPRIM) 100 MG tablet Take 2 tablets (200 mg total) by mouth daily. 180 tablet 6  . clobetasol ointment (TEMOVATE) 6.73 % Apply 1 application topically 2 (two) times daily.   0  . furosemide (LASIX) 40 MG tablet take 1 tablet by mouth once daily if needed for SWELLING OF FEET OR ANKLES 90 tablet 3  . losartan (COZAAR) 100 MG tablet Take 1 tablet (100 mg total) by mouth daily. 90 tablet 3  . simvastatin (ZOCOR) 40 MG tablet Take 0.5 tablets (20 mg total) by mouth daily. 90 tablet 3   No current facility-administered medications on file prior to visit.     BP 140/70 (BP Location: Left Arm, Patient Position: Sitting, Cuff Size: Normal)   Pulse 65   Temp 97.8 F (36.6 C) (Oral)   Ht 5\' 10"  (1.778 m)   Wt 185 lb (83.9 kg)   SpO2 91%   BMI 26.54 kg/m     Review of Systems  Constitutional: Negative for appetite change, chills, fatigue and fever.  HENT: Negative for congestion, dental problem, ear pain, hearing loss, sore throat, tinnitus, trouble swallowing and voice change.   Eyes: Negative for pain, discharge and visual  disturbance.  Respiratory: Negative for cough, chest tightness, wheezing and stridor.   Cardiovascular: Negative for chest pain, palpitations and leg swelling.  Gastrointestinal: Negative for abdominal distention, abdominal pain, blood in stool, constipation, diarrhea, nausea and vomiting.  Genitourinary: Negative for difficulty urinating, discharge, flank pain, genital sores, hematuria and urgency.  Musculoskeletal: Negative for arthralgias, back pain, gait problem, joint swelling, myalgias and neck stiffness.  Skin: Negative for rash.  Neurological: Negative for dizziness, syncope, speech difficulty, weakness, numbness and headaches.  Hematological: Negative for adenopathy. Does not bruise/bleed easily.  Psychiatric/Behavioral: Negative for behavioral problems and dysphoric mood. The patient is not nervous/anxious.        Objective:   Physical Exam    Constitutional: He is oriented to person, place, and time. He appears well-developed.  HENT:  Head: Normocephalic.  Right Ear: External ear normal.  Left Ear: External ear normal.  Eyes: Conjunctivae and EOM are normal.  Neck: Normal range of motion.  Cardiovascular: Normal rate and normal heart sounds.   Pulmonary/Chest: Breath sounds normal.  Abdominal: Bowel sounds are normal.  Musculoskeletal: Normal range of motion. He exhibits no edema or tenderness.  Neurological: He is alert and oriented to person, place, and time.  Psychiatric: He has a normal mood and affect. His behavior is normal.          Assessment & Plan:   Suspected iron deficiency anemia.  The importance of returning stool samples to check for FOB stressed as well as Cologuard testing. We'll continue iron supplement and recheck next month Consider referral for colonoscopy if GI bleeding demonstrated or cologuard test is positive. Patient has been asked to discontinue ibuprofen and all anti-inflammatory medications and to use Tylenol only  [name]

## 2017-04-02 NOTE — Patient Instructions (Signed)
Take [phone] mg of Tylenol every 6 hours as needed for pain relief or fever.  Avoid taking more than 3000 mg in a 24-hour period (  This may cause liver damage).  Do not use  Advil, ibuprofen, naproxen, Naprosyn for arthritis pain  Return in one month for follow-up  Return the 2 slides to check stool for hidden blood on 4 occasions to the office  Cologuard  testing as discussed

## 2017-04-30 ENCOUNTER — Ambulatory Visit: Payer: Medicare Other | Admitting: Internal Medicine

## 2017-05-06 ENCOUNTER — Other Ambulatory Visit (INDEPENDENT_AMBULATORY_CARE_PROVIDER_SITE_OTHER): Payer: Medicare Other

## 2017-05-06 DIAGNOSIS — K921 Melena: Secondary | ICD-10-CM | POA: Diagnosis not present

## 2017-05-07 ENCOUNTER — Encounter: Payer: Self-pay | Admitting: Internal Medicine

## 2017-05-07 ENCOUNTER — Ambulatory Visit (INDEPENDENT_AMBULATORY_CARE_PROVIDER_SITE_OTHER): Payer: Medicare Other | Admitting: Internal Medicine

## 2017-05-07 VITALS — BP 148/62 | HR 70 | Temp 98.2°F | Ht 70.0 in | Wt 184.2 lb

## 2017-05-07 DIAGNOSIS — D638 Anemia in other chronic diseases classified elsewhere: Secondary | ICD-10-CM

## 2017-05-07 DIAGNOSIS — N183 Chronic kidney disease, stage 3 unspecified: Secondary | ICD-10-CM

## 2017-05-07 DIAGNOSIS — I10 Essential (primary) hypertension: Secondary | ICD-10-CM

## 2017-05-07 DIAGNOSIS — E785 Hyperlipidemia, unspecified: Secondary | ICD-10-CM

## 2017-05-07 DIAGNOSIS — F17200 Nicotine dependence, unspecified, uncomplicated: Secondary | ICD-10-CM | POA: Diagnosis not present

## 2017-05-07 LAB — POC HEMOCCULT BLD/STL (HOME/3-CARD/SCREEN)
Card #2 Fecal Occult Blod, POC: NEGATIVE
FECAL OCCULT BLD: NEGATIVE
FECAL OCCULT BLD: NEGATIVE

## 2017-05-07 NOTE — Progress Notes (Signed)
Subjective:    Patient ID: Isaac Phelps, male    DOB: 10-Mar-1931, 83 y.o.   MRN: [id number]  HPI  83 year old patient who is seen today for follow-up of anemia. Yesterday he returned stool samples to check for occult blood all of which were negative He has discontinued anti-inflammatory medications He remains on iron and PPI therapy.  He does have a Cologuard kit at home which she has not returned for evaluation  Generally feels well.  Using Tylenol for arthritic pain only He does have a history of chronic kidney disease  Past Medical History:  Diagnosis Date  . Anxiety   . BENIGN PROSTATIC HYPERTROPHY 04/01/2008  . Coronary artery disease   . GERD (gastroesophageal reflux disease)   . HYPERLIPIDEMIA 05/26/2007  . HYPERTENSION 02/07/2007  . Syncope and collapse   . TOBACCO ABUSE 04/01/2008     Social History   Socioeconomic History  . Marital status: Married    Spouse name: Not on file  . Number of children: Not on file  . Years of education: Not on file  . Highest education level: Not on file  Social Needs  . Financial resource strain: Not on file  . Food insecurity - worry: Not on file  . Food insecurity - inability: Not on file  . Transportation needs - medical: Not on file  . Transportation needs - non-medical: Not on file  Occupational History  . Not on file  Tobacco Use  . Smoking status: Current Every Day Smoker    Packs/day: 0.30    Types: Cigarettes  . Smokeless tobacco: Never Used  Substance and Sexual Activity  . Alcohol use: No  . Drug use: No  . Sexual activity: Not on file  Other Topics Concern  . Not on file  Social History Narrative  . Not on file    Past Surgical History:  Procedure Laterality Date  . APPENDECTOMY    . CORONARY ANGIOPLASTY    . KNEE ARTHROSCOPY    . TONSILLECTOMY    . TONSILLECTOMY      Family History  Problem Relation Age of Onset  . Arthritis Neg Hx        family  . Hypertension Neg Hx        family  .  Stroke Neg Hx        family , 1st degree relative    Allergies  Allergen Reactions  . Sulfa Antibiotics     Current Outpatient Medications on File Prior to Visit  Medication Sig Dispense Refill  . allopurinol (ZYLOPRIM) 100 MG tablet Take 2 tablets (200 mg total) by mouth daily. 180 tablet 6  . clobetasol ointment (TEMOVATE) 5.11 % Apply 1 application topically 2 (two) times daily.   0  . furosemide (LASIX) 40 MG tablet take 1 tablet by mouth once daily if needed for SWELLING OF FEET OR ANKLES 90 tablet 3  . losartan (COZAAR) 100 MG tablet Take 1 tablet (100 mg total) by mouth daily. 90 tablet 3  . simvastatin (ZOCOR) 40 MG tablet Take 0.5 tablets (20 mg total) by mouth daily. 90 tablet 3   No current facility-administered medications on file prior to visit.     BP (!) 148/62 (BP Location: Left Arm, Patient Position: Sitting, Cuff Size: Normal)   Pulse 70   Temp 98.2 F (36.8 C) (Oral)   Ht '5\' 10"'  (1.778 m)   Wt 184 lb 3.2 oz (83.6 kg)   SpO2 98%   BMI 26.43  kg/m     Review of Systems  Constitutional: Negative for appetite change, chills, fatigue and fever.  HENT: Negative for congestion, dental problem, ear pain, hearing loss, sore throat, tinnitus, trouble swallowing and voice change.   Eyes: Negative for pain, discharge and visual disturbance.  Respiratory: Negative for cough, chest tightness, wheezing and stridor.   Cardiovascular: Negative for chest pain, palpitations and leg swelling.  Gastrointestinal: Negative for abdominal distention, abdominal pain, blood in stool, constipation, diarrhea, nausea and vomiting.  Genitourinary: Negative for difficulty urinating, discharge, flank pain, genital sores, hematuria and urgency.  Musculoskeletal: Negative for arthralgias, back pain, gait problem, joint swelling, myalgias and neck stiffness.  Skin: Negative for rash.  Neurological: Negative for dizziness, syncope, speech difficulty, weakness, numbness and headaches.    Hematological: Negative for adenopathy. Does not bruise/bleed easily.  Psychiatric/Behavioral: Negative for behavioral problems and dysphoric mood. The patient is not nervous/anxious.        Objective:   Physical Exam  Constitutional: He is oriented to person, place, and time. He appears well-developed.  Blood pressure 140/68  HENT:  Head: Normocephalic.  Right Ear: External ear normal.  Left Ear: External ear normal.  Eyes: Conjunctivae and EOM are normal.  Neck: Normal range of motion.  Cardiovascular: Normal rate and normal heart sounds.  Pulmonary/Chest: Breath sounds normal.  Abdominal: Bowel sounds are normal.  Musculoskeletal: Normal range of motion. He exhibits no edema or tenderness.  Neurological: He is alert and oriented to person, place, and time.  Psychiatric: He has a normal mood and affect. His behavior is normal.          Assessment & Plan:   Anemia.  No evidence of GI bleeding.  Probably anemia of chronic disease secondary to chronic kidney disease Chronic kidney disease stable Essential hypertension  The patient has been asked to perform Colo guard testing Avoid all anti-inflammatories Follow-up 4 months  [name]

## 2017-05-07 NOTE — Patient Instructions (Addendum)
Avoid all anti-inflammatory medications such as ibuprofen naproxen Aleve Advil Motrin  Okay to use Tylenol  Return in 4 months for follow-up  Cologuard testing

## 2017-06-19 ENCOUNTER — Ambulatory Visit: Payer: Medicare Other | Admitting: Internal Medicine

## 2017-06-19 DIAGNOSIS — Z0289 Encounter for other administrative examinations: Secondary | ICD-10-CM

## 2017-07-06 LAB — COLOGUARD: COLOGUARD: NEGATIVE

## 2017-07-23 ENCOUNTER — Encounter: Payer: Self-pay | Admitting: Internal Medicine

## 2017-07-24 ENCOUNTER — Encounter: Payer: Self-pay | Admitting: Internal Medicine

## 2017-09-30 ENCOUNTER — Ambulatory Visit: Payer: Medicare Other | Admitting: Internal Medicine

## 2017-10-22 ENCOUNTER — Encounter: Payer: Self-pay | Admitting: Internal Medicine

## 2017-10-22 ENCOUNTER — Ambulatory Visit (INDEPENDENT_AMBULATORY_CARE_PROVIDER_SITE_OTHER): Payer: Medicare Other | Admitting: Internal Medicine

## 2017-10-22 VITALS — BP 180/90 | HR 70 | Temp 98.4°F | Wt 175.0 lb

## 2017-10-22 DIAGNOSIS — N183 Chronic kidney disease, stage 3 unspecified: Secondary | ICD-10-CM

## 2017-10-22 DIAGNOSIS — I10 Essential (primary) hypertension: Secondary | ICD-10-CM

## 2017-10-22 DIAGNOSIS — D638 Anemia in other chronic diseases classified elsewhere: Secondary | ICD-10-CM | POA: Diagnosis not present

## 2017-10-22 LAB — COMPREHENSIVE METABOLIC PANEL
ALBUMIN: 3.9 g/dL (ref 3.5–5.2)
ALK PHOS: 92 U/L (ref 39–117)
ALT: 11 U/L (ref 0–53)
AST: 32 U/L (ref 0–37)
BILIRUBIN TOTAL: 0.4 mg/dL (ref 0.2–1.2)
BUN: 24 mg/dL — ABNORMAL HIGH (ref 6–23)
CALCIUM: 9 mg/dL (ref 8.4–10.5)
CO2: 28 mEq/L (ref 19–32)
Chloride: 107 mEq/L (ref 96–112)
Creatinine, Ser: 1.53 mg/dL — ABNORMAL HIGH (ref 0.40–1.50)
GFR: 55.94 mL/min — ABNORMAL LOW (ref >60.00)
Glucose, Bld: 87 mg/dL (ref 70–99)
POTASSIUM: 4 meq/L (ref 3.5–5.1)
Sodium: 141 mEq/L (ref 135–145)
Total Protein: 7 g/dL (ref 6.0–8.3)

## 2017-10-22 LAB — CBC WITH DIFFERENTIAL/PLATELET
BASOS ABS: 0.1 10*3/uL (ref 0.0–0.1)
Basophils Relative: 0.9 % (ref 0.0–3.0)
Eosinophils Absolute: 0.2 10*3/uL (ref 0.0–0.7)
Eosinophils Relative: 4.2 % (ref 0.0–5.0)
HEMATOCRIT: 33.2 % — AB (ref 39.0–52.0)
HEMOGLOBIN: 10.8 g/dL — AB (ref 13.0–17.0)
LYMPHS ABS: 0.9 10*3/uL (ref 0.7–4.0)
LYMPHS PCT: 16.6 % (ref 12.0–46.0)
MCHC: 32.6 g/dL (ref 30.0–36.0)
MCV: 82.8 fl (ref 78.0–100.0)
MONOS PCT: 8 % (ref 3.0–12.0)
Monocytes Absolute: 0.5 10*3/uL (ref 0.1–1.0)
NEUTROS PCT: 70.3 % (ref 43.0–77.0)
Neutro Abs: 4 10*3/uL (ref 1.4–7.7)
Platelets: 203 10*3/uL (ref 150.0–400.0)
RBC: 4 Mil/uL — AB (ref 4.22–5.81)
RDW: 16 % — ABNORMAL HIGH (ref 11.5–15.5)
WBC: 5.7 10*3/uL (ref 4.0–10.5)

## 2017-10-22 LAB — FERRITIN: FERRITIN: 12.8 ng/mL — AB (ref 22.0–322.0)

## 2017-10-22 MED ORDER — AMLODIPINE BESYLATE 2.5 MG PO TABS: 2.5000 mg | ORAL_TABLET | Freq: Every day | ORAL | 3 refills | Status: DC

## 2017-10-22 NOTE — Patient Instructions (Addendum)
Amlodipine 2.5 mg daily  Limit your sodium (Salt) intake  Return in 1 month for follow-up

## 2017-10-22 NOTE — Progress Notes (Signed)
Subjective:    Patient ID: Isaac Phelps, male    DOB: 29-Dec-1930, 84 y.o.   MRN: [id number]  HPI  84 year old patient who is seen today for follow-up of essential hypertension and anemia Since his last visit here he did have Cologuard screening which was negative.  He generally feels well today.  He has a history of gout which has been stable.  He has chronic kidney disease and dyslipidemia.  Past Medical History:  Diagnosis Date  . Anxiety   . BENIGN PROSTATIC HYPERTROPHY 04/01/2008  . Coronary artery disease   . GERD (gastroesophageal reflux disease)   . HYPERLIPIDEMIA 05/26/2007  . HYPERTENSION 02/07/2007  . Syncope and collapse   . TOBACCO ABUSE 04/01/2008     Social History   Socioeconomic History  . Marital status: Married    Spouse name: Not on file  . Number of children: Not on file  . Years of education: Not on file  . Highest education level: Not on file  Occupational History  . Not on file  Social Needs  . Financial resource strain: Not on file  . Food insecurity:    Worry: Not on file    Inability: Not on file  . Transportation needs:    Medical: Not on file    Non-medical: Not on file  Tobacco Use  . Smoking status: Current Every Day Smoker    Packs/day: 0.30    Types: Cigarettes  . Smokeless tobacco: Never Used  Substance and Sexual Activity  . Alcohol use: No  . Drug use: No  . Sexual activity: Not on file  Lifestyle  . Physical activity:    Days per week: Not on file    Minutes per session: Not on file  . Stress: Not on file  Relationships  . Social connections:    Talks on phone: Not on file    Gets together: Not on file    Attends religious service: Not on file    Active member of club or organization: Not on file    Attends meetings of clubs or organizations: Not on file    Relationship status: Not on file  . Intimate partner violence:    Fear of current or ex partner: Not on file    Emotionally abused: Not on file    Physically  abused: Not on file    Forced sexual activity: Not on file  Other Topics Concern  . Not on file  Social History Narrative  . Not on file    Past Surgical History:  Procedure Laterality Date  . APPENDECTOMY    . CORONARY ANGIOPLASTY    . KNEE ARTHROSCOPY    . TONSILLECTOMY    . TONSILLECTOMY      Family History  Problem Relation Age of Onset  . Arthritis Neg Hx        family  . Hypertension Neg Hx        family  . Stroke Neg Hx        family , 1st degree relative    Allergies  Allergen Reactions  . Sulfa Antibiotics     Current Outpatient Medications on File Prior to Visit  Medication Sig Dispense Refill  . allopurinol (ZYLOPRIM) 100 MG tablet Take 2 tablets (200 mg total) by mouth daily. 180 tablet 6  . clobetasol ointment (TEMOVATE) 4.12 % Apply 1 application topically 2 (two) times daily.   0  . furosemide (LASIX) 40 MG tablet take 1 tablet by mouth once  daily if needed for SWELLING OF FEET OR ANKLES 90 tablet 3  . losartan (COZAAR) 100 MG tablet Take 1 tablet (100 mg total) by mouth daily. 90 tablet 3  . simvastatin (ZOCOR) 40 MG tablet Take 0.5 tablets (20 mg total) by mouth daily. 90 tablet 3   No current facility-administered medications on file prior to visit.     BP (!) 180/90 (BP Location: Right Arm, Patient Position: Sitting, Cuff Size: Large)   Pulse 70   Temp 98.4 F (36.9 C) (Oral)   Wt 175 lb (79.4 kg)   SpO2 98%   BMI 25.11 kg/m     Review of Systems  Constitutional: Negative for appetite change, chills, fatigue and fever.  HENT: Negative for congestion, dental problem, ear pain, hearing loss, sore throat, tinnitus, trouble swallowing and voice change.   Eyes: Negative for pain, discharge and visual disturbance.  Respiratory: Negative for cough, chest tightness, wheezing and stridor.   Cardiovascular: Negative for chest pain, palpitations and leg swelling.  Gastrointestinal: Negative for abdominal distention, abdominal pain, blood in stool,  constipation, diarrhea, nausea and vomiting.  Genitourinary: Negative for difficulty urinating, discharge, flank pain, genital sores, hematuria and urgency.  Musculoskeletal: Negative for arthralgias, back pain, gait problem, joint swelling, myalgias and neck stiffness.  Skin: Positive for rash.  Neurological: Negative for dizziness, syncope, speech difficulty, weakness, numbness and headaches.  Hematological: Negative for adenopathy. Does not bruise/bleed easily.  Psychiatric/Behavioral: Negative for behavioral problems and dysphoric mood. The patient is not nervous/anxious.        Objective:   Physical Exam  Constitutional: He is oriented to person, place, and time. He appears well-developed.  Blood pressure 180/85  HENT:  Head: Normocephalic.  Right Ear: External ear normal.  Left Ear: External ear normal.  Eyes: Conjunctivae and EOM are normal.  Neck: Normal range of motion.  Cardiovascular: Normal rate and normal heart sounds.  Pulmonary/Chest: Breath sounds normal.  Abdominal: Bowel sounds are normal.  Musculoskeletal: Normal range of motion. He exhibits no edema or tenderness.  Neurological: He is alert and oriented to person, place, and time.  Skin: Rash noted.  Scattered dermatitis most marked over the anterior chest and abdomen.  The patient has innumerable hyperpigmented 1 mm follicular plugs.  He has seen dermatology in the past and was told this is chronic and not really treatable;  he denies any pruritus  Psychiatric: He has a normal mood and affect. His behavior is normal.          Assessment & Plan:   Hypertension.  Blood pressure poorly controlled today we will add amlodipine 2.5 mg daily.  Recheck 4 weeks History of anemia.  Will review a CBC with ferritin level Chronic kidney disease.  We will recheck renal indices Dyslipidemia continue simvastatin  Follow-up 1 month  KWIATKOWSKI,PETER Pilar Plate

## 2017-10-23 NOTE — Progress Notes (Signed)
Spoke to pt and he will return in a month instead of 3 months per Dr.Kwiatkowski. No further action needed!

## 2017-11-25 ENCOUNTER — Ambulatory Visit (INDEPENDENT_AMBULATORY_CARE_PROVIDER_SITE_OTHER): Payer: Medicare Other | Admitting: Internal Medicine

## 2017-11-25 ENCOUNTER — Encounter: Payer: Self-pay | Admitting: Internal Medicine

## 2017-11-25 VITALS — BP 170/80 | HR 67 | Temp 98.4°F | Wt 178.0 lb

## 2017-11-25 DIAGNOSIS — E785 Hyperlipidemia, unspecified: Secondary | ICD-10-CM | POA: Diagnosis not present

## 2017-11-25 DIAGNOSIS — M109 Gout, unspecified: Secondary | ICD-10-CM | POA: Diagnosis not present

## 2017-11-25 DIAGNOSIS — I10 Essential (primary) hypertension: Secondary | ICD-10-CM | POA: Diagnosis not present

## 2017-11-25 MED ORDER — AMLODIPINE BESYLATE 5 MG PO TABS: 5.0000 mg | ORAL_TABLET | Freq: Every day | ORAL | 2 refills | Status: DC

## 2017-11-25 NOTE — Progress Notes (Signed)
Subjective:    Patient ID: Isaac Phelps, male    DOB: 1931/03/18, 84 y.o.   MRN: [id number]  HPI  84 year old patient who is seen today for follow-up of hypertension.  Last visit amlodipine 2.5 mg was added to his regimen.  He does admit to skipping a number of doses over the past several weeks. He generally feels well although concerned about the health of his wife who has worsening short-term memory deficits  Past Medical History:  Diagnosis Date  . Anxiety   . BENIGN PROSTATIC HYPERTROPHY 04/01/2008  . Coronary artery disease   . GERD (gastroesophageal reflux disease)   . HYPERLIPIDEMIA 05/26/2007  . HYPERTENSION 02/07/2007  . Syncope and collapse   . TOBACCO ABUSE 04/01/2008     Social History   Socioeconomic History  . Marital status: Married    Spouse name: Not on file  . Number of children: Not on file  . Years of education: Not on file  . Highest education level: Not on file  Occupational History  . Not on file  Social Needs  . Financial resource strain: Not on file  . Food insecurity:    Worry: Not on file    Inability: Not on file  . Transportation needs:    Medical: Not on file    Non-medical: Not on file  Tobacco Use  . Smoking status: Current Every Day Smoker    Packs/day: 0.30    Types: Cigarettes  . Smokeless tobacco: Never Used  Substance and Sexual Activity  . Alcohol use: No  . Drug use: No  . Sexual activity: Not on file  Lifestyle  . Physical activity:    Days per week: Not on file    Minutes per session: Not on file  . Stress: Not on file  Relationships  . Social connections:    Talks on phone: Not on file    Gets together: Not on file    Attends religious service: Not on file    Active member of club or organization: Not on file    Attends meetings of clubs or organizations: Not on file    Relationship status: Not on file  . Intimate partner violence:    Fear of current or ex partner: Not on file    Emotionally abused: Not on file      Physically abused: Not on file    Forced sexual activity: Not on file  Other Topics Concern  . Not on file  Social History Narrative  . Not on file    Past Surgical History:  Procedure Laterality Date  . APPENDECTOMY    . CORONARY ANGIOPLASTY    . KNEE ARTHROSCOPY    . TONSILLECTOMY    . TONSILLECTOMY      Family History  Problem Relation Age of Onset  . Arthritis Neg Hx        family  . Hypertension Neg Hx        family  . Stroke Neg Hx        family , 1st degree relative    Allergies  Allergen Reactions  . Sulfa Antibiotics     Current Outpatient Medications on File Prior to Visit  Medication Sig Dispense Refill  . allopurinol (ZYLOPRIM) 100 MG tablet Take 2 tablets (200 mg total) by mouth daily. 180 tablet 6  . clobetasol ointment (TEMOVATE) 2.94 % Apply 1 application topically 2 (two) times daily.   0  . furosemide (LASIX) 40 MG tablet take 1  tablet by mouth once daily if needed for SWELLING OF FEET OR ANKLES 90 tablet 3  . losartan (COZAAR) 100 MG tablet Take 1 tablet (100 mg total) by mouth daily. 90 tablet 3  . simvastatin (ZOCOR) 40 MG tablet Take 0.5 tablets (20 mg total) by mouth daily. 90 tablet 3   No current facility-administered medications on file prior to visit.     BP (!) 170/80 (BP Location: Right Arm, Patient Position: Sitting, Cuff Size: Large)   Pulse 67   Temp 98.4 F (36.9 C) (Oral)   Wt 178 lb (80.7 kg)   SpO2 98%   BMI 25.54 kg/m     Review of Systems  Constitutional: Negative for appetite change, chills, fatigue and fever.  HENT: Negative for congestion, dental problem, ear pain, hearing loss, sore throat, tinnitus, trouble swallowing and voice change.   Eyes: Negative for pain, discharge and visual disturbance.  Respiratory: Negative for cough, chest tightness, wheezing and stridor.   Cardiovascular: Negative for chest pain, palpitations and leg swelling.  Gastrointestinal: Negative for abdominal distention, abdominal pain,  blood in stool, constipation, diarrhea, nausea and vomiting.  Genitourinary: Negative for difficulty urinating, discharge, flank pain, genital sores, hematuria and urgency.  Musculoskeletal: Negative for arthralgias, back pain, gait problem, joint swelling, myalgias and neck stiffness.  Skin: Negative for rash.  Neurological: Negative for dizziness, syncope, speech difficulty, weakness, numbness and headaches.  Hematological: Negative for adenopathy. Does not bruise/bleed easily.  Psychiatric/Behavioral: Negative for behavioral problems and dysphoric mood. The patient is not nervous/anxious.        Objective:   Physical Exam  Constitutional: He is oriented to person, place, and time. He appears well-developed.  Blood pressure 160/78  HENT:  Head: Normocephalic.  Right Ear: External ear normal.  Left Ear: External ear normal.  Eyes: Conjunctivae and EOM are normal.  Neck: Normal range of motion.  Cardiovascular: Normal rate and normal heart sounds.  Pulmonary/Chest: Breath sounds normal.  Abdominal: Bowel sounds are normal.  Musculoskeletal: Normal range of motion. He exhibits no edema or tenderness.  Neurological: He is alert and oriented to person, place, and time.  Psychiatric: He has a normal mood and affect. His behavior is normal.          Assessment & Plan:   Essential hypertension.  Compliance with his medications encouraged.  Will increase amlodipine to 5 mg daily.  Recheck 6 weeks Chronic kidney disease stable  Marletta Lor

## 2017-11-25 NOTE — Patient Instructions (Signed)
Limit your sodium (Salt) intake  Increase amlodipine from 2.5 mg to 5 mg daily  Return in 6 weeks for follow-up  Please take all your medications faithfully

## 2017-11-28 ENCOUNTER — Telehealth: Payer: Self-pay | Admitting: Internal Medicine

## 2017-11-28 NOTE — Telephone Encounter (Signed)
Called pharmacy and phoned Rx in on 11/28/2017. Pharmacist stated that they did not received the Rx on 11/05/2017.

## 2017-11-28 NOTE — Telephone Encounter (Signed)
Copied from [place] [id number]. Topic: Quick Communication - See Telephone Encounter >> Nov 28, 2017  2:07 PM Conception Chancy, NT wrote: CRM for notification. See Telephone encounter for: 11/28/17.  Patient is calling and states the pharmacy did not receive amLODipine (NORVASC) 5 MG tablet. Please advise.   [facility] [phone] - [address], [address] [place] [address] Phone: [phone] Fax: [phone]

## 2018-01-06 ENCOUNTER — Ambulatory Visit (INDEPENDENT_AMBULATORY_CARE_PROVIDER_SITE_OTHER): Payer: Medicare Other | Admitting: Internal Medicine

## 2018-01-06 ENCOUNTER — Encounter: Payer: Self-pay | Admitting: Internal Medicine

## 2018-01-06 VITALS — BP 180/78 | HR 83 | Temp 98.0°F | Wt 176.4 lb

## 2018-01-06 DIAGNOSIS — N183 Chronic kidney disease, stage 3 unspecified: Secondary | ICD-10-CM

## 2018-01-06 DIAGNOSIS — E785 Hyperlipidemia, unspecified: Secondary | ICD-10-CM

## 2018-01-06 DIAGNOSIS — I10 Essential (primary) hypertension: Secondary | ICD-10-CM | POA: Diagnosis not present

## 2018-01-06 MED ORDER — FUROSEMIDE 40 MG PO TABS: ORAL_TABLET | ORAL | 3 refills | Status: DC

## 2018-01-06 MED ORDER — LOSARTAN POTASSIUM 100 MG PO TABS: 100.0000 mg | ORAL_TABLET | Freq: Every day | ORAL | 3 refills | Status: DC

## 2018-01-06 MED ORDER — ALLOPURINOL 100 MG PO TABS: 200.0000 mg | ORAL_TABLET | Freq: Every day | ORAL | 6 refills | Status: DC

## 2018-01-06 MED ORDER — SIMVASTATIN 40 MG PO TABS: 20.0000 mg | ORAL_TABLET | Freq: Every day | ORAL | 3 refills | Status: DC

## 2018-01-06 MED ORDER — AMLODIPINE BESYLATE 5 MG PO TABS: 5.0000 mg | ORAL_TABLET | Freq: Every day | ORAL | 2 refills | Status: DC

## 2018-01-06 NOTE — Patient Instructions (Signed)
Limit your sodium (Salt) intake  Please check your blood pressure on a regular basis.  If it is consistently greater than 150/90, please make an office appointment.  Please take all your medications faithfully  Return in 3 months for follow-up

## 2018-01-06 NOTE — Progress Notes (Signed)
Subjective:    Patient ID: Isaac Phelps, male    DOB: 1931-04-12, 84 y.o.   MRN: [id number]  HPI  84 year old patient who is seen today for follow-up of essential hypertension.  He has history of chronic kidney disease and gout which has been stable. He states that he is generally been compliant with his medications although states that he did not take his medications today  Past Medical History:  Diagnosis Date  . Anxiety   . BENIGN PROSTATIC HYPERTROPHY 04/01/2008  . Coronary artery disease   . GERD (gastroesophageal reflux disease)   . HYPERLIPIDEMIA 05/26/2007  . HYPERTENSION 02/07/2007  . Syncope and collapse   . TOBACCO ABUSE 04/01/2008     Social History   Socioeconomic History  . Marital status: Married    Spouse name: Not on file  . Number of children: Not on file  . Years of education: Not on file  . Highest education level: Not on file  Occupational History  . Not on file  Social Needs  . Financial resource strain: Not on file  . Food insecurity:    Worry: Not on file    Inability: Not on file  . Transportation needs:    Medical: Not on file    Non-medical: Not on file  Tobacco Use  . Smoking status: Current Every Day Smoker    Packs/day: 0.30    Types: Cigarettes  . Smokeless tobacco: Never Used  Substance and Sexual Activity  . Alcohol use: No  . Drug use: No  . Sexual activity: Not on file  Lifestyle  . Physical activity:    Days per week: Not on file    Minutes per session: Not on file  . Stress: Not on file  Relationships  . Social connections:    Talks on phone: Not on file    Gets together: Not on file    Attends religious service: Not on file    Active member of club or organization: Not on file    Attends meetings of clubs or organizations: Not on file    Relationship status: Not on file  . Intimate partner violence:    Fear of current or ex partner: Not on file    Emotionally abused: Not on file    Physically abused: Not on file    Forced sexual activity: Not on file  Other Topics Concern  . Not on file  Social History Narrative  . Not on file    Past Surgical History:  Procedure Laterality Date  . APPENDECTOMY    . CORONARY ANGIOPLASTY    . KNEE ARTHROSCOPY    . TONSILLECTOMY    . TONSILLECTOMY      Family History  Problem Relation Age of Onset  . Arthritis Neg Hx        family  . Hypertension Neg Hx        family  . Stroke Neg Hx        family , 1st degree relative    Allergies  Allergen Reactions  . Sulfa Antibiotics     Current Outpatient Medications on File Prior to Visit  Medication Sig Dispense Refill  . clobetasol ointment (TEMOVATE) 7.56 % Apply 1 application topically 2 (two) times daily.   0   No current facility-administered medications on file prior to visit.     BP (!) 180/78 (BP Location: Right Arm, Patient Position: Sitting, Cuff Size: Large)   Pulse 83   Temp 98 F (36.7  C)   Wt 176 lb 6.4 oz (80 kg)   SpO2 98%   BMI 25.31 kg/m     Review of Systems  Constitutional: Negative for appetite change, chills, fatigue and fever.  HENT: Negative for congestion, dental problem, ear pain, hearing loss, sore throat, tinnitus, trouble swallowing and voice change.   Eyes: Negative for pain, discharge and visual disturbance.  Respiratory: Negative for cough, chest tightness, wheezing and stridor.   Cardiovascular: Positive for leg swelling. Negative for chest pain and palpitations.       Some chronic left ankle swelling  Gastrointestinal: Negative for abdominal distention, abdominal pain, blood in stool, constipation, diarrhea, nausea and vomiting.  Genitourinary: Negative for difficulty urinating, discharge, flank pain, genital sores, hematuria and urgency.  Musculoskeletal: Negative for arthralgias, back pain, gait problem, joint swelling, myalgias and neck stiffness.  Skin: Negative for rash.  Neurological: Negative for dizziness, syncope, speech difficulty, weakness, numbness  and headaches.  Hematological: Negative for adenopathy. Does not bruise/bleed easily.  Psychiatric/Behavioral: Negative for behavioral problems and dysphoric mood. The patient is not nervous/anxious.        Objective:   Physical Exam  Constitutional: He is oriented to person, place, and time. He appears well-developed.  Blood pressure 150/80  HENT:  Head: Normocephalic.  Right Ear: External ear normal.  Left Ear: External ear normal.  Eyes: Conjunctivae and EOM are normal.  Neck: Normal range of motion.  Cardiovascular: Normal rate and normal heart sounds.  Pulmonary/Chest: Breath sounds normal.  Abdominal: Bowel sounds are normal.  Musculoskeletal: Normal range of motion. He exhibits no edema or tenderness.  Neurological: He is alert and oriented to person, place, and time.  Psychiatric: He has a normal mood and affect. His behavior is normal.          Assessment & Plan:   Essential hypertension.  Compliance stressed low-salt diet recommended Chronic kidney disease  Dyslipidemia  No change in medical regimen Follow-up 3 months

## 2018-01-06 NOTE — Progress Notes (Signed)
   Subjective:    Patient ID: Isaac Phelps, male    DOB: 21-Jun-1930, 84 y.o.   MRN: [id number]  HPI    Review of Systems     Objective:   Physical Exam        Assessment & Plan:

## 2018-04-15 ENCOUNTER — Encounter: Payer: Self-pay | Admitting: Adult Health

## 2018-04-15 ENCOUNTER — Other Ambulatory Visit: Payer: Self-pay | Admitting: Internal Medicine

## 2018-04-15 ENCOUNTER — Ambulatory Visit (INDEPENDENT_AMBULATORY_CARE_PROVIDER_SITE_OTHER): Payer: Medicare Other | Admitting: Adult Health

## 2018-04-15 VITALS — BP 170/60 | HR 76 | Temp 98.1°F | Wt 181.8 lb

## 2018-04-15 DIAGNOSIS — E785 Hyperlipidemia, unspecified: Secondary | ICD-10-CM

## 2018-04-15 DIAGNOSIS — N183 Chronic kidney disease, stage 3 unspecified: Secondary | ICD-10-CM

## 2018-04-15 DIAGNOSIS — I10 Essential (primary) hypertension: Secondary | ICD-10-CM

## 2018-04-15 DIAGNOSIS — F17200 Nicotine dependence, unspecified, uncomplicated: Secondary | ICD-10-CM

## 2018-04-15 DIAGNOSIS — Z7689 Persons encountering health services in other specified circumstances: Secondary | ICD-10-CM | POA: Diagnosis not present

## 2018-04-15 DIAGNOSIS — L989 Disorder of the skin and subcutaneous tissue, unspecified: Secondary | ICD-10-CM

## 2018-04-15 MED ORDER — DOXYCYCLINE HYCLATE 50 MG PO CAPS: 50.0000 mg | ORAL_CAPSULE | Freq: Two times a day (BID) | ORAL | 0 refills | Status: DC

## 2018-04-15 MED ORDER — AMLODIPINE BESYLATE 10 MG PO TABS: 10.0000 mg | ORAL_TABLET | Freq: Every day | ORAL | 3 refills | Status: DC

## 2018-04-15 NOTE — Patient Instructions (Addendum)
It was great seeing you today   I have sent in a new prescription for Norvasc - blood pressure medication   I have also prescribed Doxycycline ( antibiotic) for your skin condition. We will follow up after christmas about this issue   Please follow up after the holidays for your physical exam. Please let me know if you need anything prior to this

## 2018-04-15 NOTE — Progress Notes (Signed)
Patient presents to clinic today to establish care. He is a pleasant 83 year old male who  has a past medical history of Anxiety, BENIGN PROSTATIC HYPERTROPHY (04/01/2008), Coronary artery disease, GERD (gastroesophageal reflux disease), HYPERLIPIDEMIA (05/26/2007), HYPERTENSION (02/07/2007), Syncope and collapse, and TOBACCO ABUSE (04/01/2008).   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   He is a former patient of Dr Raliegh Ip, who I am seeing today to transfer care  His last CPE was in September 2018   He is also managed at the [place]   Acute Concerns: Establish Care  Abdominal rash - has been present for " a lot of years" Has been seen by dermatology at the [facility] who prescribed a steroid cream. He reports that this cream is doing nothing to help his skin condition   Chronic Issues: Essential Hypertension - Takes Norvasc 5 mg and Cozaar 100 mg BP Readings from Last 3 Encounters:  04/15/18 (!) 170/60  01/06/18 (!) 180/78  11/25/17 (!) 170/80   Hyperlipidemia - Takes Zocor 40 mg daily  Lab Results  Component Value Date   CHOL 142 02/18/2017   HDL 46.10 02/18/2017   LDLCALC 84 02/18/2017   LDLDIRECT 151.4 05/26/2007   TRIG 59.0 02/18/2017   CHOLHDL 3 02/18/2017   CKD - stable  Lab Results  Component Value Date   CREATININE 1.53 (H) 10/22/2017   BUN 24 (H) 10/22/2017   NA 141 10/22/2017   K 4.0 10/22/2017   CL 107 10/22/2017   CO2 28 10/22/2017   H/o Gout - well controlled on Allopurinol 100 mg daily. No recent gout flares.   Tobacco Use - Smokes every day. Smokes about 10 cigarettes per day. He does not want to quit at this time.   Health Maintenance: Dental -- Does not do routine care.  Vision -- Routine Care  Immunizations -- UTD Colonoscopy -- No longer needs   Past Medical History:  Diagnosis Date  . Anxiety   . BENIGN PROSTATIC HYPERTROPHY 04/01/2008  . Coronary artery disease   . GERD (gastroesophageal reflux disease)   .  HYPERLIPIDEMIA 05/26/2007  . HYPERTENSION 02/07/2007  . Syncope and collapse   . TOBACCO ABUSE 04/01/2008    Past Surgical History:  Procedure Laterality Date  . APPENDECTOMY    . CORONARY ANGIOPLASTY    . KNEE ARTHROSCOPY    . TONSILLECTOMY    . TONSILLECTOMY      Current Outpatient Medications on File Prior to Visit  Medication Sig Dispense Refill  . allopurinol (ZYLOPRIM) 100 MG tablet Take 2 tablets (200 mg total) by mouth daily. 180 tablet 6  . amLODipine (NORVASC) 5 MG tablet Take 1 tablet (5 mg total) by mouth daily. 90 tablet 2  . clobetasol ointment (TEMOVATE) 1.54 % Apply 1 application topically 2 (two) times daily.   0  . furosemide (LASIX) 40 MG tablet take 1 tablet by mouth once daily if needed for SWELLING OF FEET OR ANKLES 90 tablet 3  . losartan (COZAAR) 100 MG tablet Take 1 tablet (100 mg total) by mouth daily. 90 tablet 3  . simvastatin (ZOCOR) 40 MG tablet Take 0.5 tablets (20 mg total) by mouth daily. 90 tablet 3   No current facility-administered medications on file prior to visit.     Allergies  Allergen Reactions  . Sulfa Antibiotics     Family History  Problem Relation Age of Onset  . Arthritis Neg Hx  family  . Hypertension Neg Hx        family  . Stroke Neg Hx        family , 1st degree relative    Social History   Socioeconomic History  . Marital status: Married    Spouse name: Not on file  . Number of children: Not on file  . Years of education: Not on file  . Highest education level: Not on file  Occupational History  . Not on file  Social Needs  . Financial resource strain: Not on file  . Food insecurity:    Worry: Not on file    Inability: Not on file  . Transportation needs:    Medical: Not on file    Non-medical: Not on file  Tobacco Use  . Smoking status: Current Every Day Smoker    Packs/day: 0.30    Types: Cigarettes  . Smokeless tobacco: Never Used  Substance and Sexual Activity  . Alcohol use: No  . Drug use:  No  . Sexual activity: Not on file  Lifestyle  . Physical activity:    Days per week: Not on file    Minutes per session: Not on file  . Stress: Not on file  Relationships  . Social connections:    Talks on phone: Not on file    Gets together: Not on file    Attends religious service: Not on file    Active member of club or organization: Not on file    Attends meetings of clubs or organizations: Not on file    Relationship status: Not on file  . Intimate partner violence:    Fear of current or ex partner: Not on file    Emotionally abused: Not on file    Physically abused: Not on file    Forced sexual activity: Not on file  Other Topics Concern  . Not on file  Social History Narrative  . Not on file    ROS  BP (!) 170/60 (BP Location: Left Arm, Patient Position: Sitting, Cuff Size: Normal)   Pulse 76   Temp 98.1 F (36.7 C) (Oral)   Wt 181 lb 12.8 oz (82.5 kg)   SpO2 100%   BMI 26.09 kg/m   Physical Exam  Constitutional: He is oriented to person, place, and time. He appears well-developed and well-nourished. No distress.  Cardiovascular: Normal rate, regular rhythm, normal heart sounds and intact distal pulses.  Pulmonary/Chest: Effort normal and breath sounds normal.  Musculoskeletal: Normal range of motion. He exhibits no edema or deformity.  Neurological: He is alert and oriented to person, place, and time.  Skin: Skin is warm and dry. He is not diaphoretic.  Large amount of blackheads and small non fluctuant abscesses noted throughout torso.   Nursing note and vitals reviewed.  Assessment/Plan: 1. Encounter to establish care - Follow up in the next 1-2 months for CPE or sooner if needed - Encouraged to quit smoking   2. CKD (chronic kidney disease) stage 3, GFR 30-59 ml/min (HCC) - Stable. Will continue to monitor   3. TOBACCO ABUSE - Encouraged to quit   4. Dyslipidemia - Continue statin   5. Essential hypertension - Not at goal. Will increase  Norvasc to 10 mg.  - Follow up as at CPE  - amLODipine (NORVASC) 10 MG tablet; Take 1 tablet (10 mg total) by mouth daily.  Dispense: 90 tablet; Refill: 3  6. Skin disorder - Will prescribe low dose doxycycline for acne. Reevaluate  at CPE  - doxycycline (VIBRAMYCIN) 50 MG capsule; Take 1 capsule (50 mg total) by mouth 2 (two) times daily.  Dispense: 180 capsule; Refill: 0  Dorothyann Peng, NP

## 2018-04-22 ENCOUNTER — Encounter: Payer: Medicare Other | Admitting: Internal Medicine

## 2018-04-23 ENCOUNTER — Other Ambulatory Visit: Payer: Self-pay

## 2018-06-06 ENCOUNTER — Encounter

## 2018-06-06 ENCOUNTER — Ambulatory Visit (INDEPENDENT_AMBULATORY_CARE_PROVIDER_SITE_OTHER): Payer: Medicare Other | Admitting: Adult Health

## 2018-06-06 ENCOUNTER — Encounter: Payer: Self-pay | Admitting: Adult Health

## 2018-06-06 VITALS — BP 156/64 | Temp 98.4°F | Wt 188.0 lb

## 2018-06-06 DIAGNOSIS — R600 Localized edema: Secondary | ICD-10-CM | POA: Diagnosis not present

## 2018-06-06 LAB — CBC WITH DIFFERENTIAL/PLATELET
Basophils Absolute: 0.1 10*3/uL (ref 0.0–0.1)
Basophils Relative: 1 % (ref 0.0–3.0)
Eosinophils Absolute: 0.2 10*3/uL (ref 0.0–0.7)
Eosinophils Relative: 2.6 % (ref 0.0–5.0)
HCT: 34 % — ABNORMAL LOW (ref 39.0–52.0)
Hemoglobin: 11 g/dL — ABNORMAL LOW (ref 13.0–17.0)
Lymphocytes Relative: 14.9 % (ref 12.0–46.0)
Lymphs Abs: 1 10*3/uL (ref 0.7–4.0)
MCHC: 32.5 g/dL (ref 30.0–36.0)
MCV: 82.3 fl (ref 78.0–100.0)
Monocytes Absolute: 0.5 10*3/uL (ref 0.1–1.0)
Monocytes Relative: 7.4 % (ref 3.0–12.0)
NEUTROS PCT: 74.1 % (ref 43.0–77.0)
Neutro Abs: 4.9 10*3/uL (ref 1.4–7.7)
Platelets: 228 10*3/uL (ref 150.0–400.0)
RBC: 4.13 Mil/uL — ABNORMAL LOW (ref 4.22–5.81)
RDW: 16.5 % — ABNORMAL HIGH (ref 11.5–15.5)
WBC: 6.7 10*3/uL (ref 4.0–10.5)

## 2018-06-06 LAB — BASIC METABOLIC PANEL
BUN: 48 mg/dL — ABNORMAL HIGH (ref 6–23)
CALCIUM: 9.3 mg/dL (ref 8.4–10.5)
CO2: 26 mEq/L (ref 19–32)
Chloride: 102 mEq/L (ref 96–112)
Creatinine, Ser: 2.08 mg/dL — ABNORMAL HIGH (ref 0.40–1.50)
GFR: 39.19 mL/min — AB (ref >60.00)
GLUCOSE: 96 mg/dL (ref 70–99)
POTASSIUM: 3.5 meq/L (ref 3.5–5.1)
Sodium: 139 mEq/L (ref 135–145)

## 2018-06-06 LAB — BRAIN NATRIURETIC PEPTIDE: PRO B NATRI PEPTIDE: 15 pg/mL (ref 0.0–100.0)

## 2018-06-06 LAB — URIC ACID: Uric Acid, Serum: 5.1 mg/dL (ref 4.0–7.8)

## 2018-06-06 MED ORDER — LOSARTAN POTASSIUM 100 MG PO TABS: 100.0000 mg | ORAL_TABLET | Freq: Every day | ORAL | 3 refills | Status: DC

## 2018-06-06 MED ORDER — SIMVASTATIN 40 MG PO TABS: 20.0000 mg | ORAL_TABLET | Freq: Every day | ORAL | 3 refills | Status: DC

## 2018-06-06 NOTE — Progress Notes (Signed)
Subjective:    Patient ID: Isaac Phelps, male    DOB: [birth year], 85 y.o.   MRN: [id number]  HPI 85 year old male who  has a past medical history of Anxiety, BENIGN PROSTATIC HYPERTROPHY (04/01/2008), Coronary artery disease, GERD (gastroesophageal reflux disease), HYPERLIPIDEMIA (05/26/2007), HYPERTENSION (02/07/2007), Syncope and collapse, and TOBACCO ABUSE (04/01/2008).  He presents to the office today for concern of lower extremity edema. He reports that he has had lower extremity edema in bilateral legs for " a long time". He is currently prescribed Lasix 40 mg. He reports that over the last few weeks his lower extremity swelling has increased. Throughout the week he has increased is lasix at home from 40 mg daily to 80 mg BID. He has not noticed any improvement in his edema.   He finds it difficult to wear socks or shoes. He does report pain throughout bilateral feet when he walks.   He denies any redness, warmth, or calf tenderness. He has not been experiencing any shortness of breath or chest pain    Review of Systems See HPI   Past Medical History:  Diagnosis Date  . Anxiety   . BENIGN PROSTATIC HYPERTROPHY 04/01/2008  . Coronary artery disease   . GERD (gastroesophageal reflux disease)   . HYPERLIPIDEMIA 05/26/2007  . HYPERTENSION 02/07/2007  . Syncope and collapse   . TOBACCO ABUSE 04/01/2008    Social History   Socioeconomic History  . Marital status: Married    Spouse name: Not on file  . Number of children: Not on file  . Years of education: Not on file  . Highest education level: Not on file  Occupational History  . Not on file  Social Needs  . Financial resource strain: Not on file  . Food insecurity:    Worry: Not on file    Inability: Not on file  . Transportation needs:    Medical: Not on file    Non-medical: Not on file  Tobacco Use  . Smoking status: Current Every Day Smoker    Packs/day: 0.30    Types: Cigarettes  . Smokeless tobacco: Never Used   Substance and Sexual Activity  . Alcohol use: No  . Drug use: No  . Sexual activity: Not on file  Lifestyle  . Physical activity:    Days per week: Not on file    Minutes per session: Not on file  . Stress: Not on file  Relationships  . Social connections:    Talks on phone: Not on file    Gets together: Not on file    Attends religious service: Not on file    Active member of club or organization: Not on file    Attends meetings of clubs or organizations: Not on file    Relationship status: Not on file  . Intimate partner violence:    Fear of current or ex partner: Not on file    Emotionally abused: Not on file    Physically abused: Not on file    Forced sexual activity: Not on file  Other Topics Concern  . Not on file  Social History Narrative  . Not on file    Past Surgical History:  Procedure Laterality Date  . APPENDECTOMY    . TONSILLECTOMY      Family History  Problem Relation Age of Onset  . Arthritis Neg Hx        family  . Hypertension Neg Hx        family  .  Stroke Neg Hx        family , 1st degree relative    Allergies  Allergen Reactions  . Sulfa Antibiotics     Current Outpatient Medications on File Prior to Visit  Medication Sig Dispense Refill  . allopurinol (ZYLOPRIM) 100 MG tablet TAKE 2 TABLETS BY MOUTH ONCE DAILY 180 tablet 0  . amLODipine (NORVASC) 10 MG tablet Take 1 tablet (10 mg total) by mouth daily. 90 tablet 3  . clobetasol ointment (TEMOVATE) 1.61 % Apply 1 application topically 2 (two) times daily.   0  . furosemide (LASIX) 40 MG tablet take 1 tablet by mouth once daily if needed for SWELLING OF FEET OR ANKLES 90 tablet 3   No current facility-administered medications on file prior to visit.     BP (!) 156/64   Temp 98.4 F (36.9 C)   Wt 188 lb (85.3 kg)   BMI 26.98 kg/m       Objective:   Physical Exam Vitals signs reviewed.  Constitutional:      Appearance: Normal appearance.  Cardiovascular:     Rate and  Rhythm: Normal rate and regular rhythm.     Pulses: Normal pulses.     Heart sounds: Normal heart sounds.  Pulmonary:     Effort: Pulmonary effort is normal.     Breath sounds: Normal breath sounds.  Musculoskeletal: Normal range of motion.     Right lower leg: Edema present.     Left lower leg: Edema present.     Comments: Significant edema noted to bilateral lower extremities. Edema extends from feet above ankles.  No signs of gout No calf pain, tenderness or warmth noted.   Skin:    General: Skin is warm and dry.  Neurological:     General: No focal deficit present.     Mental Status: He is alert. Mental status is at baseline.       Assessment & Plan:  1. Lower extremity edema - Will check labs. Consider strong diuretic. Educated on harm from taking too much lasix.  - Elevate legs at night  - Not concerned for DVT at this point - Basic Metabolic Panel - Brain Natriuretic Peptide - CBC with Differential/Platelet - Uric Acid   Dorothyann Peng, NP

## 2018-06-17 ENCOUNTER — Encounter: Payer: Self-pay | Admitting: Adult Health

## 2018-06-17 ENCOUNTER — Ambulatory Visit (INDEPENDENT_AMBULATORY_CARE_PROVIDER_SITE_OTHER): Payer: Medicare Other | Admitting: Adult Health

## 2018-06-17 VITALS — BP 152/62 | Temp 98.8°F | Wt 187.0 lb

## 2018-06-17 DIAGNOSIS — R600 Localized edema: Secondary | ICD-10-CM

## 2018-06-17 LAB — BASIC METABOLIC PANEL
BUN: 39 mg/dL — ABNORMAL HIGH (ref 6–23)
CO2: 25 mEq/L (ref 19–32)
CREATININE: 2.13 mg/dL — AB (ref 0.40–1.50)
Calcium: 9.4 mg/dL (ref 8.4–10.5)
Chloride: 103 mEq/L (ref 96–112)
GFR: 38.13 mL/min — ABNORMAL LOW (ref >60.00)
GLUCOSE: 102 mg/dL — AB (ref 70–99)
Potassium: 3.6 mEq/L (ref 3.5–5.1)
Sodium: 139 mEq/L (ref 135–145)

## 2018-06-17 LAB — URIC ACID: Uric Acid, Serum: 4.3 mg/dL (ref 4.0–7.8)

## 2018-06-17 MED ORDER — TORSEMIDE 20 MG PO TABS: 20.0000 mg | ORAL_TABLET | Freq: Every day | ORAL | 0 refills | Status: DC

## 2018-06-17 MED ORDER — POTASSIUM CHLORIDE ER 10 MEQ PO TBCR: 10.0000 meq | EXTENDED_RELEASE_TABLET | Freq: Every day | ORAL | 0 refills | Status: DC

## 2018-06-17 NOTE — Patient Instructions (Signed)
It was great seeing you today   I have changed your diuretic. Stop lasix and start taking Demadex. I have also sent a potassium supplement in to take with this diuretic  Please follow up in one week

## 2018-06-17 NOTE — Progress Notes (Signed)
Subjective:    Patient ID: Isaac Phelps, male    DOB: 12-19-1930, 85 y.o.   MRN: [id number]  HPI  85 year old male who  has a past medical history of Anxiety, BENIGN PROSTATIC HYPERTROPHY (04/01/2008), Coronary artery disease, GERD (gastroesophageal reflux disease), HYPERLIPIDEMIA (05/26/2007), HYPERTENSION (02/07/2007), Syncope and collapse, and TOBACCO ABUSE (04/01/2008).  He presents to the office today for follow up regarding lower extremity edema. He was seen last on 06/06/2017 and he had been titrating lasix at home due to increased lower extremity edema. BMP showed decreased kidney function with a GFR of 39.19 and Cr. 2.08. K levels were normal.   We placed him back on his original dose of lasix and he was advised to elevate legs at night and decrease sodium intake.   He reports that he has had no change in the amount of swelling and feels as though it continues to get worse. He denies chest pain or SOB   Review of Systems See HPI   Past Medical History:  Diagnosis Date  . Anxiety   . BENIGN PROSTATIC HYPERTROPHY 04/01/2008  . Coronary artery disease   . GERD (gastroesophageal reflux disease)   . HYPERLIPIDEMIA 05/26/2007  . HYPERTENSION 02/07/2007  . Syncope and collapse   . TOBACCO ABUSE 04/01/2008    Social History   Socioeconomic History  . Marital status: Married    Spouse name: Not on file  . Number of children: Not on file  . Years of education: Not on file  . Highest education level: Not on file  Occupational History  . Not on file  Social Needs  . Financial resource strain: Not on file  . Food insecurity:    Worry: Not on file    Inability: Not on file  . Transportation needs:    Medical: Not on file    Non-medical: Not on file  Tobacco Use  . Smoking status: Current Every Day Smoker    Packs/day: 0.30    Types: Cigarettes  . Smokeless tobacco: Never Used  Substance and Sexual Activity  . Alcohol use: No  . Drug use: No  . Sexual activity: Not on  file  Lifestyle  . Physical activity:    Days per week: Not on file    Minutes per session: Not on file  . Stress: Not on file  Relationships  . Social connections:    Talks on phone: Not on file    Gets together: Not on file    Attends religious service: Not on file    Active member of club or organization: Not on file    Attends meetings of clubs or organizations: Not on file    Relationship status: Not on file  . Intimate partner violence:    Fear of current or ex partner: Not on file    Emotionally abused: Not on file    Physically abused: Not on file    Forced sexual activity: Not on file  Other Topics Concern  . Not on file  Social History Narrative  . Not on file    Past Surgical History:  Procedure Laterality Date  . APPENDECTOMY    . TONSILLECTOMY      Family History  Problem Relation Age of Onset  . Arthritis Neg Hx        family  . Hypertension Neg Hx        family  . Stroke Neg Hx        family , 1st  degree relative    Allergies  Allergen Reactions  . Sulfa Antibiotics     Current Outpatient Medications on File Prior to Visit  Medication Sig Dispense Refill  . allopurinol (ZYLOPRIM) 100 MG tablet TAKE 2 TABLETS BY MOUTH ONCE DAILY 180 tablet 0  . amLODipine (NORVASC) 10 MG tablet Take 1 tablet (10 mg total) by mouth daily. 90 tablet 3  . clobetasol ointment (TEMOVATE) 1.30 % Apply 1 application topically 2 (two) times daily.   0  . furosemide (LASIX) 40 MG tablet take 1 tablet by mouth once daily if needed for SWELLING OF FEET OR ANKLES 90 tablet 3  . losartan (COZAAR) 100 MG tablet Take 1 tablet (100 mg total) by mouth daily. 90 tablet 3  . simvastatin (ZOCOR) 40 MG tablet Take 0.5 tablets (20 mg total) by mouth daily. 90 tablet 3   No current facility-administered medications on file prior to visit.     BP (!) 152/62   Temp 98.8 F (37.1 C)   Wt 187 lb (84.8 kg)   BMI 26.83 kg/m       Objective:   Physical Exam Vitals signs and  nursing note reviewed.  Constitutional:      Appearance: Normal appearance.  Cardiovascular:     Rate and Rhythm: Normal rate and regular rhythm.     Pulses: Normal pulses.     Heart sounds: Normal heart sounds.  Pulmonary:     Effort: Pulmonary effort is normal.     Breath sounds: Normal breath sounds.  Musculoskeletal:        General: Swelling and tenderness present.     Right lower leg: Edema present.     Left lower leg: Edema present.  Skin:    General: Skin is warm and dry.  Neurological:     General: No focal deficit present.     Mental Status: He is alert and oriented to person, place, and time. Mental status is at baseline.       Assessment & Plan:  1. Lower extremity edema - d/c lasix  - Will switch for torsemide  - torsemide (DEMADEX) 20 MG tablet; Take 1 tablet (20 mg total) by mouth daily for 30 days.  Dispense: 30 tablet; Refill: 0 - Basic Metabolic Panel - potassium chloride (K-DUR) 10 MEQ tablet; Take 1 tablet (10 mEq total) by mouth daily for 30 days.  Dispense: 30 tablet; Refill: 0 - Uric Acid - One week follow up   Dorothyann Peng, NP

## 2018-06-24 ENCOUNTER — Encounter: Payer: Medicare Other | Admitting: Adult Health

## 2018-06-27 ENCOUNTER — Encounter: Payer: Self-pay | Admitting: Adult Health

## 2018-06-27 ENCOUNTER — Ambulatory Visit (INDEPENDENT_AMBULATORY_CARE_PROVIDER_SITE_OTHER): Payer: Medicare Other | Admitting: Adult Health

## 2018-06-27 VITALS — BP 140/64 | HR 73 | Temp 98.6°F | Ht 70.0 in | Wt 188.4 lb

## 2018-06-27 DIAGNOSIS — R600 Localized edema: Secondary | ICD-10-CM | POA: Diagnosis not present

## 2018-06-27 LAB — BASIC METABOLIC PANEL
BUN: 43 mg/dL — AB (ref 6–23)
CO2: 28 mEq/L (ref 19–32)
Calcium: 9.3 mg/dL (ref 8.4–10.5)
Chloride: 105 mEq/L (ref 96–112)
Creatinine, Ser: 2.15 mg/dL — ABNORMAL HIGH (ref 0.40–1.50)
GFR: 35.49 mL/min — ABNORMAL LOW (ref >60.00)
Glucose, Bld: 82 mg/dL (ref 70–99)
POTASSIUM: 4.3 meq/L (ref 3.5–5.1)
Sodium: 142 mEq/L (ref 135–145)

## 2018-06-27 NOTE — Progress Notes (Signed)
Subjective:    Patient ID: Isaac Phelps, male    DOB: 1930-07-12, 85 y.o.   MRN: [id number]  HPI 85 year old male who  has a past medical history of Anxiety, BENIGN PROSTATIC HYPERTROPHY (04/01/2008), Coronary artery disease, GERD (gastroesophageal reflux disease), HYPERLIPIDEMIA (05/26/2007), HYPERTENSION (02/07/2007), Syncope and collapse, and TOBACCO ABUSE (04/01/2008).  He presents to the office today for one week follow up regarding lower extremity edema. During the last visit Lasix was changed to Torsemide.   Today in the office he reports that he is urinating more but does not feel as though the edema has improved. He continues to have to wear sandals as he can not wear shoes.  Denies chest pain or SOB    Review of Systems See HPI   Past Medical History:  Diagnosis Date  . Anxiety   . BENIGN PROSTATIC HYPERTROPHY 04/01/2008  . Coronary artery disease   . GERD (gastroesophageal reflux disease)   . HYPERLIPIDEMIA 05/26/2007  . HYPERTENSION 02/07/2007  . Syncope and collapse   . TOBACCO ABUSE 04/01/2008    Social History   Socioeconomic History  . Marital status: Married    Spouse name: Not on file  . Number of children: Not on file  . Years of education: Not on file  . Highest education level: Not on file  Occupational History  . Not on file  Social Needs  . Financial resource strain: Not on file  . Food insecurity:    Worry: Not on file    Inability: Not on file  . Transportation needs:    Medical: Not on file    Non-medical: Not on file  Tobacco Use  . Smoking status: Current Every Day Smoker    Packs/day: 0.30    Types: Cigarettes  . Smokeless tobacco: Never Used  Substance and Sexual Activity  . Alcohol use: No  . Drug use: No  . Sexual activity: Not on file  Lifestyle  . Physical activity:    Days per week: Not on file    Minutes per session: Not on file  . Stress: Not on file  Relationships  . Social connections:    Talks on phone: Not on file      Gets together: Not on file    Attends religious service: Not on file    Active member of club or organization: Not on file    Attends meetings of clubs or organizations: Not on file    Relationship status: Not on file  . Intimate partner violence:    Fear of current or ex partner: Not on file    Emotionally abused: Not on file    Physically abused: Not on file    Forced sexual activity: Not on file  Other Topics Concern  . Not on file  Social History Narrative  . Not on file    Past Surgical History:  Procedure Laterality Date  . APPENDECTOMY    . TONSILLECTOMY      Family History  Problem Relation Age of Onset  . Arthritis Neg Hx        family  . Hypertension Neg Hx        family  . Stroke Neg Hx        family , 1st degree relative    Allergies  Allergen Reactions  . Sulfa Antibiotics     Current Outpatient Medications on File Prior to Visit  Medication Sig Dispense Refill  . allopurinol (ZYLOPRIM) 100 MG tablet TAKE  2 TABLETS BY MOUTH ONCE DAILY 180 tablet 0  . amLODipine (NORVASC) 10 MG tablet Take 1 tablet (10 mg total) by mouth daily. 90 tablet 3  . clobetasol ointment (TEMOVATE) 7.56 % Apply 1 application topically 2 (two) times daily.   0  . losartan (COZAAR) 100 MG tablet Take 1 tablet (100 mg total) by mouth daily. 90 tablet 3  . potassium chloride (K-DUR) 10 MEQ tablet Take 1 tablet (10 mEq total) by mouth daily for 30 days. 30 tablet 0  . simvastatin (ZOCOR) 40 MG tablet Take 0.5 tablets (20 mg total) by mouth daily. 90 tablet 3  . torsemide (DEMADEX) 20 MG tablet Take 1 tablet (20 mg total) by mouth daily for 30 days. 30 tablet 0   No current facility-administered medications on file prior to visit.     BP 140/64 (BP Location: Left Arm, Patient Position: Sitting, Cuff Size: Normal)   Pulse 73   Temp 98.6 F (37 C) (Oral)   Ht 5\' 10"  (1.778 m)   Wt 188 lb 6.4 oz (85.5 kg)   SpO2 96%   BMI 27.03 kg/m       Objective:   Physical Exam Vitals  signs and nursing note reviewed.  Constitutional:      Appearance: Normal appearance.  Cardiovascular:     Rate and Rhythm: Normal rate and regular rhythm.     Pulses: Normal pulses.     Heart sounds: Normal heart sounds.  Pulmonary:     Effort: Pulmonary effort is normal.     Breath sounds: Normal breath sounds.  Musculoskeletal:     Right lower leg: Edema present.     Left lower leg: Edema present.     Comments: Continues to have + 2 pitting edema in bilateral lower extremities. Seems to have improved slightly.   Skin:    General: Skin is warm and dry.  Neurological:     Mental Status: He is alert.       Assessment & Plan:  1. Lower extremity edema - Will refer to vascular for further evaluation  - Advised to elevate legs when at rest and low sodium diet  - Continue with Torsemide for the time being  - Ambulatory referral to Vascular Surgery - Basic Metabolic Panel  Dorothyann Peng, NP

## 2018-07-02 ENCOUNTER — Encounter: Payer: Medicare Other | Admitting: Adult Health

## 2018-07-14 ENCOUNTER — Telehealth: Payer: Self-pay

## 2018-07-14 NOTE — Telephone Encounter (Signed)
Copied from [place] [id number]. Topic: General - Other >> Jul 14, 2018 12:23 PM Carolyn Stare wrote:  Pt only want t [facility] and ask that he receive a call back on  07/15/2018   Pt refusing to see anyone but [facility]. Needs appt for BLE edema.

## 2018-07-15 NOTE — Telephone Encounter (Signed)
Pt scheduled to see Isaac Phelps on Thursday. He did not want to come out in rain today, his wife has another appt tomorrow so he opted to come in Thursday. Nothing further needed.

## 2018-07-15 NOTE — Telephone Encounter (Signed)
Sheena, please see that this pt gets scheduled in one of [facility]'s openings.

## 2018-07-17 ENCOUNTER — Ambulatory Visit (INDEPENDENT_AMBULATORY_CARE_PROVIDER_SITE_OTHER): Payer: Medicare Other | Admitting: Adult Health

## 2018-07-17 ENCOUNTER — Encounter: Payer: Self-pay | Admitting: Adult Health

## 2018-07-17 VITALS — BP 164/80 | Temp 98.2°F | Wt 192.0 lb

## 2018-07-17 DIAGNOSIS — R600 Localized edema: Secondary | ICD-10-CM | POA: Diagnosis not present

## 2018-07-17 NOTE — Progress Notes (Signed)
Subjective:    Patient ID: Isaac Phelps, male    DOB: 02-Jul-1930, 85 y.o.   MRN: [id number]  HPI 85 year old male who  has a past medical history of Anxiety, BENIGN PROSTATIC HYPERTROPHY (04/01/2008), Coronary artery disease, GERD (gastroesophageal reflux disease), HYPERLIPIDEMIA (05/26/2007), HYPERTENSION (02/07/2007), Syncope and collapse, and TOBACCO ABUSE (04/01/2008).  His son in law is with him at this visit. He is here for follow up regarding long standing lower extremity edema. During his last visit he was referred to vascular but Vascular was unable to get in touch with him so the referral was cancelled.   He has stopped taking torsimide as it was causing him to urinate too much.    He reports no improvement in his swelling   Review of Systems See HPI   Past Medical History:  Diagnosis Date  . Anxiety   . BENIGN PROSTATIC HYPERTROPHY 04/01/2008  . Coronary artery disease   . GERD (gastroesophageal reflux disease)   . HYPERLIPIDEMIA 05/26/2007  . HYPERTENSION 02/07/2007  . Syncope and collapse   . TOBACCO ABUSE 04/01/2008    Social History   Socioeconomic History  . Marital status: Married    Spouse name: Not on file  . Number of children: Not on file  . Years of education: Not on file  . Highest education level: Not on file  Occupational History  . Not on file  Social Needs  . Financial resource strain: Not on file  . Food insecurity:    Worry: Not on file    Inability: Not on file  . Transportation needs:    Medical: Not on file    Non-medical: Not on file  Tobacco Use  . Smoking status: Current Every Day Smoker    Packs/day: 0.30    Types: Cigarettes  . Smokeless tobacco: Never Used  Substance and Sexual Activity  . Alcohol use: No  . Drug use: No  . Sexual activity: Not on file  Lifestyle  . Physical activity:    Days per week: Not on file    Minutes per session: Not on file  . Stress: Not on file  Relationships  . Social connections:    Talks  on phone: Not on file    Gets together: Not on file    Attends religious service: Not on file    Active member of club or organization: Not on file    Attends meetings of clubs or organizations: Not on file    Relationship status: Not on file  . Intimate partner violence:    Fear of current or ex partner: Not on file    Emotionally abused: Not on file    Physically abused: Not on file    Forced sexual activity: Not on file  Other Topics Concern  . Not on file  Social History Narrative  . Not on file    Past Surgical History:  Procedure Laterality Date  . APPENDECTOMY    . TONSILLECTOMY      Family History  Problem Relation Age of Onset  . Arthritis Neg Hx        family  . Hypertension Neg Hx        family  . Stroke Neg Hx        family , 1st degree relative    Allergies  Allergen Reactions  . Sulfa Antibiotics     Current Outpatient Medications on File Prior to Visit  Medication Sig Dispense Refill  . allopurinol (ZYLOPRIM)  100 MG tablet TAKE 2 TABLETS BY MOUTH ONCE DAILY 180 tablet 0  . amLODipine (NORVASC) 10 MG tablet Take 1 tablet (10 mg total) by mouth daily. 90 tablet 3  . clobetasol ointment (TEMOVATE) 8.65 % Apply 1 application topically 2 (two) times daily.   0  . losartan (COZAAR) 100 MG tablet Take 1 tablet (100 mg total) by mouth daily. 90 tablet 3  . potassium chloride (K-DUR) 10 MEQ tablet Take 1 tablet (10 mEq total) by mouth daily for 30 days. 30 tablet 0  . simvastatin (ZOCOR) 40 MG tablet Take 0.5 tablets (20 mg total) by mouth daily. 90 tablet 3  . torsemide (DEMADEX) 20 MG tablet Take 1 tablet (20 mg total) by mouth daily for 30 days. 30 tablet 0   No current facility-administered medications on file prior to visit.     BP (!) 164/80   Temp 98.2 F (36.8 C)   Wt 192 lb (87.1 kg)   BMI 27.55 kg/m       Objective:   Physical Exam Vitals signs and nursing note reviewed.  Constitutional:      Appearance: Normal appearance.    Cardiovascular:     Rate and Rhythm: Normal rate and regular rhythm.     Pulses: Normal pulses.     Heart sounds: Normal heart sounds.  Pulmonary:     Effort: Pulmonary effort is normal.     Breath sounds: Normal breath sounds.  Musculoskeletal: Normal range of motion.     Right lower leg: Edema present.     Left lower leg: Edema present.  Skin:    General: Skin is warm and dry.     Capillary Refill: Capillary refill takes less than 2 seconds.  Neurological:     General: No focal deficit present.     Mental Status: He is alert and oriented to person, place, and time.       Assessment & Plan:  1. Lower extremity edema - Edema actually looks slightly improved today.  - I encouraged again to use compression socks and elevate legs at rest - neither of which he was doing.  - Ambulatory referral to Vascular Surgery  Dorothyann Peng, NP

## 2018-07-31 ENCOUNTER — Ambulatory Visit (INDEPENDENT_AMBULATORY_CARE_PROVIDER_SITE_OTHER): Payer: Medicare Other | Admitting: Adult Health

## 2018-07-31 ENCOUNTER — Encounter: Payer: Self-pay | Admitting: Adult Health

## 2018-07-31 VITALS — BP 146/70 | Temp 98.5°F | Wt 190.0 lb

## 2018-07-31 DIAGNOSIS — L700 Acne vulgaris: Secondary | ICD-10-CM

## 2018-07-31 DIAGNOSIS — R600 Localized edema: Secondary | ICD-10-CM

## 2018-07-31 MED ORDER — CLINDAMYCIN PHOSPHATE 1 % EX GEL: Freq: Two times a day (BID) | CUTANEOUS | 0 refills | Status: DC

## 2018-07-31 NOTE — Progress Notes (Signed)
Subjective:    Patient ID: Isaac Phelps, male    DOB: April 13, 1931, 85 y.o.   MRN: [id number]  HPI 85 year old male who  has a past medical history of Anxiety, BENIGN PROSTATIC HYPERTROPHY (04/01/2008), Coronary artery disease, GERD (gastroesophageal reflux disease), HYPERLIPIDEMIA (05/26/2007), HYPERTENSION (02/07/2007), Syncope and collapse, and TOBACCO ABUSE (04/01/2008).  He presents to the office today for follow up regarding long standing issue of lower extremity edema. He has been on lasix for multiple years and then switched to Torsemide without resolution.   He has been referred to [facility] but has not heard anything yet   He also continues to complain of blackheads on his chest. He was previously prescribed doxycycline and has since noticed some improvement in his symptoms.   Review of Systems See HPI   Past Medical History:  Diagnosis Date  . Anxiety   . BENIGN PROSTATIC HYPERTROPHY 04/01/2008  . Coronary artery disease   . GERD (gastroesophageal reflux disease)   . HYPERLIPIDEMIA 05/26/2007  . HYPERTENSION 02/07/2007  . Syncope and collapse   . TOBACCO ABUSE 04/01/2008    Social History   Socioeconomic History  . Marital status: Married    Spouse name: Not on file  . Number of children: Not on file  . Years of education: Not on file  . Highest education level: Not on file  Occupational History  . Not on file  Social Needs  . Financial resource strain: Not on file  . Food insecurity:    Worry: Not on file    Inability: Not on file  . Transportation needs:    Medical: Not on file    Non-medical: Not on file  Tobacco Use  . Smoking status: Current Every Day Smoker    Packs/day: 0.30    Types: Cigarettes  . Smokeless tobacco: Never Used  Substance and Sexual Activity  . Alcohol use: No  . Drug use: No  . Sexual activity: Not on file  Lifestyle  . Physical activity:    Days per week: Not on file    Minutes per session: Not on file  . Stress: Not  on file  Relationships  . Social connections:    Talks on phone: Not on file    Gets together: Not on file    Attends religious service: Not on file    Active member of club or organization: Not on file    Attends meetings of clubs or organizations: Not on file    Relationship status: Not on file  . Intimate partner violence:    Fear of current or ex partner: Not on file    Emotionally abused: Not on file    Physically abused: Not on file    Forced sexual activity: Not on file  Other Topics Concern  . Not on file  Social History Narrative  . Not on file    Past Surgical History:  Procedure Laterality Date  . APPENDECTOMY    . TONSILLECTOMY      Family History  Problem Relation Age of Onset  . Arthritis Neg Hx        family  . Hypertension Neg Hx        family  . Stroke Neg Hx        family , 1st degree relative    Allergies  Allergen Reactions  . Sulfa Antibiotics     Current Outpatient Medications on File Prior to Visit  Medication Sig Dispense Refill  .  allopurinol (ZYLOPRIM) 100 MG tablet TAKE 2 TABLETS BY MOUTH ONCE DAILY 180 tablet 0  . amLODipine (NORVASC) 10 MG tablet Take 1 tablet (10 mg total) by mouth daily. 90 tablet 3  . clobetasol ointment (TEMOVATE) 2.02 % Apply 1 application topically 2 (two) times daily.   0  . losartan (COZAAR) 100 MG tablet Take 1 tablet (100 mg total) by mouth daily. 90 tablet 3  . simvastatin (ZOCOR) 40 MG tablet Take 0.5 tablets (20 mg total) by mouth daily. 90 tablet 3  . potassium chloride (K-DUR) 10 MEQ tablet Take 1 tablet (10 mEq total) by mouth daily for 30 days. 30 tablet 0  . torsemide (DEMADEX) 20 MG tablet Take 1 tablet (20 mg total) by mouth daily for 30 days. 30 tablet 0   No current facility-administered medications on file prior to visit.     BP (!) 146/70   Temp 98.5 F (36.9 C)   Wt 190 lb (86.2 kg)   BMI 27.26 kg/m       Objective:   Physical Exam Vitals signs and nursing note reviewed.    Constitutional:      Appearance: Normal appearance.  Cardiovascular:     Rate and Rhythm: Normal rate and regular rhythm.     Pulses: Normal pulses.     Heart sounds: Normal heart sounds.  Pulmonary:     Effort: Pulmonary effort is normal.     Breath sounds: Normal breath sounds.  Musculoskeletal: Normal range of motion.        General: No tenderness.     Right lower leg: Edema present.     Left lower leg: Edema present.     Comments: Non pitting edema in bilateral lower extremities R>L.   Skin:    Capillary Refill: Capillary refill takes less than 2 seconds.     Comments: Blackheads throughout torso   Neurological:     General: No focal deficit present.     Mental Status: He is alert and oriented to person, place, and time. Mental status is at baseline.        Assessment & Plan:  1. Bilateral lower extremity edema - Continue to use compression socks and elevated legs - Ve[facility] and V[facility] has not called yet. Advised to notify me if he or his DPR has not heard anything in the next week   2. Blackhead - Can also use Apricot scrub in the shower  - clindamycin (CLINDAGEL) 1 % gel; Apply topically 2 (two) times daily.  Dispense: 30 g; Refill: 0  Dorothyann Peng, NP

## 2018-07-31 NOTE — Patient Instructions (Addendum)
[facility] in [place], [place] Address: [address], [place], [place] [address] Phone: [phone]  Pick up some Apricot scrub at the pharmacy for the blackheads on your chest.   I have also sent in a prescription for clindamycin gel that you can apply to the blackheads

## 2018-09-04 ENCOUNTER — Other Ambulatory Visit: Payer: Self-pay | Admitting: *Deleted

## 2018-09-04 DIAGNOSIS — R609 Edema, unspecified: Secondary | ICD-10-CM

## 2018-09-05 ENCOUNTER — Telehealth (HOSPITAL_COMMUNITY): Payer: Self-pay | Admitting: Rehabilitation

## 2018-09-05 NOTE — Telephone Encounter (Signed)
The above patient or their representative was contacted and gave the following answers to these questions:         Do you have any of the following symptoms? No  Fever                    Cough                   Shortness of breath  Do  you have any of the following other symptoms? No   muscle pain         vomiting,        diarrhea        rash         weakness        red eye        abdominal pain         bruising          bruising or bleeding              joint pain           severe headache    Have you been in contact with someone who was or has been sick in the past 2 weeks? No  Yes                 Unsure                         Unable to assess   Does the person that you were in contact with have any of the following symptoms? No  Cough         shortness of breath           muscle pain         vomiting,            diarrhea            rash            weakness           fever            red eye           abdominal pain           bruising  or  bleeding                joint pain                severe headache               Have you  or someone you have been in contact with traveled internationally in th last month?  No       If yes, which countries?   Have you  or someone you have been in contact with traveled outside [place] in th last month?  No       If yes, which state and city?   COMMENTS OR ACTION PLAN FOR THIS PATIENT:          

## 2018-09-08 ENCOUNTER — Other Ambulatory Visit: Payer: Self-pay

## 2018-09-08 ENCOUNTER — Encounter: Payer: Self-pay | Admitting: Family

## 2018-09-08 ENCOUNTER — Ambulatory Visit (HOSPITAL_COMMUNITY)
Admission: RE | Admit: 2018-09-08 | Discharge: 2018-09-08 | Disposition: A | Discharge status: Home / Self Care | Payer: Medicare Other | Source: Ambulatory Visit | Attending: Family | Admitting: Family

## 2018-09-08 ENCOUNTER — Ambulatory Visit (INDEPENDENT_AMBULATORY_CARE_PROVIDER_SITE_OTHER): Payer: Medicare Other | Admitting: Family

## 2018-09-08 VITALS — BP 230/98 | HR 62 | Temp 97.5°F | Resp 20 | Ht 72.0 in | Wt 183.4 lb

## 2018-09-08 DIAGNOSIS — R609 Edema, unspecified: Secondary | ICD-10-CM | POA: Diagnosis present

## 2018-09-08 DIAGNOSIS — I10 Essential (primary) hypertension: Secondary | ICD-10-CM

## 2018-09-08 DIAGNOSIS — F17200 Nicotine dependence, unspecified, uncomplicated: Secondary | ICD-10-CM

## 2018-09-08 DIAGNOSIS — I872 Venous insufficiency (chronic) (peripheral): Secondary | ICD-10-CM | POA: Diagnosis not present

## 2018-09-08 DIAGNOSIS — Z9114 Patient's other noncompliance with medication regimen: Secondary | ICD-10-CM | POA: Diagnosis not present

## 2018-09-08 NOTE — Progress Notes (Signed)
Referred by:  Dorothyann Peng, NP [address] [place], [place] [address]  Reason for referral: Edema in both legs, Left >Right  History of Present Illness  Isaac Phelps is a 85 y.o. (02-Sep-1930) male who presents with chief complaint: swollen leg, left lower, ankle, and foot.  Patient notes, onset of swelling over 12 months ago, associated with nothing, does not seem to subside by morning with elevation overnight.  He denies any pruritus, denies pain.  The patient has had no history of DVT, denies any known history of varicose veins, denies history of venous stasis ulcers, denies history of skin changes in lower legs. He reports a hx of blisters  Occurring at his right ankle a few years ago after an insect bite.  The patient has used compression stockings in the past. He has trouble putting on his compression hose, and does not use unless his family helps him put on, states when on the the swelling decreases, but feels too tight.  He has not been taking his prescribed diuretic, states the diuretic did not decrease the swelling in his legs. He has CAD but does not seem to have CHF.   He denies headache, denies chest pain or dyspnea; his blood pressure is elevated now, he did not take his anithyptertensive until after we checked his blood pressure. Pt states he has been forgetting to take some doses.   He stays physically active.   He does not have DM. He is an active smoker, 2/3 ppd, started in his 81's.   Past Medical History:  Diagnosis Date  . Anxiety   . BENIGN PROSTATIC HYPERTROPHY 04/01/2008  . Coronary artery disease   . GERD (gastroesophageal reflux disease)   . HYPERLIPIDEMIA 05/26/2007  . HYPERTENSION 02/07/2007  . Syncope and collapse   . TOBACCO ABUSE 04/01/2008    Past Surgical History:  Procedure Laterality Date  . APPENDECTOMY    . TONSILLECTOMY      Social History   Socioeconomic History  . Marital status: Married    Spouse name: Not on file  .  Number of children: Not on file  . Years of education: Not on file  . Highest education level: Not on file  Occupational History  . Not on file  Social Needs  . Financial resource strain: Not on file  . Food insecurity:    Worry: Not on file    Inability: Not on file  . Transportation needs:    Medical: Not on file    Non-medical: Not on file  Tobacco Use  . Smoking status: Current Every Day Smoker    Packs/day: 0.30    Types: Cigarettes  . Smokeless tobacco: Never Used  Substance and Sexual Activity  . Alcohol use: No  . Drug use: No  . Sexual activity: Not on file  Lifestyle  . Physical activity:    Days per week: Not on file    Minutes per session: Not on file  . Stress: Not on file  Relationships  . Social connections:    Talks on phone: Not on file    Gets together: Not on file    Attends religious service: Not on file    Active member of club or organization: Not on file    Attends meetings of clubs or organizations: Not on file    Relationship status: Not on file  . Intimate partner violence:    Fear of current or ex partner: Not on file  Emotionally abused: Not on file    Physically abused: Not on file    Forced sexual activity: Not on file  Other Topics Concern  . Not on file  Social History Narrative  . Not on file    Family History  Problem Relation Age of Onset  . Arthritis Neg Hx        family  . Hypertension Neg Hx        family  . Stroke Neg Hx        family , 1st degree relative    Current Outpatient Medications on File Prior to Visit  Medication Sig Dispense Refill  . allopurinol (ZYLOPRIM) 100 MG tablet TAKE 2 TABLETS BY MOUTH ONCE DAILY 180 tablet 0  . amLODipine (NORVASC) 10 MG tablet Take 1 tablet (10 mg total) by mouth daily. 90 tablet 3  . losartan (COZAAR) 100 MG tablet Take 1 tablet (100 mg total) by mouth daily. 90 tablet 3  . simvastatin (ZOCOR) 40 MG tablet Take 0.5 tablets (20 mg total) by mouth daily. 90 tablet 3   No  current facility-administered medications on file prior to visit.     Allergies  Allergen Reactions  . Sulfa Antibiotics     REVIEW OF SYSTEMS: Cardiovascular: No chest pain, chest pressure, palpitations, orthopnea, or dyspnea on exertion. No claudication or rest pain,  No history of DVT or phlebitis. Pulmonary: No productive cough, asthma or wheezing. Neurologic: No weakness, paresthesias, aphasia, or amaurosis. No dizziness. Hematologic: No bleeding problems or clotting disorders. Musculoskeletal: No joint pain or joint swelling. Gastrointestinal: No blood in stool or hematemesis Genitourinary: No dysuria or hematuria. Psychiatric:: No history of major depression. Integumentary: No rashes or ulcers. Constitutional: No fever or chills.  Physical Examination Vitals:   09/08/18 1110 09/08/18 1210  BP: (!) 229/101 (!) 230/98  Pulse: 62   Resp: 20   Temp: (!) 97.5 F (36.4 C)   TempSrc: Oral   SpO2: 100%   Weight: 183 lb 6.4 oz (83.2 kg)   Height: 6' (1.829 m)    Body mass index is 24.87 kg/m.  PHYSICAL EXAMINATION: General: The patient appears younger than his stated age.  Accompanied by his son in law.  HEENT:  No gross abnormalities Pulmonary: Respirations are non-labored, CTAB with adequate air movement in all fields.  Abdomen: Soft and non-tender with normal bowel sounds Musculoskeletal: There are no major deformities.   Neurologic: No focal weakness or paresthesias are detected Skin: There are no ulcer or rashes noted. Psychiatric: The patient has normal affect. Cardiovascular: There is a regular rate and rhythm without significant murmur appreciated.  Right ankle with 2-3+ pitting edema, left lower leg with 1-2+ pitting and non pitting edema, dorsum left foot and left ankle with 3+ pitting edema.  Varicosities noted in left lower leg.  Several thick toenails.  See photo below.      Vascular: Vessel Right Left  Carotid  without bruit  without bruit  Aorta  Not palpable N/A  Femoral 2+Palpable 2+Palpable  Popliteal 2+Not palpable Not Not palpable  PT Not Palpable Not Palpable  DP 1+Palpable Not Palpable (edematous)    Non-Invasive Vascular Imaging  BLE Venous Duplex (Date: 09/08/2018):   Summary: Right: No reflux was noted in the common femoral vein, femoral vein in the thigh, great saphenous vein at the saphenofemoral junction, great saphenous vein at the proximal thigh, great saphenous vein at the mid thigh, great saphenous vein at the distal  thigh, great saphenous vein at the  knee, origin of the small saphenous vein, and proximal small saphenous vein. Abnormal reflux times were noted in the popliteal vein, and mid small saphenous vein. Evidence of chronic venous insufficiency is detected in  the small saphenous vein, and deep venous system. There is no evidence of deep vein thrombosis in the lower extremity. There is no evidence of superficial venous thrombosis. No cystic structure found in the popliteal fossa. Left: No reflux was noted in the popliteal vein, origin of the small saphenous vein, and proximal small saphenous vein. Abnormal reflux times were noted in the common femoral vein, femoral vein in the thigh, great saphenous vein at the saphenofemoral  junction, great saphenous vein at the proximal thigh, great saphenous vein at the mid thigh, great saphenous vein at the distal thigh, and great saphenous vein at the knee. Evidence of chronic venous insufficiency is detected in the great saphenous vein,  and deep venous system. There is no evidence of deep vein thrombosis in the lower extremity. There is no evidence of superficial venous thrombosis. No cystic structure found in the popliteal fossa.   Medical Decision Making  SHAHRAM ALEXOPOULOS is a 85 y.o. male who presents with bilateral LE chronic venous insufficiency and edema in his feet and lower legs that is likely dependent, Left >right.   Bilateral venous duplex today shows chronic  venous insufficiency, left moreso than right, left edema is worse than right. No DVT, no Baker's Cyst.   Left greater saphenous vein is amenable to GSV ablation to help decrease the edema, but pt chose at age 61 to forego this and use conservative measures to address bilateral LE dependent edema secondary to chronic venous insufficiency: 1) ace wraps to bilateral legs if he is unable to donn compression hose. Ace wraps were applied to pt legs in our office.  2) Elevate legs when not active: feet above slightly flexed knees, feet above heart overnight and 3-4x/day for 20 minutes. 3) Tobacco cessation: Over 3 minutes was spent counseling patient re smoking cessation, and patient was given several free resources re smoking cessation. 4) Remain physically active   I offered to refer pt to a podiatrist to trim his thick toenails, he declined at this time, I advised him to call if he would like a referral, would then refer to [facility].   Asymptomatic uncontrolled hypertension: pt admits to not taking his antihypertension medication very often. I advised him to take his medication as prescribed, and discuss his hypertension management with his PCP. I also discussed with him the complications of uncontrolled hypertension, including but not limited to stroke, MI, and kidney failure.  Son in law with pt stated he will assist pt to take his medication as prescribed, encourage tobacco cessation, encourage elevation of his legs when pt is not active, and assist pt with ace wrap compression to his lower legs.     Follow up with [place] as needed.   Thank your for allowing [place] to participate in this patient's care.    Clemon Chambers, RN, MSN, FNP-C [facility] Office: [phone]  09/08/2018, 5:15 PM  Clinic MD: Oneida Alar on call

## 2018-09-08 NOTE — Patient Instructions (Signed)
Steps to Quit Smoking  Smoking tobacco can be bad for your health. It can also affect almost every organ in your body. Smoking puts you and people around you at risk for many serious long-lasting (chronic) diseases. Quitting smoking is hard, but it is one of the best things that you can do for your health. It is never too late to quit. What are the benefits of quitting smoking? When you quit smoking, you lower your risk for getting serious diseases and conditions. They can include:  Lung cancer or lung disease.  Heart disease.  Stroke.  Heart attack.  Not being able to have children (infertility).  Weak bones (osteoporosis) and broken bones (fractures). If you have coughing, wheezing, and shortness of breath, those symptoms may get better when you quit. You may also get sick less often. If you are pregnant, quitting smoking can help to lower your chances of having a baby of low birth weight. What can I do to help me quit smoking? Talk with your doctor about what can help you quit smoking. Some things you can do (strategies) include:  Quitting smoking totally, instead of slowly cutting back how much you smoke over a period of time.  Going to in-person counseling. You are more likely to quit if you go to many counseling sessions.  Using resources and support systems, such as: ? Online chats with a counselor. ? Phone quitlines. ? Printed self-help materials. ? Support groups or group counseling. ? Text messaging programs. ? Mobile phone apps or applications.  Taking medicines. Some of these medicines may have nicotine in them. If you are pregnant or breastfeeding, do not take any medicines to quit smoking unless your doctor says it is okay. Talk with your doctor about counseling or other things that can help you. Talk with your doctor about using more than one strategy at the same time, such as taking medicines while you are also going to in-person counseling. This can help make  quitting easier. What things can I do to make it easier to quit? Quitting smoking might feel very hard at first, but there is a lot that you can do to make it easier. Take these steps:  Talk to your family and friends. Ask them to support and encourage you.  Call phone quitlines, reach out to support groups, or work with a counselor.  Ask people who smoke to not smoke around you.  Avoid places that make you want (trigger) to smoke, such as: ? Bars. ? Parties. ? Smoke-break areas at work.  Spend time with people who do not smoke.  Lower the stress in your life. Stress can make you want to smoke. Try these things to help your stress: ? Getting regular exercise. ? Deep-breathing exercises. ? Yoga. ? Meditating. ? Doing a body scan. To do this, close your eyes, focus on one area of your body at a time from head to toe, and notice which parts of your body are tense. Try to relax the muscles in those areas.  Download or buy apps on your mobile phone or tablet that can help you stick to your quit plan. There are many free apps, such as QuitGuide from the CDC (Centers for Disease Control and Prevention). You can find more support from smokefree.gov and other websites. This information is not intended to replace advice given to you by your health care provider. Make sure you discuss any questions you have with your health care provider. Document Released: 03/17/2009 Document Revised: 01/17/2016   Document Reviewed: 10/05/2014 Elsevier Interactive Patient Education  2019 Elsevier Inc.    Edema  Edema is when you have too much fluid in your body or under your skin. Edema may make your legs, feet, and ankles swell up. Swelling is also common in looser tissues, like around your eyes. This is a common condition. It gets more common as you get older. There are many possible causes of edema. Eating too much salt (sodium) and being on your feet or sitting for a long time can cause edema in your legs,  feet, and ankles. Hot weather may make edema worse. Edema is usually painless. Your skin may look swollen or shiny. Follow these instructions at home:  Keep the swollen body part raised (elevated) above the level of your heart when you are sitting or lying down.  Do not sit still or stand for a long time.  Do not wear tight clothes. Do not wear garters on your upper legs.  Exercise your legs. This can help the swelling go down.  Wear elastic bandages or support stockings as told by your doctor.  Eat a low-salt (low-sodium) diet to reduce fluid as told by your doctor.  Depending on the cause of your swelling, you may need to limit how much fluid you drink (fluid restriction).  Take over-the-counter and prescription medicines only as told by your doctor. Contact a doctor if:  Treatment is not working.  You have heart, liver, or kidney disease and have symptoms of edema.  You have sudden and unexplained weight gain. Get help right away if:  You have shortness of breath or chest pain.  You cannot breathe when you lie down.  You have pain, redness, or warmth in the swollen areas.  You have heart, liver, or kidney disease and get edema all of a sudden.  You have a fever and your symptoms get worse all of a sudden. Summary  Edema is when you have too much fluid in your body or under your skin.  Edema may make your legs, feet, and ankles swell up. Swelling is also common in looser tissues, like around your eyes.  Raise (elevate) the swollen body part above the level of your heart when you are sitting or lying down.  Follow your doctor's instructions about diet and how much fluid you can drink (fluid restriction). This information is not intended to replace advice given to you by your health care provider. Make sure you discuss any questions you have with your health care provider. Document Released: 11/07/2007 Document Revised: 06/08/2016 Document Reviewed: 06/08/2016 Elsevier  Interactive Patient Education  2019 Elsevier Inc.      To decrease swelling in your feet and legs: Elevate feet above slightly bent knees, feet above heart, overnight and 3-4 times per day for 20 minutes.

## 2018-09-13 ENCOUNTER — Other Ambulatory Visit: Payer: Self-pay | Admitting: Adult Health

## 2018-09-15 NOTE — Telephone Encounter (Signed)
SENT TO THE PHARMACY BY E-SCRIBE. 

## 2020-05-03 ENCOUNTER — Telehealth: Payer: Self-pay | Admitting: Adult Health

## 2020-05-03 NOTE — Telephone Encounter (Signed)
Tried calling pt to schedule Medicare Annual Wellness Visit (AWV) either virtually or in office. No voicemail  Last AWV 02/18/2017; please schedule at anytime with [facility] Nurse Health Advisor 1 or 2   This should be a 45 minute visit.

## 2020-07-06 ENCOUNTER — Telehealth: Payer: Self-pay | Admitting: Adult Health

## 2020-07-06 NOTE — Telephone Encounter (Signed)
Left message for patient to call back and schedule Medicare Annual Wellness Visit (AWV) either virtually or in office.   Last AWV 02/18/17  please schedule at anytime with [facility] Nurse Health Advisor 1 or 2   This should be a 45 minute visit. Patient also needs appointment with pcp last appointment 07/31/2018

## 2020-07-29 ENCOUNTER — Emergency Department (HOSPITAL_COMMUNITY): Payer: Medicare Other

## 2020-07-29 ENCOUNTER — Other Ambulatory Visit: Payer: Self-pay

## 2020-07-29 ENCOUNTER — Inpatient Hospital Stay (HOSPITAL_COMMUNITY)
Admission: EM | Admit: 2020-07-29 | Discharge: 2020-08-04 | DRG: 374 | Disposition: A | Discharge status: Home / Self Care | Payer: Medicare Other | Attending: Internal Medicine | Admitting: Internal Medicine

## 2020-07-29 ENCOUNTER — Ambulatory Visit (HOSPITAL_COMMUNITY)
Admission: EM | Admit: 2020-07-29 | Discharge: 2020-07-29 | Disposition: A | Discharge status: Home / Self Care | Payer: Medicare Other

## 2020-07-29 DIAGNOSIS — Z9119 Patient's noncompliance with other medical treatment and regimen: Secondary | ICD-10-CM

## 2020-07-29 DIAGNOSIS — D631 Anemia in chronic kidney disease: Secondary | ICD-10-CM | POA: Diagnosis present

## 2020-07-29 DIAGNOSIS — R0789 Other chest pain: Secondary | ICD-10-CM | POA: Diagnosis present

## 2020-07-29 DIAGNOSIS — K6289 Other specified diseases of anus and rectum: Secondary | ICD-10-CM

## 2020-07-29 DIAGNOSIS — M109 Gout, unspecified: Secondary | ICD-10-CM | POA: Diagnosis present

## 2020-07-29 DIAGNOSIS — I6381 Other cerebral infarction due to occlusion or stenosis of small artery: Secondary | ICD-10-CM | POA: Diagnosis present

## 2020-07-29 DIAGNOSIS — E785 Hyperlipidemia, unspecified: Secondary | ICD-10-CM | POA: Diagnosis present

## 2020-07-29 DIAGNOSIS — C20 Malignant neoplasm of rectum: Secondary | ICD-10-CM | POA: Diagnosis not present

## 2020-07-29 DIAGNOSIS — I160 Hypertensive urgency: Secondary | ICD-10-CM | POA: Diagnosis not present

## 2020-07-29 DIAGNOSIS — I161 Hypertensive emergency: Secondary | ICD-10-CM | POA: Diagnosis present

## 2020-07-29 DIAGNOSIS — K8020 Calculus of gallbladder without cholecystitis without obstruction: Secondary | ICD-10-CM | POA: Diagnosis present

## 2020-07-29 DIAGNOSIS — F419 Anxiety disorder, unspecified: Secondary | ICD-10-CM | POA: Diagnosis present

## 2020-07-29 DIAGNOSIS — I129 Hypertensive chronic kidney disease with stage 1 through stage 4 chronic kidney disease, or unspecified chronic kidney disease: Secondary | ICD-10-CM | POA: Diagnosis present

## 2020-07-29 DIAGNOSIS — D509 Iron deficiency anemia, unspecified: Secondary | ICD-10-CM | POA: Diagnosis present

## 2020-07-29 DIAGNOSIS — Z882 Allergy status to sulfonamides status: Secondary | ICD-10-CM

## 2020-07-29 DIAGNOSIS — I739 Peripheral vascular disease, unspecified: Secondary | ICD-10-CM | POA: Diagnosis present

## 2020-07-29 DIAGNOSIS — Z791 Long term (current) use of non-steroidal anti-inflammatories (NSAID): Secondary | ICD-10-CM

## 2020-07-29 DIAGNOSIS — Z20822 Contact with and (suspected) exposure to covid-19: Secondary | ICD-10-CM | POA: Diagnosis present

## 2020-07-29 DIAGNOSIS — Z8673 Personal history of transient ischemic attack (TIA), and cerebral infarction without residual deficits: Secondary | ICD-10-CM

## 2020-07-29 DIAGNOSIS — K921 Melena: Secondary | ICD-10-CM | POA: Diagnosis present

## 2020-07-29 DIAGNOSIS — N179 Acute kidney failure, unspecified: Secondary | ICD-10-CM

## 2020-07-29 DIAGNOSIS — K7689 Other specified diseases of liver: Secondary | ICD-10-CM | POA: Diagnosis present

## 2020-07-29 DIAGNOSIS — R0781 Pleurodynia: Secondary | ICD-10-CM | POA: Diagnosis present

## 2020-07-29 DIAGNOSIS — K219 Gastro-esophageal reflux disease without esophagitis: Secondary | ICD-10-CM | POA: Diagnosis present

## 2020-07-29 DIAGNOSIS — N400 Enlarged prostate without lower urinary tract symptoms: Secondary | ICD-10-CM | POA: Diagnosis present

## 2020-07-29 DIAGNOSIS — I6340 Cerebral infarction due to embolism of unspecified cerebral artery: Secondary | ICD-10-CM | POA: Insufficient documentation

## 2020-07-29 DIAGNOSIS — K4090 Unilateral inguinal hernia, without obstruction or gangrene, not specified as recurrent: Secondary | ICD-10-CM | POA: Diagnosis present

## 2020-07-29 DIAGNOSIS — M1991 Primary osteoarthritis, unspecified site: Secondary | ICD-10-CM | POA: Diagnosis present

## 2020-07-29 DIAGNOSIS — Z79899 Other long term (current) drug therapy: Secondary | ICD-10-CM

## 2020-07-29 DIAGNOSIS — Z6823 Body mass index (BMI) 23.0-23.9, adult: Secondary | ICD-10-CM

## 2020-07-29 DIAGNOSIS — I10 Essential (primary) hypertension: Secondary | ICD-10-CM | POA: Diagnosis present

## 2020-07-29 DIAGNOSIS — R52 Pain, unspecified: Secondary | ICD-10-CM

## 2020-07-29 DIAGNOSIS — Z9114 Patient's other noncompliance with medication regimen: Secondary | ICD-10-CM

## 2020-07-29 DIAGNOSIS — R932 Abnormal findings on diagnostic imaging of liver and biliary tract: Secondary | ICD-10-CM | POA: Diagnosis present

## 2020-07-29 DIAGNOSIS — N1832 Chronic kidney disease, stage 3b: Secondary | ICD-10-CM | POA: Diagnosis present

## 2020-07-29 DIAGNOSIS — R778 Other specified abnormalities of plasma proteins: Secondary | ICD-10-CM | POA: Diagnosis present

## 2020-07-29 DIAGNOSIS — R911 Solitary pulmonary nodule: Secondary | ICD-10-CM | POA: Diagnosis present

## 2020-07-29 DIAGNOSIS — I2510 Atherosclerotic heart disease of native coronary artery without angina pectoris: Secondary | ICD-10-CM | POA: Diagnosis present

## 2020-07-29 DIAGNOSIS — R200 Anesthesia of skin: Secondary | ICD-10-CM | POA: Diagnosis present

## 2020-07-29 DIAGNOSIS — R54 Age-related physical debility: Secondary | ICD-10-CM | POA: Diagnosis present

## 2020-07-29 DIAGNOSIS — F17210 Nicotine dependence, cigarettes, uncomplicated: Secondary | ICD-10-CM | POA: Diagnosis present

## 2020-07-29 HISTORY — DX: Malignant neoplasm of rectum: C20

## 2020-07-29 LAB — COMPREHENSIVE METABOLIC PANEL
ALT: 8 U/L (ref 0–44)
AST: 34 U/L (ref 15–41)
Albumin: 3.5 g/dL (ref 3.5–5.0)
Alkaline Phosphatase: 77 U/L (ref 38–126)
Anion gap: 11 (ref 5–15)
BUN: 20 mg/dL (ref 8–23)
CO2: 21 mmol/L — ABNORMAL LOW (ref 22–32)
Calcium: 8.9 mg/dL (ref 8.9–10.3)
Chloride: 107 mmol/L (ref 98–111)
Creatinine, Ser: 1.77 mg/dL — ABNORMAL HIGH (ref 0.61–1.24)
GFR, Estimated: 37 mL/min — ABNORMAL LOW (ref >60)
Glucose, Bld: 97 mg/dL (ref 70–99)
Potassium: 4.5 mmol/L (ref 3.5–5.1)
Sodium: 139 mmol/L (ref 135–145)
Total Bilirubin: 0.7 mg/dL (ref 0.3–1.2)
Total Protein: 7.6 g/dL (ref 6.5–8.1)

## 2020-07-29 LAB — URINALYSIS, ROUTINE W REFLEX MICROSCOPIC
Bacteria, UA: NONE SEEN
Bilirubin Urine: NEGATIVE
Glucose, UA: NEGATIVE mg/dL
Hgb urine dipstick: NEGATIVE
Ketones, ur: NEGATIVE mg/dL
Leukocytes,Ua: NEGATIVE
Nitrite: NEGATIVE
Protein, ur: 100 mg/dL — AB
Specific Gravity, Urine: 1.016 (ref 1.005–1.030)
pH: 7 (ref 5.0–8.0)

## 2020-07-29 LAB — CBC WITH DIFFERENTIAL/PLATELET
Abs Immature Granulocytes: 0.03 10*3/uL (ref 0.00–0.07)
Basophils Absolute: 0 10*3/uL (ref 0.0–0.1)
Basophils Relative: 1 %
Eosinophils Absolute: 0.1 10*3/uL (ref 0.0–0.5)
Eosinophils Relative: 2 %
HCT: 27.8 % — ABNORMAL LOW (ref 39.0–52.0)
Hemoglobin: 8.3 g/dL — ABNORMAL LOW (ref 13.0–17.0)
Immature Granulocytes: 1 %
Lymphocytes Relative: 14 %
Lymphs Abs: 0.8 10*3/uL (ref 0.7–4.0)
MCH: 22.3 pg — ABNORMAL LOW (ref 26.0–34.0)
MCHC: 29.9 g/dL — ABNORMAL LOW (ref 30.0–36.0)
MCV: 74.5 fL — ABNORMAL LOW (ref 80.0–100.0)
Monocytes Absolute: 0.5 10*3/uL (ref 0.1–1.0)
Monocytes Relative: 8 %
Neutro Abs: 4.6 10*3/uL (ref 1.7–7.7)
Neutrophils Relative %: 74 %
Platelets: 235 10*3/uL (ref 150–400)
RBC: 3.73 MIL/uL — ABNORMAL LOW (ref 4.22–5.81)
RDW: 17.2 % — ABNORMAL HIGH (ref 11.5–15.5)
WBC: 6.1 10*3/uL (ref 4.0–10.5)
nRBC: 0 % (ref 0.0–0.2)

## 2020-07-29 LAB — I-STAT CHEM 8, ED
BUN: 29 mg/dL — ABNORMAL HIGH (ref 8–23)
Calcium, Ion: 1.17 mmol/L (ref 1.15–1.40)
Chloride: 108 mmol/L (ref 98–111)
Creatinine, Ser: 1.8 mg/dL — ABNORMAL HIGH (ref 0.61–1.24)
Glucose, Bld: 93 mg/dL (ref 70–99)
HCT: 27 % — ABNORMAL LOW (ref 39.0–52.0)
Hemoglobin: 9.2 g/dL — ABNORMAL LOW (ref 13.0–17.0)
Potassium: 4.5 mmol/L (ref 3.5–5.1)
Sodium: 141 mmol/L (ref 135–145)
TCO2: 27 mmol/L (ref 22–32)

## 2020-07-29 LAB — LIPASE, BLOOD: Lipase: 36 U/L (ref 11–51)

## 2020-07-29 LAB — SARS CORONAVIRUS 2 (TAT 6-24 HRS): SARS Coronavirus 2: NEGATIVE

## 2020-07-29 LAB — TROPONIN I (HIGH SENSITIVITY)
Troponin I (High Sensitivity): 27 ng/L — ABNORMAL HIGH (ref <18)
Troponin I (High Sensitivity): 36 ng/L — ABNORMAL HIGH (ref <18)

## 2020-07-29 LAB — POC OCCULT BLOOD, ED: Fecal Occult Bld: POSITIVE — AB

## 2020-07-29 LAB — CBG MONITORING, ED: Glucose-Capillary: 82 mg/dL (ref 70–99)

## 2020-07-29 IMAGING — CT CT ANGIO CHEST-ABD-PELV FOR DISSECTION W/ AND WO/W CM
2 of 7 series · 11 of 46 positions shown, 12 images · non-contrast
Comparison: CT chest [DATE]

CLINICAL DATA: Abdominal pain. Evaluate for abdominal aortic
dissection.

EXAM:
CT ANGIOGRAPHY CHEST, ABDOMEN AND PELVIS
TECHNIQUE: Non-contrast CT of the chest was initially obtained.

[Series 7: dissection 3.0 i30f 3 · axial · 0.69mm/px · z∈[-540,-15]mm · 8 of 214 slices shown, 9 images]
[im 26/214  soft-tissue]
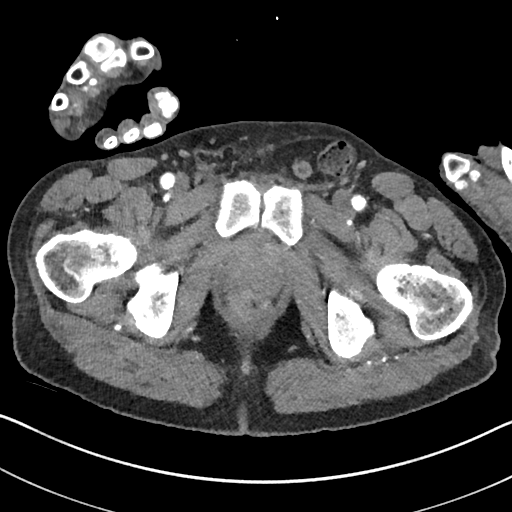
[im 26/214  bone]
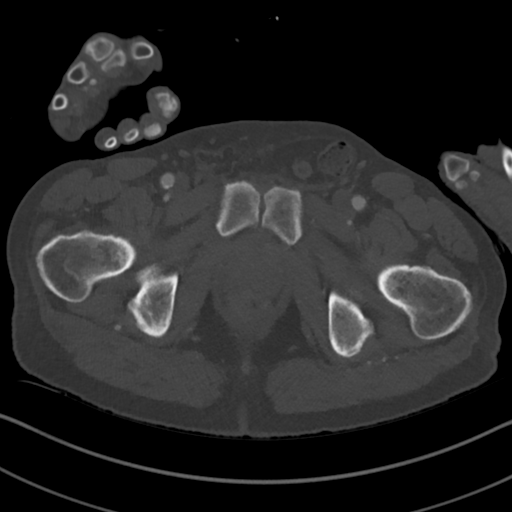
[im 51/214  soft-tissue]
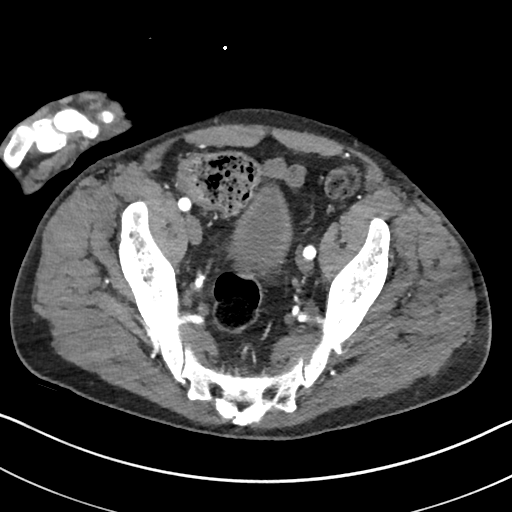
[im 76/214  soft-tissue]
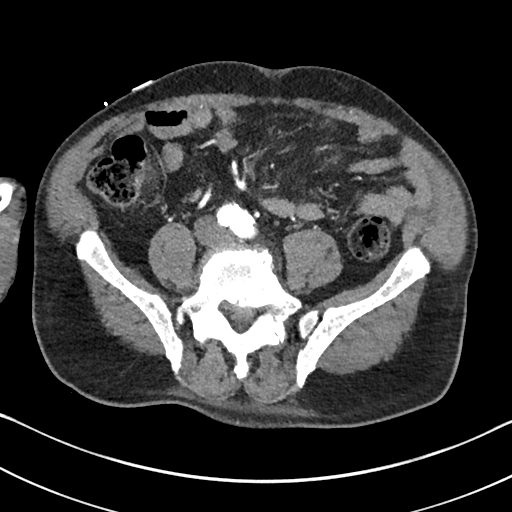
[im 101/214  soft-tissue]
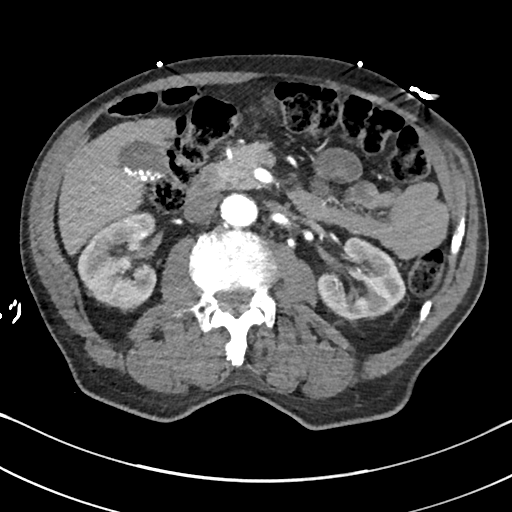
[im 126/214  soft-tissue]
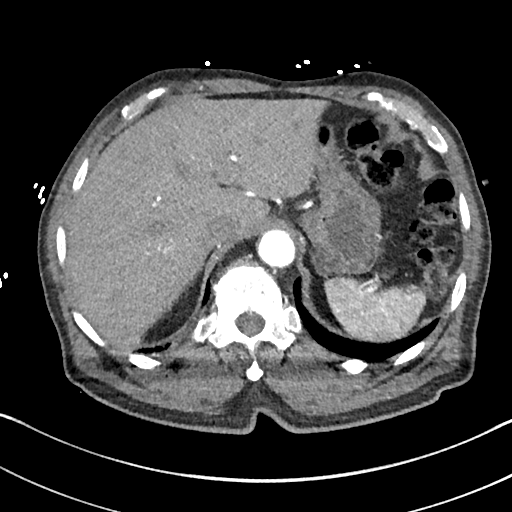
[im 151/214  soft-tissue]
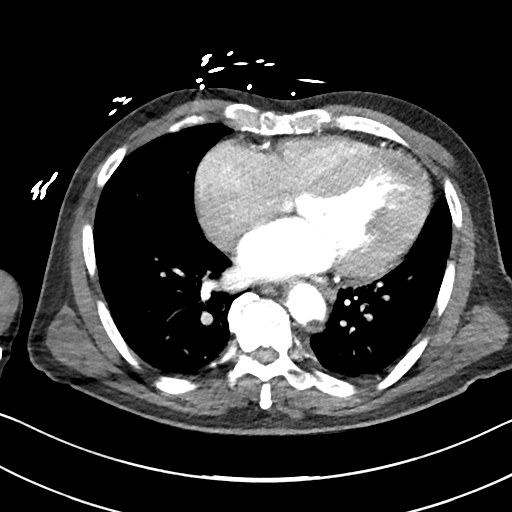
[im 176/214  soft-tissue]
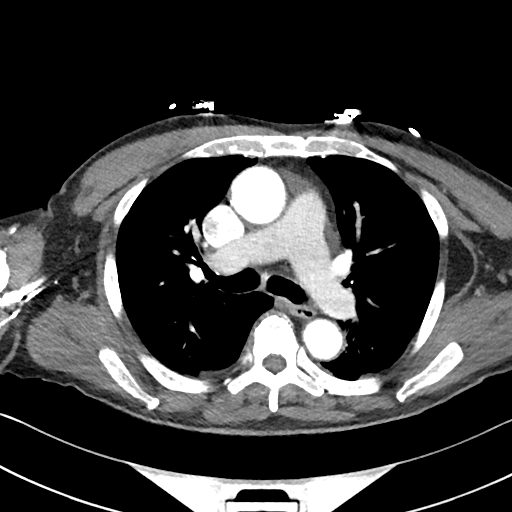
[im 201/214  soft-tissue]
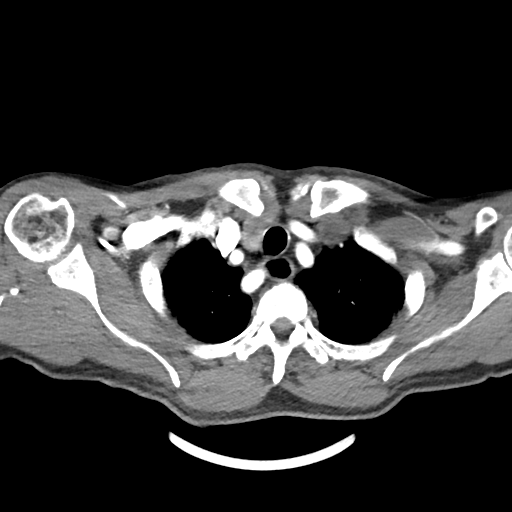

[Series 10: coronals · coronal · 0.72mm/px · 3 of 135 slices shown]
[im 34/135  soft-tissue]
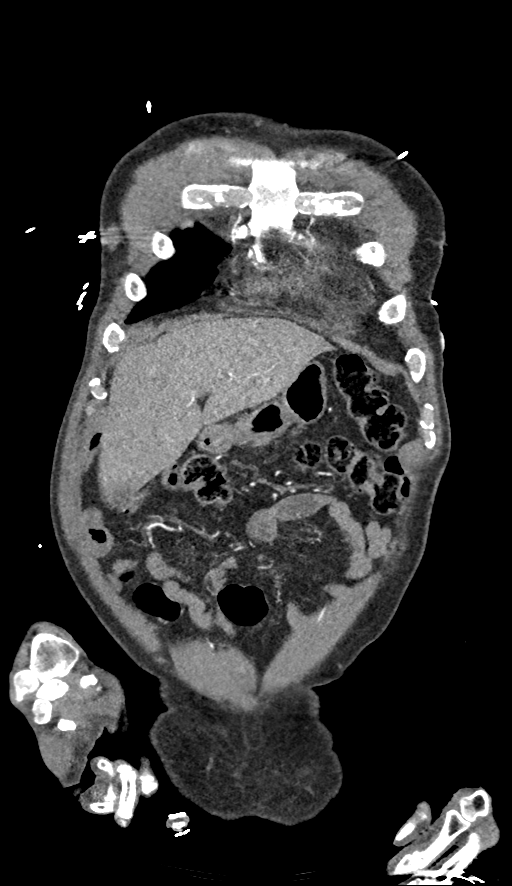
[im 68/135  soft-tissue]
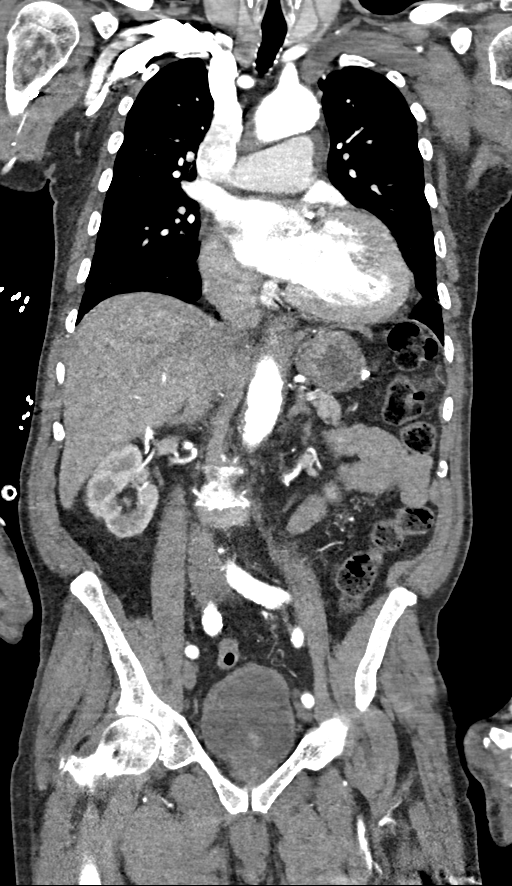
[im 101/135  soft-tissue]
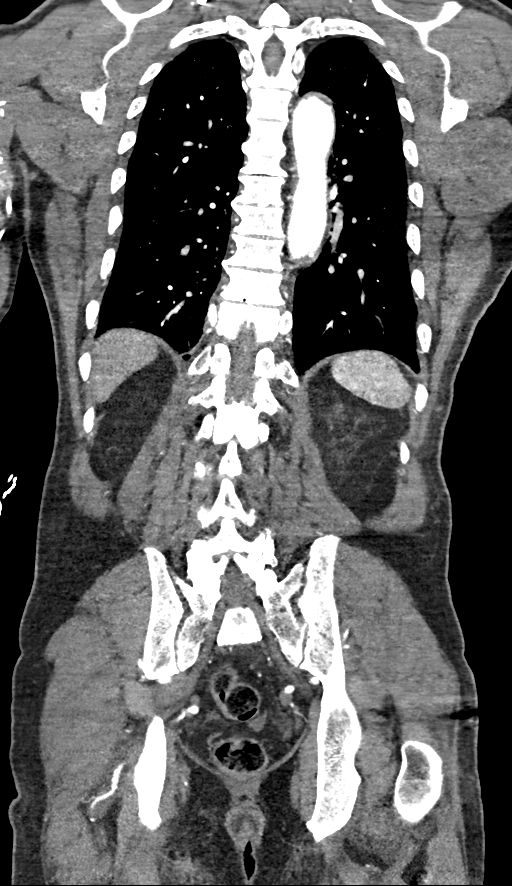

[11 of 46 positions shown; findings below may reference images not displayed]

Multidetector CT imaging through the chest, abdomen and pelvis was
performed using the standard protocol during bolus administration of
intravenous contrast. Multiplanar reconstructed images and MIPs were
obtained and reviewed to evaluate the vascular anatomy.

CONTRAST:  100mL OMNIPAQUE IOHEXOL 350 MG/ML SOLN
FINDINGS: CTA CHEST FINDINGS

Cardiovascular: Preferential opacification of the thoracic aorta. No
evidence of thoracic aortic aneurysm. Mild dilatation of the
transverse aortic arch measures 3.2 cm. Aberrant right subclavian
artery. Cardiac enlargement. No pericardial effusion.

Mediastinum/Nodes: No enlarged mediastinal, hilar, or axillary lymph
nodes. Thyroid gland, trachea, and esophagus demonstrate no
significant findings. Similar appearance of fluid attenuating
structure within the posterior mediastinum at the level of the
hiatus. This measures 2.7 x 2.5 cm, image 81/7. Previously this
measured 2.5 x 2.2 cm.

Lungs/Pleura: No pleural effusion. No airspace consolidation. There
is a part solid nodule within the superior segment of left lower
lobe measuring 2.2 by 1.6 cm with central solid component measuring
6 mm, image 34/9. Previously this was entirely non solid measuring
1.8 x 1.4 cm.

Musculoskeletal: No acute or suspicious osseous abnormality.
Spondylosis noted within the thoracic spine.

Review of the MIP images confirms the above findings.

CTA ABDOMEN AND PELVIS FINDINGS

VASCULAR

Aorta: Normal caliber aorta without aneurysm, dissection, vasculitis
or significant stenosis. Aortic atherosclerosis.

Celiac: Patent without evidence of aneurysm, dissection, vasculitis
or significant stenosis.

SMA: Patent without evidence of aneurysm, dissection, vasculitis or
significant stenosis.

Renals: Both renal arteries are patent without evidence of aneurysm,
dissection, vasculitis, fibromuscular dysplasia or significant
stenosis.

IMA: Patent without evidence of aneurysm, dissection, vasculitis or
significant stenosis.

Inflow: Patent without evidence of aneurysm, dissection, vasculitis
or significant stenosis.

Veins: No obvious venous abnormality within the limitations of this
arterial phase study.

Review of the MIP images confirms the above findings.

NON-VASCULAR

Hepatobiliary: Several liver cysts are again noted and appears
similar to previous exam. No suspicious liver abnormality. Multiple
stones identified within the gallbladder which measures up to 9 mm.
No gallbladder wall thickening or pericholecystic fluid. No bile
duct dilatation identified.

Pancreas: Unremarkable. No pancreatic ductal dilatation or
surrounding inflammatory changes.

Spleen: Normal in size without focal abnormality.

Adrenals/Urinary Tract: Left kidney cysts are noted. The largest
arises from the upper pole measuring 3.2 cm. Additionally there are
several, less than 1 cm, too small to reliably characterize renal
hypodensities. There is a subtle, slightly exophytic soft tissue
attenuating lesion arising off the medial cortex of the upper pole
of left kidney. This measures 1.2 cm with mean Hounsfield units of
87, image 87/12 and image 101/7. No hydronephrosis identified
bilaterally. Urinary bladder is unremarkable.

Stomach/Bowel: The stomach is nondistended. The appendix is not
confidently identified separate from the right lower quadrant bowel
loops. No bowel wall thickening, inflammation, or distension.

Lymphatic: There is mild hazy soft tissue within the porta hepatic
region surrounding the replaced right hepatic artery measuring
approximately 2.2 x 1.9 cm, image 103/7. No retroperitoneal,
mesenteric, or pelvic adenopathy.

Reproductive: Prostate gland is enlarged and has mass effect upon
the base of bladder.

Other: Left inguinal hernia contains nonobstructed loop of large
bowel. No free fluid or fluid collections identified.

Musculoskeletal: Pagetoid changes are identified involving the L3
vertebral body. Well-defined, nonaggressive appearing sclerotic
lesion within L5 is favored to represent a large bone island. This
measures approximately 1.5 cm. No acute or suspicious osseous
findings.

Review of the MIP images confirms the above findings.
IMPRESSION: 1. No evidence for aortic dissection or aneurysm.
2. There is mild hazy soft tissue within the porta hepatic region
which encases the replaced right hepatic artery. Etiology is
indeterminate. Differential considerations include confluent
inflammation or infiltrative neoplasm. In the absence of signs or
symptoms of inflammation/infection further investigation with
abdominal MRI without and with contrast material is recommended.
3. There is a subtle, slightly exophytic soft tissue attenuating
lesion arising off the medial cortex of the upper pole of left
kidney. This is indeterminate and may represent a
hemorrhagic/proteinaceous cyst versus solid enhancing neoplasm.
Further evaluation with contrast enhanced abdominal MRI is
recommended.
4. There is a part solid nodule within the superior segment of left
lower lobe measuring 2.2 cm with central solid component measuring 6
mm. Previously this was entirely non solid measuring 1.8 x 1.4 cm.
This is considered highly suspicious for low-grade indolent
pulmonary neoplasm such as adenocarcinoma. This recommendation
follows the consensus statement: Guidelines for Management of
Incidental Pulmonary Nodules Detected on CT Images: From the
5. Aberrant right subclavian artery.
6. Gallstones.
7. Left inguinal hernia contains nonobstructed loop of large bowel.
8. Enlarged prostate gland with mass effect upon the base of
bladder.
9. Pagetoid changes involving the L3 vertebral body.
10. Aortic atherosclerosis.

Aortic Atherosclerosis ([HF]-[HF]).

## 2020-07-29 IMAGING — CR DG CHEST 2V
2 series · 2 of 2 positions shown · non-contrast
Comparison: [DATE]

CLINICAL DATA: Chest pain and hypertension

EXAM:
CHEST - 2 VIEW

[chest pa]
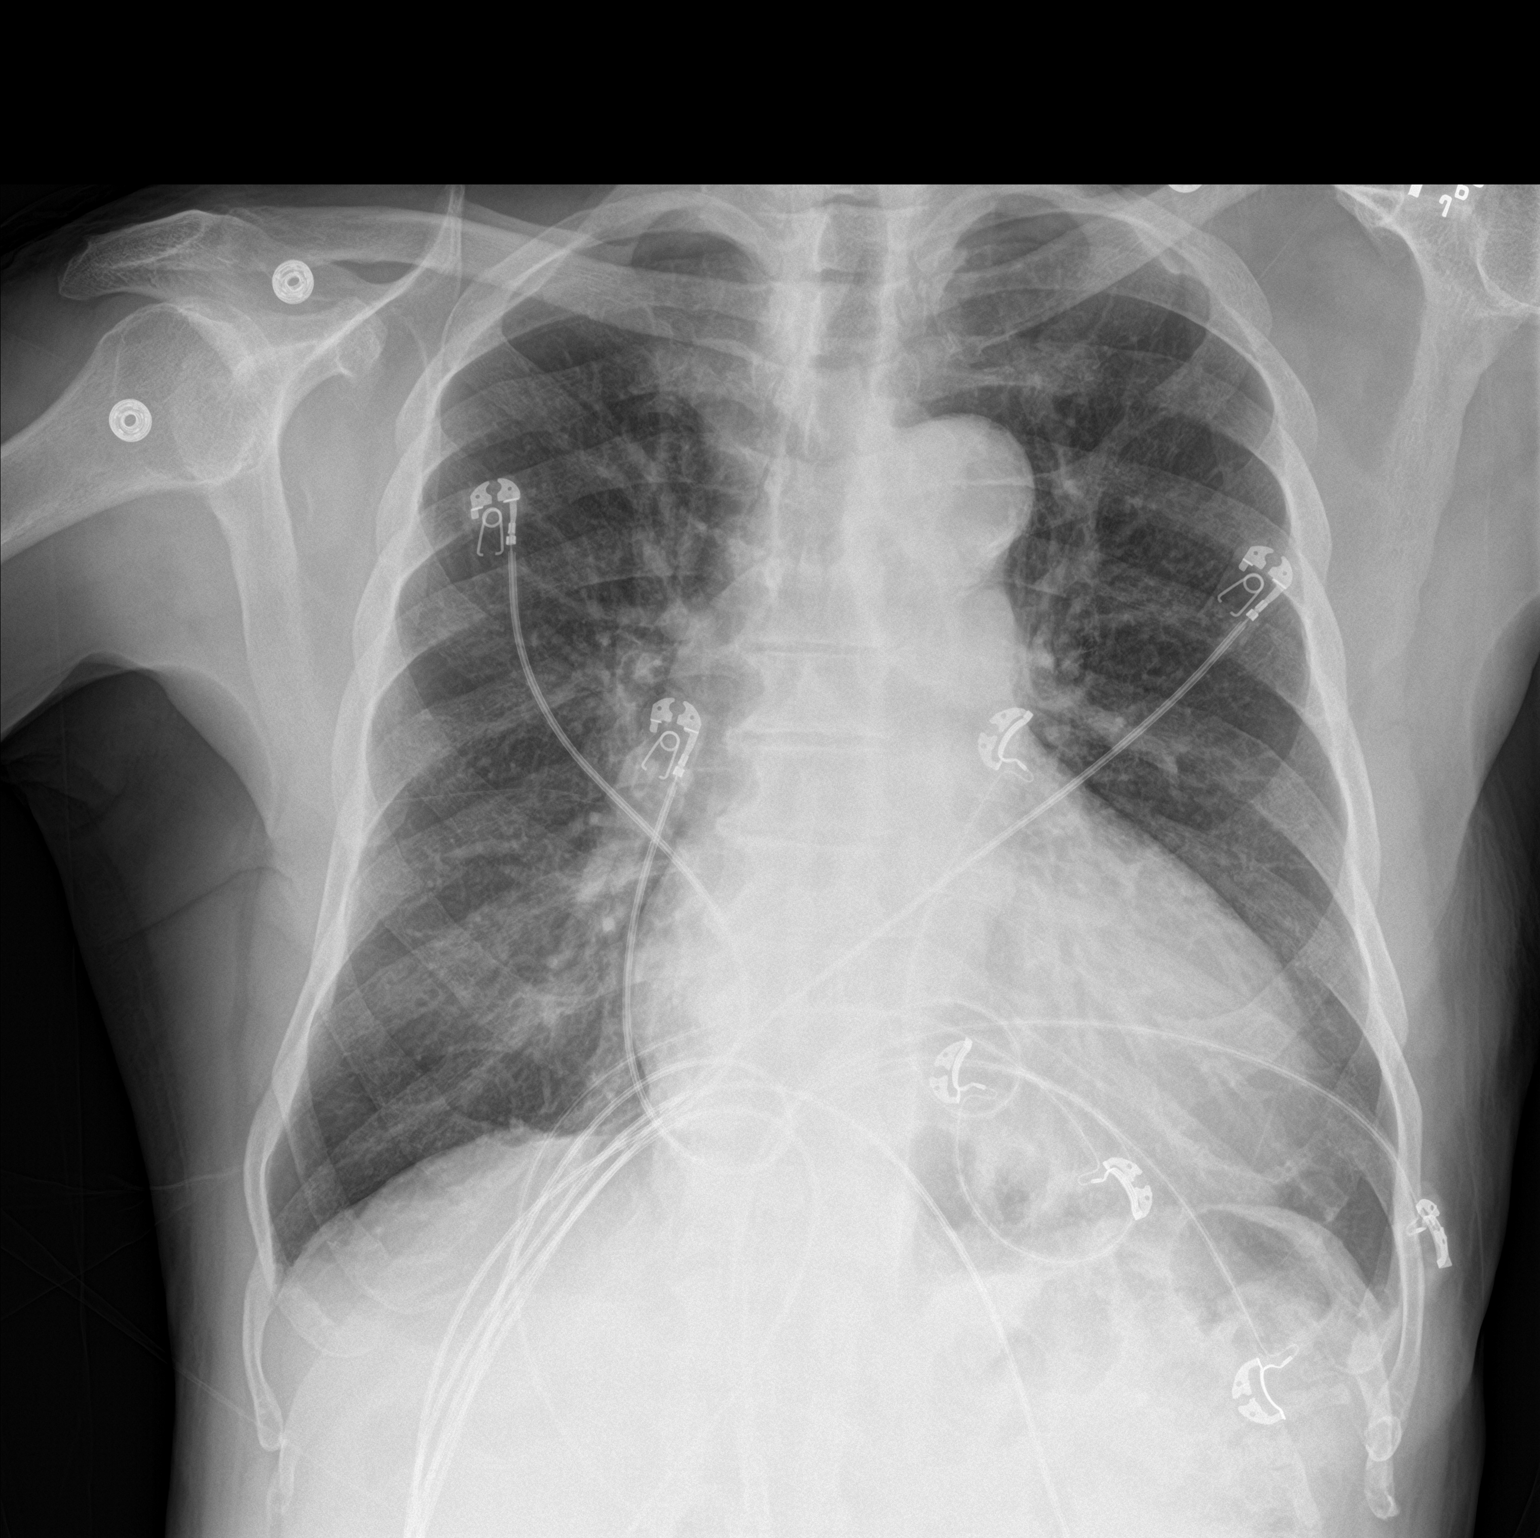

[chest lat]
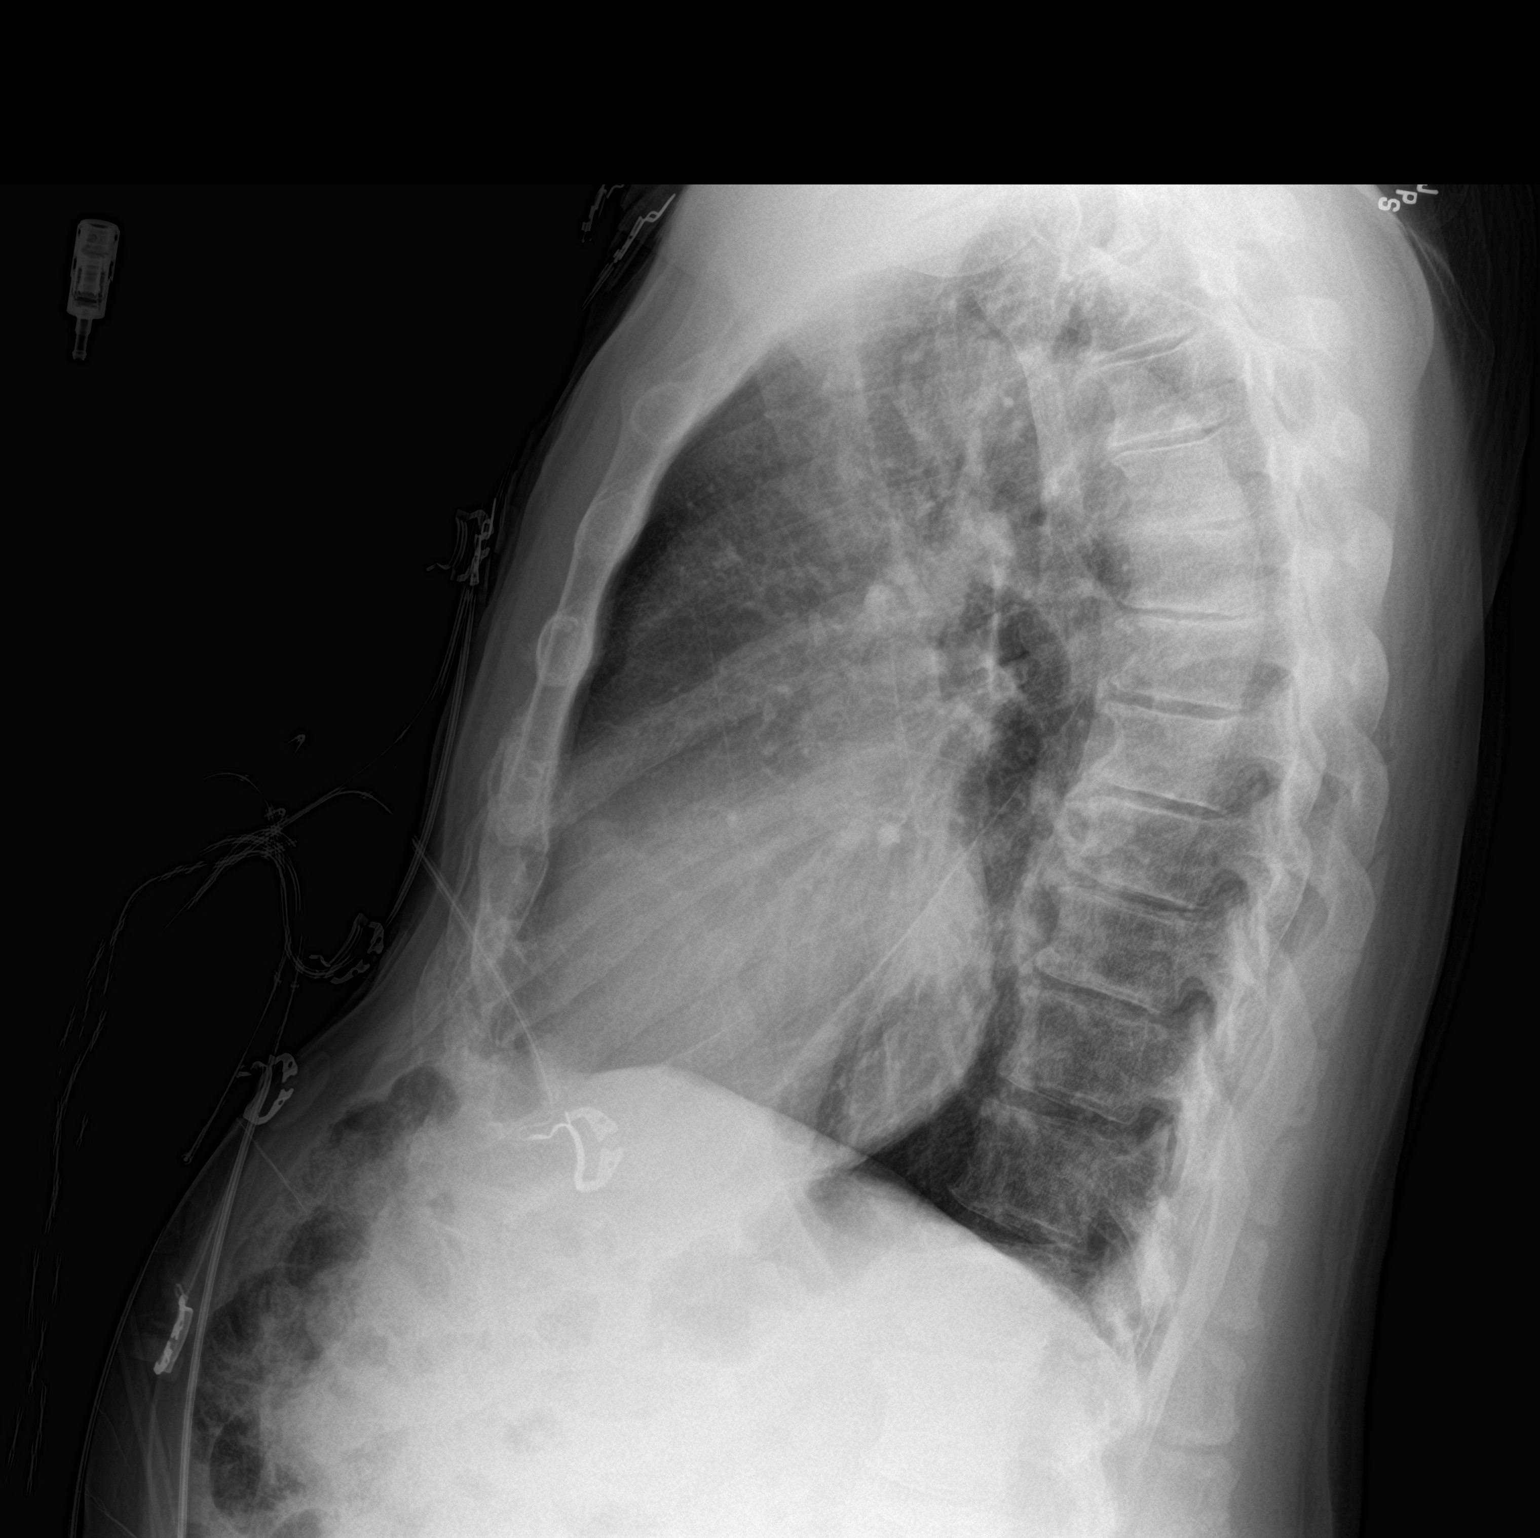

[2 of 2 positions shown; findings below may reference images not displayed]

FINDINGS: Lungs are clear. Heart is mildly enlarged with pulmonary vascularity
normal. No adenopathy. There is aortic atherosclerosis. No bone
lesions.
IMPRESSION: Mild cardiomegaly. Lungs clear. Aortic Atherosclerosis
([HP]-[HP]).

## 2020-07-29 IMAGING — CT CT HEAD W/O CM
3 series · 16 of 47 positions shown, 19 images · non-contrast
Comparison: None.

CLINICAL DATA: Left facial numbness.

EXAM:
CT HEAD WITHOUT CONTRAST
TECHNIQUE: Contiguous axial images were obtained from the base of the skull
through the vertex without intravenous contrast.

[Series 3: head 5.0 h30s · axial · 0.44mm/px · z∈[+137,+272]mm · 10 of 33 slices shown, 13 images]
[im 3/33  brain]
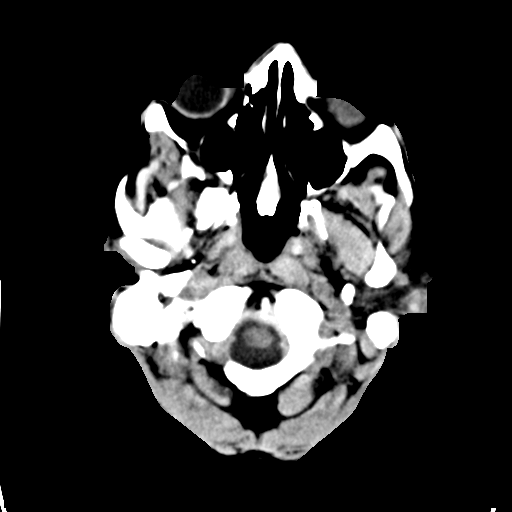
[im 3/33  bone]
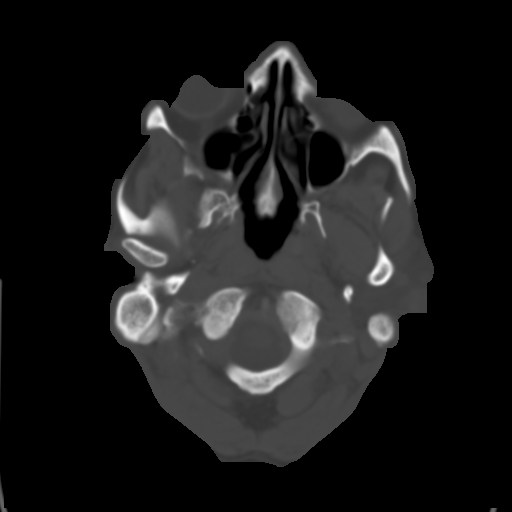
[im 6/33  brain]
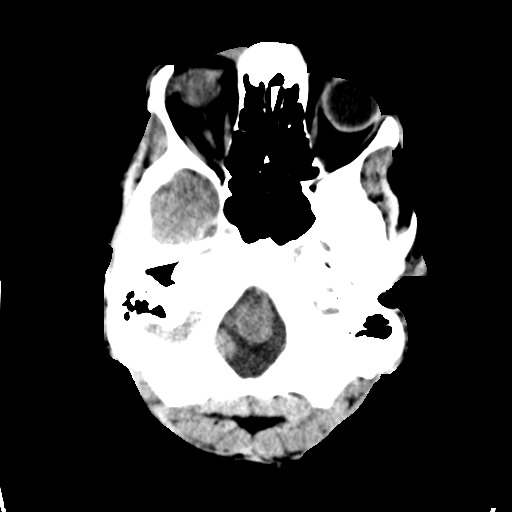
[im 9/33  brain]
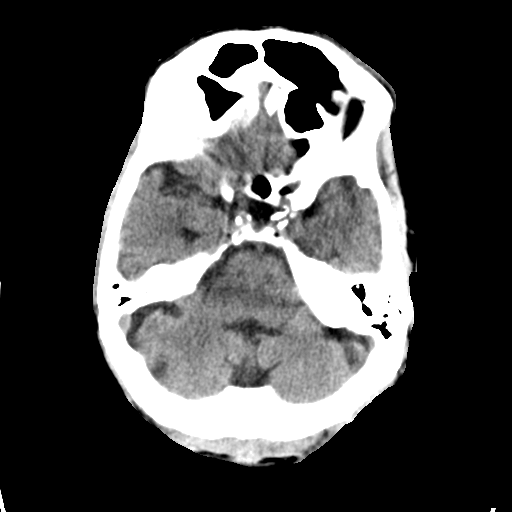
[im 12/33  brain]
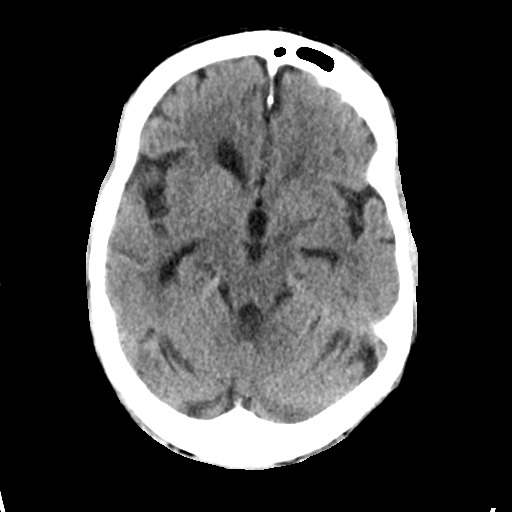
[im 15/33  brain]
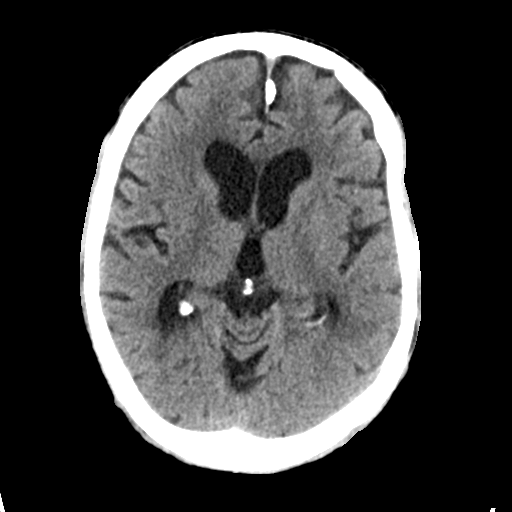
[im 15/33  bone]
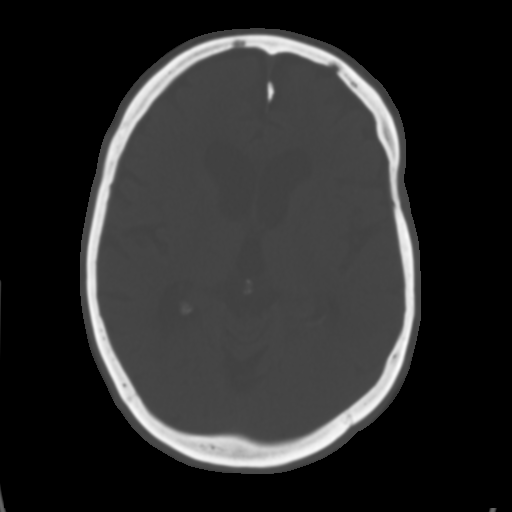
[im 18/33  brain]
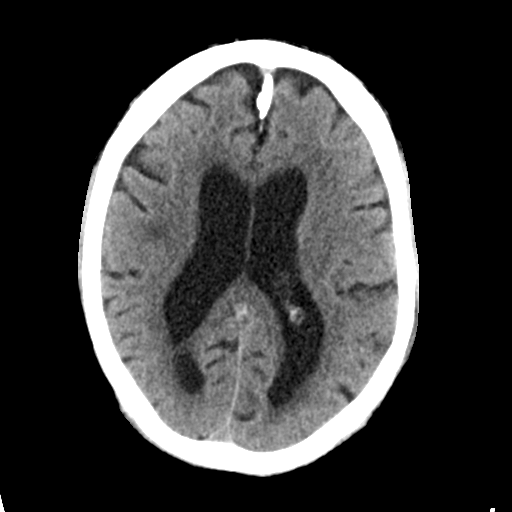
[im 21/33  brain]
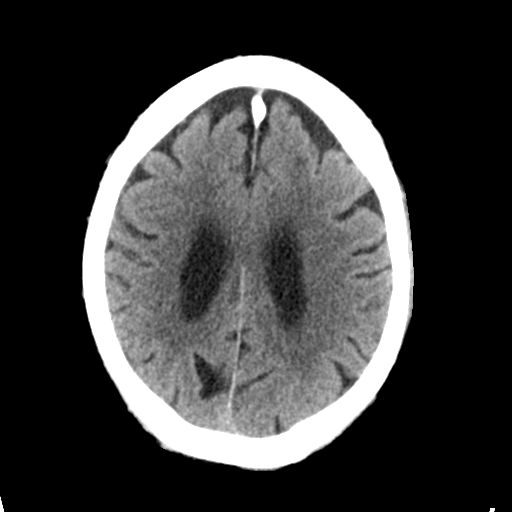
[im 25/33  brain]
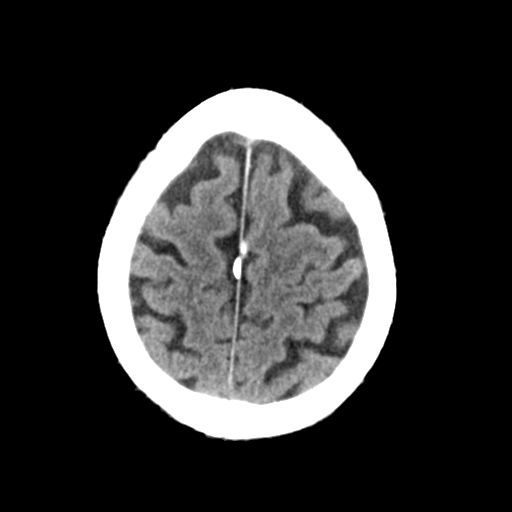
[im 27/33  brain]
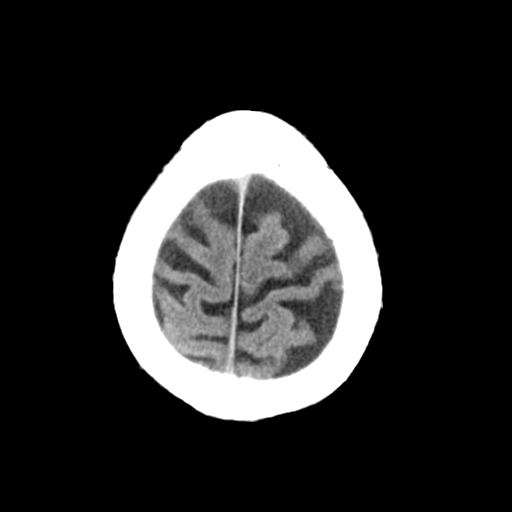
[im 27/33  bone]
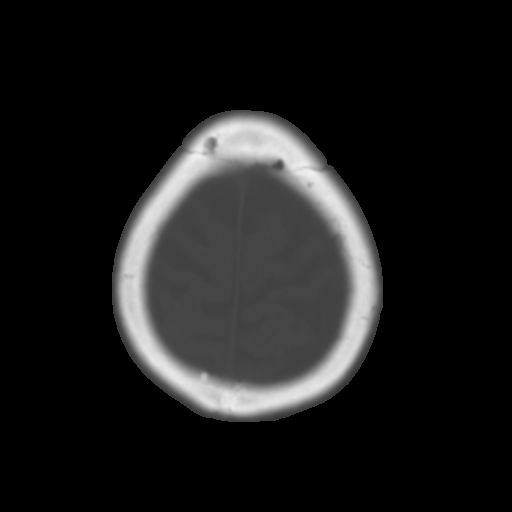
[im 30/33  brain]
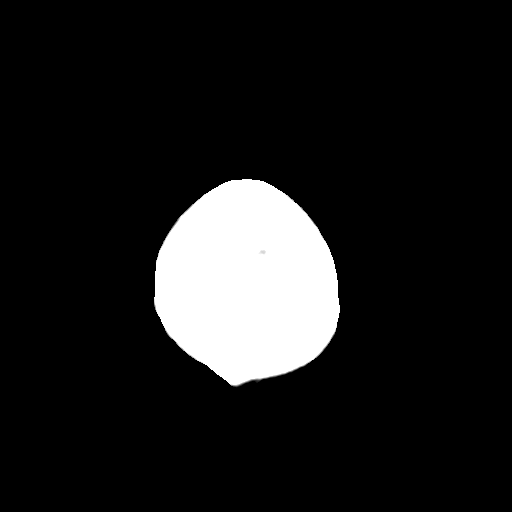

[Series 5: head 3.0 mpr cor · coronal · 0.35mm/px · 3 of 75 slices shown]
[im 25/75  brain]
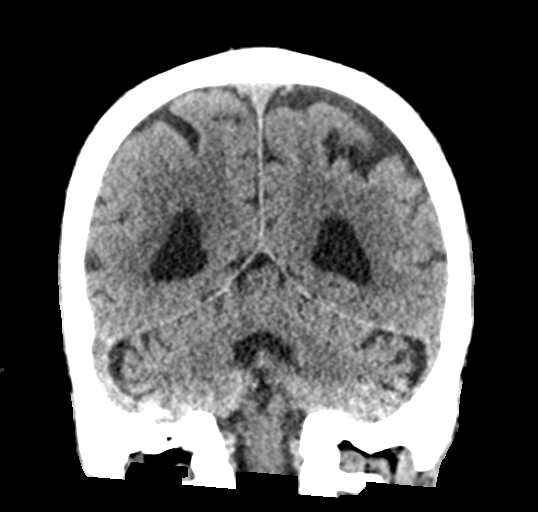
[im 33/75  brain]
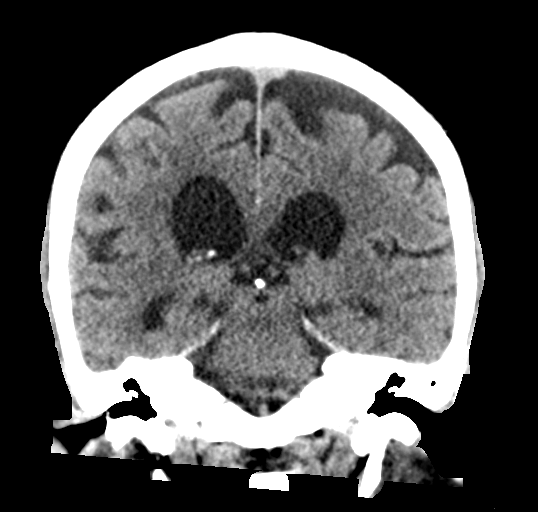
[im 42/75  brain]
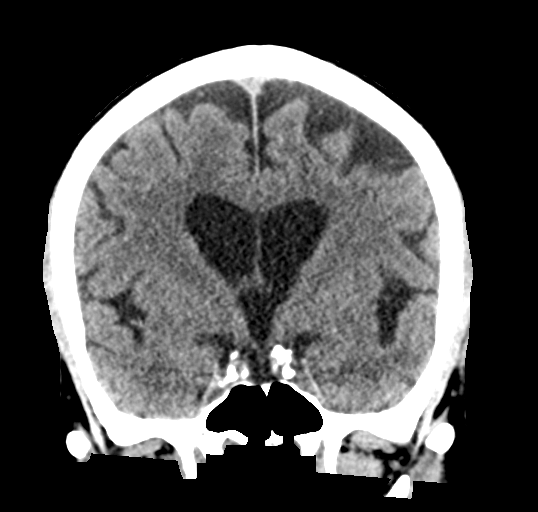

[Series 6: head 3.0 mpr sag · sagittal · 0.33mm/px · 3 of 59 slices shown]
[im 20/59  brain]
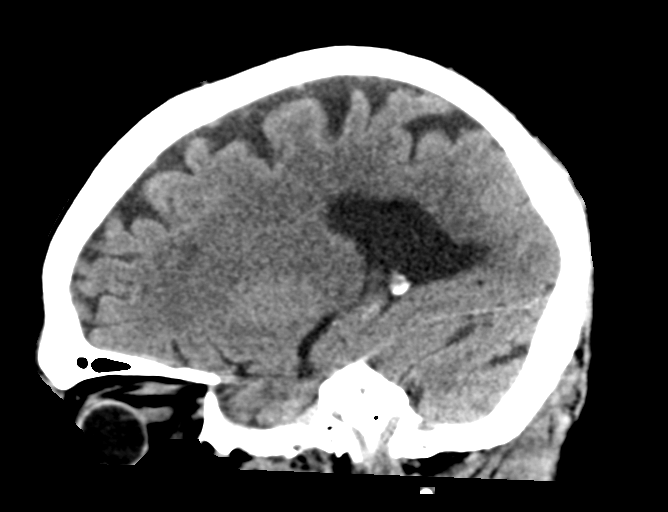
[im 30/59  brain]
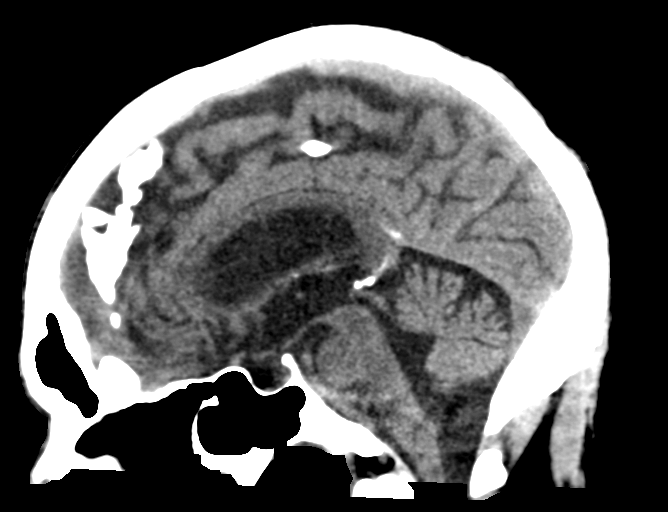
[im 39/59  brain]
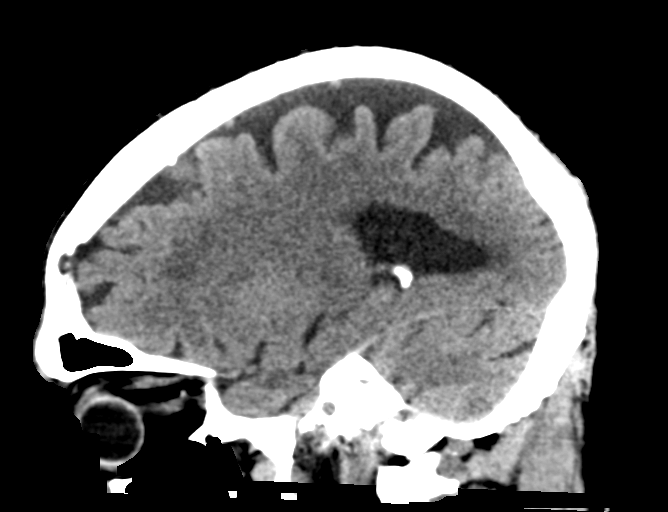

[16 of 47 positions shown; findings below may reference images not displayed]

FINDINGS: Brain: Mild diffuse cortical atrophy is noted. Mild chronic ischemic
white matter disease is noted. No mass effect or midline shift is
noted. Ventricular size is within normal limits. There is no
evidence of mass lesion, hemorrhage or acute infarction.

Vascular: No hyperdense vessel or unexpected calcification.

Skull: Normal. Negative for fracture or focal lesion.

Sinuses/Orbits: No acute finding.

Other: None.
IMPRESSION: Mild diffuse cortical atrophy. Mild chronic ischemic white matter
disease. No acute intracranial abnormality seen.

## 2020-07-29 MED ORDER — AMLODIPINE BESYLATE 10 MG PO TABS
10.0000 mg | ORAL_TABLET | Freq: Every day | ORAL | Status: DC
Start: 2020-07-29 — End: 2020-08-04
  Administered 2020-07-29 – 2020-08-04 (×7): 10 mg via ORAL
  Filled 2020-07-29 (×7): qty 1

## 2020-07-29 MED ORDER — LABETALOL HCL 5 MG/ML IV SOLN
10.0000 mg | Freq: Once | INTRAVENOUS | Status: AC
  Administered 2020-07-29: 10 mg via INTRAVENOUS
  Filled 2020-07-29: qty 4

## 2020-07-29 MED ORDER — HYDRALAZINE HCL 20 MG/ML IJ SOLN: 10.0000 mg | Freq: Once | INTRAMUSCULAR | Status: DC

## 2020-07-29 MED ORDER — ACETAMINOPHEN 325 MG PO TABS
650.0000 mg | ORAL_TABLET | Freq: Every day | ORAL | Status: DC | PRN
  Administered 2020-08-01 – 2020-08-04 (×2): 650 mg via ORAL
  Filled 2020-07-29 (×3): qty 2

## 2020-07-29 MED ORDER — PANTOPRAZOLE SODIUM 40 MG PO TBEC
40.0000 mg | DELAYED_RELEASE_TABLET | Freq: Every day | ORAL | Status: DC
  Administered 2020-07-29: 40 mg via ORAL
  Filled 2020-07-29: qty 1

## 2020-07-29 MED ORDER — HYDRALAZINE HCL 20 MG/ML IJ SOLN
5.0000 mg | Freq: Once | INTRAMUSCULAR | Status: AC
  Administered 2020-07-29: 5 mg via INTRAVENOUS
  Filled 2020-07-29: qty 1

## 2020-07-29 MED ORDER — HYDRALAZINE HCL 25 MG PO TABS
25.0000 mg | ORAL_TABLET | Freq: Four times a day (QID) | ORAL | Status: DC | PRN
  Administered 2020-07-29 – 2020-07-30 (×3): 25 mg via ORAL
  Filled 2020-07-29 (×3): qty 1

## 2020-07-29 MED ORDER — ALLOPURINOL 100 MG PO TABS
200.0000 mg | ORAL_TABLET | Freq: Every day | ORAL | Status: DC
Start: 2020-07-29 — End: 2020-08-04
  Administered 2020-07-29 – 2020-08-04 (×7): 200 mg via ORAL
  Filled 2020-07-29 (×8): qty 2

## 2020-07-29 MED ORDER — IOHEXOL 350 MG/ML SOLN
100.0000 mL | Freq: Once | INTRAVENOUS | Status: AC | PRN
  Administered 2020-07-29: 100 mL via INTRAVENOUS

## 2020-07-29 MED ORDER — CARVEDILOL 3.125 MG PO TABS
3.1250 mg | ORAL_TABLET | Freq: Two times a day (BID) | ORAL | Status: DC
  Administered 2020-07-30 – 2020-08-04 (×12): 3.125 mg via ORAL
  Filled 2020-07-29 (×12): qty 1

## 2020-07-29 MED ORDER — SIMVASTATIN 20 MG PO TABS
20.0000 mg | ORAL_TABLET | Freq: Every day | ORAL | Status: DC
  Administered 2020-07-29 – 2020-08-01 (×4): 20 mg via ORAL
  Filled 2020-07-29 (×4): qty 1

## 2020-07-29 MED ORDER — POLYVINYL ALCOHOL 1.4 % OP SOLN
1.0000 [drp] | Freq: Every day | OPHTHALMIC | Status: DC | PRN
  Filled 2020-07-29: qty 15

## 2020-07-29 NOTE — ED Triage Notes (Signed)
Pt presents today with c/o of pain to left abdomen x1 month. But also reports numbness to left face/jaw and left hand x 1 month. His PCP retired one year ago and he has not been taking BP meds.

## 2020-07-29 NOTE — ED Provider Notes (Signed)
[facility] EMERGENCY DEPARTMENT Provider Note   CSN: [id number] Arrival date & time: 07/29/20  1138     History No chief complaint on file.   Isaac Phelps is a 87 y.o. male.  HPI 87 year old male presents via EMS due to severely elevated blood pressure. He has been having on and off left abdominal/lower rib pain x 1 month. Comes and goes, is short in duration.  Nothing specific sets it off.  No vomiting or diarrhea.  He has also noticed left facial numbness like he received Novocain and a little bit of numbness in his left hand.  This does not involve the forehead.  The symptoms have been ongoing for about a month.  No vision changes.  No headache or chest pain/shortness of breath.  He used to be on blood pressure medicine but did not go back and thus has not been on his meds for over a year. The numbness is continuous.   Past Medical History:  Diagnosis Date  . Anxiety   . BENIGN PROSTATIC HYPERTROPHY 04/01/2008  . Coronary artery disease   . GERD (gastroesophageal reflux disease)   . HYPERLIPIDEMIA 05/26/2007  . HYPERTENSION 02/07/2007  . Syncope and collapse   . TOBACCO ABUSE 04/01/2008    Patient Active Problem List   Diagnosis Date Noted  . Anemia of chronic disease 05/07/2017  . Gouty arthritis 03/21/2016  . CKD (chronic kidney disease) stage 3, GFR 30-59 ml/min (HCC) 02/09/2015  . TOBACCO ABUSE 04/01/2008  . BENIGN PROSTATIC HYPERTROPHY 04/01/2008  . Dyslipidemia 05/26/2007  . Essential hypertension 02/07/2007    Past Surgical History:  Procedure Laterality Date  . APPENDECTOMY    . TONSILLECTOMY         Family History  Problem Relation Age of Onset  . Arthritis Neg Hx        family  . Hypertension Neg Hx        family  . Stroke Neg Hx        family , 1st degree relative    Social History   Tobacco Use  . Smoking status: Current Every Day Smoker    Packs/day: 0.30    Types: Cigarettes  . Smokeless tobacco: Never Used  Vaping  Use  . Vaping Use: Never used  Substance Use Topics  . Alcohol use: No  . Drug use: No    Home Medications Prior to Admission medications   Medication Sig Start Date End Date Taking? Authorizing Provider  allopurinol (ZYLOPRIM) 100 MG tablet TAKE 2 TABLETS BY MOUTH EVERY DAY 09/15/18   Nafziger, Tommi Rumps, NP  amLODipine (NORVASC) 10 MG tablet Take 1 tablet (10 mg total) by mouth daily. 04/15/18   Nafziger, Tommi Rumps, NP  losartan (COZAAR) 100 MG tablet Take 1 tablet (100 mg total) by mouth daily. 06/06/18   Nafziger, Tommi Rumps, NP  simvastatin (ZOCOR) 40 MG tablet Take 0.5 tablets (20 mg total) by mouth daily. 06/06/18   Nafziger, Tommi Rumps, NP    Allergies    Sulfa antibiotics  Review of Systems   Review of Systems  Eyes: Negative for visual disturbance.  Respiratory: Negative for shortness of breath.   Cardiovascular: Negative for chest pain.  Gastrointestinal: Positive for abdominal pain. Negative for diarrhea and vomiting.  Genitourinary: Positive for flank pain. Negative for dysuria.  Neurological: Positive for numbness. Negative for speech difficulty, weakness and headaches.  All other systems reviewed and are negative.   Physical Exam Updated Vital Signs BP (!) 236/86  Pulse (!) 54   Temp 98 F (36.7 C) (Oral)   Resp (!) 24   SpO2 100%   Physical Exam Vitals and nursing note reviewed.  Constitutional:      Appearance: He is well-developed and well-nourished.  HENT:     Head: Normocephalic and atraumatic.     Right Ear: External ear normal.     Left Ear: External ear normal.     Nose: Nose normal.  Eyes:     General:        Right eye: No discharge.        Left eye: No discharge.     Extraocular Movements: Extraocular movements intact.  Cardiovascular:     Rate and Rhythm: Normal rate and regular rhythm.     Pulses:          Radial pulses are 2+ on the left side.     Heart sounds: Normal heart sounds.  Pulmonary:     Effort: Pulmonary effort is normal.     Breath sounds:  Normal breath sounds.  Abdominal:     General: There is no distension.     Palpations: Abdomen is soft.     Tenderness: There is no abdominal tenderness. There is no right CVA tenderness or left CVA tenderness.  Genitourinary:    Comments: No obvious hemorrhoids on exam. Brown stool but also a small amount of blood on DRE Musculoskeletal:        General: No edema.     Cervical back: Neck supple.  Skin:    General: Skin is warm and dry.  Neurological:     Mental Status: He is alert.     Comments: CN 3-12 grossly intact save for subjective decreased sensation in left perioral face and lateral to left nose. 5/5 strength in all 4 extremities. Grossly normal sensation in extremities. Normal finger to nose.   Psychiatric:        Mood and Affect: Mood is not anxious.     ED Results / Procedures / Treatments   Labs (all labs ordered are listed, but only abnormal results are displayed) Labs Reviewed  COMPREHENSIVE METABOLIC PANEL - Abnormal; Notable for the following components:      Result Value   CO2 21 (*)    Creatinine, Ser 1.77 (*)    GFR, Estimated 37 (*)    All other components within normal limits  CBC WITH DIFFERENTIAL/PLATELET - Abnormal; Notable for the following components:   RBC 3.73 (*)    Hemoglobin 8.3 (*)    HCT 27.8 (*)    MCV 74.5 (*)    MCH 22.3 (*)    MCHC 29.9 (*)    RDW 17.2 (*)    All other components within normal limits  URINALYSIS, ROUTINE W REFLEX MICROSCOPIC - Abnormal; Notable for the following components:   Color, Urine STRAW (*)    Protein, ur 100 (*)    All other components within normal limits  I-STAT CHEM 8, ED - Abnormal; Notable for the following components:   BUN 29 (*)    Creatinine, Ser 1.80 (*)    Hemoglobin 9.2 (*)    HCT 27.0 (*)    All other components within normal limits  POC OCCULT BLOOD, ED - Abnormal; Notable for the following components:   Fecal Occult Bld POSITIVE (*)    All other components within normal limits  TROPONIN I  (HIGH SENSITIVITY) - Abnormal; Notable for the following components:   Troponin I (High Sensitivity) 27 (*)  All other components within normal limits  TROPONIN I (HIGH SENSITIVITY) - Abnormal; Notable for the following components:   Troponin I (High Sensitivity) 36 (*)    All other components within normal limits  LIPASE, BLOOD  CBG MONITORING, ED    EKG EKG Interpretation  Date/Time:  Friday July 29 2020 12:04:41 EST Ventricular Rate:  60 PR Interval:    QRS Duration: 88 QT Interval:  435 QTC Calculation: 435 R Axis:   -17 Text Interpretation: Sinus rhythm Left atrial enlargement Left ventricular hypertrophy similar to earlier in the day Confirmed by Sherwood Gambler [phone]) on 07/29/2020 12:06:21 PM   Radiology DG Chest 2 View  Result Date: 07/29/2020 CLINICAL DATA:  Chest pain and hypertension EXAM: CHEST - 2 VIEW COMPARISON:  February 09, 2015 FINDINGS: Lungs are clear. Heart is mildly enlarged with pulmonary vascularity normal. No adenopathy. There is aortic atherosclerosis. No bone lesions. IMPRESSION: Mild cardiomegaly. Lungs clear. Aortic Atherosclerosis (ICD10-I70.0). Electronically Signed   By: Lowella Grip III M.D.   On: 07/29/2020 12:56   CT Head Wo Contrast  Result Date: 07/29/2020 CLINICAL DATA:  Left facial numbness. EXAM: CT HEAD WITHOUT CONTRAST TECHNIQUE: Contiguous axial images were obtained from the base of the skull through the vertex without intravenous contrast. COMPARISON:  None. FINDINGS: Brain: Mild diffuse cortical atrophy is noted. Mild chronic ischemic white matter disease is noted. No mass effect or midline shift is noted. Ventricular size is within normal limits. There is no evidence of mass lesion, hemorrhage or acute infarction. Vascular: No hyperdense vessel or unexpected calcification. Skull: Normal. Negative for fracture or focal lesion. Sinuses/Orbits: No acute finding. Other: None. IMPRESSION: Mild diffuse cortical atrophy. Mild chronic  ischemic white matter disease. No acute intracranial abnormality seen. Electronically Signed   By: Marijo Conception M.D.   On: 07/29/2020 14:51   CT Angio Chest/Abd/Pel for Dissection W and/or Wo Contrast  Result Date: 07/29/2020 CLINICAL DATA:  Abdominal pain. Evaluate for abdominal aortic dissection. EXAM: CT ANGIOGRAPHY CHEST, ABDOMEN AND PELVIS TECHNIQUE: Non-contrast CT of the chest was initially obtained. Multidetector CT imaging through the chest, abdomen and pelvis was performed using the standard protocol during bolus administration of intravenous contrast. Multiplanar reconstructed images and MIPs were obtained and reviewed to evaluate the vascular anatomy. CONTRAST:  155mL OMNIPAQUE IOHEXOL 350 MG/ML SOLN COMPARISON:  CT chest 08/07/2011 FINDINGS: CTA CHEST FINDINGS Cardiovascular: Preferential opacification of the thoracic aorta. No evidence of thoracic aortic aneurysm. Mild dilatation of the transverse aortic arch measures 3.2 cm. Aberrant right subclavian artery. Cardiac enlargement. No pericardial effusion. Mediastinum/Nodes: No enlarged mediastinal, hilar, or axillary lymph nodes. Thyroid gland, trachea, and esophagus demonstrate no significant findings. Similar appearance of fluid attenuating structure within the posterior mediastinum at the level of the hiatus. This measures 2.7 x 2.5 cm, image 81/7. Previously this measured 2.5 x 2.2 cm. Lungs/Pleura: No pleural effusion. No airspace consolidation. There is a part solid nodule within the superior segment of left lower lobe measuring 2.2 by 1.6 cm with central solid component measuring 6 mm, image 34/9. Previously this was entirely non solid measuring 1.8 x 1.4 cm. Musculoskeletal: No acute or suspicious osseous abnormality. Spondylosis noted within the thoracic spine. Review of the MIP images confirms the above findings. CTA ABDOMEN AND PELVIS FINDINGS VASCULAR Aorta: Normal caliber aorta without aneurysm, dissection, vasculitis or significant  stenosis. Aortic atherosclerosis. Celiac: Patent without evidence of aneurysm, dissection, vasculitis or significant stenosis. SMA: Patent without evidence of aneurysm, dissection, vasculitis or significant stenosis. Renals: Both  renal arteries are patent without evidence of aneurysm, dissection, vasculitis, fibromuscular dysplasia or significant stenosis. IMA: Patent without evidence of aneurysm, dissection, vasculitis or significant stenosis. Inflow: Patent without evidence of aneurysm, dissection, vasculitis or significant stenosis. Veins: No obvious venous abnormality within the limitations of this arterial phase study. Review of the MIP images confirms the above findings. NON-VASCULAR Hepatobiliary: Several liver cysts are again noted and appears similar to previous exam. No suspicious liver abnormality. Multiple stones identified within the gallbladder which measures up to 9 mm. No gallbladder wall thickening or pericholecystic fluid. No bile duct dilatation identified. Pancreas: Unremarkable. No pancreatic ductal dilatation or surrounding inflammatory changes. Spleen: Normal in size without focal abnormality. Adrenals/Urinary Tract: Left kidney cysts are noted. The largest arises from the upper pole measuring 3.2 cm. Additionally there are several, less than 1 cm, too small to reliably characterize renal hypodensities. There is a subtle, slightly exophytic soft tissue attenuating lesion arising off the medial cortex of the upper pole of left kidney. This measures 1.2 cm with mean Hounsfield units of 87, image 87/12 and image 101/7. No hydronephrosis identified bilaterally. Urinary bladder is unremarkable. Stomach/Bowel: The stomach is nondistended. The appendix is not confidently identified separate from the right lower quadrant bowel loops. No bowel wall thickening, inflammation, or distension. Lymphatic: There is mild hazy soft tissue within the porta hepatic region surrounding the replaced right hepatic  artery measuring approximately 2.2 x 1.9 cm, image 103/7. No retroperitoneal, mesenteric, or pelvic adenopathy. Reproductive: Prostate gland is enlarged and has mass effect upon the base of bladder. Other: Left inguinal hernia contains nonobstructed loop of large bowel. No free fluid or fluid collections identified. 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 Well-defined, nonaggressive appearing sclerotic lesion within L5 is favored to represent a large bone island. This measures approximately 1.5 cm. No acute or suspicious osseous findings. Review of the MIP images confirms the above findings. IMPRESSION: 1. No evidence for aortic dissection or aneurysm. 2. There is mild hazy soft tissue within the porta hepatic region which encases the replaced right hepatic artery. Etiology is indeterminate. Differential considerations include confluent inflammation or infiltrative neoplasm. In the absence of signs or symptoms of inflammation/infection further investigation with abdominal MRI without and with contrast material is recommended. 3. There is a subtle, slightly exophytic soft tissue attenuating lesion arising off the medial cortex of the upper pole of left kidney. This is indeterminate and may represent a hemorrhagic/proteinaceous cyst versus solid enhancing neoplasm. Further evaluation with contrast enhanced abdominal MRI is recommended. 4. There is a part solid nodule within the superior segment of left lower lobe measuring 2.2 cm with central solid component measuring 6 mm. Previously this was entirely non solid measuring 1.8 x 1.4 cm. This is considered highly suspicious for low-grade indolent pulmonary neoplasm such as adenocarcinoma. This recommendation follows the consensus statement: Guidelines for Management of Incidental Pulmonary Nodules Detected on CT Images: From the Fleischner Society 2017; Radiology 2017; 284:228-243. 5. Aberrant right subclavian artery. 6.  Gallstones. 7. Left inguinal hernia contains nonobstructed loop of large bowel. 8. Enlarged prostate gland with mass effect upon the base of bladder. 9. 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 10. Aortic atherosclerosis. Aortic Atherosclerosis (ICD10-I70.0). Electronically Signed   By: Kerby Moors M.D.   On: 07/29/2020 15:17    Procedures Procedures   Medications Ordered in ED Medications  iohexol (OMNIPAQUE) 350 MG/ML injection 100 mL (100 mLs Intravenous Contrast Given 07/29/20 1426)  hydrALAZINE (APRESOLINE) injection 5 mg (  5 mg Intravenous Given 07/29/20 1515)    ED Course  I have reviewed the triage vital signs and the nursing notes.  Pertinent labs & imaging results that were available during my care of the patient were reviewed by me and considered in my medical decision making (see chart for details).    MDM Rules/Calculators/A&P                          Patient with hypertensive urgency. 1 month or more of numbness, but no progressive symptoms or headache/weakness. CT head unremarkable. Given LUQ/lower chest pain, CT dissection study obtained. Will need IV hypertension control and admission given no follow up as outpatient and overall symptoms. Hgb a little low compared to baseline, some mild blood on DRE. Will need admission with repeat H/H. Care to Dr. Rex Kras.  Final Clinical Impression(s) / ED Diagnoses Final diagnoses:  None    Rx / DC Orders ED Discharge Orders    None       Sherwood Gambler, MD 07/29/20 [phone]

## 2020-07-29 NOTE — ED Provider Notes (Addendum)
I received this patient in signout from Dr. Regenia Skeeter.  He had presented with hypertensive urgency and awaiting a CTA to evaluate for dissection or other acute process.  Had received 5 mg IV hydralazine for blood pressure.  No significant response with hydralazine, gave 10 mg labetalol with improvement to 170s/80s.  ETA negative for dissection.  He does have several incidental findings which will need future investigation with MRI with and without contrast including area and porta hepatic region, left kidney, and left lower lobe of lung.  Per Dr. Lula Olszewski report, he is Hemoccult positive but no evidence of acute GI bleed on exam.  Discussed admission with [facility] hospitalist, Dr. Roosevelt Locks.   Jamine Wingate, Wenda Overland, MD 07/29/20 1742    Rex Kras, Wenda Overland, MD 07/29/20 [phone]

## 2020-07-29 NOTE — ED Notes (Signed)
Pt transport to CT  

## 2020-07-29 NOTE — ED Notes (Signed)
Pt complaining of left side facial numbness. No numbness anywhere else. Also complains of abd pain.

## 2020-07-29 NOTE — H&P (Addendum)
History and Physical    Isaac Phelps SVX:[id number] DOB: 26-Apr-1931 DOA: 07/29/2020  PCP: Dorothyann Peng, NP (Confirm with patient/family/NH records and if not entered, this has to be entered at [facility] point of entry) Patient coming from: Home  I have personally briefly reviewed patient's old medical records in [place]  Chief Complaint: Chest pain  HPI: Isaac Phelps is a 87 y.o. male with medical history significant of HTN noncompliant with medications, CKD stage III, GERD, gout, presented with chest pain/rib pain and epigastric pain for 1 month.  Patient has been having left-sided rib cage pain on and off for 1 month, most occasions happened at night when sleeping, aching like, with tenderness, no fever chills no cough. He has been taking PRN Tylenol and Advil 200 mg x 2 every other day since last months, and get some relief. Today at urgent care, was found his blood pressure 240/100 and sent to ED. denies any headache no nauseous vomiting no abdominal pain. He further denied any lightheadedness shortness of breath. No abdominal pain associated with eating. And no dark-colored stool. ED Course: Blood pressure remains high, responded to labetalol. CT angiogram negative for dissection or aneurysm findings of suspicious lesions in liver kidney.  Review of Systems: As per HPI otherwise 14 point review of systems negative.    Past Medical History:  Diagnosis Date  . Anxiety   . BENIGN PROSTATIC HYPERTROPHY 04/01/2008  . Coronary artery disease   . GERD (gastroesophageal reflux disease)   . HYPERLIPIDEMIA 05/26/2007  . HYPERTENSION 02/07/2007  . Syncope and collapse   . TOBACCO ABUSE 04/01/2008    Past Surgical History:  Procedure Laterality Date  . APPENDECTOMY    . TONSILLECTOMY       reports that he has been smoking cigarettes. He has been smoking about 0.30 packs per day. He has never used smokeless tobacco. He reports that he does not drink alcohol and does not use  drugs.  Allergies  Allergen Reactions  . Sulfa Antibiotics     Family History  Problem Relation Age of Onset  . Arthritis Neg Hx        family  . Hypertension Neg Hx        family  . Stroke Neg Hx        family , 1st degree relative     Prior to Admission medications   Medication Sig Start Date End Date Taking? Authorizing Provider  allopurinol (ZYLOPRIM) 100 MG tablet TAKE 2 TABLETS BY MOUTH EVERY DAY 09/15/18   Nafziger, Tommi Rumps, NP  amLODipine (NORVASC) 10 MG tablet Take 1 tablet (10 mg total) by mouth daily. 04/15/18   Nafziger, Tommi Rumps, NP  losartan (COZAAR) 100 MG tablet Take 1 tablet (100 mg total) by mouth daily. 06/06/18   Nafziger, Tommi Rumps, NP  simvastatin (ZOCOR) 40 MG tablet Take 0.5 tablets (20 mg total) by mouth daily. 06/06/18   Dorothyann Peng, NP    Physical Exam: Vitals:   07/29/20 1600 07/29/20 1630 07/29/20 1645 07/29/20 1700  BP: (!) 192/99 (!) 173/88 (!) 177/86 (!) 178/86  Pulse: 79 62 64 65  Resp: 20 17 14 16   Temp:      TempSrc:      SpO2: 100% 98% 100% 100%    Constitutional: NAD, calm, comfortable Vitals:   07/29/20 1600 07/29/20 1630 07/29/20 1645 07/29/20 1700  BP: (!) 192/99 (!) 173/88 (!) 177/86 (!) 178/86  Pulse: 79 62 64 65  Resp: 20 17 14 16   Temp:  TempSrc:      SpO2: 100% 98% 100% 100%   Eyes: PERRL, lids and conjunctivae normal ENMT: Mucous membranes are moist. Posterior pharynx clear of any exudate or lesions.Normal dentition.  Neck: normal, supple, no masses, no thyromegaly Respiratory: clear to auscultation bilaterally, no wheezing, no crackles. Normal respiratory effort. No accessory muscle use.  Cardiovascular: Regular rate and rhythm, no murmurs / rubs / gallops. No extremity edema. 2+ pedal pulses. No carotid bruits.  Abdomen: no tenderness, no masses palpated. No hepatosplenomegaly. Bowel sounds positive.  Musculoskeletal: no clubbing / cyanosis. No joint deformity upper and lower extremities. Good ROM, no contractures. Normal  muscle tone. Tenderness on left lower rib cage Skin: no rashes, lesions, ulcers. No induration Neurologic: CN 2-12 grossly intact. Sensation intact, DTR normal. Strength 5/5 in all 4.  Psychiatric: Normal judgment and insight. Alert and oriented x 3. Normal mood.     Labs on Admission: I have personally reviewed following labs and imaging studies  CBC: Recent Labs  Lab 07/29/20 1204 07/29/20 1248  WBC 6.1  --   NEUTROABS 4.6  --   HGB 8.3* 9.2*  HCT 27.8* 27.0*  MCV 74.5*  --   PLT 235  --    Basic Metabolic Panel: Recent Labs  Lab 07/29/20 1204 07/29/20 1248  NA 139 141  K 4.5 4.5  CL 107 108  CO2 21*  --   GLUCOSE 97 93  BUN 20 29*  CREATININE 1.77* 1.80*  CALCIUM 8.9  --    GFR: CrCl cannot be calculated (Unknown ideal weight.). Liver Function Tests: Recent Labs  Lab 07/29/20 1204  AST 34  ALT 8  ALKPHOS 77  BILITOT 0.7  PROT 7.6  ALBUMIN 3.5   Recent Labs  Lab 07/29/20 1204  LIPASE 36   No results for input(s): AMMONIA in the last 168 hours. Coagulation Profile: No results for input(s): INR, PROTIME in the last 168 hours. Cardiac Enzymes: No results for input(s): CKTOTAL, CKMB, CKMBINDEX, TROPONINI in the last 168 hours. BNP (last 3 results) No results for input(s): PROBNP in the last 8760 hours. HbA1C: No results for input(s): HGBA1C in the last 72 hours. CBG: Recent Labs  Lab 07/29/20 1300  GLUCAP 82   Lipid Profile: No results for input(s): CHOL, HDL, LDLCALC, TRIG, CHOLHDL, LDLDIRECT in the last 72 hours. Thyroid Function Tests: No results for input(s): TSH, T4TOTAL, FREET4, T3FREE, THYROIDAB in the last 72 hours. Anemia Panel: No results for input(s): VITAMINB12, FOLATE, FERRITIN, TIBC, IRON, RETICCTPCT in the last 72 hours. Urine analysis:    Component Value Date/Time   COLORURINE STRAW (A) 07/29/2020 1154   APPEARANCEUR CLEAR 07/29/2020 1154   LABSPEC 1.016 07/29/2020 1154   PHURINE 7.0 07/29/2020 1154   GLUCOSEU NEGATIVE  07/29/2020 1154   HGBUR NEGATIVE 07/29/2020 1154   BILIRUBINUR NEGATIVE 07/29/2020 1154   [place] 07/29/2020 1154   PROTEINUR 100 (A) 07/29/2020 1154   UROBILINOGEN 0.2 12/06/2013 0339   NITRITE NEGATIVE 07/29/2020 1154   LEUKOCYTESUR NEGATIVE 07/29/2020 1154    Radiological Exams on Admission: DG Chest 2 View  Result Date: 07/29/2020 CLINICAL DATA:  Chest pain and hypertension EXAM: CHEST - 2 VIEW COMPARISON:  February 09, 2015 FINDINGS: Lungs are clear. Heart is mildly enlarged with pulmonary vascularity normal. No adenopathy. There is aortic atherosclerosis. No bone lesions. IMPRESSION: Mild cardiomegaly. Lungs clear. Aortic Atherosclerosis (ICD10-I70.0). Electronically Signed   By: Lowella Grip III M.D.   On: 07/29/2020 12:56   CT Head Wo Contrast  Result Date: 07/29/2020 CLINICAL DATA:  Left facial numbness. EXAM: CT HEAD WITHOUT CONTRAST TECHNIQUE: Contiguous axial images were obtained from the base of the skull through the vertex without intravenous contrast. COMPARISON:  None. FINDINGS: Brain: Mild diffuse cortical atrophy is noted. Mild chronic ischemic white matter disease is noted. No mass effect or midline shift is noted. Ventricular size is within normal limits. There is no evidence of mass lesion, hemorrhage or acute infarction. Vascular: No hyperdense vessel or unexpected calcification. Skull: Normal. Negative for fracture or focal lesion. Sinuses/Orbits: No acute finding. Other: None. IMPRESSION: Mild diffuse cortical atrophy. Mild chronic ischemic white matter disease. No acute intracranial abnormality seen. Electronically Signed   By: Marijo Conception M.D.   On: 07/29/2020 14:51   CT Angio Chest/Abd/Pel for Dissection W and/or Wo Contrast  Result Date: 07/29/2020 CLINICAL DATA:  Abdominal pain. Evaluate for abdominal aortic dissection. EXAM: CT ANGIOGRAPHY CHEST, ABDOMEN AND PELVIS TECHNIQUE: Non-contrast CT of the chest was initially obtained. Multidetector CT  imaging through the chest, abdomen and pelvis was performed using the standard protocol during bolus administration of intravenous contrast. Multiplanar reconstructed images and MIPs were obtained and reviewed to evaluate the vascular anatomy. CONTRAST:  185mL OMNIPAQUE IOHEXOL 350 MG/ML SOLN COMPARISON:  CT chest 08/07/2011 FINDINGS: CTA CHEST FINDINGS Cardiovascular: Preferential opacification of the thoracic aorta. No evidence of thoracic aortic aneurysm. Mild dilatation of the transverse aortic arch measures 3.2 cm. Aberrant right subclavian artery. Cardiac enlargement. No pericardial effusion. Mediastinum/Nodes: No enlarged mediastinal, hilar, or axillary lymph nodes. Thyroid gland, trachea, and esophagus demonstrate no significant findings. Similar appearance of fluid attenuating structure within the posterior mediastinum at the level of the hiatus. This measures 2.7 x 2.5 cm, image 81/7. Previously this measured 2.5 x 2.2 cm. Lungs/Pleura: No pleural effusion. No airspace consolidation. There is a part solid nodule within the superior segment of left lower lobe measuring 2.2 by 1.6 cm with central solid component measuring 6 mm, image 34/9. Previously this was entirely non solid measuring 1.8 x 1.4 cm. Musculoskeletal: No acute or suspicious osseous abnormality. Spondylosis noted within the thoracic spine. Review of the MIP images confirms the above findings. CTA ABDOMEN AND PELVIS FINDINGS VASCULAR Aorta: Normal caliber aorta without aneurysm, dissection, vasculitis or significant stenosis. Aortic atherosclerosis. Celiac: Patent without evidence of aneurysm, dissection, vasculitis or significant stenosis. SMA: Patent without evidence of aneurysm, dissection, vasculitis or significant stenosis. Renals: Both renal arteries are patent without evidence of aneurysm, dissection, vasculitis, fibromuscular dysplasia or significant stenosis. IMA: Patent without evidence of aneurysm, dissection, vasculitis or  significant stenosis. Inflow: Patent without evidence of aneurysm, dissection, vasculitis or significant stenosis. Veins: No obvious venous abnormality within the limitations of this arterial phase study. Review of the MIP images confirms the above findings. NON-VASCULAR Hepatobiliary: Several liver cysts are again noted and appears similar to previous exam. No suspicious liver abnormality. Multiple stones identified within the gallbladder which measures up to 9 mm. No gallbladder wall thickening or pericholecystic fluid. No bile duct dilatation identified. Pancreas: Unremarkable. No pancreatic ductal dilatation or surrounding inflammatory changes. Spleen: Normal in size without focal abnormality. Adrenals/Urinary Tract: Left kidney cysts are noted. The largest arises from the upper pole measuring 3.2 cm. Additionally there are several, less than 1 cm, too small to reliably characterize renal hypodensities. There is a subtle, slightly exophytic soft tissue attenuating lesion arising off the medial cortex of the upper pole of left kidney. This measures 1.2 cm with mean Hounsfield units of 87, image 87/12 and  image 101/7. No hydronephrosis identified bilaterally. Urinary bladder is unremarkable. Stomach/Bowel: The stomach is nondistended. The appendix is not confidently identified separate from the right lower quadrant bowel loops. No bowel wall thickening, inflammation, or distension. Lymphatic: There is mild hazy soft tissue within the porta hepatic region surrounding the replaced right hepatic artery measuring approximately 2.2 x 1.9 cm, image 103/7. No retroperitoneal, mesenteric, or pelvic adenopathy. Reproductive: Prostate gland is enlarged and has mass effect upon the base of bladder. Other: Left inguinal hernia contains nonobstructed loop of large bowel. No free fluid or fluid collections identified. 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 Well-defined, nonaggressive  appearing sclerotic lesion within L5 is favored to represent a large bone island. This measures approximately 1.5 cm. No acute or suspicious osseous findings. Review of the MIP images confirms the above findings. IMPRESSION: 1. No evidence for aortic dissection or aneurysm. 2. There is mild hazy soft tissue within the porta hepatic region which encases the replaced right hepatic artery. Etiology is indeterminate. Differential considerations include confluent inflammation or infiltrative neoplasm. In the absence of signs or symptoms of inflammation/infection further investigation with abdominal MRI without and with contrast material is recommended. 3. There is a subtle, slightly exophytic soft tissue attenuating lesion arising off the medial cortex of the upper pole of left kidney. This is indeterminate and may represent a hemorrhagic/proteinaceous cyst versus solid enhancing neoplasm. Further evaluation with contrast enhanced abdominal MRI is recommended. 4. There is a part solid nodule within the superior segment of left lower lobe measuring 2.2 cm with central solid component measuring 6 mm. Previously this was entirely non solid measuring 1.8 x 1.4 cm. This is considered highly suspicious for low-grade indolent pulmonary neoplasm such as adenocarcinoma. This recommendation follows the consensus statement: Guidelines for Management of Incidental Pulmonary Nodules Detected on CT Images: From the Fleischner Society 2017; Radiology 2017; 284:228-243. 5. Aberrant right subclavian artery. 6. Gallstones. 7. Left inguinal hernia contains nonobstructed loop of large bowel. 8. Enlarged prostate gland with mass effect upon the base of bladder. 9. 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 10. Aortic atherosclerosis. Aortic Atherosclerosis (ICD10-I70.0). Electronically Signed   By: Kerby Moors M.D.   On: 07/29/2020 15:17    EKG: Independently reviewed. [facility]  Assessment/Plan Active Problems:   * No active  hospital problems. *  (please populate well all problems here in Problem List. (For example, if patient is on BP meds at home and you resume or decide to hold them, it is a problem that needs to be her. Same for CAD, COPD, HLD and so on)  HTN emergency -2/2 non-compliance -Start Amlodipine, and Coreg  Chest pain -Atypical, mild elevation of troponin level, pattern is flat, unlikely ACS. -Probably related to HTN emergency -Check Echo  Anemia -Check Iron study -Denies any melena hematochezia. -Repeat H/H in AM, if stable consider outpt GI consult. -Start PPI  Abnormal finding on liver CT -aFP -Outpatient MRI  CKD stage III -Cre stable and euvolumic -Encourage fluid intake for kidney protection since patient had IV contrast today. Patient understood.  Left kidney lesion -Outpatient MRI  DVT prophylaxis: SCD Code Status: FullCode Family Communication: Daughter at bedside Disposition Plan: Expect less than 2 midnight hospital stay Consults called: None Admission status: Tele obs   Lequita Halt MD [facility] Pager [phone]  07/29/2020, 5:32 PM

## 2020-07-29 NOTE — ED Notes (Signed)
Daughter at bedside.

## 2020-07-29 NOTE — ED Triage Notes (Addendum)
Pt brought to ED via EMS from [facility] for BP of 240/100, left sided numbness x 1 month. Patient presented to [facility] today for bursitis of left hip, EMS called for BP. Patient has not taken BP meds x 1 year due to wife moving to SNF, need for new PCP. Alert on arrival to ED, NAD.  EMS v/s: 18 RR 100% on room air

## 2020-07-30 ENCOUNTER — Observation Stay (HOSPITAL_COMMUNITY): Payer: Medicare Other

## 2020-07-30 ENCOUNTER — Encounter (HOSPITAL_COMMUNITY): Payer: Self-pay | Admitting: Internal Medicine

## 2020-07-30 ENCOUNTER — Observation Stay (HOSPITAL_BASED_OUTPATIENT_CLINIC_OR_DEPARTMENT_OTHER): Payer: Medicare Other

## 2020-07-30 ENCOUNTER — Inpatient Hospital Stay (HOSPITAL_COMMUNITY): Payer: Medicare Other

## 2020-07-30 DIAGNOSIS — Z20822 Contact with and (suspected) exposure to covid-19: Secondary | ICD-10-CM | POA: Diagnosis present

## 2020-07-30 DIAGNOSIS — R911 Solitary pulmonary nodule: Secondary | ICD-10-CM | POA: Diagnosis present

## 2020-07-30 DIAGNOSIS — I160 Hypertensive urgency: Secondary | ICD-10-CM

## 2020-07-30 DIAGNOSIS — I2510 Atherosclerotic heart disease of native coronary artery without angina pectoris: Secondary | ICD-10-CM | POA: Diagnosis present

## 2020-07-30 DIAGNOSIS — K219 Gastro-esophageal reflux disease without esophagitis: Secondary | ICD-10-CM | POA: Diagnosis present

## 2020-07-30 DIAGNOSIS — M109 Gout, unspecified: Secondary | ICD-10-CM | POA: Diagnosis present

## 2020-07-30 DIAGNOSIS — K6289 Other specified diseases of anus and rectum: Secondary | ICD-10-CM | POA: Diagnosis not present

## 2020-07-30 DIAGNOSIS — N179 Acute kidney failure, unspecified: Secondary | ICD-10-CM | POA: Diagnosis present

## 2020-07-30 DIAGNOSIS — K921 Melena: Secondary | ICD-10-CM | POA: Diagnosis present

## 2020-07-30 DIAGNOSIS — R079 Chest pain, unspecified: Secondary | ICD-10-CM | POA: Diagnosis not present

## 2020-07-30 DIAGNOSIS — R0789 Other chest pain: Secondary | ICD-10-CM | POA: Diagnosis present

## 2020-07-30 DIAGNOSIS — R200 Anesthesia of skin: Secondary | ICD-10-CM | POA: Diagnosis present

## 2020-07-30 DIAGNOSIS — D509 Iron deficiency anemia, unspecified: Secondary | ICD-10-CM

## 2020-07-30 DIAGNOSIS — F419 Anxiety disorder, unspecified: Secondary | ICD-10-CM | POA: Diagnosis present

## 2020-07-30 DIAGNOSIS — I161 Hypertensive emergency: Secondary | ICD-10-CM | POA: Diagnosis present

## 2020-07-30 DIAGNOSIS — Z9114 Patient's other noncompliance with medication regimen: Secondary | ICD-10-CM | POA: Diagnosis not present

## 2020-07-30 DIAGNOSIS — I129 Hypertensive chronic kidney disease with stage 1 through stage 4 chronic kidney disease, or unspecified chronic kidney disease: Secondary | ICD-10-CM | POA: Diagnosis present

## 2020-07-30 DIAGNOSIS — I739 Peripheral vascular disease, unspecified: Secondary | ICD-10-CM | POA: Diagnosis present

## 2020-07-30 DIAGNOSIS — C20 Malignant neoplasm of rectum: Secondary | ICD-10-CM | POA: Diagnosis present

## 2020-07-30 DIAGNOSIS — N400 Enlarged prostate without lower urinary tract symptoms: Secondary | ICD-10-CM | POA: Diagnosis present

## 2020-07-30 DIAGNOSIS — F17210 Nicotine dependence, cigarettes, uncomplicated: Secondary | ICD-10-CM | POA: Diagnosis present

## 2020-07-30 DIAGNOSIS — N1832 Chronic kidney disease, stage 3b: Secondary | ICD-10-CM | POA: Diagnosis present

## 2020-07-30 DIAGNOSIS — D490 Neoplasm of unspecified behavior of digestive system: Secondary | ICD-10-CM | POA: Diagnosis not present

## 2020-07-30 DIAGNOSIS — R932 Abnormal findings on diagnostic imaging of liver and biliary tract: Secondary | ICD-10-CM | POA: Diagnosis not present

## 2020-07-30 DIAGNOSIS — I10 Essential (primary) hypertension: Secondary | ICD-10-CM | POA: Diagnosis not present

## 2020-07-30 DIAGNOSIS — R0781 Pleurodynia: Secondary | ICD-10-CM | POA: Diagnosis present

## 2020-07-30 DIAGNOSIS — I639 Cerebral infarction, unspecified: Secondary | ICD-10-CM | POA: Diagnosis not present

## 2020-07-30 DIAGNOSIS — R778 Other specified abnormalities of plasma proteins: Secondary | ICD-10-CM | POA: Diagnosis present

## 2020-07-30 DIAGNOSIS — I6381 Other cerebral infarction due to occlusion or stenosis of small artery: Secondary | ICD-10-CM | POA: Diagnosis present

## 2020-07-30 DIAGNOSIS — E785 Hyperlipidemia, unspecified: Secondary | ICD-10-CM | POA: Diagnosis present

## 2020-07-30 LAB — CBC
HCT: 25.5 % — ABNORMAL LOW (ref 39.0–52.0)
Hemoglobin: 7.8 g/dL — ABNORMAL LOW (ref 13.0–17.0)
MCH: 22 pg — ABNORMAL LOW (ref 26.0–34.0)
MCHC: 30.6 g/dL (ref 30.0–36.0)
MCV: 72 fL — ABNORMAL LOW (ref 80.0–100.0)
Platelets: 248 10*3/uL (ref 150–400)
RBC: 3.54 MIL/uL — ABNORMAL LOW (ref 4.22–5.81)
RDW: 17.2 % — ABNORMAL HIGH (ref 11.5–15.5)
WBC: 6.3 10*3/uL (ref 4.0–10.5)
nRBC: 0 % (ref 0.0–0.2)

## 2020-07-30 LAB — FERRITIN: Ferritin: 7 ng/mL — ABNORMAL LOW (ref 24–336)

## 2020-07-30 LAB — IRON AND TIBC
Iron: 26 ug/dL — ABNORMAL LOW (ref 45–182)
Saturation Ratios: 6 % — ABNORMAL LOW (ref 17.9–39.5)
TIBC: 417 ug/dL (ref 250–450)
UIBC: 391 ug/dL

## 2020-07-30 LAB — BASIC METABOLIC PANEL
Anion gap: 9 (ref 5–15)
BUN: 21 mg/dL (ref 8–23)
CO2: 24 mmol/L (ref 22–32)
Calcium: 9 mg/dL (ref 8.9–10.3)
Chloride: 105 mmol/L (ref 98–111)
Creatinine, Ser: 1.78 mg/dL — ABNORMAL HIGH (ref 0.61–1.24)
GFR, Estimated: 36 mL/min — ABNORMAL LOW (ref >60)
Glucose, Bld: 104 mg/dL — ABNORMAL HIGH (ref 70–99)
Potassium: 3.9 mmol/L (ref 3.5–5.1)
Sodium: 138 mmol/L (ref 135–145)

## 2020-07-30 LAB — HEMOGLOBIN AND HEMATOCRIT, BLOOD
HCT: 29.8 % — ABNORMAL LOW (ref 39.0–52.0)
HCT: 25.3 % — ABNORMAL LOW (ref 39.0–52.0)
Hemoglobin: 8.7 g/dL — ABNORMAL LOW (ref 13.0–17.0)
Hemoglobin: 7.5 g/dL — ABNORMAL LOW (ref 13.0–17.0)

## 2020-07-30 LAB — MRSA PCR SCREENING: MRSA by PCR: NEGATIVE

## 2020-07-30 LAB — ECHOCARDIOGRAM COMPLETE
Area-P 1/2: 2.87 cm2
Height: 71 in
S' Lateral: 3 cm
Weight: 2716.07 oz

## 2020-07-30 IMAGING — CR DG RIBS 2V*L*
4 series · 4 of 4 positions shown · non-contrast
Comparison: Chest x-ray [DATE] and [DATE]

CLINICAL DATA: Anterior rib pain.

EXAM:
LEFT RIBS - 2 VIEW

[rib pa (1 of 2)]
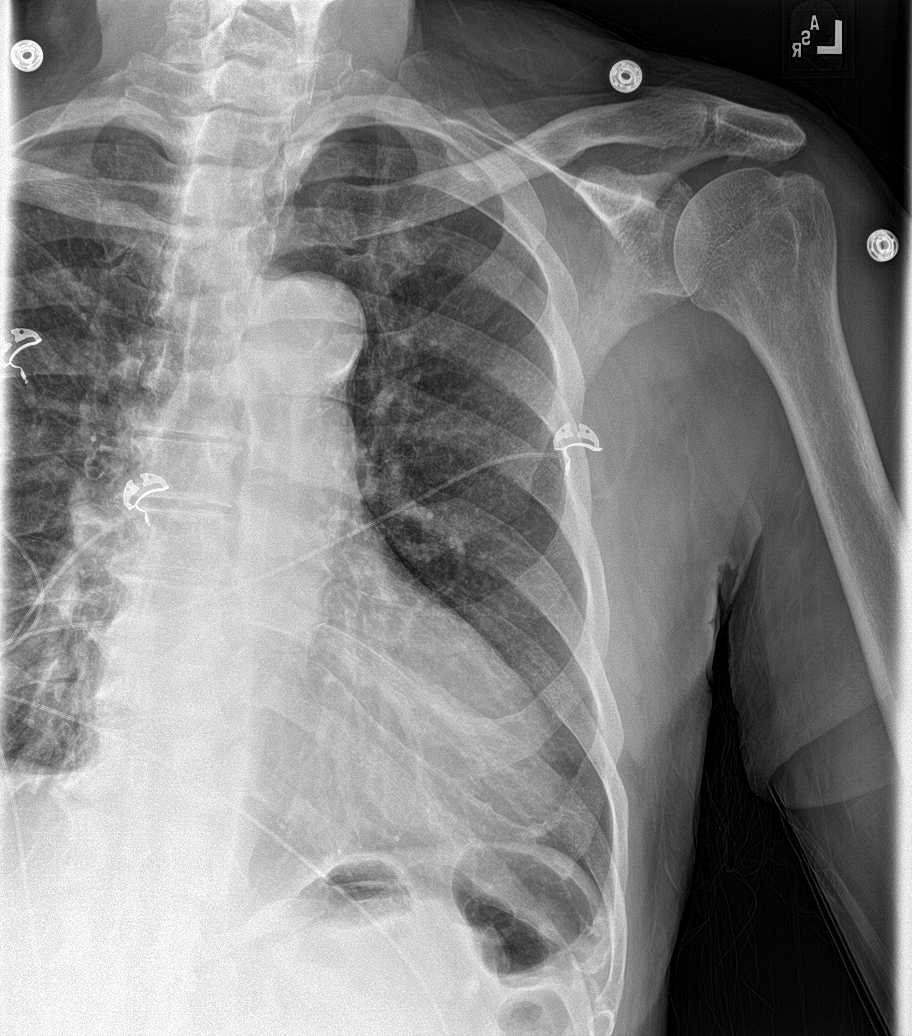

[rib pa obl (1 of 2)]
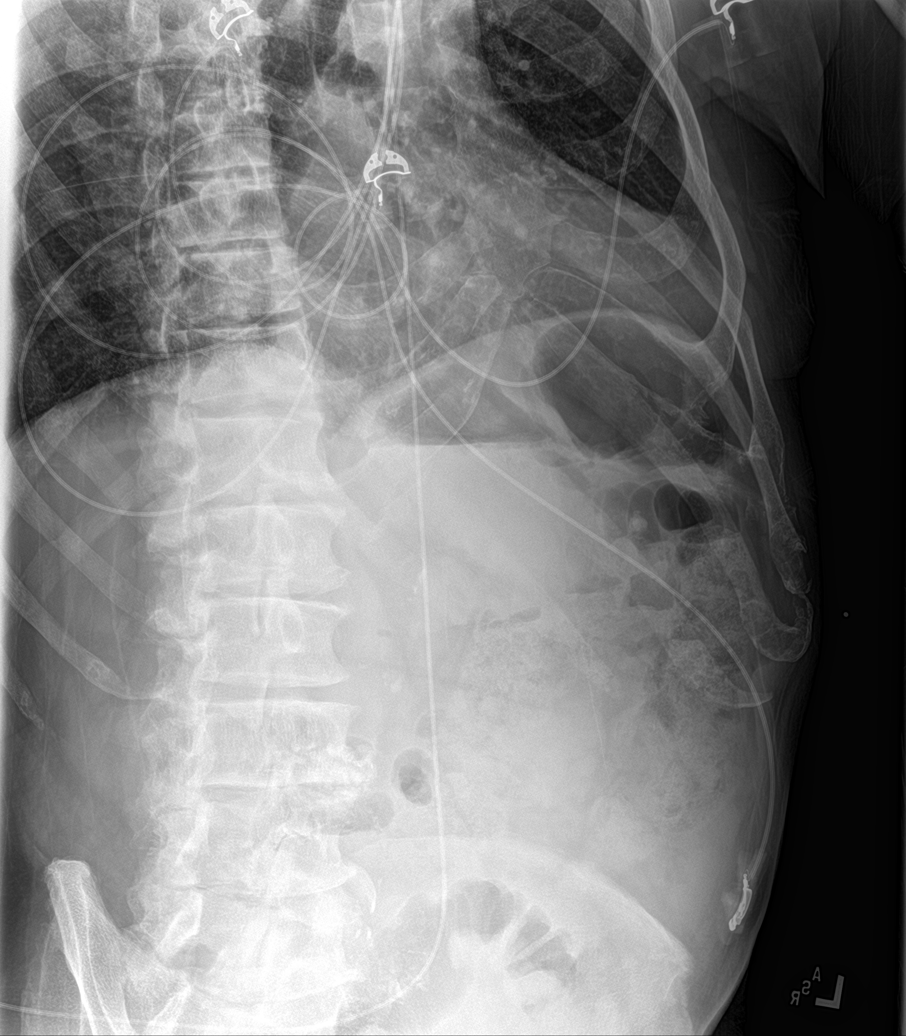

[rib pa obl (2 of 2)]
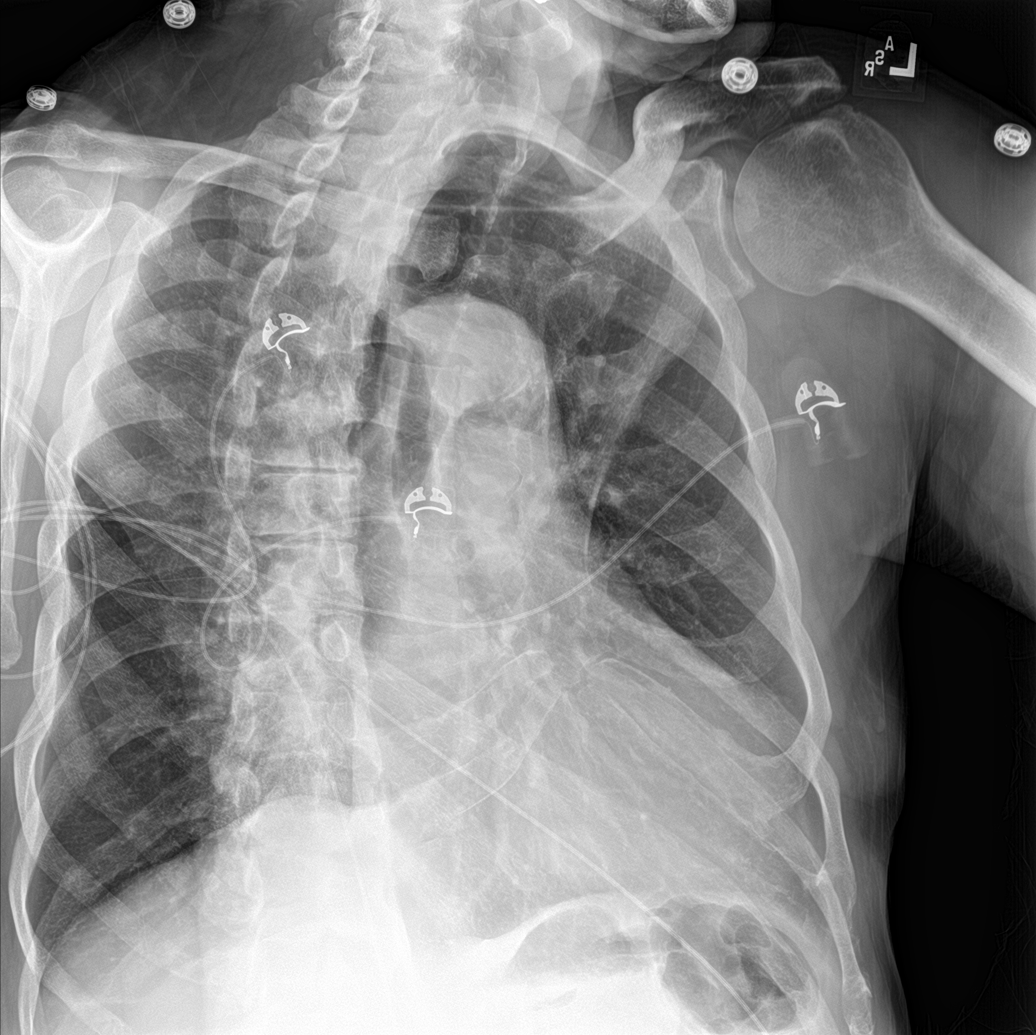

[rib pa (2 of 2)]
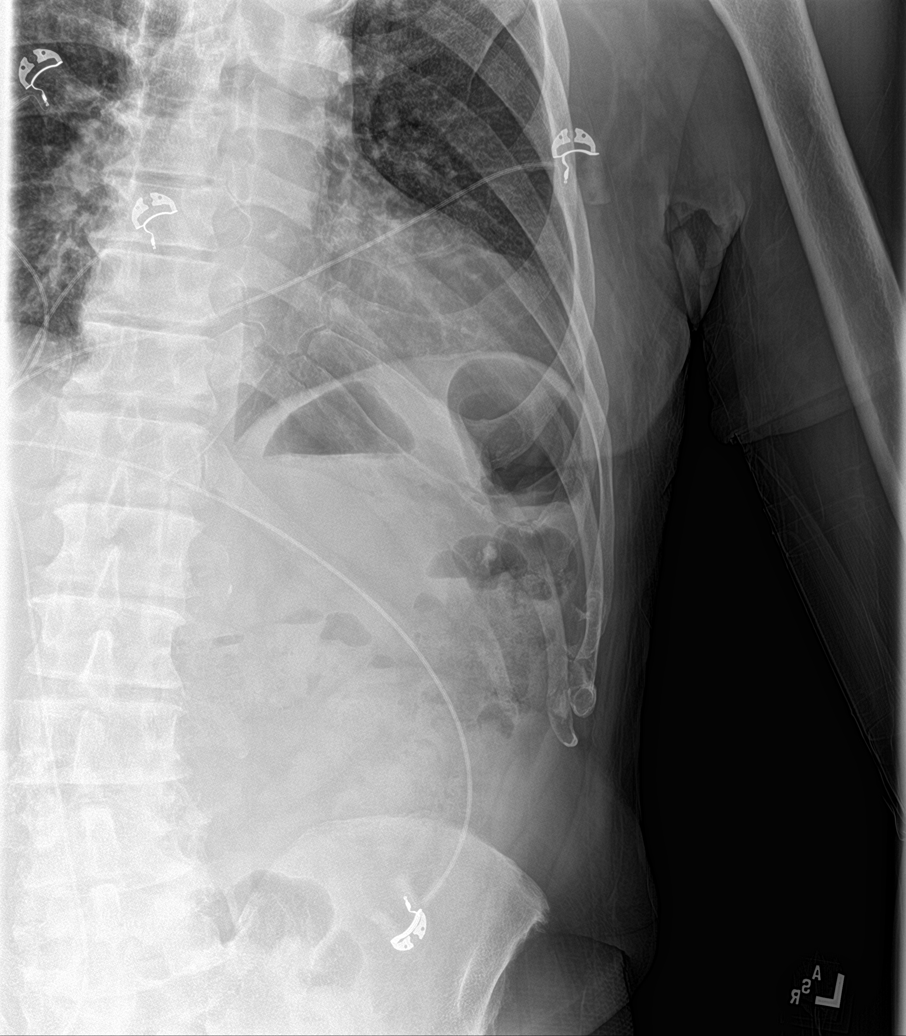

[4 of 4 positions shown; findings below may reference images not displayed]

FINDINGS: No fracture or other bone lesions are seen involving the ribs.
IMPRESSION: Negative.

## 2020-07-30 IMAGING — MR MR ABDOMEN WO/W CM
18 series · 48 of 48 positions shown · IV contrast (Gadavist)
Comparison: Radiograph [DATE]. Abdominal CTA [DATE] and
chest CT [DATE].

CLINICAL DATA: Indeterminate soft tissue stranding in the porta
hepatis on prior CT. Question neoplasm. Indeterminate left renal
lesion.

EXAM:
MRI ABDOMEN WITHOUT AND WITH CONTRAST
TECHNIQUE: Multiplanar multisequence MR imaging of the abdomen was performed
both before and after the administration of intravenous contrast.
CONTRAST:  7.5mL GADAVIST GADOBUTROL 1 MMOL/ML IV SOLN

[Series 4: cor haste · coronal · 6.0mm · 1.19mm/px · 3 of 35 slices shown]
[im 1/35]
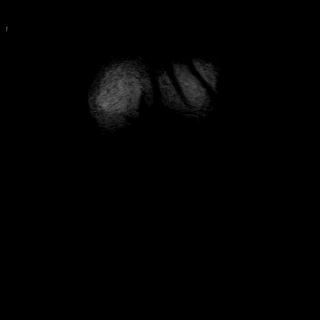
[im 18/35]
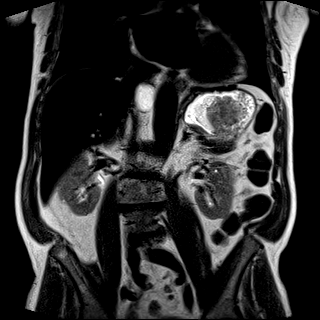
[im 35/35]
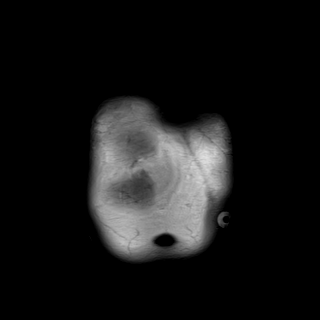

[Series 5: ax haste · axial · 6.0mm · 1.19mm/px · z∈[-142,+67]mm · 2 of 30 slices shown]
[im 1/30]
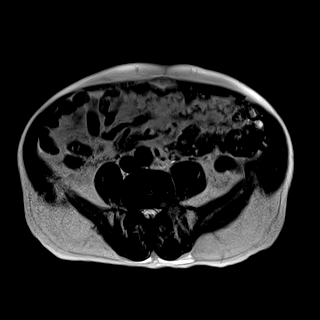
[im 30/30]
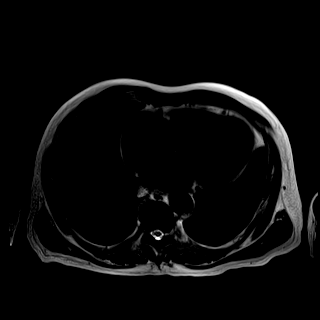

[Series 6: t1_vibe_opp-in_tra_p4_bh · axial · 3.0mm · 1.19mm/px · z∈[-163,+50]mm · 3 of 72 slices shown (1 of 2)]
[im 1/72]
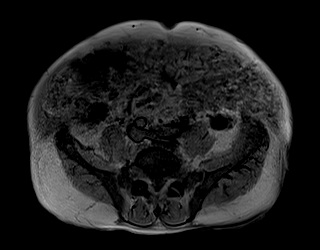
[im 36/72]
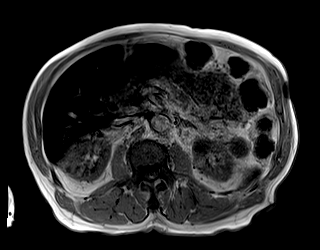
[im 72/72]
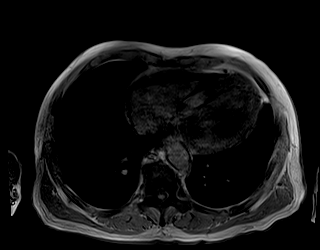

[Series 6: t1_vibe_opp-in_tra_p4_bh · axial · 3.0mm · 1.19mm/px · z∈[-163,+50]mm · 3 of 72 slices shown (2 of 2)]
[im 1/72]
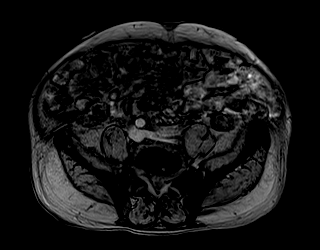
[im 36/72]
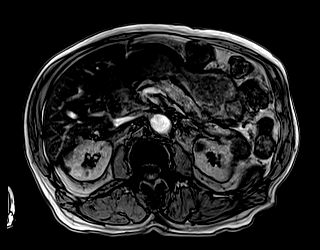
[im 72/72]
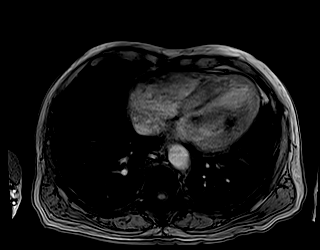

[Series 7: bSSFP · axial · 6.0mm · 0.74mm/px · 1 of 30 slices shown]
[im 1/30]
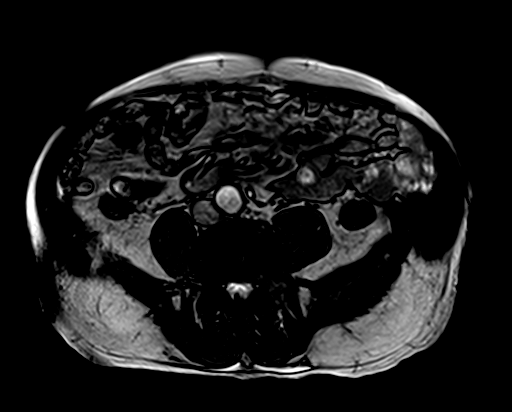

[Series 8: t1_vibe_fs_tra_p4_bh_pre · axial · 3.0mm · 1.19mm/px · z∈[-163,+50]mm · 3 of 72 slices shown]
[im 1/72]
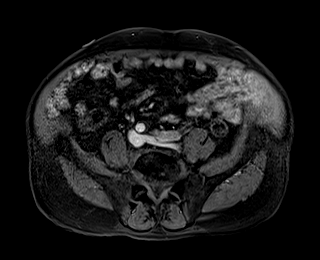
[im 36/72]
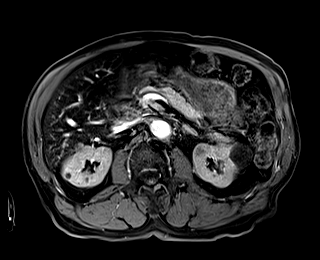
[im 72/72]
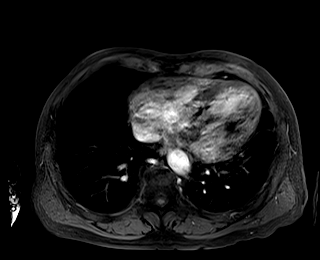

[Series 11: T2 fat-sat · axial · 6.0mm · 1.19mm/px · 1 of 30 slices shown]
[im 1/30]
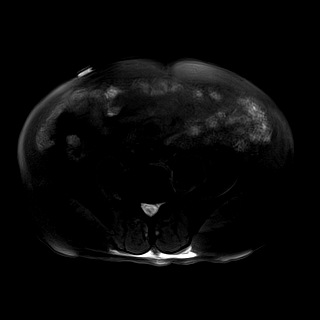

[Series 12: DWI · axial · 6.0mm · 1.42mm/px · z∈[-139,+70]mm · 4 of 90 slices shown (1 of 2)]
[im 1/90]
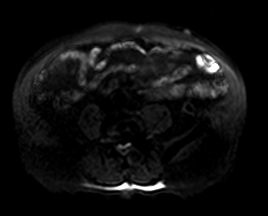
[im 30/90]
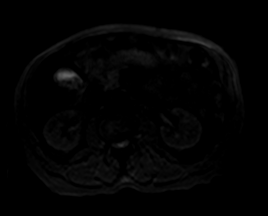
[im 60/90]
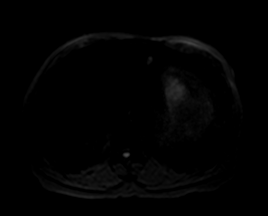
[im 90/90]
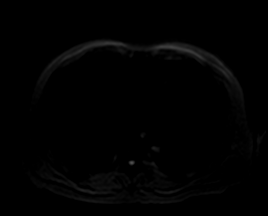

[Series 13: DWI · axial · 6.0mm · 1.42mm/px · 1 of 30 slices shown (2 of 2)]
[im 1/30]
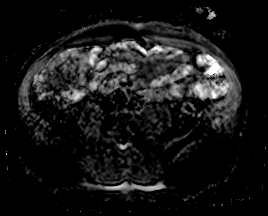

[Series 14: t1_vibe_fs_tra_p4_bh_post · axial · 3.0mm · 1.19mm/px · z∈[-163,+50]mm · 3 of 72 slices shown (1 of 4)]
[im 1/72]
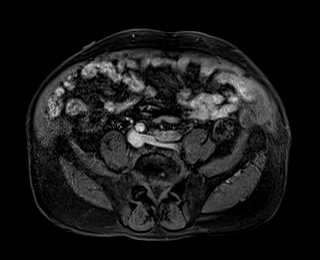
[im 36/72]
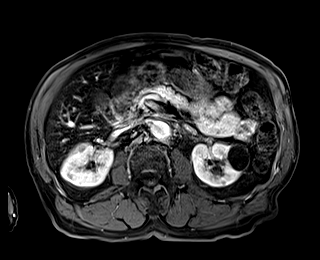
[im 72/72]
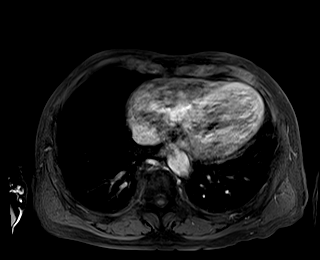

[Series 15: t1_vibe_fs_tra_p4_bh_post_sub · axial · 3.0mm · 1.19mm/px · z∈[-163,+50]mm · 3 of 72 slices shown (1 of 4)]
[im 1/72]
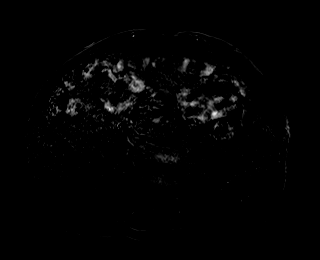
[im 36/72]
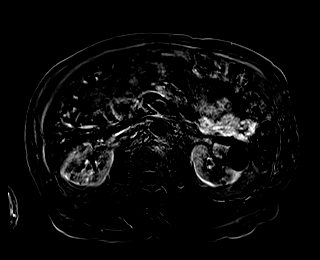
[im 72/72]
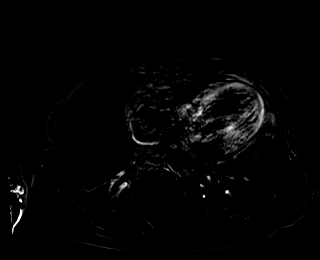

[Series 16: t1_vibe_fs_tra_p4_bh_post · axial · 3.0mm · 1.19mm/px · z∈[-163,+50]mm · 3 of 72 slices shown (2 of 4)]
[im 1/72]
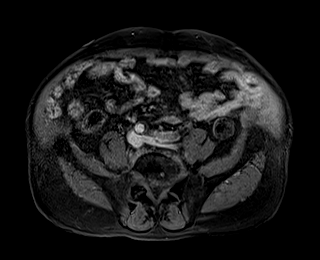
[im 36/72]
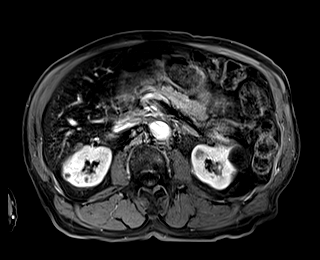
[im 72/72]
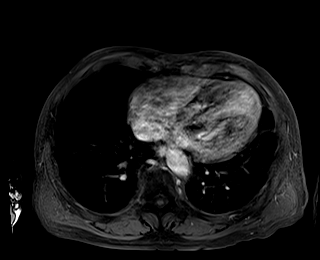

[Series 17: t1_vibe_fs_tra_p4_bh_post_sub · axial · 3.0mm · 1.19mm/px · z∈[-163,+50]mm · 3 of 72 slices shown (2 of 4)]
[im 1/72]
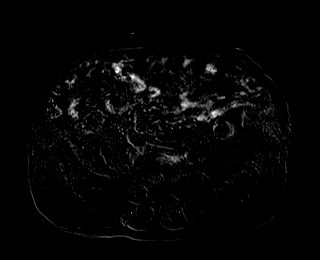
[im 36/72]
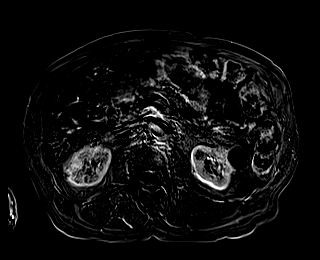
[im 72/72]
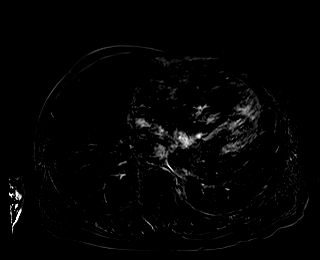

[Series 18: t1_vibe_fs_tra_p4_bh_post · axial · 3.0mm · 1.19mm/px · z∈[-163,+50]mm · 3 of 72 slices shown (3 of 4)]
[im 1/72]
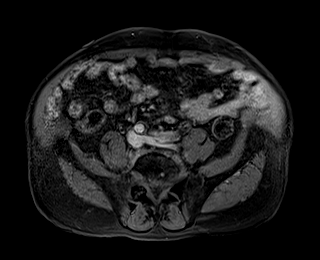
[im 36/72]
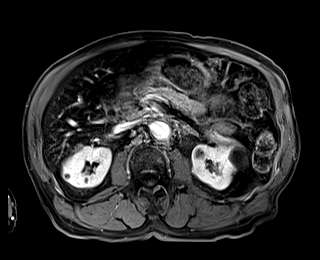
[im 72/72]
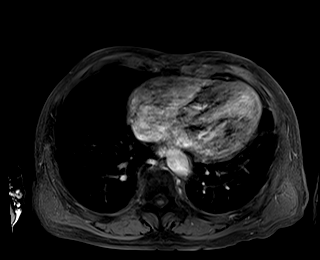

[Series 19: t1_vibe_fs_tra_p4_bh_post_sub · axial · 3.0mm · 1.19mm/px · z∈[-163,+50]mm · 3 of 71 slices shown (3 of 4)]
[im 1/71]
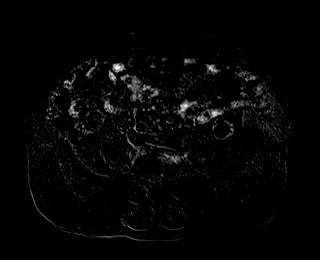
[im 36/71]
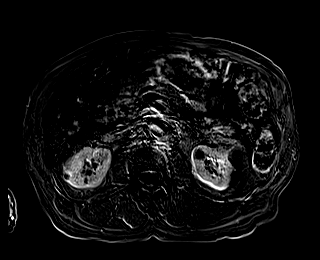
[im 71/71]
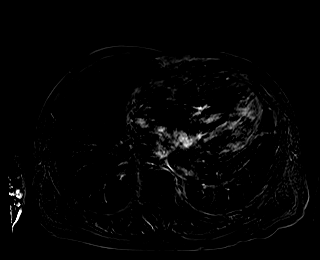

[Series 20: t1_vibe_fs_tra_p4_bh_post · axial · 3.0mm · 1.19mm/px · z∈[-163,+50]mm · 3 of 72 slices shown (4 of 4)]
[im 1/72]
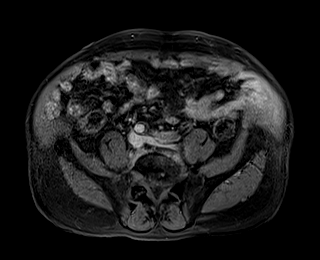
[im 36/72]
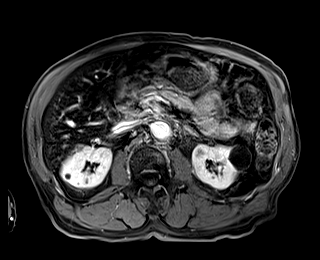
[im 72/72]
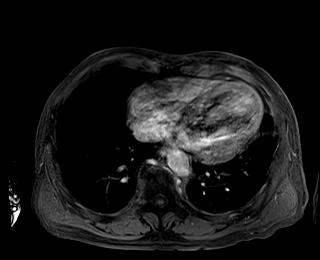

[Series 21: t1_vibe_fs_tra_p4_bh_post_sub · axial · 3.0mm · 1.19mm/px · z∈[-163,+50]mm · 3 of 72 slices shown (4 of 4)]
[im 1/72]
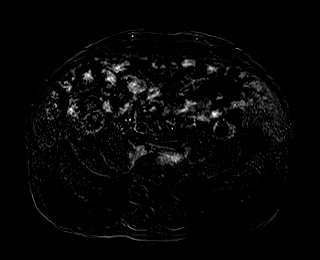
[im 36/72]
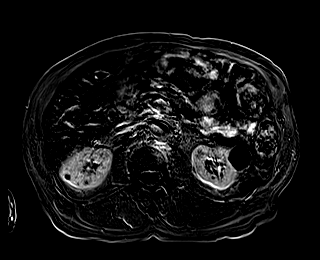
[im 72/72]
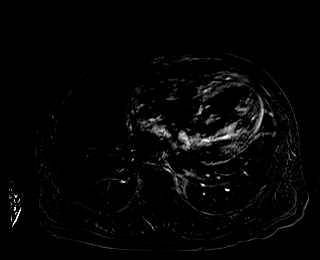

[Series 23: T1 dynamic post-contrast · coronal · 3.0mm · 1.31mm/px · 3 of 72 slices shown]
[im 1/72]
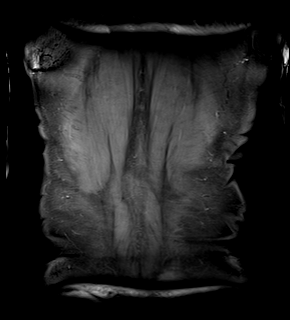
[im 36/72]
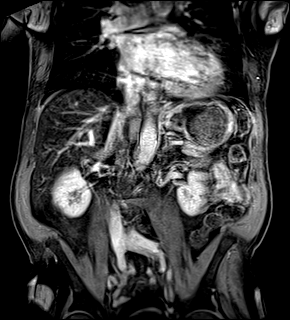
[im 72/72]
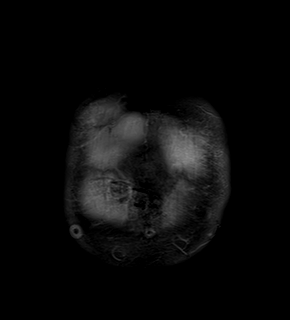

[48 of 48 positions shown; findings below may reference images not displayed]

FINDINGS: Despite efforts by the technologist and patient, mild motion
artifact is present on today's exam and could not be eliminated.
This reduces exam sensitivity and specificity.

Lower chest: Right retrocrural cystic lesion measuring up to 4.1 cm
on image [DATE] is stable from the remote CT, consistent with a benign
finding. Probable left ventricular hypertrophy. The visualized lower
chest otherwise appears unremarkable.

Hepatobiliary: There is diffusely decreased signal throughout the
liver on T2 weighted images, and loss of signal on the in phase
images consistent with hemosiderosis. There are scattered hepatic
cysts. No suspicious liver lesions. Multiple gallstones are again
noted. No evidence of gallbladder wall thickening or biliary
dilatation.

Pancreas: Unremarkable. No pancreatic ductal dilatation or
surrounding inflammatory changes.

Spleen: Diffusely decreased T2 signal consistent with hemosiderosis.
No focal abnormality or splenomegaly.

Adrenals/Urinary Tract: Both adrenal glands appear normal. Bilateral
renal cysts are noted, largest in the upper pole of the left kidney,
measuring 3.2 cm in diameter. The indeterminate lesion medially in
the upper pole of the left kidney is suboptimally evaluated by this
examination due to motion artifact. However, there is a
hypoenhancing lesion in this area which measures 1.1 cm, best seen
on image 35/20. In addition, there is an 8 mm lesion anteriorly in
the interpolar region of the right kidney (image 38/20) which also
demonstrates low-level enhancement and low T2 signal. No
hydronephrosis.

Stomach/Bowel: Moderate ingested material in the stomach. No
evidence bowel wall thickening, distention or surrounding
inflammation.

Vascular/Lymphatic: Aortic and branch vessel atherosclerosis. No
evidence of aneurysm or large vessel occlusion. The portal, superior
mesenteric and splenic veins appear patent. No enlarged abdominal
lymph nodes are seen.

Other: No mass, inflammation or abnormal enhancement is seen within
the porta hepatis. Evaluation is somewhat limited by the breathing
artifact, although no significant abnormality is suspected.

Musculoskeletal: No acute or significant osseous findings.
Multilevel spondylosis. Pagetoid changes in the L3 vertebral body
and posterior elements as correlated with recent CT.
IMPRESSION: 1. No evidence of mass in the porta hepatis. The pancreas appears
normal, and there is no biliary or pancreatic ductal dilatation.
2. Small hypoenhancing lesions medially in the upper pole of the
left kidney and anteriorly in the interpolar region of the right
kidney, suspicious for small neoplasms. These lesions are
incompletely evaluated by this examination due to motion artifact.
Clinical significance is doubtful given the patient's age, and
dedicated renal MR or CT (pre and post contrast) follow-up in 6-12
months recommended.
3. Cholelithiasis without evidence of biliary dilatation.
4. Hemosiderosis.

## 2020-07-30 MED ORDER — PANTOPRAZOLE SODIUM 40 MG PO TBEC
40.0000 mg | DELAYED_RELEASE_TABLET | Freq: Two times a day (BID) | ORAL | Status: DC
  Administered 2020-07-30 (×2): 40 mg via ORAL
  Filled 2020-07-30 (×2): qty 1

## 2020-07-30 MED ORDER — HYDRALAZINE HCL 20 MG/ML IJ SOLN
5.0000 mg | Freq: Four times a day (QID) | INTRAMUSCULAR | Status: DC | PRN
  Administered 2020-08-03 (×2): 5 mg via INTRAVENOUS
  Filled 2020-07-30: qty 1

## 2020-07-30 MED ORDER — PEG-KCL-NACL-NASULF-NA ASC-C 100 G PO SOLR
0.5000 | Freq: Once | ORAL | Status: DC
  Filled 2020-07-30: qty 1

## 2020-07-30 MED ORDER — GADOBUTROL 1 MMOL/ML IV SOLN
7.5000 mL | Freq: Once | INTRAVENOUS | Status: AC | PRN
  Administered 2020-07-30: 7.5 mL via INTRAVENOUS

## 2020-07-30 MED ORDER — HYDRALAZINE HCL 50 MG PO TABS
50.0000 mg | ORAL_TABLET | Freq: Three times a day (TID) | ORAL | Status: DC
  Administered 2020-07-30: 50 mg via ORAL
  Filled 2020-07-30: qty 1

## 2020-07-30 MED ORDER — PEG-KCL-NACL-NASULF-NA ASC-C 100 G PO SOLR: 1.0000 | Freq: Once | ORAL | Status: DC

## 2020-07-30 MED ORDER — SODIUM CHLORIDE 0.9 % IV SOLN
510.0000 mg | Freq: Once | INTRAVENOUS | Status: AC
  Administered 2020-07-30: 510 mg via INTRAVENOUS
  Filled 2020-07-30: qty 17

## 2020-07-30 MED ORDER — PEG-KCL-NACL-NASULF-NA ASC-C 100 G PO SOLR: 0.5000 | Freq: Once | ORAL | Status: DC

## 2020-07-30 MED ORDER — HYDRALAZINE HCL 50 MG PO TABS
75.0000 mg | ORAL_TABLET | Freq: Three times a day (TID) | ORAL | Status: DC
  Administered 2020-07-30 – 2020-07-31 (×3): 75 mg via ORAL
  Filled 2020-07-30 (×3): qty 1

## 2020-07-30 MED ORDER — HYDRALAZINE HCL 20 MG/ML IJ SOLN: 10.0000 mg | Freq: Four times a day (QID) | INTRAMUSCULAR | Status: DC | PRN

## 2020-07-30 MED ORDER — HYDRALAZINE HCL 25 MG PO TABS
25.0000 mg | ORAL_TABLET | Freq: Three times a day (TID) | ORAL | Status: DC
  Administered 2020-07-30: 25 mg via ORAL
  Filled 2020-07-30 (×2): qty 1

## 2020-07-30 NOTE — Progress Notes (Signed)
Pt c/o dizziness & SOB when start to administer Ferumoxytol which patient hasn't taken before. Stopped medication and checked BP 216/84 from 167/85. Talked Pharmacist Lattie Haw about any allergy reaction related to dizziness. Lattie Haw said that it is only 1% and recommanded to restart then if recurring dizziness then stop Ferumoxytol. Patient stated that he felt better now and restarted Ferumoxytol. BP went down to 175/71 now. HS [employer]

## 2020-07-30 NOTE — Plan of Care (Signed)

## 2020-07-30 NOTE — Progress Notes (Signed)
  Echocardiogram 2D Echocardiogram with 3D has been performed.  Darlina Sicilian M 07/30/2020, 9:01 AM

## 2020-07-30 NOTE — Consult Note (Addendum)
Attending physician's note   I have taken an interval history, reviewed the chart and examined the patient. I agree with the Advanced Practitioner's note, impression, and recommendations as outlined.   87 year old male with medical history as outlined below admitted with hypertensive emergency and noted to have iron deficiency anemia. Does have a history of left costal margin pain along with arthritis, and has been taking Advil and Tylenol. States that he had dark stools in January, and prior to that intermittent episodes of blood mixed in the stool, but has not seen this in many months. No previous EGD, but thinks he had a colonoscopy many years ago. Does not take any blood thinners or antiplatelet therapy. Evaluation notable for the following:  -H/H 8.3/27.8 with MCV/RDW 74/17 on admission. 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  Received IV Feraheme today.  1) Iron Deficiency Anemia 2) Dark stools 3) Chronic NSAID use -Eventually needs EGD/colonoscopy, after BP controlled -Continue serial H/H checks with blood products as needed per protocol -IV Feraheme already given today. May need a second dose later on this admission -Agree with continued high-dose PPI -Tentative plan for clears tomorrow with bowel prep tomorrow evening and possible EGD/colonoscopy on Monday, 08/01/2020 with Dr. Carlean Purl, pending hemodynamic stability -I discussed the risks, benefits, alternatives of EGD and colonoscopy with the patient at length today. He wishes to proceed when medically stable  4) Hypertensive emergency -Secondary to medication noncompliance -Continue aggressive BP control per primary Hospitalist service  5) Abnormal  porta hepatis on CT -MRI for further evaluation -Liver enzymes otherwise normal  GI service will continue to follow.  [address] [place], [place] [phone] office            Consultation  Referring Provider: [facility]/ Regalado Primary Care Physician:  Dorothyann Peng, NP Primary Gastroenterologist:  none  Reason for Consultation: Iron deficiency anemia, Hemoccult positive  HPI: GRANITE GODMAN is a 87 y.o. male who was admitted yesterday through the emergency room with some atypical chest pain which on further discussion with patient is actually pain along his left costal margin which he says he has had previously and has been treated as bursitis.  This is been bothering him over the past couple of weeks.  He has history of chronic kidney disease stage III, GERD, prior history of gout.  Also with hypertension and hyperlipidemia.  He says he has not been seen by Dr. In over a year and had run out of blood pressure medication several months ago. He has been having a lot of arthritic complaints and has been taking 2 Advil every day and usually takes 1 or 2 Tylenol per day as needed. He was noted on admission to have hemoglobin of 8.3 hematocrit of 27.8 MCV of 74.5 WBC of 6.1 BUN 29 creatinine 1.8 and normal LFTs.  He has been documented heme positive On labs today hemoglobin 7.8 hematocrit of 25.5.  Ferritin of 7 serum iron 26 TIBC of 417 and iron sat of 6. Troponin mildly elevated at 27 then 36  Hemoglobin was 11 hematocrit of 34 in January 2020  Patient has not had any prior EGD or colonoscopy.  He says in January he did have very dark thick stools for a couple  of weeks off and on, that eventually resolved and he has not been noticing any black or dark stools over the past week or more.  He has no complaints of abdominal pain, appetite has been fine, no nausea or vomiting, no heartburn or indigestion.  CT angio of the chest abdomen and pelvis done yesterday showed some mild haziness  within the porta hepatis region surrounding a replaced right hepatic artery, this measures 2.2 x 1.9 cm and MRI was recommended. 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  Also with multiple gallstones  Patient has received IV Feraheme today He has not been transfused. He has been hypertensive with blood pressure today earlier 216/84.   Past Medical History:  Diagnosis Date  . Anxiety   . BENIGN PROSTATIC HYPERTROPHY 04/01/2008  . Coronary artery disease   . GERD (gastroesophageal reflux disease)   . HYPERLIPIDEMIA 05/26/2007  . HYPERTENSION 02/07/2007  . Syncope and collapse   . TOBACCO ABUSE 04/01/2008    Past Surgical History:  Procedure Laterality Date  . APPENDECTOMY    . TONSILLECTOMY      Prior to Admission medications   Medication Sig Start Date End Date Taking? Authorizing Provider  acetaminophen (TYLENOL 8 HOUR ARTHRITIS PAIN) 650 MG CR tablet Take 650 mg by mouth daily as needed for pain.   Yes [provider]  ibuprofen (ADVIL) 200 MG tablet Take 400 mg by mouth daily as needed for headache (pain).   Yes [provider]  polyvinyl alcohol (ARTIFICIAL TEARS) 1.4 % ophthalmic solution Place 1 drop into both eyes daily as needed for dry eyes.   Yes [provider]  allopurinol (ZYLOPRIM) 100 MG tablet TAKE 2 TABLETS BY MOUTH EVERY DAY Patient not taking: Reported on 07/29/2020 09/15/18   Dorothyann Peng, NP  amLODipine (NORVASC) 10 MG tablet Take 1 tablet (10 mg total) by mouth daily. Patient not taking: Reported on 07/29/2020 04/15/18   Dorothyann Peng, NP  losartan (COZAAR) 100 MG tablet Take 1 tablet (100 mg total) by mouth daily. Patient not taking: Reported on 07/29/2020 06/06/18   Dorothyann Peng, NP  simvastatin (ZOCOR) 40 MG tablet Take 0.5 tablets (20 mg total) by mouth daily. Patient not taking: Reported on 07/29/2020 06/06/18   Dorothyann Peng,  NP    Current Facility-Administered Medications  Medication Dose Route Frequency Provider Last Rate Last Admin  . acetaminophen (TYLENOL) tablet 650 mg  650 mg Oral Daily PRN Wynetta Fines T, MD      . allopurinol (ZYLOPRIM) tablet 200 mg  200 mg Oral Daily Wynetta Fines T, MD   200 mg at 07/30/20 0944  . amLODipine (NORVASC) tablet 10 mg  10 mg Oral Daily Wynetta Fines T, MD   10 mg at 07/30/20 0944  . carvedilol (COREG) tablet 3.125 mg  3.125 mg Oral BID WC Wynetta Fines T, MD   3.125 mg at 07/30/20 0759  . hydrALAZINE (APRESOLINE) tablet 25 mg  25 mg Oral Q6H PRN Wynetta Fines T, MD   25 mg at 07/30/20 0610  . hydrALAZINE (APRESOLINE) tablet 25 mg  25 mg Oral Q8H Regalado, Belkys A, MD   25 mg at 07/30/20 0944  . pantoprazole (PROTONIX) EC tablet 40 mg  40 mg Oral BID Regalado, Belkys A, MD   40 mg at 07/30/20 0944  . polyvinyl alcohol (LIQUIFILM TEARS) 1.4 % ophthalmic solution 1 drop  1  drop Both Eyes Daily PRN Wynetta Fines T, MD      . simvastatin (ZOCOR) tablet 20 mg  20 mg Oral Daily Wynetta Fines T, MD   20 mg at 07/30/20 0944    Allergies as of 07/29/2020 - Review Complete 07/29/2020  Allergen Reaction Noted  . Sulfa antibiotics Other (See Comments) 06/24/2015    Family History  Problem Relation Age of Onset  . Arthritis Neg Hx        family  . Hypertension Neg Hx        family  . Stroke Neg Hx        family , 1st degree relative    Social History   Socioeconomic History  . Marital status: Married    Spouse name: Not on file  . Number of children: Not on file  . Years of education: Not on file  . Highest education level: Not on file  Occupational History  . Not on file  Tobacco Use  . Smoking status: Current Every Day Smoker    Packs/day: 0.30    Types: Cigarettes  . Smokeless tobacco: Never Used  Vaping Use  . Vaping Use: Never used  Substance and Sexual Activity  . Alcohol use: No  . Drug use: No  . Sexual activity: Not on file  Other Topics Concern  . Not on file   Social History Narrative  . Not on file   Social Determinants of Health   Financial Resource Strain: Not on file  Food Insecurity: Not on file  Transportation Needs: Not on file  Physical Activity: Not on file  Stress: Not on file  Social Connections: Not on file  Intimate Partner Violence: Not on file    Review of Systems: Pertinent positive and negative review of systems were noted in the above HPI section.  All other review of systems was otherwise negative. Physical Exam: Vital signs in last 24 hours: Temp:  [97.6 F (36.4 C)-98.3 F (36.8 C)] 97.6 F (36.4 C) (02/26 1054) Pulse Rate:  [51-79] 63 (02/26 1100) Resp:  [14-24] 22 (02/26 1100) BP: (141-238)/(62-113) 167/85 (02/26 1100) SpO2:  [97 %-100 %] 98 % (02/26 1100) Weight:  [77 kg] 77 kg (02/25 2030) Last BM Date: 07/28/20 General:   Alert,  Well-developed, well-nourished, elderly African-American male pleasant and cooperative in NAD Head:  Normocephalic and atraumatic. Eyes:  Sclera clear, no icterus.   Conjunctiva pale Ears:  Normal auditory acuity. Nose:  No deformity, discharge,  or lesions. Mouth:  No deformity or lesions.   Neck:  Supple; no masses or thyromegaly. Lungs:  Clear throughout to auscultation.   No wheezes, crackles, or rhonchi. Heart:  Regular rate and rhythm; no murmurs, clicks, rubs,  or gallops. Abdomen:  Soft,nontender, BS active,nonpalp mass or hsm.  He is tender along the left costal margin to palpation Rectal: Not done today, documented heme positive today Msk:  Symmetrical without gross deformities. . Pulses:  Normal pulses noted. Extremities: 1+ edema bilateral lower extremities Neurologic:  Alert and  oriented x4;  grossly normal neurologically. Skin:  Intact without significant lesions or rashes.. Psych:  Alert and cooperative. Normal mood and affect.  Intake/Output from previous day: 02/25 0701 - 02/26 0700 In: -  Out: 250 [Urine:250] Intake/Output this shift: Total I/O In:  200 [P.O.:200] Out: 325 [Urine:325]  Lab Results: Recent Labs    07/29/20 1204 07/29/20 1248 07/30/20 0513  WBC 6.1  --  6.3  HGB 8.3* 9.2* 7.8*  HCT 27.8* 27.0*  25.5*  PLT 235  --  248   BMET Recent Labs    07/29/20 1204 07/29/20 1248 07/30/20 0513  NA 139 141 138  K 4.5 4.5 3.9  CL 107 108 105  CO2 21*  --  24  GLUCOSE 97 93 104*  BUN 20 29* 21  CREATININE 1.77* 1.80* 1.78*  CALCIUM 8.9  --  9.0   LFT Recent Labs    07/29/20 1204  PROT 7.6  ALBUMIN 3.5  AST 34  ALT 8  ALKPHOS 77  BILITOT 0.7   PT/INR No results for input(s): LABPROT, INR in the last 72 hours. Hepatitis Panel No results for input(s): HEPBSAG, HCVAB, HEPAIGM, HEPBIGM in the last 72 hours.    IMPRESSION:   #72 87 year old African-American male with new finding of iron deficiency anemia and Hemoccult positive stool. By history patient had noted melena for a couple of weeks in January. He has been on daily ibuprofen generally 2/day.  Rule out chronic gastropathy, peptic ulcer disease, occult neoplasm, AVMs  #2 left costal margin pain-suspect costochondritis, #3 abnormal CT with haziness around the porta hepatis, measuring 2.2 x 1.9 cm-rule out confluent inflammation versus neoplasm #3 significant hypertension 4.  Chronic kidney disease stage III 5.  BPH #6 multiple gallstones  Plan; patient received IV Feraheme today. Continue serial hemoglobins and transfuse for hemoglobin 7.5.  Expect he will need a transfusion in the next 24 hours. Continue IV PPI twice daily Regular diet today Stop NSAIDs Patient will need MRI of the abdomen with and without contrast to assess the abnormality noted at the porta hepatis, will order Discussed EGD and colonoscopy with patient.  He is agreeable to proceed.  We will plan bowel prep tomorrow and schedule for EGD and colonoscopy on Monday with Dr. Carlean Purl.    Amy EsterwoodPA-C  07/30/2020, 12:21 PM

## 2020-07-30 NOTE — Progress Notes (Signed)
PROGRESS NOTE    Isaac Phelps  WLS:[id number] DOB: 04-10-1931 DOA: 07/29/2020 PCP: Dorothyann Peng, NP   Brief Narrative: 87 year old with past medical history significant for hype pretension not taking medications, CKD stage IIIb, GERD, gout presented complaining of chest pain rib pain on the left and epigastric pain for 1 month.  Patient complaining of left side rib cage pain for 1 month on and off.  Is been taking Tylenol and Advil as needed for pain.  He presented to the urgent care and was found to have significantly elevated blood pressure systolic blood pressure 811/572 he was referred to the ED for evaluation.  Patient denies any headache, chest pain or shortness of breath.  The ED patient received IV labetalol.  CT angiogram negative for dissection or aneurysmal. Multiples  others incidental findings   Assessment & Plan:   Active Problems:   Essential hypertension   HTN (hypertension), malignant  1-Hypertensive emergency: Secondary to noncompliance with medication. -He received IV labetalol -Patient was a started on amlodipine and Coreg -Plan to schedule hydralazine, increase dose to 50 mg  2-Left-sided rib pain;  Plan to get dedicated rib x-ray to further evaluate  3-Mild elevation troponin, flat likely related to malignant hypertension Echo ordered.  4-Anemia; iron deficiency, slow GI bleed Hd down to 7.9.  Started PPI.  Report mel;ena in December.  GI consulted.  Will Give IV iron.   CT Angio incidentals finding:  A-Soft tissue haziness within the porta hepatic region, etiology is indeterminate.  Differential consideration include confluent inflammation or infiltrative neoplasm -Patient will need MRI with and without contrast  B-Left lower lobe 2.2 cm, part solid nodule; suspicious for low grade indolent pulmonary neoplasm such as adenocarcinoma.  -He will need follow-up with pulmonologist.   C-Left Kidney lesion; needs Follow up MRI.  Exophytic soft tissue  attenuation lesion arising of the medial cortex of the left upper pole of the kidney  D- Enlarge prostate gland with mass-effect upon the base of the bladder: Need follow-up with urology. -will check PSA.   CKD stage IIIb;  Prior cr per records 1.6---2.1 Monitor,.  Cr on admission 1.8.  Estimated body mass index is 23.68 kg/m as calculated from the following:   Height as of this encounter: 5\' 11"  (1.803 m).   Weight as of this encounter: 77 kg.   DVT prophylaxis: SCD Code Status: Full code Family Communication: Discussed with daughter who was at bedside Disposition Plan:  Status is: Observation  The patient remains OBS appropriate and will d/c before 2 midnights.  Dispo: The patient is from: Home              Anticipated d/c is to: Home              Patient currently is not medically stable to d/c.  Went to continue to adjust blood pressure medication for better blood pressure control.  Hemoglobin has decreased further , GI consulted.    Difficult to place patient No        Consultants:   GI  Procedures:     Antimicrobials:    Subjective: He report left side rib pain, localized pain.  He denies shortness of breath.  He reported episode of black stool for several days in December.  He had a bowel movement yesterday, it  was not black denies blood in the stool  Objective: Vitals:   07/29/20 2258 07/30/20 0100 07/30/20 0300 07/30/20 0441  BP: (!) 169/70 (!) 141/65 (!) 148/67 (!) 178/89  Pulse: 60   61  Resp: 19   16  Temp: 98 F (36.7 C)   98.1 F (36.7 C)  TempSrc: Oral   Oral  SpO2: 98%   99%  Weight:      Height:        Intake/Output Summary (Last 24 hours) at 07/30/2020 0708 Last data filed at 07/30/2020 0442 Gross per 24 hour  Intake --  Output 250 ml  Net -250 ml   Filed Weights   07/29/20 2030  Weight: 77 kg    Examination:  General exam: Appears calm and comfortable  Respiratory system: Clear to auscultation. Respiratory effort  normal. Cardiovascular system: S1 & S2 heard, RRR. No JVD, murmurs, rubs, gallops or clicks. No pedal edema. Gastrointestinal system: Abdomen is nondistended, soft and nontender. No organomegaly or masses felt. Normal bowel sounds heard. Central nervous system: Alert and oriented. No focal neurological deficits. Extremities: Symmetric 5 x 5 power. Skin: No rashes, lesions or ulcers   Data Reviewed: I have personally reviewed following labs and imaging studies  CBC: Recent Labs  Lab 07/29/20 1204 07/29/20 1248 07/30/20 0513  WBC 6.1  --  6.3  NEUTROABS 4.6  --   --   HGB 8.3* 9.2* 7.8*  HCT 27.8* 27.0* 25.5*  MCV 74.5*  --  72.0*  PLT 235  --  161   Basic Metabolic Panel: Recent Labs  Lab 07/29/20 1204 07/29/20 1248 07/30/20 0513  NA 139 141 138  K 4.5 4.5 3.9  CL 107 108 105  CO2 21*  --  24  GLUCOSE 97 93 104*  BUN 20 29* 21  CREATININE 1.77* 1.80* 1.78*  CALCIUM 8.9  --  9.0   GFR: Estimated Creatinine Clearance: 31.1 mL/min (A) (by C-G formula based on SCr of 1.78 mg/dL (H)). Liver Function Tests: Recent Labs  Lab 07/29/20 1204  AST 34  ALT 8  ALKPHOS 77  BILITOT 0.7  PROT 7.6  ALBUMIN 3.5   Recent Labs  Lab 07/29/20 1204  LIPASE 36   No results for input(s): AMMONIA in the last 168 hours. Coagulation Profile: No results for input(s): INR, PROTIME in the last 168 hours. Cardiac Enzymes: No results for input(s): CKTOTAL, CKMB, CKMBINDEX, TROPONINI in the last 168 hours. BNP (last 3 results) No results for input(s): PROBNP in the last 8760 hours. HbA1C: No results for input(s): HGBA1C in the last 72 hours. CBG: Recent Labs  Lab 07/29/20 1300  GLUCAP 82   Lipid Profile: No results for input(s): CHOL, HDL, LDLCALC, TRIG, CHOLHDL, LDLDIRECT in the last 72 hours. Thyroid Function Tests: No results for input(s): TSH, T4TOTAL, FREET4, T3FREE, THYROIDAB in the last 72 hours. Anemia Panel: Recent Labs    07/30/20 0513  FERRITIN 7*  TIBC 417   IRON 26*   Sepsis Labs: No results for input(s): PROCALCITON, LATICACIDVEN in the last 168 hours.  Recent Results (from the past 240 hour(s))  SARS CORONAVIRUS 2 (TAT 6-24 HRS) Nasopharyngeal Nasopharyngeal Swab     Status: None   Collection Time: 07/29/20  5:48 PM   Specimen: Nasopharyngeal Swab  Result Value Ref Range Status   SARS Coronavirus 2 NEGATIVE NEGATIVE Final    Comment: (NOTE) SARS-CoV-2 target nucleic acids are NOT DETECTED.  The SARS-CoV-2 RNA is generally detectable in upper and lower respiratory specimens during the acute phase of infection. Negative results do not preclude SARS-CoV-2 infection, do not rule out co-infections with other pathogens, and should not be used as the sole basis  for treatment or other patient management decisions. Negative results must be combined with clinical observations, patient history, and epidemiological information. The expected result is Negative.  Fact Sheet for Patients: SugarRoll.be  Fact Sheet for Healthcare Providers: https://www.woods-mathews.com/  This test is not yet approved or cleared by the [place] FDA and  has been authorized for detection and/or diagnosis of SARS-CoV-2 by FDA under an Emergency Use Authorization (EUA). This EUA will remain  in effect (meaning this test can be used) for the duration of the COVID-19 declaration under Se ction 564(b)(1) of the Act, 21 U.S.C. section 360bbb-3(b)(1), unless the authorization is terminated or revoked sooner.  Performed at [facility], [place] [address], [place], [place] [address]   MRSA PCR Screening     Status: None   Collection Time: 07/29/20  8:34 PM   Specimen: Nasal Mucosa; Nasopharyngeal  Result Value Ref Range Status   MRSA by PCR NEGATIVE NEGATIVE Final    Comment:        The GeneXpert MRSA Assay (FDA approved for NASAL specimens only), is one component of a comprehensive MRSA  colonization surveillance program. It is not intended to diagnose MRSA infection nor to guide or monitor treatment for MRSA infections. Performed at [facility], [place] [address], [place], [place] [address]          Radiology Studies: DG Chest 2 View  Result Date: 07/29/2020 CLINICAL DATA:  Chest pain and hypertension EXAM: CHEST - 2 VIEW COMPARISON:  February 09, 2015 FINDINGS: Lungs are clear. Heart is mildly enlarged with pulmonary vascularity normal. No adenopathy. There is aortic atherosclerosis. No bone lesions. IMPRESSION: Mild cardiomegaly. Lungs clear. Aortic Atherosclerosis (ICD10-I70.0). Electronically Signed   By: Lowella Grip III M.D.   On: 07/29/2020 12:56   CT Head Wo Contrast  Result Date: 07/29/2020 CLINICAL DATA:  Left facial numbness. EXAM: CT HEAD WITHOUT CONTRAST TECHNIQUE: Contiguous axial images were obtained from the base of the skull through the vertex without intravenous contrast. COMPARISON:  None. FINDINGS: Brain: Mild diffuse cortical atrophy is noted. Mild chronic ischemic white matter disease is noted. No mass effect or midline shift is noted. Ventricular size is within normal limits. There is no evidence of mass lesion, hemorrhage or acute infarction. Vascular: No hyperdense vessel or unexpected calcification. Skull: Normal. Negative for fracture or focal lesion. Sinuses/Orbits: No acute finding. Other: None. IMPRESSION: Mild diffuse cortical atrophy. Mild chronic ischemic white matter disease. No acute intracranial abnormality seen. Electronically Signed   By: Marijo Conception M.D.   On: 07/29/2020 14:51   CT Angio Chest/Abd/Pel for Dissection W and/or Wo Contrast  Result Date: 07/29/2020 CLINICAL DATA:  Abdominal pain. Evaluate for abdominal aortic dissection. EXAM: CT ANGIOGRAPHY CHEST, ABDOMEN AND PELVIS TECHNIQUE: Non-contrast CT of the chest was initially obtained. Multidetector CT imaging through the chest, abdomen and pelvis was performed  using the standard protocol during bolus administration of intravenous contrast. Multiplanar reconstructed images and MIPs were obtained and reviewed to evaluate the vascular anatomy. CONTRAST:  174mL OMNIPAQUE IOHEXOL 350 MG/ML SOLN COMPARISON:  CT chest 08/07/2011 FINDINGS: CTA CHEST FINDINGS Cardiovascular: Preferential opacification of the thoracic aorta. No evidence of thoracic aortic aneurysm. Mild dilatation of the transverse aortic arch measures 3.2 cm. Aberrant right subclavian artery. Cardiac enlargement. No pericardial effusion. Mediastinum/Nodes: No enlarged mediastinal, hilar, or axillary lymph nodes. Thyroid gland, trachea, and esophagus demonstrate no significant findings. Similar appearance of fluid attenuating structure within the posterior mediastinum at the level of the hiatus.  This measures 2.7 x 2.5 cm, image 81/7. Previously this measured 2.5 x 2.2 cm. Lungs/Pleura: No pleural effusion. No airspace consolidation. There is a part solid nodule within the superior segment of left lower lobe measuring 2.2 by 1.6 cm with central solid component measuring 6 mm, image 34/9. Previously this was entirely non solid measuring 1.8 x 1.4 cm. Musculoskeletal: No acute or suspicious osseous abnormality. Spondylosis noted within the thoracic spine. Review of the MIP images confirms the above findings. CTA ABDOMEN AND PELVIS FINDINGS VASCULAR Aorta: Normal caliber aorta without aneurysm, dissection, vasculitis or significant stenosis. Aortic atherosclerosis. Celiac: Patent without evidence of aneurysm, dissection, vasculitis or significant stenosis. SMA: Patent without evidence of aneurysm, dissection, vasculitis or significant stenosis. Renals: Both renal arteries are patent without evidence of aneurysm, dissection, vasculitis, fibromuscular dysplasia or significant stenosis. IMA: Patent without evidence of aneurysm, dissection, vasculitis or significant stenosis. Inflow: Patent without evidence of aneurysm,  dissection, vasculitis or significant stenosis. Veins: No obvious venous abnormality within the limitations of this arterial phase study. Review of the MIP images confirms the above findings. NON-VASCULAR Hepatobiliary: Several liver cysts are again noted and appears similar to previous exam. No suspicious liver abnormality. Multiple stones identified within the gallbladder which measures up to 9 mm. No gallbladder wall thickening or pericholecystic fluid. No bile duct dilatation identified. Pancreas: Unremarkable. No pancreatic ductal dilatation or surrounding inflammatory changes. Spleen: Normal in size without focal abnormality. Adrenals/Urinary Tract: Left kidney cysts are noted. The largest arises from the upper pole measuring 3.2 cm. Additionally there are several, less than 1 cm, too small to reliably characterize renal hypodensities. There is a subtle, slightly exophytic soft tissue attenuating lesion arising off the medial cortex of the upper pole of left kidney. This measures 1.2 cm with mean Hounsfield units of 87, image 87/12 and image 101/7. No hydronephrosis identified bilaterally. Urinary bladder is unremarkable. Stomach/Bowel: The stomach is nondistended. The appendix is not confidently identified separate from the right lower quadrant bowel loops. No bowel wall thickening, inflammation, or distension. Lymphatic: There is mild hazy soft tissue within the porta hepatic region surrounding the replaced right hepatic artery measuring approximately 2.2 x 1.9 cm, image 103/7. No retroperitoneal, mesenteric, or pelvic adenopathy. Reproductive: Prostate gland is enlarged and has mass effect upon the base of bladder. Other: Left inguinal hernia contains nonobstructed loop of large bowel. No free fluid or fluid collections identified. 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 Well-defined, nonaggressive appearing sclerotic lesion within L5 is favored to represent a large  bone island. This measures approximately 1.5 cm. No acute or suspicious osseous findings. Review of the MIP images confirms the above findings. IMPRESSION: 1. No evidence for aortic dissection or aneurysm. 2. There is mild hazy soft tissue within the porta hepatic region which encases the replaced right hepatic artery. Etiology is indeterminate. Differential considerations include confluent inflammation or infiltrative neoplasm. In the absence of signs or symptoms of inflammation/infection further investigation with abdominal MRI without and with contrast material is recommended. 3. There is a subtle, slightly exophytic soft tissue attenuating lesion arising off the medial cortex of the upper pole of left kidney. This is indeterminate and may represent a hemorrhagic/proteinaceous cyst versus solid enhancing neoplasm. Further evaluation with contrast enhanced abdominal MRI is recommended. 4. There is a part solid nodule within the superior segment of left lower lobe measuring 2.2 cm with central solid component measuring 6 mm. Previously this was entirely non solid measuring 1.8 x 1.4 cm. This is  considered highly suspicious for low-grade indolent pulmonary neoplasm such as adenocarcinoma. This recommendation follows the consensus statement: Guidelines for Management of Incidental Pulmonary Nodules Detected on CT Images: From the Fleischner Society 2017; Radiology 2017; 284:228-243. 5. Aberrant right subclavian artery. 6. Gallstones. 7. Left inguinal hernia contains nonobstructed loop of large bowel. 8. Enlarged prostate gland with mass effect upon the base of bladder. 9. 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 10. Aortic atherosclerosis. Aortic Atherosclerosis (ICD10-I70.0). Electronically Signed   By: Kerby Moors M.D.   On: 07/29/2020 15:17        Scheduled Meds: . allopurinol  200 mg Oral Daily  . amLODipine  10 mg Oral Daily  . carvedilol  3.125 mg Oral BID WC  . hydrALAZINE  25 mg Oral  Q8H  . pantoprazole  40 mg Oral BID  . simvastatin  20 mg Oral Daily   Continuous Infusions:   LOS: 0 days    Time spent: 35 minutes    Solae Norling A Shariff Lasky, MD [facility]   If 7PM-7AM, please contact night-coverage [email or web address]  07/30/2020, 7:08 AM

## 2020-07-31 ENCOUNTER — Inpatient Hospital Stay (HOSPITAL_COMMUNITY): Payer: Medicare Other

## 2020-07-31 DIAGNOSIS — K921 Melena: Secondary | ICD-10-CM

## 2020-07-31 DIAGNOSIS — D509 Iron deficiency anemia, unspecified: Secondary | ICD-10-CM | POA: Diagnosis not present

## 2020-07-31 DIAGNOSIS — I639 Cerebral infarction, unspecified: Secondary | ICD-10-CM

## 2020-07-31 DIAGNOSIS — I10 Essential (primary) hypertension: Secondary | ICD-10-CM | POA: Diagnosis not present

## 2020-07-31 LAB — HEMOGLOBIN AND HEMATOCRIT, BLOOD
HCT: 26.2 % — ABNORMAL LOW (ref 39.0–52.0)
HCT: 29.2 % — ABNORMAL LOW (ref 39.0–52.0)
Hemoglobin: 7.8 g/dL — ABNORMAL LOW (ref 13.0–17.0)
Hemoglobin: 8.5 g/dL — ABNORMAL LOW (ref 13.0–17.0)

## 2020-07-31 LAB — BASIC METABOLIC PANEL
Anion gap: 11 (ref 5–15)
BUN: 25 mg/dL — ABNORMAL HIGH (ref 8–23)
CO2: 23 mmol/L (ref 22–32)
Calcium: 9.2 mg/dL (ref 8.9–10.3)
Chloride: 103 mmol/L (ref 98–111)
Creatinine, Ser: 2.05 mg/dL — ABNORMAL HIGH (ref 0.61–1.24)
GFR, Estimated: 31 mL/min — ABNORMAL LOW (ref >60)
Glucose, Bld: 111 mg/dL — ABNORMAL HIGH (ref 70–99)
Potassium: 4.1 mmol/L (ref 3.5–5.1)
Sodium: 137 mmol/L (ref 135–145)

## 2020-07-31 LAB — VITAMIN B12: Vitamin B-12: 161 pg/mL — ABNORMAL LOW (ref 180–914)

## 2020-07-31 LAB — AFP TUMOR MARKER: AFP, Serum, Tumor Marker: 1.8 ng/mL (ref 0.0–8.3)

## 2020-07-31 IMAGING — MR MR MRA HEAD W/O CM
1 series · 20 of 48 positions shown · non-contrast
Comparison: MRI same day

CLINICAL DATA: Left-sided numbness. Bilateral infarction shown by
MRI.

EXAM:
MRA HEAD WITHOUT CONTRAST
TECHNIQUE: Angiographic images of the Circle of Willis were obtained using MRA
technique without intravenous contrast.

[Series 3: ax (id) · axial · 1.0mm · 0.43mm/px · z∈[-64,+16]mm · 20 of 176 slices shown]
[im 1/176]
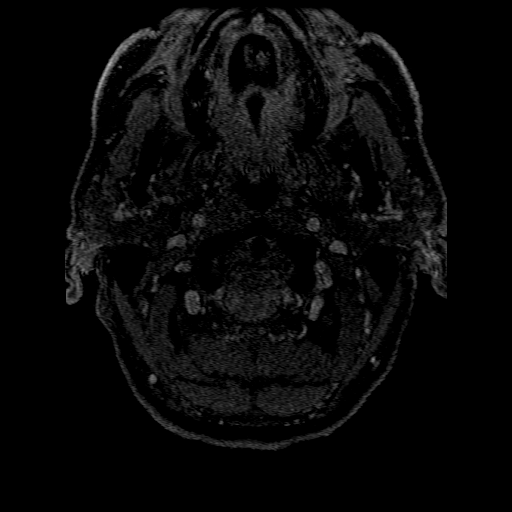
[im 4/176]
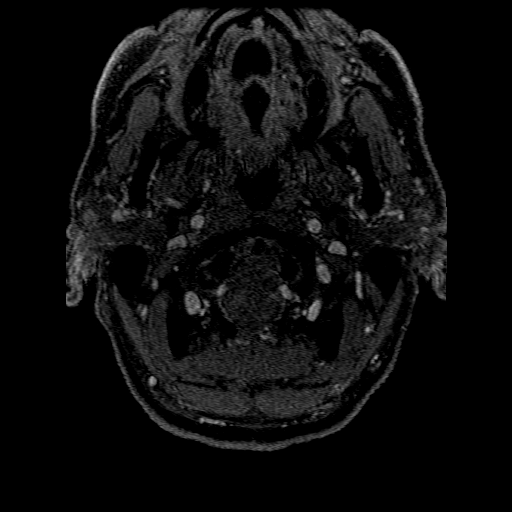
[im 8/176]
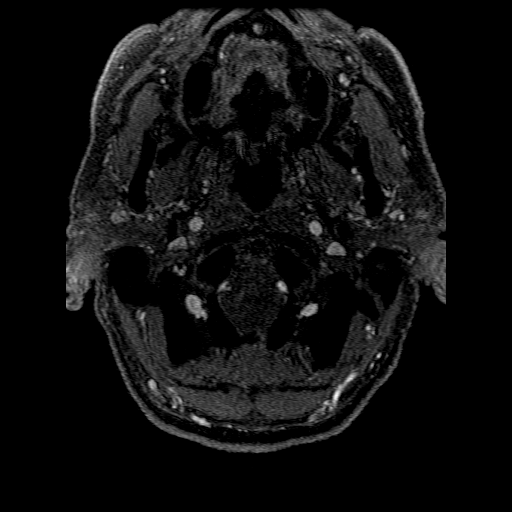
[im 12/176]
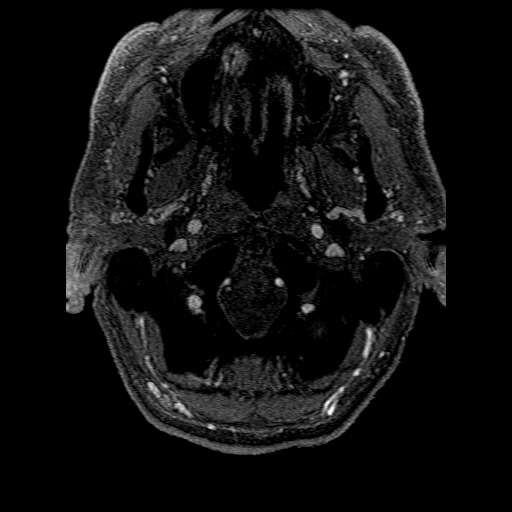
[im 15/176]
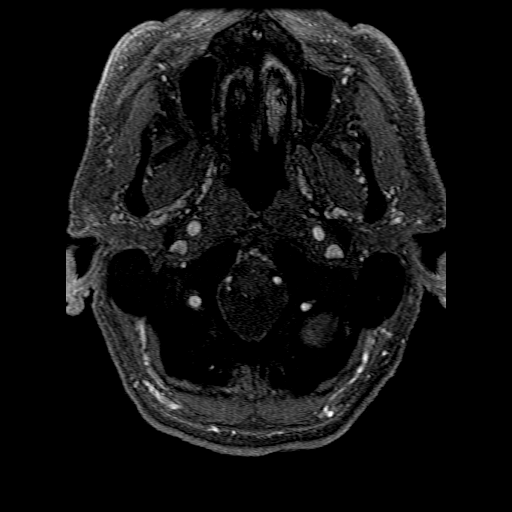
[im 19/176]
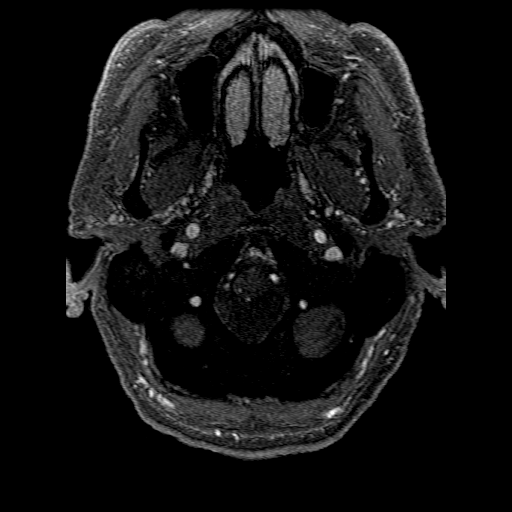
[im 23/176]
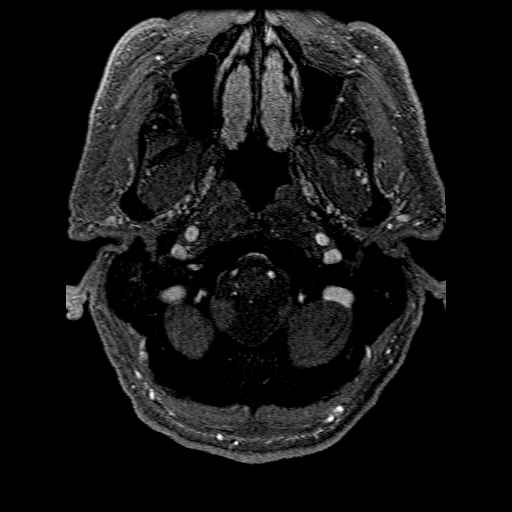
[im 27/176]
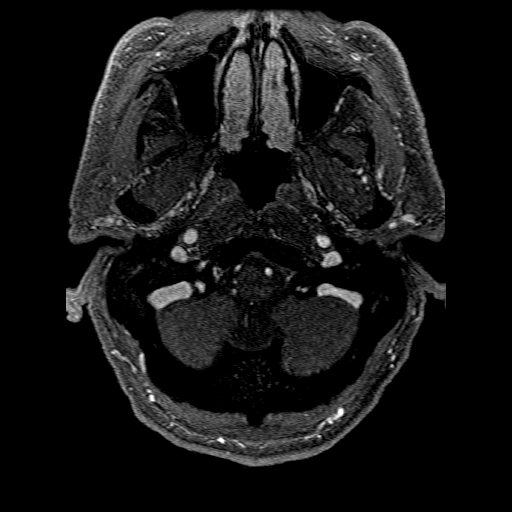
[im 30/176]
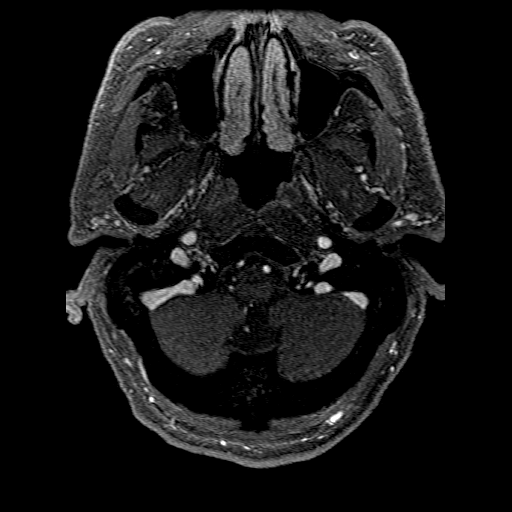
[im 34/176]
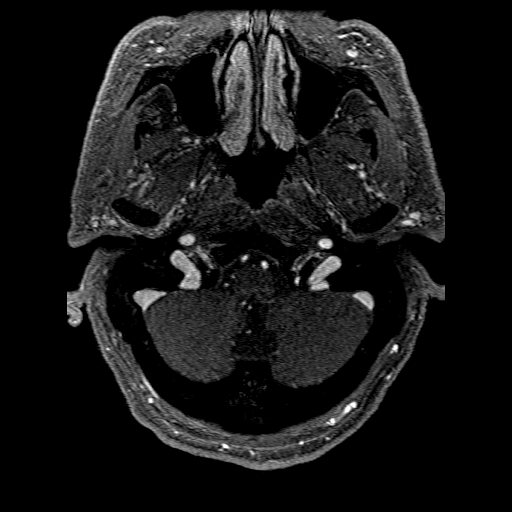
[im 38/176]
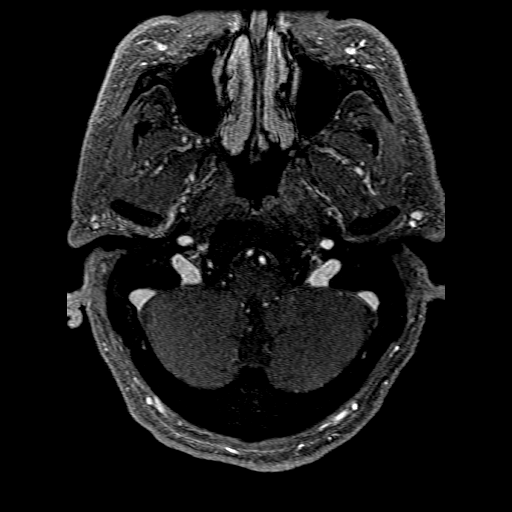
[im 41/176]
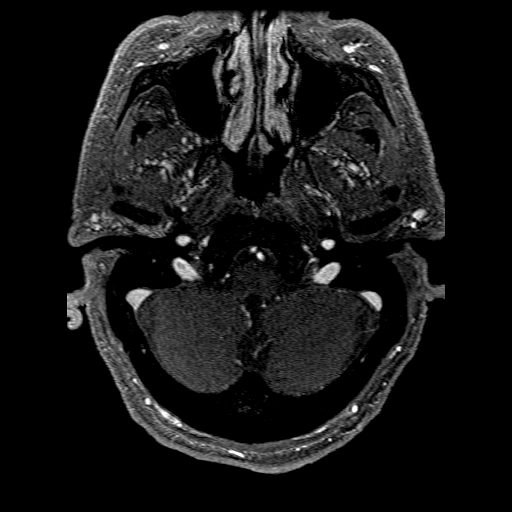
[im 56/176]
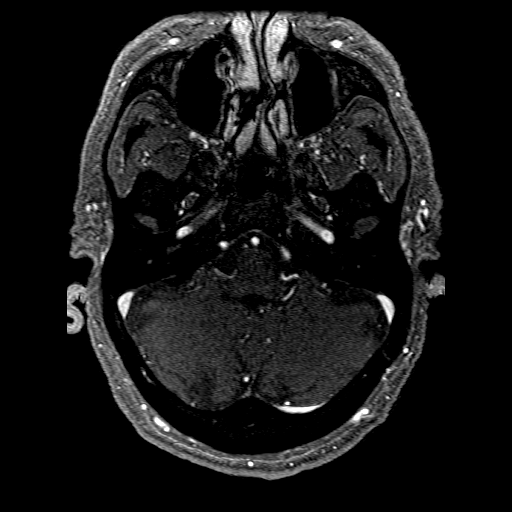
[im 79/176]
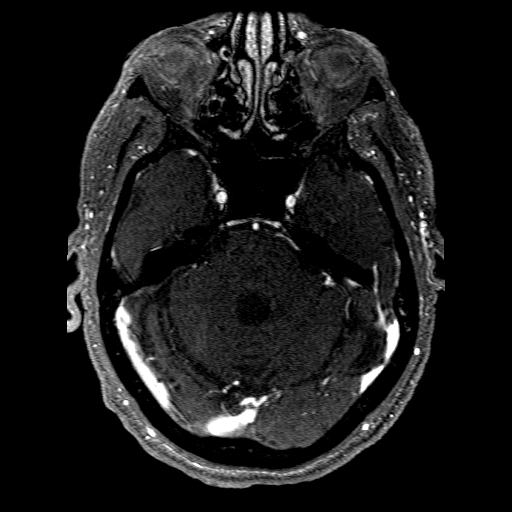
[im 90/176]
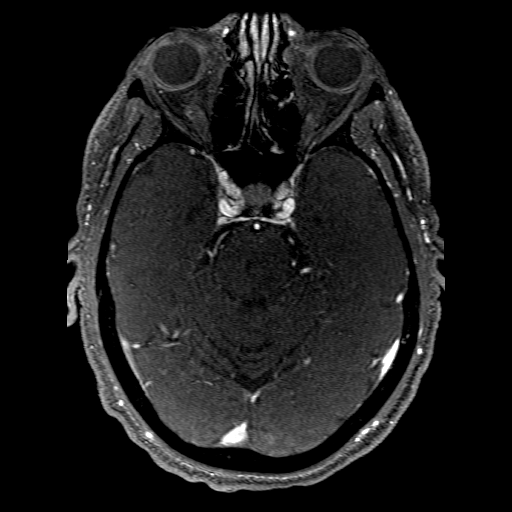
[im 101/176]
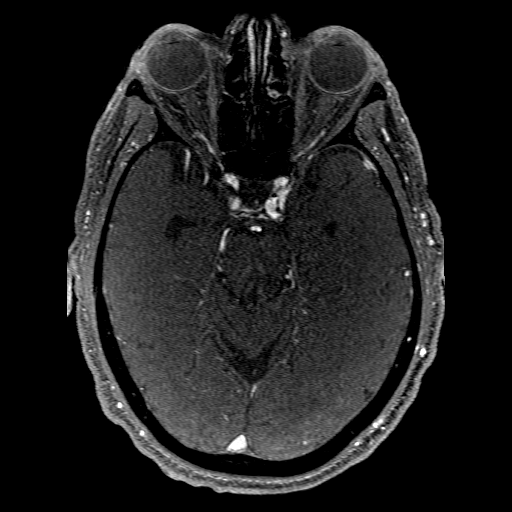
[im 123/176]
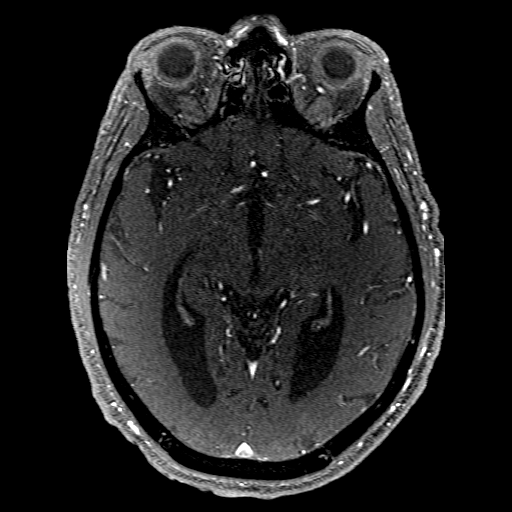
[im 146/176]
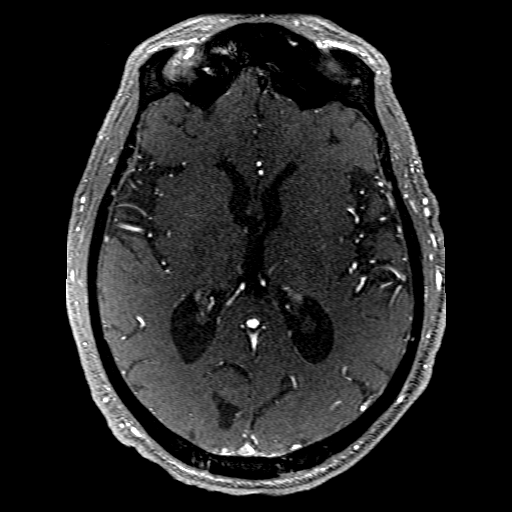
[im 149/176]
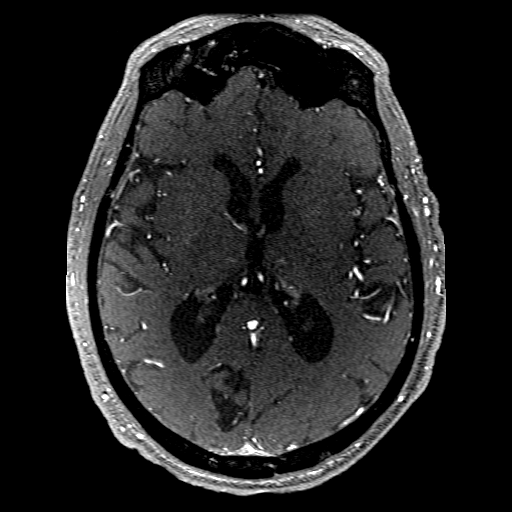
[im 168/176]
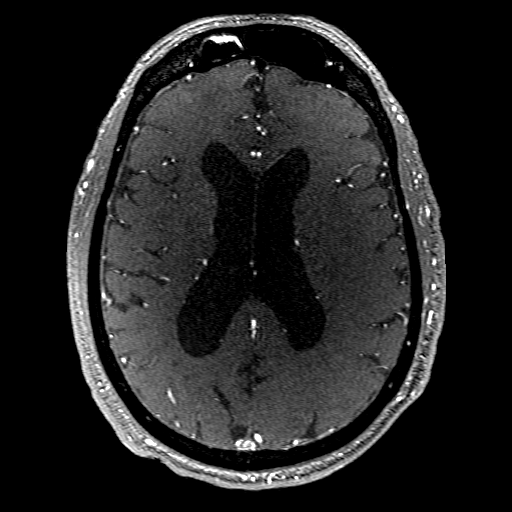

[20 of 48 positions shown; findings below may reference images not displayed]

FINDINGS: The study is somewhat hindered by the presence of T1 shortening age
in, probably Feraheme.

Both internal carotid arteries appear widely patent through the
skull base and siphon regions. The anterior and middle cerebral
vessels are patent without proximal stenosis, aneurysm or vascular
malformation. No sign of large or medium vessel occlusion.

Both vertebral arteries are widely patent to the basilar. No basilar
stenosis. Posterior circulation branch vessels appear normal.
IMPRESSION: Artifact related to intravascular T1 shortening agent, probably
Feraheme or similar.

Normal intracranial MR angiography of the large and medium size
vessels allowing for that.

## 2020-07-31 IMAGING — MR MR HEAD W/O CM
10 of 11 series · 43 of 48 positions shown · non-contrast
Comparison: [DATE].

CLINICAL DATA: Neuro deficit, acute, stroke suspected

EXAM:
MRI HEAD WITHOUT CONTRAST
TECHNIQUE: Multiplanar, multiecho pulse sequences of the brain and surrounding
structures were obtained without intravenous contrast.

[Series 5: DWI · axial · 3.0mm · 0.88mm/px · z∈[-169,-22]mm · 10 of 104 slices shown (1 of 4)]
[im 1/104]
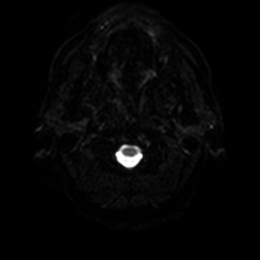
[im 12/104]
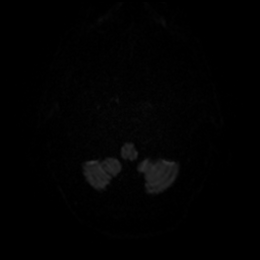
[im 23/104]
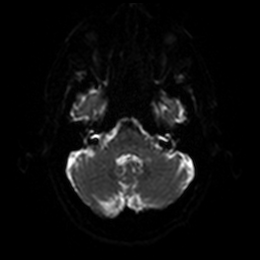
[im 35/104]
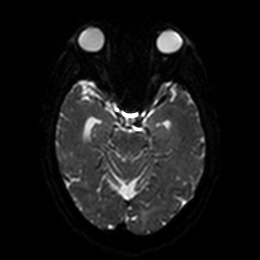
[im 46/104]
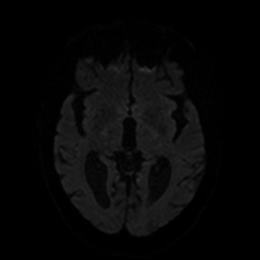
[im 58/104]
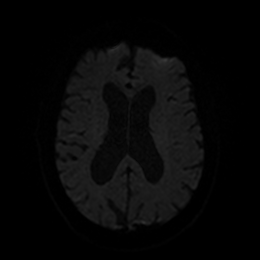
[im 69/104]
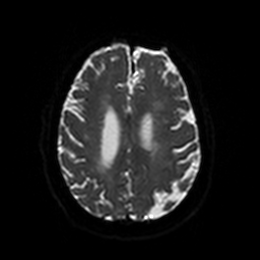
[im 81/104]
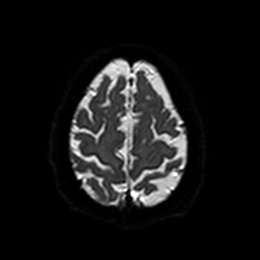
[im 92/104]
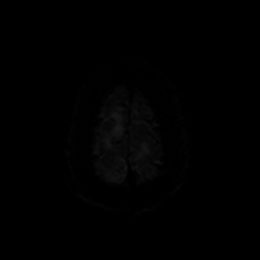
[im 104/104]
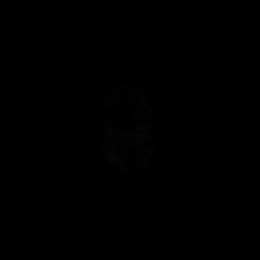

[Series 6: DWI · axial · 3.0mm · 0.88mm/px · z∈[-169,-22]mm · 5 of 52 slices shown (2 of 4)]
[im 1/52]
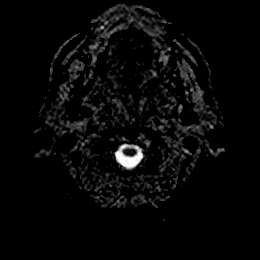
[im 13/52]
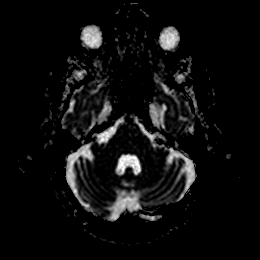
[im 26/52]
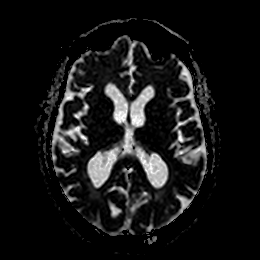
[im 39/52]
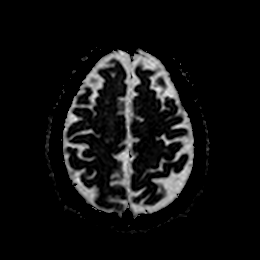
[im 52/52]
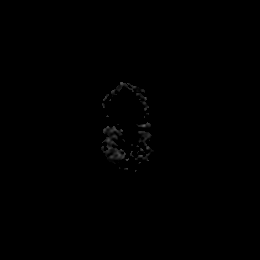

[Series 7: DWI · coronal · 4.0mm · 0.88mm/px · 6 of 72 slices shown (3 of 4)]
[im 1/72]
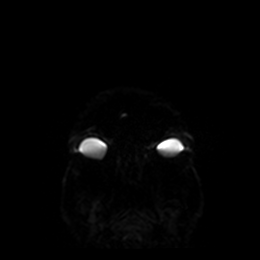
[im 15/72]
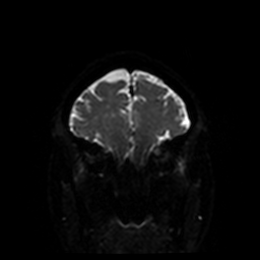
[im 29/72]
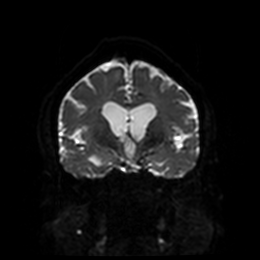
[im 43/72]
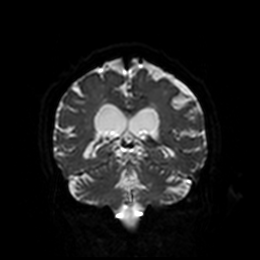
[im 57/72]
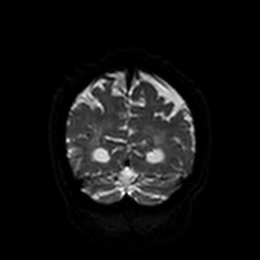
[im 72/72]
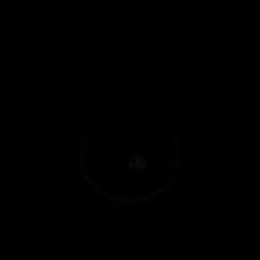

[Series 8: DWI · coronal · 4.0mm · 0.88mm/px · 3 of 36 slices shown (4 of 4)]
[im 1/36]
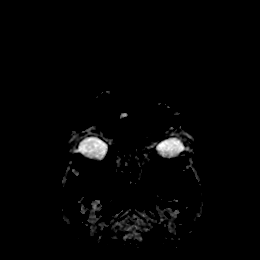
[im 18/36]
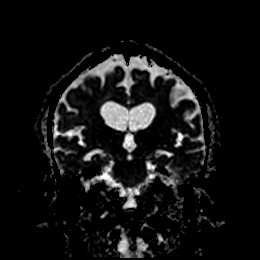
[im 36/36]
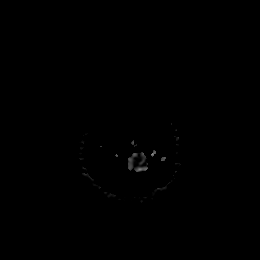

[Series 9: T1 · sagittal · 5.0mm · 0.75mm/px · 2 of 25 slices shown]
[im 1/25]
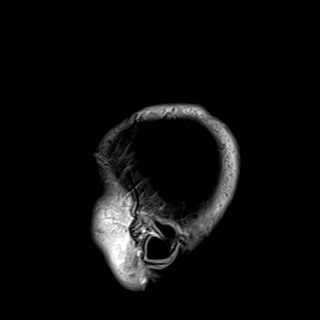
[im 25/25]
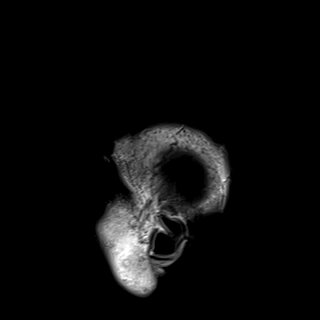

[Series 10: FLAIR · axial · 5.0mm · 0.45mm/px · z∈[-163,-26]mm · 2 of 25 slices shown]
[im 1/25]
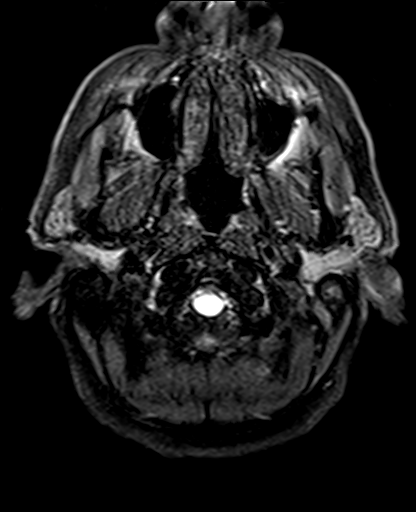
[im 25/25]
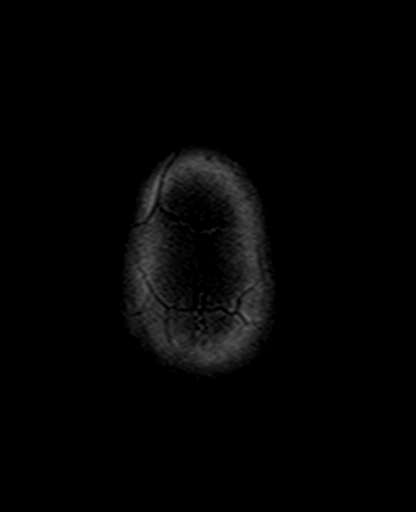

[Series 12: pha_images · axial · 3.0mm · 0.90mm/px · z∈[-168,-10]mm · 5 of 54 slices shown]
[im 1/54]
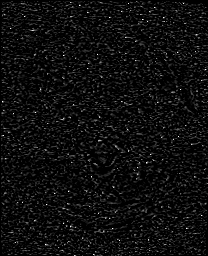
[im 14/54]
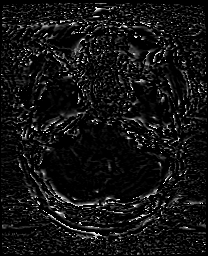
[im 27/54]
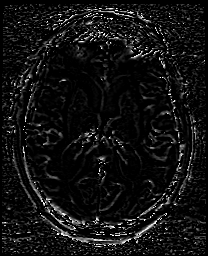
[im 40/54]
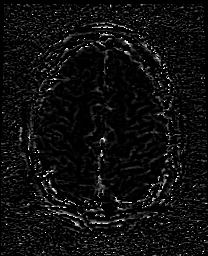
[im 54/54]
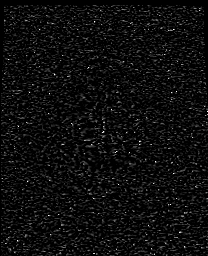

[Series 13: swi_images · axial · 3.0mm · 0.90mm/px · z∈[-168,-10]mm · 5 of 56 slices shown]
[im 1/56]
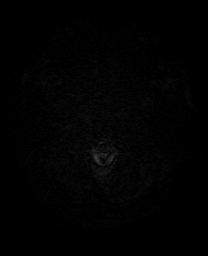
[im 14/56]
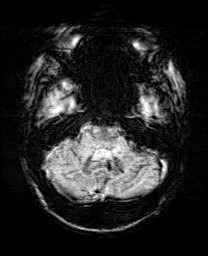
[im 28/56]
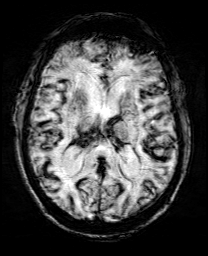
[im 42/56]
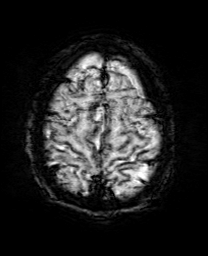
[im 56/56]
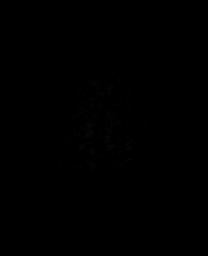

[Series 15: T2 · axial · 5.0mm · 0.72mm/px · z∈[-164,-26]mm · 2 of 25 slices shown (1 of 2)]
[im 1/25]
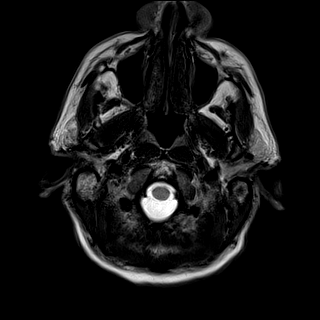
[im 25/25]
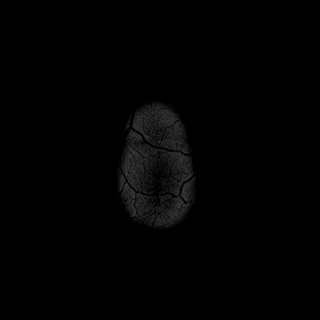

[Series 17: T2 · coronal · 5.0mm · 0.34mm/px · 3 of 30 slices shown (2 of 2)]
[im 1/30]
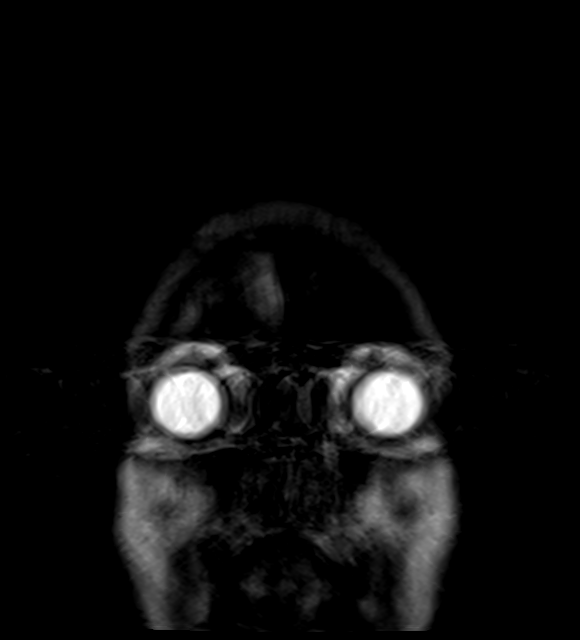
[im 15/30]
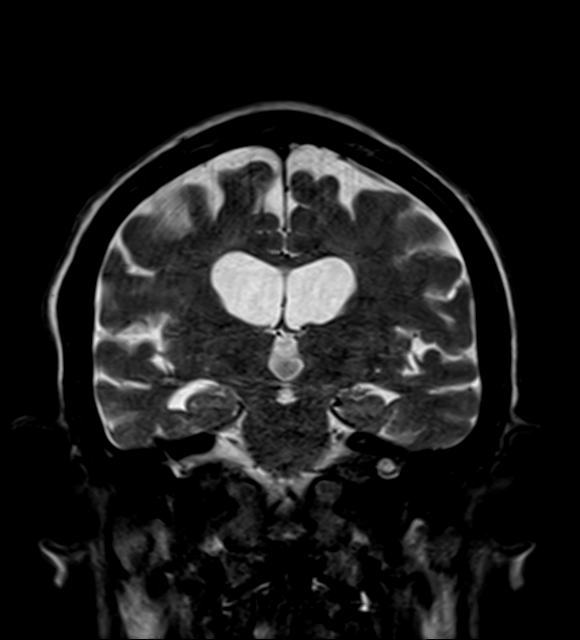
[im 30/30]
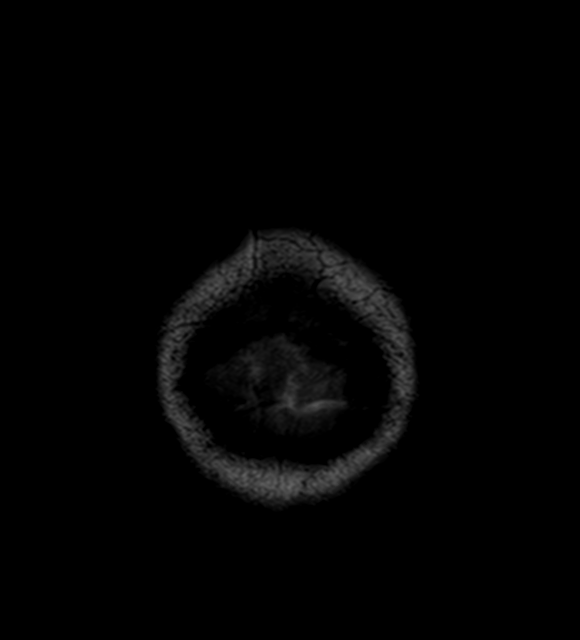

[43 of 48 positions shown; findings below may reference images not displayed]

FINDINGS: Please note some image sequences are mildly degraded by motion
artifact.

Brain: Scattered small acute infarcts involving the left frontal
lobe, left occipital cortex and right periatrial white matter. Query
late subacute/early chronic right corona radiata and left frontal
white matter insults. Mild cerebral atrophy with ex vacuo
dilatation. Mild chronic microvascular ischemic changes. No
intracranial hemorrhage. Chronic right thalamic insult. No midline
shift, ventriculomegaly or extra-axial fluid collection. No mass
lesion.

Vascular: Normal flow voids.

Skull and upper cervical spine: Normal marrow signal.

Sinuses/Orbits: Normal orbits. Clear paranasal sinuses. No mastoid
effusion.

Other: Sequela of recent Feraheme transfusion limits evaluation on
SWI.
IMPRESSION: Small acute left frontal, left occipital and right periatrial white
matter infarcts.

Query late subacute/early chronic right corona radiata and left
frontal white matter insult.

Chronic right thalamic insult. Mild chronic microvascular ischemic
changes.

These results were called by telephone at the time of interpretation
on [DATE] at [DATE] to provider JEFF , who verbally
acknowledged these results.

## 2020-07-31 MED ORDER — STROKE: EARLY STAGES OF RECOVERY BOOK
Freq: Once | Status: AC
  Filled 2020-07-31: qty 1

## 2020-07-31 MED ORDER — PANTOPRAZOLE SODIUM 40 MG IV SOLR: 40.0000 mg | Freq: Two times a day (BID) | INTRAVENOUS | Status: DC

## 2020-07-31 MED ORDER — TAMSULOSIN HCL 0.4 MG PO CAPS
0.4000 mg | ORAL_CAPSULE | Freq: Every day | ORAL | Status: DC
  Administered 2020-07-31 – 2020-08-04 (×5): 0.4 mg via ORAL
  Filled 2020-07-31 (×5): qty 1

## 2020-07-31 MED ORDER — SODIUM CHLORIDE 0.9 % IV SOLN: INTRAVENOUS | Status: AC

## 2020-07-31 MED ORDER — HYDRALAZINE HCL 50 MG PO TABS
50.0000 mg | ORAL_TABLET | Freq: Three times a day (TID) | ORAL | Status: DC
  Administered 2020-07-31 – 2020-08-04 (×10): 50 mg via ORAL
  Filled 2020-07-31 (×10): qty 1

## 2020-07-31 MED ORDER — PANTOPRAZOLE SODIUM 40 MG PO TBEC
40.0000 mg | DELAYED_RELEASE_TABLET | Freq: Two times a day (BID) | ORAL | Status: DC
  Administered 2020-07-31 (×2): 40 mg via ORAL
  Filled 2020-07-31 (×2): qty 1

## 2020-07-31 MED ORDER — CYANOCOBALAMIN 1000 MCG/ML IJ SOLN
1000.0000 ug | Freq: Every day | INTRAMUSCULAR | Status: DC
  Administered 2020-07-31 – 2020-08-04 (×5): 1000 ug via INTRAMUSCULAR
  Filled 2020-07-31 (×5): qty 1

## 2020-07-31 MED ORDER — ARTIFICIAL TEARS OPHTHALMIC OINT
TOPICAL_OINTMENT | OPHTHALMIC | Status: DC | PRN
  Filled 2020-07-31: qty 3.5

## 2020-07-31 NOTE — Consult Note (Addendum)
Neurology Consultation  Reason for Consult: Abnormal brain imaging which was performed to evaluate for left hand and face numbness Referring Physician: Dr. Niel Hummer  CC: Sided face and hand numbness  History is obtained from: Patient, chart  HPI: COREN SAGAN is a 87 y.o. male who has a past medical history of coronary artery disease, hypertension noncompliant to medications, CKD 3, hyperlipidemia, tobacco abuse, admitted to the hospital since 07/29/2020, for evaluation of complaints of chest pain and rib pain for about 1 month.  He said on admission, per the H&P that he had left-sided rib cage pain off and on for 1 month most of the times that happened when he was sleeping with aching-like character and tenderness to palpation.  No fevers chills.  His blood pressure was extremely high and he was admitted for hypertensive urgency treatment. To his primary hospitalist today, he mentioned that he was having some left facial and hand numbness although the timing of this has been going on for 1 month. An MRI of the brain was obtained in light of the reported findings of left-sided numbness and showed small acute left frontal, left occipital and right parietal white matter infarcts and a question of possible late subacute or early chronic right corona radiata and left frontal white matter infarcts along with chronic right thalamic insults. Neurology consultation was obtained because of the above.  During this hospitalization, also getting GI work-up for anemia.  Hemoglobin baseline somewhere in the 11 range, was noted to be in the 8-9 range.  Is off of antiplatelets at this time due to concern for GI bleed.  There was a plan for EGD/colonoscopy to evaluate further.  He denies any current chest pain or shortness of breath. He does continue to report left facial numbness and left hand numbness in the ring finger and little finger. Does not report sudden onset of symptoms.   LKW: Unclear tpa  given?: no, unclear last well Premorbid modified Rankin scale (mRS):0  ROS: A 14 point ROS was performed and is negative except as noted in the HPI.   Past Medical History:  Diagnosis Date  . Anxiety   . BENIGN PROSTATIC HYPERTROPHY 04/01/2008  . Coronary artery disease   . GERD (gastroesophageal reflux disease)   . HYPERLIPIDEMIA 05/26/2007  . HYPERTENSION 02/07/2007  . Syncope and collapse   . TOBACCO ABUSE 04/01/2008    Family History  Problem Relation Age of Onset  . Arthritis Neg Hx        family  . Hypertension Neg Hx        family  . Stroke Neg Hx        family , 1st degree relative     Social History:   reports that he has been smoking cigarettes. He has been smoking about 0.30 packs per day. He has never used smokeless tobacco. He reports that he does not drink alcohol and does not use drugs.  Medications  Current Facility-Administered Medications:  .  0.9 %  sodium chloride infusion, , Intravenous, Continuous, Regalado, Belkys A, MD .  acetaminophen (TYLENOL) tablet 650 mg, 650 mg, Oral, Daily PRN, Wynetta Fines T, MD .  allopurinol (ZYLOPRIM) tablet 200 mg, 200 mg, Oral, Daily, Wynetta Fines T, MD, 200 mg at 07/31/20 1107 .  amLODipine (NORVASC) tablet 10 mg, 10 mg, Oral, Daily, Wynetta Fines T, MD, 10 mg at 07/31/20 1107 .  artificial tears (LACRILUBE) ophthalmic ointment, , Left Eye, Q4H PRN, Regalado, Belkys A, MD .  carvedilol (COREG) tablet 3.125 mg, 3.125 mg, Oral, BID WC, Wynetta Fines T, MD, 3.125 mg at 07/31/20 [phone] .  cyanocobalamin ((VITAMIN B-12)) injection 1,000 mcg, 1,000 mcg, Intramuscular, Daily, Regalado, Belkys A, MD .  hydrALAZINE (APRESOLINE) injection 5 mg, 5 mg, Intravenous, Q6H PRN, Regalado, Belkys A, MD .  hydrALAZINE (APRESOLINE) tablet 50 mg, 50 mg, Oral, Q8H, Regalado, Belkys A, MD .  pantoprazole (PROTONIX) EC tablet 40 mg, 40 mg, Oral, BID, Esterwood, Amy S, PA-C, 40 mg at 07/31/20 1305 .  polyvinyl alcohol (LIQUIFILM TEARS) 1.4 % ophthalmic  solution 1 drop, 1 drop, Both Eyes, Daily PRN, Wynetta Fines T, MD .  simvastatin (ZOCOR) tablet 20 mg, 20 mg, Oral, Daily, Wynetta Fines T, MD, 20 mg at 07/31/20 1107 .  tamsulosin (FLOMAX) capsule 0.4 mg, 0.4 mg, Oral, Daily, Regalado, Belkys A, MD, 0.4 mg at 07/31/20 1107   Exam: Current vital signs: BP (!) 159/59 (BP Location: Left Arm)   Pulse 69   Temp 98.2 F (36.8 C) (Oral)   Resp (!) 21   Ht 5\' 11"  (1.803 m)   Wt 77 kg   SpO2 96%   BMI 23.68 kg/m  Vital signs in last 24 hours: Temp:  [97.8 F (36.6 C)-98.7 F (37.1 C)] 98.2 F (36.8 C) (02/27 1522) Pulse Rate:  [59-69] 69 (02/27 0833) Resp:  [18-21] 21 (02/27 1522) BP: (131-184)/(53-92) 159/59 (02/27 1522) SpO2:  [95 %-100 %] 96 % (02/27 1522)  GENERAL: Awake, alert in NAD HEENT: - Normocephalic and atraumatic, dry mm, no LN++, no Thyromegally LUNGS - Clear to auscultation bilaterally with no wheezes CV - S1S2 RRR, no m/r/g, equal pulses bilaterally. ABDOMEN - Soft, nontender, nondistended with normoactive BS Ext: warm, well perfused, intact peripheral pulses, no edema  NEURO:  Mental Status: AA&Ox3  Language: speech is not dysarthric.  Naming, repetition, fluency, and comprehension intact. Cranial Nerves: PERRL EOMI, visual fields full, no facial asymmetry, facial sensation diminished on the left to light touch, hearing intact, tongue/uvula/soft palate midline, normal sternocleidomastoid and trapezius muscle strength. No evidence of tongue atrophy or fibrillations Motor: 5/5 with no drift in any of the extremities Tone: is normal and bulk is normal Sensation- Intact to light touch bilaterally with the exception of the face as documented above. Coordination: FTN intact bilaterally, no ataxia in BLE. Gait- deferred  NIHSS-1 for sensory changes on left face   Labs I have reviewed labs in epic and the results pertinent to this consultation are: CBC    Component Value Date/Time   WBC 6.3 07/30/2020 0513   RBC  3.54 (L) 07/30/2020 0513   HGB 8.5 (L) 07/31/2020 1215   HCT 29.2 (L) 07/31/2020 1215   PLT 248 07/30/2020 0513   MCV 72.0 (L) 07/30/2020 0513   MCH 22.0 (L) 07/30/2020 0513   MCHC 30.6 07/30/2020 0513   RDW 17.2 (H) 07/30/2020 0513   LYMPHSABS 0.8 07/29/2020 1204   MONOABS 0.5 07/29/2020 1204   EOSABS 0.1 07/29/2020 1204   BASOSABS 0.0 07/29/2020 1204    CMP     Component Value Date/Time   NA 137 07/31/2020 0127   K 4.1 07/31/2020 0127   CL 103 07/31/2020 0127   CO2 23 07/31/2020 0127   GLUCOSE 111 (H) 07/31/2020 0127   GLUCOSE 102 (H) 03/22/2006 0959   BUN 25 (H) 07/31/2020 0127   CREATININE 2.05 (H) 07/31/2020 0127   CALCIUM 9.2 07/31/2020 0127   PROT 7.6 07/29/2020 1204   ALBUMIN 3.5 07/29/2020 1204  AST 34 07/29/2020 1204   ALT 8 07/29/2020 1204   ALKPHOS 77 07/29/2020 1204   BILITOT 0.7 07/29/2020 1204   GFRNONAA 31 (L) 07/31/2020 0127   GFRAA 42 (L) 02/14/2015 0443    Lipid Panel     Component Value Date/Time   CHOL 142 02/18/2017 0900   TRIG 59.0 02/18/2017 0900   TRIG 79 03/22/2006 0959   HDL 46.10 02/18/2017 0900   CHOLHDL 3 02/18/2017 0900   VLDL 11.8 02/18/2017 0900   LDLCALC 84 02/18/2017 0900   LDLDIRECT 151.4 05/26/2007 0947   2D echo from yesterday Ejection fraction 60 to 65%, moderately dilated left atrial size  Imaging I have reviewed the images obtained: MRI examination of the brain: small acute left frontal, left occipital and right parietal white matter infarcts and a question of possible late subacute or early chronic right corona radiata and left frontal white matter infarcts along with chronic right thalamic insults.   Assessment: 87 year old with above past medical history with left facial and hand numbness that has been ongoing for the past few weeks to about a month with evidence of subacute and acute scattered infarcts in bilateral cerebral hemispheres with suspicion for embolic etiology.  Came in hypertensive urgency-concomitant  small vessel disease could also be considered as etiology 2D echo with moderate atrial dilatation on the left-raises suspicion for possible paroxysmal atrial fibrillation but no report of documented A. Fib. Less likely to be hypoperfusion given presentation with high blood pressures and no evidence of hypotension.  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 No aspirin or Plavix due to concern for active GI bleed for now If telemetry confirms atrial fibrillation, in the long-term, would require anticoagulation.  This would require coordination with medicine, and GI. 2D echo has been completed yesterday. Obtain A1c Lipid panel MRA head and Carotid doppler PT OT speech therapy N.p.o. until cleared by bedside swallow evaluation  Stroke team will follow.  Discussed with the primary hospitalist via secure chat.  -- Amie Portland, MD Neurologist [facility] Pager: [phone]

## 2020-07-31 NOTE — Progress Notes (Addendum)
PROGRESS NOTE    Isaac Phelps  TAV:[id number] DOB: [birth year] DOA: 07/29/2020 PCP: Dorothyann Peng, NP   Brief Narrative: 87 year old with past medical history significant for hype pretension not taking medications, CKD stage IIIb, GERD, gout presented complaining of chest pain rib pain on the left and epigastric pain for 1 month.  Patient complaining of left side rib cage pain for 1 month on and off.  Is been taking Tylenol and Advil as needed for pain.  He presented to the urgent care and was found to have significantly elevated blood pressure systolic blood pressure 553/748 he was referred to the ED for evaluation.  Patient denies any headache, chest pain or shortness of breath.  The ED patient received IV labetalol.  CT angiogram negative for dissection or aneurysmal. Multiples  others incidental findings   Assessment & Plan:   Active Problems:   Essential hypertension   HTN (hypertension), malignant   Iron deficiency anemia   Abnormal CT of liver  1-Hypertensive emergency: Secondary to noncompliance with medication. -He received IV labetalol -Patient was a started on amlodipine and Coreg and hydralazine.  -BP better controlled.   2-Left-sided rib pain;  Dedicated rib x ray negative.   3-Mild elevation troponin, flat likely related to malignant hypertension Echo: Normal ejection fraction, diastolic dysfunction grade 2.   4-Anemia; iron deficiency, slow GI bleed Hd down to 7.9--7.8 Started PPI.  Report melena.  GI consulted.  Plan for endoscopy and colonoscopy tomorrow Received IV iron 2/26.  CT Angio incidentals finding:  A-Soft tissue haziness within the porta hepatic region, etiology is indeterminate.  Differential consideration include confluent inflammation or infiltrative neoplasm: -MRI negative for mass, in the porta hepatic region.   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  -He  will need follow-up with pulmonologist.   C-Left Kidney lesion; needs Follow up MRI.  Exophytic soft tissue attenuation lesion arising of the medial cortex of the left upper pole of the kidney  D- Enlarge prostate gland with mass-effect upon the base of the bladder: Need follow-up with urology. -will check PSA.   CKD stage IIIb;  Prior cr per records 1.6---2.1 Monitor,.  Creatinine increased to 2.1 Cr on admission 1.8. Difficulty voiding, plan to start Flomax.  Left side Facial numbness; he has had this symptoms for Months. Is localized to lips and left eye.  Check B12 and MRI of the brain Addendum;  MRI positive for stroke. Neurology consulted. I send message to GI as well.   Estimated body mass index is 23.68 kg/m as calculated from the following:   Height as of this encounter: _0  (1.803 m).   Weight as of this encounter: 77 kg.   DVT prophylaxis: SCD Code Status: Full code Family Communication: Discussed with daughter who was at bedside 2/26 Disposition Plan:  Status is: Observation  The patient remains OBS appropriate and will d/c before 2 midnights.  Dispo: The patient is from: Home              Anticipated d/c is to: Home              Patient currently is not medically stable to d/c.  Went to continue to adjust blood pressure medication for better blood pressure control.  Hemoglobin has decreased further , GI consulted.    Difficult to place patient No        Consultants:   GI  Procedures:  Antimicrobials:    Subjective: He does not have any new complaints.  He report left side facial numbness for months.  Difficulty completely emptying bladder.   Objective: Vitals:   07/30/20 2105 07/30/20 2300 07/30/20 2312 07/31/20 0229  BP: (!) 156/64  139/61 (!) 146/58  Pulse:   (!) 59 60  Resp:  _0 Temp:   98.4 F (36.9 C) 98.1 F (36.7 C)  TempSrc:   Oral Oral  SpO2:   98% 100%  Weight:      Height:        Intake/Output Summary  (Last 24 hours) at 07/31/2020 0705 Last data filed at 07/30/2020 2300 Gross per 24 hour  Intake 440 ml  Output 775 ml  Net -335 ml   Filed Weights   07/29/20 2030  Weight: 77 kg    Examination:  General exam: NAD  Respiratory system: CTA Cardiovascular system: S 1, S 2 RRR Gastrointestinal system: BS present, soft, nt Central nervous system: alert non focal other than left side facial numbness Extremities: no edema  Data Reviewed: I have personally reviewed following labs and imaging studies  CBC: Recent Labs  Lab 07/29/20 1204 07/29/20 1248 07/30/20 0513 07/30/20 1338 07/30/20 1857 07/31/20 0127  WBC 6.1  --  6.3  --   --   --   NEUTROABS 4.6  --   --   --   --   --   HGB 8.3* 9.2* 7.8* 8.7* 7.5* 7.8*  HCT 27.8* 27.0* 25.5* 29.8* 25.3* 26.2*  MCV 74.5*  --  72.0*  --   --   --   PLT 235  --  248  --   --   --    Basic Metabolic Panel: Recent Labs  Lab 07/29/20 1204 07/29/20 1248 07/30/20 0513  NA 139 141 138  K 4.5 4.5 3.9  CL 107 108 105  CO2 21*  --  24  GLUCOSE 97 93 104*  BUN 20 29* 21  CREATININE 1.77* 1.80* 1.78*  CALCIUM 8.9  --  9.0   GFR: Estimated Creatinine Clearance: 31.1 mL/min (A) (by C-G formula based on SCr of 1.78 mg/dL (H)). Liver Function Tests: Recent Labs  Lab 07/29/20 1204  AST 34  ALT 8  ALKPHOS 77  BILITOT 0.7  PROT 7.6  ALBUMIN 3.5   Recent Labs  Lab 07/29/20 1204  LIPASE 36   No results for input(s): AMMONIA in the last 168 hours. Coagulation Profile: No results for input(s): INR, PROTIME in the last 168 hours. Cardiac Enzymes: No results for input(s): CKTOTAL, CKMB, CKMBINDEX, TROPONINI in the last 168 hours. BNP (last 3 results) No results for input(s): PROBNP in the last 8760 hours. HbA1C: No results for input(s): HGBA1C in the last 72 hours. CBG: Recent Labs  Lab 07/29/20 1300  GLUCAP 82   Lipid Profile: No results for input(s): CHOL, HDL, LDLCALC, TRIG, CHOLHDL, LDLDIRECT in the last 72  hours. Thyroid Function Tests: No results for input(s): TSH, T4TOTAL, FREET4, T3FREE, THYROIDAB in the last 72 hours. Anemia Panel: Recent Labs    07/30/20 0513  FERRITIN 7*  TIBC 417  IRON 26*   Sepsis Labs: No results for input(s): PROCALCITON, LATICACIDVEN in the last 168 hours.  Recent Results (from the past 240 hour(s))  SARS CORONAVIRUS 2 (TAT 6-24 HRS) Nasopharyngeal Nasopharyngeal Swab     Status: None   Collection Time: 07/29/20  5:48 PM   Specimen: Nasopharyngeal Swab  Result Value Ref Range Status  SARS Coronavirus 2 NEGATIVE NEGATIVE Final    Comment: (NOTE) SARS-CoV-2 target nucleic acids are NOT DETECTED.  The SARS-CoV-2 RNA is generally detectable in upper and lower respiratory specimens during the acute phase of infection. Negative results do not preclude SARS-CoV-2 infection, do not rule out co-infections with other pathogens, and should not be used as the sole basis for treatment or other patient management decisions. Negative results must be combined with clinical observations, patient history, and epidemiological information. The expected result is Negative.  Fact Sheet for Patients: SugarRoll.be  Fact Sheet for Healthcare Providers: https://www.woods-mathews.com/  This test is not yet approved or cleared by the [place] FDA and  has been authorized for detection and/or diagnosis of SARS-CoV-2 by FDA under an Emergency Use Authorization (EUA). This EUA will remain  in effect (meaning this test can be used) for the duration of the COVID-19 declaration under Se ction 564(b)(1) of the Act, 21 U.S.C. section 360bbb-3(b)(1), unless the authorization is terminated or revoked sooner.  Performed at [facility], [place] [address], [place], [place] [address]   MRSA PCR Screening     Status: None   Collection Time: 07/29/20  8:34 PM   Specimen: Nasal Mucosa; Nasopharyngeal  Result Value Ref Range Status    MRSA by PCR NEGATIVE NEGATIVE Final    Comment:        The GeneXpert MRSA Assay (FDA approved for NASAL specimens only), is one component of a comprehensive MRSA colonization surveillance program. It is not intended to diagnose MRSA infection nor to guide or monitor treatment for MRSA infections. Performed at [facility], [place] [address], [place], [place] [address]          Radiology Studies: DG Chest 2 View  Result Date: 07/29/2020 CLINICAL DATA:  Chest pain and hypertension EXAM: CHEST - 2 VIEW COMPARISON:  February 09, 2015 FINDINGS: Lungs are clear. Heart is mildly enlarged with pulmonary vascularity normal. No adenopathy. There is aortic atherosclerosis. No bone lesions. IMPRESSION: Mild cardiomegaly. Lungs clear. Aortic Atherosclerosis (ICD10-I70.0). Electronically Signed   By: Lowella Grip III M.D.   On: 07/29/2020 12:56   DG Ribs Unilateral Left  Result Date: 07/30/2020 CLINICAL DATA:  Anterior rib pain. EXAM: LEFT RIBS - 2 VIEW COMPARISON:  Chest x-ray 07/29/2020 and 02/09/2015 FINDINGS: No fracture or other bone lesions are seen involving the ribs. IMPRESSION: Negative. Electronically Signed   By: Marin Olp M.D.   On: 07/30/2020 10:29   CT Head Wo Contrast  Result Date: 07/29/2020 CLINICAL DATA:  Left facial numbness. EXAM: CT HEAD WITHOUT CONTRAST TECHNIQUE: Contiguous axial images were obtained from the base of the skull through the vertex without intravenous contrast. COMPARISON:  None. FINDINGS: Brain: Mild diffuse cortical atrophy is noted. Mild chronic ischemic white matter disease is noted. No mass effect or midline shift is noted. Ventricular size is within normal limits. There is no evidence of mass lesion, hemorrhage or acute infarction. Vascular: No hyperdense vessel or unexpected calcification. Skull: Normal. Negative for fracture or focal lesion. Sinuses/Orbits: No acute finding. Other: None. IMPRESSION: Mild diffuse cortical atrophy. Mild chronic  ischemic white matter disease. No acute intracranial abnormality seen. Electronically Signed   By: Marijo Conception M.D.   On: 07/29/2020 14:51   MR ABDOMEN W WO CONTRAST  Result Date: 07/30/2020 CLINICAL DATA:  Indeterminate soft tissue stranding in the porta hepatis on prior CT. Question neoplasm. Indeterminate left renal lesion. EXAM: MRI ABDOMEN WITHOUT AND WITH CONTRAST TECHNIQUE: Multiplanar multisequence  MR imaging of the abdomen was performed both before and after the administration of intravenous contrast. CONTRAST:  7.53m GADAVIST GADOBUTROL 1 MMOL/ML IV SOLN COMPARISON:  Radiograph 12/06/2013. Abdominal CTA 07/29/2020 and chest CT 08/07/2011. FINDINGS: Despite efforts by the technologist and patient, mild motion artifact is present on today's exam and could not be eliminated. This reduces exam sensitivity and specificity. Lower chest: Right retrocrural cystic lesion measuring up to 4.1 cm on image 17/4 is stable from the remote CT, consistent with a benign finding. Probable left ventricular hypertrophy. The visualized lower chest otherwise appears unremarkable. Hepatobiliary: There is diffusely decreased signal throughout the liver on T2 weighted images, and loss of signal on the in phase images consistent with hemosiderosis. There are scattered hepatic cysts. No suspicious liver lesions. Multiple gallstones are again noted. No evidence of gallbladder wall thickening or biliary dilatation. Pancreas: Unremarkable. No pancreatic ductal dilatation or surrounding inflammatory changes. Spleen: Diffusely decreased T2 signal consistent with hemosiderosis. No focal abnormality or splenomegaly. Adrenals/Urinary Tract: Both adrenal glands appear normal. Bilateral renal cysts are noted, largest in the upper pole of the left kidney, measuring 3.2 cm in diameter. The indeterminate lesion medially in the upper pole of the left kidney is suboptimally evaluated by this examination due to motion artifact. However,  there is a hypoenhancing lesion in this area which measures 1.1 cm, best seen on image 35/20. In addition, there is an 8 mm lesion anteriorly in the interpolar region of the right kidney (image 38/20) which also demonstrates low-level enhancement and low T2 signal. No hydronephrosis. Stomach/Bowel: Moderate ingested material in the stomach. No evidence bowel wall thickening, distention or surrounding inflammation. Vascular/Lymphatic: Aortic and branch vessel atherosclerosis. No evidence of aneurysm or large vessel occlusion. The portal, superior mesenteric and splenic veins appear patent. No enlarged abdominal lymph nodes are seen. Other: No mass, inflammation or abnormal enhancement is seen within the porta hepatis. Evaluation is somewhat limited by the breathing artifact, although no significant abnormality is suspected. Musculoskeletal: No acute or significant osseous findings. Multilevel spondylosis. 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 IMPRESSION: 1. No evidence of mass in the porta hepatis. The pancreas appears normal, and there is no biliary or pancreatic ductal dilatation. 2. Small hypoenhancing lesions medially in the upper pole of the left kidney and anteriorly in the interpolar region of the right kidney, suspicious for small neoplasms. These lesions are incompletely evaluated by this examination due to motion artifact. Clinical significance is doubtful given the patient's age, and dedicated renal MR or CT (pre and post contrast) follow-up in 6-12 months recommended. 3. Cholelithiasis without evidence of biliary dilatation. 4. Hemosiderosis. Electronically Signed   By: WRichardean SaleM.D.   On: 07/30/2020 16:36   ECHOCARDIOGRAM COMPLETE  Result Date: 07/30/2020    ECHOCARDIOGRAM REPORT   Patient Name:   Isaac ELLENDate of Exam: 07/30/2020 Medical Rec #:  [id number]    Height:       71.0 in Accession #:    [id number]   Weight:       169.8 lb  Date of Birth:  815-Mar-1932    BSA:          1.967 m Patient Age:    89years      BP:           145/62 mmHg Patient Gender: M             HR:  72 bpm. Exam Location:  Inpatient Procedure: 2D Echo, Color Doppler, Cardiac Doppler, Strain Analysis and 3D Echo Indications:    Chest Pain R07.9  History:        Patient has no prior history of Echocardiogram examinations.                 Risk Factors:Hypertension, Dyslipidemia and Current Smoker.                 Chronic kidney disease. GERD. Anemia. Left kidney lesion.  Sonographer:    Darlina Sicilian RDCS Referring Phys: [id number] [place]  1. Left ventricular ejection fraction, by estimation, is 60 to 65%. The left ventricle has normal function. The left ventricle has no regional wall motion abnormalities. There is moderate concentric left ventricular hypertrophy. Left ventricular diastolic parameters are consistent with Grade II diastolic dysfunction (pseudonormalization). The global longitudinal strain is normal.  2. Right ventricular systolic function is normal. The right ventricular size is normal. Mildly increased right ventricular wall thickness.  3. Left atrial size was moderately dilated.  4. The mitral valve is grossly normal. Trivial mitral valve regurgitation.  5. The aortic valve is tricuspid. There is mild calcification of the aortic valve. Aortic valve regurgitation is not visualized. No aortic stenosis is present.  6. There is borderline dilatation of the ascending aorta, measuring 37 mm.  7. The inferior vena cava is normal in size with greater than 50% respiratory variability, suggesting right atrial pressure of 3 mmHg. Comparison(s): No prior Echocardiogram. FINDINGS  Left Ventricle: No systolic anterior motion of the mitral valve. Left ventricular ejection fraction, by estimation, is 60 to 65%. The left ventricle has normal function. The left ventricle has no regional wall motion abnormalities. The global longitudinal strain is  normal. The left ventricular internal cavity size was normal in size. There is moderate concentric left ventricular hypertrophy. Left ventricular diastolic parameters are consistent with Grade II diastolic dysfunction (pseudonormalization). Right Ventricle: The right ventricular size is normal. Mildly increased right ventricular wall thickness. Right ventricular systolic function is normal. Left Atrium: Left atrial size was moderately dilated. Right Atrium: Right atrial size was normal in size. Pericardium: There is no evidence of pericardial effusion. Mitral Valve: The mitral valve is grossly normal. Trivial mitral valve regurgitation. Tricuspid Valve: The tricuspid valve is normal in structure. Tricuspid valve regurgitation is not demonstrated. No evidence of tricuspid stenosis. Aortic Valve: The aortic valve is tricuspid. There is mild calcification of the aortic valve. There is mild aortic valve annular calcification. Aortic valve regurgitation is not visualized. No aortic stenosis is present. Pulmonic Valve: The pulmonic valve was grossly normal. Pulmonic valve regurgitation is trivial. No evidence of pulmonic stenosis. Aorta: ASHI 2.05 cm/m (low risk). The aortic root is normal in size and structure. There is borderline dilatation of the ascending aorta, measuring 37 mm. Venous: The inferior vena cava is normal in size with greater than 50% respiratory variability, suggesting right atrial pressure of 3 mmHg. IAS/Shunts: The atrial septum is grossly normal.  LEFT VENTRICLE PLAX 2D LVIDd:         4.60 cm  Diastology LVIDs:         3.00 cm  LV e' medial:    5.44 cm/s LV PW:         1.50 cm  LV E/e' medial:  14.1 LV IVS:        1.40 cm  LV e' lateral:   5.98 cm/s LVOT diam:     2.10 cm  LV E/e' lateral: 12.8 LV SV:         83 LV SV Index:   42       2D Longitudinal Strain LVOT Area:     3.46 cm 2D Strain GLS (A4C):   -23.0 %  RIGHT VENTRICLE RV S prime:     20.20 cm/s TAPSE (M-mode): 2.8 cm LEFT ATRIUM              Index       RIGHT ATRIUM           Index LA diam:        3.80 cm 1.93 cm/m  RA Area:     17.70 cm LA Vol (A2C):   84.7 ml 43.07 ml/m RA Volume:   45.10 ml  22.93 ml/m LA Vol (A4C):   60.7 ml 30.87 ml/m LA Biplane Vol: 90.6 ml 46.07 ml/m  AORTIC VALVE LVOT Vmax:   124.00 cm/s LVOT Vmean:  80.900 cm/s LVOT VTI:    0.241 m  AORTA Ao Root diam: 3.30 cm Ao Asc diam:  3.70 cm MITRAL VALVE MV Area (PHT): 2.87 cm    SHUNTS MV Decel Time: 264 msec    Systemic VTI:  0.24 m MV E velocity: 76.70 cm/s  Systemic Diam: 2.10 cm MV A velocity: 83.10 cm/s MV E/A ratio:  0.92 Rudean Haskell MD Electronically signed by Rudean Haskell MD Signature Date/Time: 07/30/2020/12:48:22 PM    Final    CT Angio Chest/Abd/Pel for Dissection W and/or Wo Contrast  Result Date: 07/29/2020 CLINICAL DATA:  Abdominal pain. Evaluate for abdominal aortic dissection. EXAM: CT ANGIOGRAPHY CHEST, ABDOMEN AND PELVIS TECHNIQUE: Non-contrast CT of the chest was initially obtained. Multidetector CT imaging through the chest, abdomen and pelvis was performed using the standard protocol during bolus administration of intravenous contrast. Multiplanar reconstructed images and MIPs were obtained and reviewed to evaluate the vascular anatomy. CONTRAST:  141m OMNIPAQUE IOHEXOL 350 MG/ML SOLN COMPARISON:  CT chest 08/07/2011 FINDINGS: CTA CHEST FINDINGS Cardiovascular: Preferential opacification of the thoracic aorta. No evidence of thoracic aortic aneurysm. Mild dilatation of the transverse aortic arch measures 3.2 cm. Aberrant right subclavian artery. Cardiac enlargement. No pericardial effusion. Mediastinum/Nodes: No enlarged mediastinal, hilar, or axillary lymph nodes. Thyroid gland, trachea, and esophagus demonstrate no significant findings. Similar appearance of fluid attenuating structure within the posterior mediastinum at the level of the hiatus. This measures 2.7 x 2.5 cm, image 81/7. Previously this measured 2.5 x 2.2 cm.  Lungs/Pleura: No pleural effusion. No airspace consolidation. There is a part solid nodule within the superior segment of left lower lobe measuring 2.2 by 1.6 cm with central solid component measuring 6 mm, image 34/9. Previously this was entirely non solid measuring 1.8 x 1.4 cm. Musculoskeletal: No acute or suspicious osseous abnormality. Spondylosis noted within the thoracic spine. Review of the MIP images confirms the above findings. CTA ABDOMEN AND PELVIS FINDINGS VASCULAR Aorta: Normal caliber aorta without aneurysm, dissection, vasculitis or significant stenosis. Aortic atherosclerosis. Celiac: Patent without evidence of aneurysm, dissection, vasculitis or significant stenosis. SMA: Patent without evidence of aneurysm, dissection, vasculitis or significant stenosis. Renals: Both renal arteries are patent without evidence of aneurysm, dissection, vasculitis, fibromuscular dysplasia or significant stenosis. IMA: Patent without evidence of aneurysm, dissection, vasculitis or significant stenosis. Inflow: Patent without evidence of aneurysm, dissection, vasculitis or significant stenosis. Veins: No obvious venous abnormality within the limitations of this arterial phase study. Review of the MIP images confirms the above findings. NON-VASCULAR Hepatobiliary: Several liver cysts are  again noted and appears similar to previous exam. No suspicious liver abnormality. Multiple stones identified within the gallbladder which measures up to 9 mm. No gallbladder wall thickening or pericholecystic fluid. No bile duct dilatation identified. Pancreas: Unremarkable. No pancreatic ductal dilatation or surrounding inflammatory changes. Spleen: Normal in size without focal abnormality. Adrenals/Urinary Tract: Left kidney cysts are noted. The largest arises from the upper pole measuring 3.2 cm. Additionally there are several, less than 1 cm, too small to reliably characterize renal hypodensities. There is a subtle, slightly  exophytic soft tissue attenuating lesion arising off the medial cortex of the upper pole of left kidney. This measures 1.2 cm with mean Hounsfield units of 87, image 87/12 and image 101/7. No hydronephrosis identified bilaterally. Urinary bladder is unremarkable. Stomach/Bowel: The stomach is nondistended. The appendix is not confidently identified separate from the right lower quadrant bowel loops. No bowel wall thickening, inflammation, or distension. Lymphatic: There is mild hazy soft tissue within the porta hepatic region surrounding the replaced right hepatic artery measuring approximately 2.2 x 1.9 cm, image 103/7. No retroperitoneal, mesenteric, or pelvic adenopathy. Reproductive: Prostate gland is enlarged and has mass effect upon the base of bladder. Other: Left inguinal hernia contains nonobstructed loop of large bowel. No free fluid or fluid collections identified. 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 Well-defined, nonaggressive appearing sclerotic lesion within L5 is favored to represent a large bone island. This measures approximately 1.5 cm. No acute or suspicious osseous findings. Review of the MIP images confirms the above findings. IMPRESSION: 1. No evidence for aortic dissection or aneurysm. 2. There is mild hazy soft tissue within the porta hepatic region which encases the replaced right hepatic artery. Etiology is indeterminate. Differential considerations include confluent inflammation or infiltrative neoplasm. In the absence of signs or symptoms of inflammation/infection further investigation with abdominal MRI without and with contrast material is recommended. 3. There is a subtle, slightly exophytic soft tissue attenuating lesion arising off the medial cortex of the upper pole of left kidney. This is indeterminate and may represent a hemorrhagic/proteinaceous cyst versus solid enhancing neoplasm. Further evaluation with contrast enhanced abdominal  MRI is recommended. 4. There is a part solid nodule within the superior segment of left lower lobe measuring 2.2 cm with central solid component measuring 6 mm. Previously this was entirely non solid measuring 1.8 x 1.4 cm. This is considered highly suspicious for low-grade indolent pulmonary neoplasm such as adenocarcinoma. This recommendation follows the consensus statement: Guidelines for Management of Incidental Pulmonary Nodules Detected on CT Images: From the Fleischner Society 2017; Radiology 2017; 284:228-243. 5. Aberrant right subclavian artery. 6. Gallstones. 7. Left inguinal hernia contains nonobstructed loop of large bowel. 8. Enlarged prostate gland with mass effect upon the base of bladder. 9. 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 10. Aortic atherosclerosis. Aortic Atherosclerosis (ICD10-I70.0). Electronically Signed   By: Kerby Moors M.D.   On: 07/29/2020 15:17        Scheduled Meds: . allopurinol  200 mg Oral Daily  . amLODipine  10 mg Oral Daily  . carvedilol  3.125 mg Oral BID WC  . hydrALAZINE  75 mg Oral Q8H  . pantoprazole  40 mg Oral BID  . peg 3350 powder  0.5 kit Oral Once   And  . peg 3350 powder  0.5 kit Oral Once  . simvastatin  20 mg Oral Daily   Continuous Infusions:   LOS: 1 day    Time spent: 35 minutes  Elmarie Shiley, MD [facility]   If 7PM-7AM, please contact night-coverage [email or web address]  07/31/2020, 7:05 AM

## 2020-07-31 NOTE — Progress Notes (Addendum)
Attending physician's note   I have taken an interval history, reviewed the chart and examined the patient. I agree with the Advanced Practitioner's note, impression, and recommendations as outlined.   No acute events overnight.  No complaints today.  MRI completed yesterday as outlined below, demonstrating hemosiderosis, hepatic cyst, cholelithiasis and possible small left renal neoplasm.  Porta hepatis otherwise appeared okay.  Was given IV iron yesterday.  TTE yesterday with 60-65% EF, LVH.  H/H 7.8/26 today.  Largely stable from previous.  1) Iron deficiency anemia 2) FOBT positive stool 3) History of chronic NSAID use 4) Melena -Continue serial H/H checks -Clears today with bowel prep this evening -EGD and colonoscopy tomorrow with Dr. 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  Can defer to primary hospitalist service inpatient versus outpatient work-up  6) Hypertensive emergency -BP much improved.  Should be stable for procedures tomorrow  Cephas Darby, [place] [phone] office          Patient ID: SEARCY MIYOSHI, male   DOB: 12-Oct-1930, 87 y.o.   MRN: [id number]    Progress Note   Subjective   Day # 2  CC; weakness/ CP/ Rib pain  HGB 7.8 stable BUN 25/ Creat 2.05  MRI abdomen-cholelithiasis, hemosiderosis, no mass in the porta hepatis, pancreas appears normal no biliary ductal dilation, small hypoenhancing lesions medially in the upper pole of the left kidney in the interpolar region of the right kidney suspicious for small neoplasms recommend follow-up MRI in 6 months   Patient says he feels okay, no dark stools or melena, no complaints of abdominal pain.  Feels fatigued.  Relates that he has had some numbness on the left side of his lip and face over the past several days.   Objective   Vital signs in last 24 hours: Temp:  [97.6 F (36.4 C)-98.4 F (36.9 C)] 98.2 F (36.8 C) (02/27  0735) Pulse Rate:  [59-69] 69 (02/27 0833) Resp:  [18-24] 19 (02/27 0735) BP: (131-184)/(53-92) 138/57 (02/27 0833) SpO2:  [95 %-100 %] 95 % (02/27 0735) Last BM Date: 07/30/20 General:   AA male in NAD Heart:  Regular rate and rhythm; no murmurs Lungs: Respirations even and unlabored, lungs CTA bilaterally Abdomen:  Soft, nontender and nondistended. Normal bowel sounds. Extremities:  Without edema. Neurologic:  Alert and oriented,  grossly normal neurologically. Psych:  Cooperative. Normal mood and affect.  Intake/Output from previous day: 02/26 0701 - 02/27 0700 In: 440 [P.O.:440] Out: 775 [Urine:775] Intake/Output this shift: Total I/O In: 240 [P.O.:240] Out: 100 [Urine:100]  Lab Results: Recent Labs    07/29/20 1204 07/29/20 1248 07/30/20 0513 07/30/20 1338 07/30/20 1857 07/31/20 0127  WBC 6.1  --  6.3  --   --   --   HGB 8.3*   < > 7.8* 8.7* 7.5* 7.8*  HCT 27.8*   < > 25.5* 29.8* 25.3* 26.2*  PLT 235  --  248  --   --   --    < > = values in this interval not displayed.   BMET Recent Labs    07/29/20 1204 07/29/20 1248 07/30/20 0513 07/31/20 0127  NA 139 141 138 137  K 4.5 4.5 3.9 4.1  CL 107 108 105 103  CO2 21*  --  24 23  GLUCOSE 97 93 104* 111*  BUN 20 29* 21 25*  CREATININE 1.77* 1.80* 1.78* 2.05*  CALCIUM 8.9  --  9.0  9.2   LFT Recent Labs    07/29/20 1204  PROT 7.6  ALBUMIN 3.5  AST 34  ALT 8  ALKPHOS 77  BILITOT 0.7   PT/INR No results for input(s): LABPROT, INR in the last 72 hours.  Studies/Results: DG Chest 2 View  Result Date: 07/29/2020 CLINICAL DATA:  Chest pain and hypertension EXAM: CHEST - 2 VIEW COMPARISON:  February 09, 2015 FINDINGS: Lungs are clear. Heart is mildly enlarged with pulmonary vascularity normal. No adenopathy. There is aortic atherosclerosis. No bone lesions. IMPRESSION: Mild cardiomegaly. Lungs clear. Aortic Atherosclerosis (ICD10-I70.0). Electronically Signed   By: Lowella Grip III M.D.   On:  07/29/2020 12:56   DG Ribs Unilateral Left  Result Date: 07/30/2020 CLINICAL DATA:  Anterior rib pain. EXAM: LEFT RIBS - 2 VIEW COMPARISON:  Chest x-ray 07/29/2020 and 02/09/2015 FINDINGS: No fracture or other bone lesions are seen involving the ribs. IMPRESSION: Negative. Electronically Signed   By: Marin Olp M.D.   On: 07/30/2020 10:29   CT Head Wo Contrast  Result Date: 07/29/2020 CLINICAL DATA:  Left facial numbness. EXAM: CT HEAD WITHOUT CONTRAST TECHNIQUE: Contiguous axial images were obtained from the base of the skull through the vertex without intravenous contrast. COMPARISON:  None. FINDINGS: Brain: Mild diffuse cortical atrophy is noted. Mild chronic ischemic white matter disease is noted. No mass effect or midline shift is noted. Ventricular size is within normal limits. There is no evidence of mass lesion, hemorrhage or acute infarction. Vascular: No hyperdense vessel or unexpected calcification. Skull: Normal. Negative for fracture or focal lesion. Sinuses/Orbits: No acute finding. Other: None. IMPRESSION: Mild diffuse cortical atrophy. Mild chronic ischemic white matter disease. No acute intracranial abnormality seen. Electronically Signed   By: Marijo Conception M.D.   On: 07/29/2020 14:51   MR ABDOMEN W WO CONTRAST  Result Date: 07/30/2020 CLINICAL DATA:  Indeterminate soft tissue stranding in the porta hepatis on prior CT. Question neoplasm. Indeterminate left renal lesion. EXAM: MRI ABDOMEN WITHOUT AND WITH CONTRAST TECHNIQUE: Multiplanar multisequence MR imaging of the abdomen was performed both before and after the administration of intravenous contrast. CONTRAST:  7.7mL GADAVIST GADOBUTROL 1 MMOL/ML IV SOLN COMPARISON:  Radiograph 12/06/2013. Abdominal CTA 07/29/2020 and chest CT 08/07/2011. FINDINGS: Despite efforts by the technologist and patient, mild motion artifact is present on today's exam and could not be eliminated. This reduces exam sensitivity and specificity. Lower  chest: Right retrocrural cystic lesion measuring up to 4.1 cm on image 17/4 is stable from the remote CT, consistent with a benign finding. Probable left ventricular hypertrophy. The visualized lower chest otherwise appears unremarkable. Hepatobiliary: There is diffusely decreased signal throughout the liver on T2 weighted images, and loss of signal on the in phase images consistent with hemosiderosis. There are scattered hepatic cysts. No suspicious liver lesions. Multiple gallstones are again noted. No evidence of gallbladder wall thickening or biliary dilatation. Pancreas: Unremarkable. No pancreatic ductal dilatation or surrounding inflammatory changes. Spleen: Diffusely decreased T2 signal consistent with hemosiderosis. No focal abnormality or splenomegaly. Adrenals/Urinary Tract: Both adrenal glands appear normal. Bilateral renal cysts are noted, largest in the upper pole of the left kidney, measuring 3.2 cm in diameter. The indeterminate lesion medially in the upper pole of the left kidney is suboptimally evaluated by this examination due to motion artifact. However, there is a hypoenhancing lesion in this area which measures 1.1 cm, best seen on image 35/20. In addition, there is an 8 mm lesion anteriorly in the interpolar region of the  right kidney (image 38/20) which also demonstrates low-level enhancement and low T2 signal. No hydronephrosis. Stomach/Bowel: Moderate ingested material in the stomach. No evidence bowel wall thickening, distention or surrounding inflammation. Vascular/Lymphatic: Aortic and branch vessel atherosclerosis. No evidence of aneurysm or large vessel occlusion. The portal, superior mesenteric and splenic veins appear patent. No enlarged abdominal lymph nodes are seen. Other: No mass, inflammation or abnormal enhancement is seen within the porta hepatis. Evaluation is somewhat limited by the breathing artifact, although no significant abnormality is suspected. Musculoskeletal: No  acute or significant osseous findings. Multilevel spondylosis. 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 IMPRESSION: 1. No evidence of mass in the porta hepatis. The pancreas appears normal, and there is no biliary or pancreatic ductal dilatation. 2. Small hypoenhancing lesions medially in the upper pole of the left kidney and anteriorly in the interpolar region of the right kidney, suspicious for small neoplasms. These lesions are incompletely evaluated by this examination due to motion artifact. Clinical significance is doubtful given the patient's age, and dedicated renal MR or CT (pre and post contrast) follow-up in 6-12 months recommended. 3. Cholelithiasis without evidence of biliary dilatation. 4. Hemosiderosis. Electronically Signed   By: Richardean Sale M.D.   On: 07/30/2020 16:36   ECHOCARDIOGRAM COMPLETE  Result Date: 07/30/2020    ECHOCARDIOGRAM REPORT   Patient Name:   CHEY CHO Date of Exam: 07/30/2020 Medical Rec #:  [id number]     Height:       71.0 in Accession #:    [id number]    Weight:       169.8 lb Date of Birth:  01/10/1931     BSA:          1.967 m Patient Age:    33 years      BP:           145/62 mmHg Patient Gender: M             HR:           72 bpm. Exam Location:  Inpatient Procedure: 2D Echo, Color Doppler, Cardiac Doppler, Strain Analysis and 3D Echo Indications:    Chest Pain R07.9  History:        Patient has no prior history of Echocardiogram examinations.                 Risk Factors:Hypertension, Dyslipidemia and Current Smoker.                 Chronic kidney disease. GERD. Anemia. Left kidney lesion.  Sonographer:    Darlina Sicilian RDCS Referring Phys: [id number] Niland  1. Left ventricular ejection fraction, by estimation, is 60 to 65%. The left ventricle has normal function. The left ventricle has no regional wall motion abnormalities. There is moderate concentric left ventricular hypertrophy. Left  ventricular diastolic parameters are consistent with Grade II diastolic dysfunction (pseudonormalization). The global longitudinal strain is normal.  2. Right ventricular systolic function is normal. The right ventricular size is normal. Mildly increased right ventricular wall thickness.  3. Left atrial size was moderately dilated.  4. The mitral valve is grossly normal. Trivial mitral valve regurgitation.  5. The aortic valve is tricuspid. There is mild calcification of the aortic valve. Aortic valve regurgitation is not visualized. No aortic stenosis is present.  6. There is borderline dilatation of the ascending aorta, measuring 37 mm.  7. The inferior vena cava is normal in size  with greater than 50% respiratory variability, suggesting right atrial pressure of 3 mmHg. Comparison(s): No prior Echocardiogram. FINDINGS  Left Ventricle: No systolic anterior motion of the mitral valve. Left ventricular ejection fraction, by estimation, is 60 to 65%. The left ventricle has normal function. The left ventricle has no regional wall motion abnormalities. The global longitudinal strain is normal. The left ventricular internal cavity size was normal in size. There is moderate concentric left ventricular hypertrophy. Left ventricular diastolic parameters are consistent with Grade II diastolic dysfunction (pseudonormalization). Right Ventricle: The right ventricular size is normal. Mildly increased right ventricular wall thickness. Right ventricular systolic function is normal. Left Atrium: Left atrial size was moderately dilated. Right Atrium: Right atrial size was normal in size. Pericardium: There is no evidence of pericardial effusion. Mitral Valve: The mitral valve is grossly normal. Trivial mitral valve regurgitation. Tricuspid Valve: The tricuspid valve is normal in structure. Tricuspid valve regurgitation is not demonstrated. No evidence of tricuspid stenosis. Aortic Valve: The aortic valve is tricuspid. There is mild  calcification of the aortic valve. There is mild aortic valve annular calcification. Aortic valve regurgitation is not visualized. No aortic stenosis is present. Pulmonic Valve: The pulmonic valve was grossly normal. Pulmonic valve regurgitation is trivial. No evidence of pulmonic stenosis. Aorta: ASHI 2.05 cm/m (low risk). The aortic root is normal in size and structure. There is borderline dilatation of the ascending aorta, measuring 37 mm. Venous: The inferior vena cava is normal in size with greater than 50% respiratory variability, suggesting right atrial pressure of 3 mmHg. IAS/Shunts: The atrial septum is grossly normal.  LEFT VENTRICLE PLAX 2D LVIDd:         4.60 cm  Diastology LVIDs:         3.00 cm  LV e' medial:    5.44 cm/s LV PW:         1.50 cm  LV E/e' medial:  14.1 LV IVS:        1.40 cm  LV e' lateral:   5.98 cm/s LVOT diam:     2.10 cm  LV E/e' lateral: 12.8 LV SV:         83 LV SV Index:   42       2D Longitudinal Strain LVOT Area:     3.46 cm 2D Strain GLS (A4C):   -23.0 %  RIGHT VENTRICLE RV S prime:     20.20 cm/s TAPSE (M-mode): 2.8 cm LEFT ATRIUM             Index       RIGHT ATRIUM           Index LA diam:        3.80 cm 1.93 cm/m  RA Area:     17.70 cm LA Vol (A2C):   84.7 ml 43.07 ml/m RA Volume:   45.10 ml  22.93 ml/m LA Vol (A4C):   60.7 ml 30.87 ml/m LA Biplane Vol: 90.6 ml 46.07 ml/m  AORTIC VALVE LVOT Vmax:   124.00 cm/s LVOT Vmean:  80.900 cm/s LVOT VTI:    0.241 m  AORTA Ao Root diam: 3.30 cm Ao Asc diam:  3.70 cm MITRAL VALVE MV Area (PHT): 2.87 cm    SHUNTS MV Decel Time: 264 msec    Systemic VTI:  0.24 m MV E velocity: 76.70 cm/s  Systemic Diam: 2.10 cm MV A velocity: 83.10 cm/s MV E/A ratio:  0.92 Rudean Haskell MD Electronically signed by Rudean Haskell MD Signature Date/Time: 07/30/2020/12:48:22 PM  Final    CT Angio Chest/Abd/Pel for Dissection W and/or Wo Contrast  Result Date: 07/29/2020 CLINICAL DATA:  Abdominal pain. Evaluate for abdominal aortic  dissection. EXAM: CT ANGIOGRAPHY CHEST, ABDOMEN AND PELVIS TECHNIQUE: Non-contrast CT of the chest was initially obtained. Multidetector CT imaging through the chest, abdomen and pelvis was performed using the standard protocol during bolus administration of intravenous contrast. Multiplanar reconstructed images and MIPs were obtained and reviewed to evaluate the vascular anatomy. CONTRAST:  171mL OMNIPAQUE IOHEXOL 350 MG/ML SOLN COMPARISON:  CT chest 08/07/2011 FINDINGS: CTA CHEST FINDINGS Cardiovascular: Preferential opacification of the thoracic aorta. No evidence of thoracic aortic aneurysm. Mild dilatation of the transverse aortic arch measures 3.2 cm. Aberrant right subclavian artery. Cardiac enlargement. No pericardial effusion. Mediastinum/Nodes: No enlarged mediastinal, hilar, or axillary lymph nodes. Thyroid gland, trachea, and esophagus demonstrate no significant findings. Similar appearance of fluid attenuating structure within the posterior mediastinum at the level of the hiatus. This measures 2.7 x 2.5 cm, image 81/7. Previously this measured 2.5 x 2.2 cm. Lungs/Pleura: No pleural effusion. No airspace consolidation. There is a part solid nodule within the superior segment of left lower lobe measuring 2.2 by 1.6 cm with central solid component measuring 6 mm, image 34/9. Previously this was entirely non solid measuring 1.8 x 1.4 cm. Musculoskeletal: No acute or suspicious osseous abnormality. Spondylosis noted within the thoracic spine. Review of the MIP images confirms the above findings. CTA ABDOMEN AND PELVIS FINDINGS VASCULAR Aorta: Normal caliber aorta without aneurysm, dissection, vasculitis or significant stenosis. Aortic atherosclerosis. Celiac: Patent without evidence of aneurysm, dissection, vasculitis or significant stenosis. SMA: Patent without evidence of aneurysm, dissection, vasculitis or significant stenosis. Renals: Both renal arteries are patent without evidence of aneurysm,  dissection, vasculitis, fibromuscular dysplasia or significant stenosis. IMA: Patent without evidence of aneurysm, dissection, vasculitis or significant stenosis. Inflow: Patent without evidence of aneurysm, dissection, vasculitis or significant stenosis. Veins: No obvious venous abnormality within the limitations of this arterial phase study. Review of the MIP images confirms the above findings. NON-VASCULAR Hepatobiliary: Several liver cysts are again noted and appears similar to previous exam. No suspicious liver abnormality. Multiple stones identified within the gallbladder which measures up to 9 mm. No gallbladder wall thickening or pericholecystic fluid. No bile duct dilatation identified. Pancreas: Unremarkable. No pancreatic ductal dilatation or surrounding inflammatory changes. Spleen: Normal in size without focal abnormality. Adrenals/Urinary Tract: Left kidney cysts are noted. The largest arises from the upper pole measuring 3.2 cm. Additionally there are several, less than 1 cm, too small to reliably characterize renal hypodensities. There is a subtle, slightly exophytic soft tissue attenuating lesion arising off the medial cortex of the upper pole of left kidney. This measures 1.2 cm with mean Hounsfield units of 87, image 87/12 and image 101/7. No hydronephrosis identified bilaterally. Urinary bladder is unremarkable. Stomach/Bowel: The stomach is nondistended. The appendix is not confidently identified separate from the right lower quadrant bowel loops. No bowel wall thickening, inflammation, or distension. Lymphatic: There is mild hazy soft tissue within the porta hepatic region surrounding the replaced right hepatic artery measuring approximately 2.2 x 1.9 cm, image 103/7. No retroperitoneal, mesenteric, or pelvic adenopathy. Reproductive: Prostate gland is enlarged and has mass effect upon the base of bladder. Other: Left inguinal hernia contains nonobstructed loop of large bowel. No free fluid or  fluid collections identified. 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 Well-defined, nonaggressive appearing sclerotic lesion within L5 is favored to represent a large bone island. This  measures approximately 1.5 cm. No acute or suspicious osseous findings. Review of the MIP images confirms the above findings. IMPRESSION: 1. No evidence for aortic dissection or aneurysm. 2. There is mild hazy soft tissue within the porta hepatic region which encases the replaced right hepatic artery. Etiology is indeterminate. Differential considerations include confluent inflammation or infiltrative neoplasm. In the absence of signs or symptoms of inflammation/infection further investigation with abdominal MRI without and with contrast material is recommended. 3. There is a subtle, slightly exophytic soft tissue attenuating lesion arising off the medial cortex of the upper pole of left kidney. This is indeterminate and may represent a hemorrhagic/proteinaceous cyst versus solid enhancing neoplasm. Further evaluation with contrast enhanced abdominal MRI is recommended. 4. There is a part solid nodule within the superior segment of left lower lobe measuring 2.2 cm with central solid component measuring 6 mm. Previously this was entirely non solid measuring 1.8 x 1.4 cm. This is considered highly suspicious for low-grade indolent pulmonary neoplasm such as adenocarcinoma. This recommendation follows the consensus statement: Guidelines for Management of Incidental Pulmonary Nodules Detected on CT Images: From the Fleischner Society 2017; Radiology 2017; 284:228-243. 5. Aberrant right subclavian artery. 6. Gallstones. 7. Left inguinal hernia contains nonobstructed loop of large bowel. 8. Enlarged prostate gland with mass effect upon the base of bladder. 9. 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 10. Aortic atherosclerosis. Aortic Atherosclerosis (ICD10-I70.0). Electronically Signed    By: Kerby Moors M.D.   On: 07/29/2020 15:17       Assessment / Plan:    #32 87 year old African-American male with new finding of iron deficiency anemia presenting with hemoglobin in the 7 range, and Hemoccult positive stool. Hemoglobin has been stable since admission, Patient received IV Feraheme yesterday. Patient related having melenic stools for a couple of weeks in January.  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  BPH 6.  Cholelithiasis  Plan; continue p.o. twice daily PPI  Continue to trend hemoglobin, transfuse if hemoglobin drifts any lower  Patient is scheduled for EGD and colonoscopy tomorrow with Dr. Carlean Purl, bowel prep later today. Further recommendations pending findings at endoscopic evaluation.      Active Problems:   Essential hypertension   HTN (hypertension), malignant   Iron deficiency anemia   Abnormal CT of liver     LOS: 1 day   Amy Esterwood PA-C 07/31/2020, 10:03 AM

## 2020-07-31 NOTE — Plan of Care (Signed)

## 2020-08-01 ENCOUNTER — Inpatient Hospital Stay (HOSPITAL_COMMUNITY): Payer: Medicare Other

## 2020-08-01 ENCOUNTER — Encounter (HOSPITAL_COMMUNITY)
Admission: EM | Disposition: A | Discharge status: Home / Self Care | Payer: Self-pay | Source: Home / Self Care | Attending: Internal Medicine

## 2020-08-01 DIAGNOSIS — I6340 Cerebral infarction due to embolism of unspecified cerebral artery: Secondary | ICD-10-CM | POA: Insufficient documentation

## 2020-08-01 DIAGNOSIS — I639 Cerebral infarction, unspecified: Secondary | ICD-10-CM

## 2020-08-01 LAB — HEMOGLOBIN AND HEMATOCRIT, BLOOD
HCT: 22.4 % — ABNORMAL LOW (ref 39.0–52.0)
Hemoglobin: 6.7 g/dL — CL (ref 13.0–17.0)

## 2020-08-01 LAB — CBC
HCT: 27.3 % — ABNORMAL LOW (ref 39.0–52.0)
Hemoglobin: 8.4 g/dL — ABNORMAL LOW (ref 13.0–17.0)
MCH: 22.5 pg — ABNORMAL LOW (ref 26.0–34.0)
MCHC: 30.8 g/dL (ref 30.0–36.0)
MCV: 73.2 fL — ABNORMAL LOW (ref 80.0–100.0)
Platelets: 201 10*3/uL (ref 150–400)
RBC: 3.73 MIL/uL — ABNORMAL LOW (ref 4.22–5.81)
RDW: 17.6 % — ABNORMAL HIGH (ref 11.5–15.5)
WBC: 7.3 10*3/uL (ref 4.0–10.5)
nRBC: 0 % (ref 0.0–0.2)

## 2020-08-01 LAB — PSA: Prostatic Specific Antigen: 38 ng/mL — ABNORMAL HIGH (ref 0.00–4.00)

## 2020-08-01 LAB — LIPID PANEL
Cholesterol: 187 mg/dL (ref 0–200)
HDL: 34 mg/dL — ABNORMAL LOW (ref >40)
LDL Cholesterol: 134 mg/dL — ABNORMAL HIGH (ref 0–99)
Total CHOL/HDL Ratio: 5.5 RATIO
Triglycerides: 94 mg/dL (ref <150)
VLDL: 19 mg/dL (ref 0–40)

## 2020-08-01 LAB — BASIC METABOLIC PANEL
Anion gap: 9 (ref 5–15)
BUN: 32 mg/dL — ABNORMAL HIGH (ref 8–23)
CO2: 22 mmol/L (ref 22–32)
Calcium: 8.8 mg/dL — ABNORMAL LOW (ref 8.9–10.3)
Chloride: 105 mmol/L (ref 98–111)
Creatinine, Ser: 2.34 mg/dL — ABNORMAL HIGH (ref 0.61–1.24)
GFR, Estimated: 26 mL/min — ABNORMAL LOW (ref >60)
Glucose, Bld: 100 mg/dL — ABNORMAL HIGH (ref 70–99)
Potassium: 4.1 mmol/L (ref 3.5–5.1)
Sodium: 136 mmol/L (ref 135–145)

## 2020-08-01 LAB — HEMOGLOBIN A1C
Hgb A1c MFr Bld: 5.9 % — ABNORMAL HIGH (ref 4.8–5.6)
Mean Plasma Glucose: 122.63 mg/dL

## 2020-08-01 LAB — ABO/RH: ABO/RH(D): O POS

## 2020-08-01 LAB — PREPARE RBC (CROSSMATCH)

## 2020-08-01 SURGERY — ESOPHAGOGASTRODUODENOSCOPY (EGD) WITH PROPOFOL
Anesthesia: Monitor Anesthesia Care

## 2020-08-01 MED ORDER — SIMVASTATIN 20 MG PO TABS
40.0000 mg | ORAL_TABLET | Freq: Every day | ORAL | Status: DC
  Administered 2020-08-02 – 2020-08-04 (×3): 40 mg via ORAL
  Filled 2020-08-01 (×3): qty 2

## 2020-08-01 MED ORDER — PANTOPRAZOLE SODIUM 40 MG IV SOLR
40.0000 mg | Freq: Two times a day (BID) | INTRAVENOUS | Status: DC
  Administered 2020-08-01 – 2020-08-04 (×7): 40 mg via INTRAVENOUS
  Filled 2020-08-01 (×7): qty 40

## 2020-08-01 MED ORDER — SODIUM CHLORIDE 0.9% IV SOLUTION: Freq: Once | INTRAVENOUS | Status: AC

## 2020-08-01 MED ORDER — DIPHENHYDRAMINE HCL 25 MG PO CAPS
25.0000 mg | ORAL_CAPSULE | Freq: Once | ORAL | Status: AC
  Administered 2020-08-01: 25 mg via ORAL
  Filled 2020-08-01: qty 1

## 2020-08-01 MED ORDER — ACETAMINOPHEN 325 MG PO TABS
650.0000 mg | ORAL_TABLET | Freq: Once | ORAL | Status: AC
  Administered 2020-08-01: 650 mg via ORAL
  Filled 2020-08-01: qty 2

## 2020-08-01 NOTE — Progress Notes (Signed)
Carotid duplex has been completed.   Preliminary results in CV Proc.   Abram Sander 08/01/2020 1:52 PM

## 2020-08-01 NOTE — Progress Notes (Signed)
PROGRESS NOTE    SOLAN VOSLER  IRW:[id number] DOB: 03-Nov-1930 DOA: 07/29/2020 PCP: Dorothyann Peng, NP   Brief Narrative: 87 year old with past medical history significant for hype pretension not taking medications, CKD stage IIIb, GERD, gout presented complaining of chest pain rib pain on the left and epigastric pain for 1 month.  Patient complaining of left side rib cage pain for 1 month on and off.  Is been taking Tylenol and Advil as needed for pain.  He presented to the urgent care and was found to have significantly elevated blood pressure systolic blood pressure 761/950 he was referred to the ED for evaluation.  Patient denies any headache, chest pain or shortness of breath.  The ED patient received IV labetalol.  CT angiogram negative for dissection or aneurysmal. Multiples  others incidental findings  Patient reported old left side facial numbness. MRI was obtain and reveal multiples small stroke. Neurology consulted. Not on anticoagulation due to concern for GI bleed.   Assessment & Plan:   Active Problems:   Essential hypertension   HTN (hypertension), malignant   Iron deficiency anemia   Abnormal CT of liver   Melena   Cerebral embolism with cerebral infarction  1-Hypertensive emergency: Secondary to noncompliance with medication. -He received IV labetalol -Patient was a started on amlodipine and Coreg and hydralazine.  -BP better controlled.   2-Anemia; iron deficiency, GI bleed.  Hd down to 7.9--7.8 Started PPI. Change to IV today.  Report melena.  GI consulted.  Plan for endoscopy and colonoscopy during this admission. Awaiting clearance from Neurology.  Received IV iron 2/26. Hb down to 6. No BM. Getting one unit PRBC>   3-Acute left frontal, occipital, and right parietal infarct. Late subacute early chronic corona radiata and left frontal white matter infarcts:  Left side Facial numbness; he has had this symptoms for Months. B12 low at 175. Started supplement.   MRI of the brain positive for multiples strokes,. CTA brain no large vessel oclusion.  Neurology consulted. LDL 134. Increase Zocor to 40 mg.  HbA1c 5.9  4-Mild elevation troponin, flat likely related to malignant hypertension Echo: Normal ejection fraction, diastolic dysfunction grade 2.    5-CT Angio incidentals finding:  A-Soft tissue haziness within the porta hepatic region, etiology is indeterminate.  Differential consideration include confluent inflammation or infiltrative neoplasm: -MRI negative for mass, in the porta hepatic region.   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  -He will need follow-up with pulmonologist.   C-Left Kidney lesion; needs Follow up MRI.  Exophytic soft tissue attenuation lesion arising of the medial cortex of the left upper pole of the kidney. Enlarge Prostate gland, Elevated PSA 38:  -Needs follow up with Urologist, Dr Junious Silk. Dr Roni Bread will help arrange follow up for patient. Keturah Barre- Enlarge prostate gland with mass-effect upon the base of the bladder: Need follow-up with urology. -PSA; 38.   6-CKD stage IIIb;  Prior cr per records 1.6---2.1 Difficulty voiding, plan to start Flomax. Renal function test pending.   7--Left-sided rib pain;  Dedicated rib x ray negative.    Estimated body mass index is 23.68 kg/m as calculated from the following:   Height as of this encounter: 5\' 11"  (1.803 m).   Weight as of this encounter: 77 kg.   DVT prophylaxis: SCD Code Status: Full code Family Communication: Discussed with daughter  2/27 Disposition Plan:  Status is: Observation  The patient remains  OBS appropriate and will d/c before 2 midnights.  Dispo: The patient is from: Home              Anticipated d/c is to: Home              Patient currently is not medically stable to d/c.  Needs GI evaluation, work up for stroke in process.    Difficult to place  patient No        Consultants:   GI  Procedures:     Antimicrobials:    Subjective: Denies having BM in the hospital/  Denies pain.  He has been able to urinate.   Objective: Vitals:   08/01/20 0003 08/01/20 0423 08/01/20 0630 08/01/20 0700  BP: (!) 136/48 (!) 153/67 (!) 167/96 (!) 158/62  Pulse: (!) 57 60 77 67  Resp: 20 20 18  (!) 22  Temp: 97.9 F (36.6 C) (!) 97.3 F (36.3 C) 97.6 F (36.4 C) 98.4 F (36.9 C)  TempSrc: Oral Oral Oral   SpO2: 100% 99%    Weight:      Height:        Intake/Output Summary (Last 24 hours) at 08/01/2020 0745 Last data filed at 08/01/2020 7741 Gross per 24 hour  Intake 240 ml  Output 150 ml  Net 90 ml   Filed Weights   07/29/20 2030  Weight: 77 kg    Examination:  General exam: NAD Respiratory system: CTA Cardiovascular system: S 1, S 2 RRR Gastrointestinal system: BS present, soft, nt Central nervous system: alert, follows command Extremities; no edema  Data Reviewed: I have personally reviewed following labs and imaging studies  CBC: Recent Labs  Lab 07/29/20 1204 07/29/20 1248 07/30/20 0513 07/30/20 1338 07/30/20 1857 07/31/20 0127 07/31/20 1215 08/01/20 0128  WBC 6.1  --  6.3  --   --   --   --   --   NEUTROABS 4.6  --   --   --   --   --   --   --   HGB 8.3*   < > 7.8* 8.7* 7.5* 7.8* 8.5* 6.7*  HCT 27.8*   < > 25.5* 29.8* 25.3* 26.2* 29.2* 22.4*  MCV 74.5*  --  72.0*  --   --   --   --   --   PLT 235  --  248  --   --   --   --   --    < > = values in this interval not displayed.   Basic Metabolic Panel: Recent Labs  Lab 07/29/20 1204 07/29/20 1248 07/30/20 0513 07/31/20 0127  NA 139 141 138 137  K 4.5 4.5 3.9 4.1  CL 107 108 105 103  CO2 21*  --  24 23  GLUCOSE 97 93 104* 111*  BUN 20 29* 21 25*  CREATININE 1.77* 1.80* 1.78* 2.05*  CALCIUM 8.9  --  9.0 9.2   GFR: Estimated Creatinine Clearance: 27 mL/min (A) (by C-G formula based on SCr of 2.05 mg/dL (H)). Liver Function  Tests: Recent Labs  Lab 07/29/20 1204  AST 34  ALT 8  ALKPHOS 77  BILITOT 0.7  PROT 7.6  ALBUMIN 3.5   Recent Labs  Lab 07/29/20 1204  LIPASE 36   No results for input(s): AMMONIA in the last 168 hours. Coagulation Profile: No results for input(s): INR, PROTIME in the last 168 hours. Cardiac Enzymes: No results for input(s): CKTOTAL, CKMB, CKMBINDEX, TROPONINI in the last 168 hours. BNP (last  3 results) No results for input(s): PROBNP in the last 8760 hours. HbA1C: Recent Labs    08/01/20 0128  HGBA1C 5.9*   CBG: Recent Labs  Lab 07/29/20 1300  GLUCAP 82   Lipid Profile: Recent Labs    08/01/20 0128  CHOL 187  HDL 34*  LDLCALC 134*  TRIG 94  CHOLHDL 5.5   Thyroid Function Tests: No results for input(s): TSH, T4TOTAL, FREET4, T3FREE, THYROIDAB in the last 72 hours. Anemia Panel: Recent Labs    07/30/20 0513 07/31/20 1215  VITAMINB12  --  161*  FERRITIN 7*  --   TIBC 417  --   IRON 26*  --    Sepsis Labs: No results for input(s): PROCALCITON, LATICACIDVEN in the last 168 hours.  Recent Results (from the past 240 hour(s))  SARS CORONAVIRUS 2 (TAT 6-24 HRS) Nasopharyngeal Nasopharyngeal Swab     Status: None   Collection Time: 07/29/20  5:48 PM   Specimen: Nasopharyngeal Swab  Result Value Ref Range Status   SARS Coronavirus 2 NEGATIVE NEGATIVE Final    Comment: (NOTE) SARS-CoV-2 target nucleic acids are NOT DETECTED.  The SARS-CoV-2 RNA is generally detectable in upper and lower respiratory specimens during the acute phase of infection. Negative results do not preclude SARS-CoV-2 infection, do not rule out co-infections with other pathogens, and should not be used as the sole basis for treatment or other patient management decisions. Negative results must be combined with clinical observations, patient history, and epidemiological information. The expected result is Negative.  Fact Sheet for  Patients: SugarRoll.be  Fact Sheet for Healthcare Providers: https://www.woods-mathews.com/  This test is not yet approved or cleared by the [place] FDA and  has been authorized for detection and/or diagnosis of SARS-CoV-2 by FDA under an Emergency Use Authorization (EUA). This EUA will remain  in effect (meaning this test can be used) for the duration of the COVID-19 declaration under Se ction 564(b)(1) of the Act, 21 U.S.C. section 360bbb-3(b)(1), unless the authorization is terminated or revoked sooner.  Performed at [facility], [place] [address] [place], [place] [address]   MRSA PCR Screening     Status: None   Collection Time: 07/29/20  8:34 PM   Specimen: Nasal Mucosa; Nasopharyngeal  Result Value Ref Range Status   MRSA by PCR NEGATIVE NEGATIVE Final    Comment:        The GeneXpert MRSA Assay (FDA approved for NASAL specimens only), is one component of a comprehensive MRSA colonization surveillance program. It is not intended to diagnose MRSA infection nor to guide or monitor treatment for MRSA infections. Performed at [facility], [place] [address] [place], [place] [address]          Radiology Studies: DG Ribs Unilateral Left  Result Date: 07/30/2020 CLINICAL DATA:  Anterior rib pain. EXAM: LEFT RIBS - 2 VIEW COMPARISON:  Chest x-ray 07/29/2020 and 02/09/2015 FINDINGS: No fracture or other bone lesions are seen involving the ribs. IMPRESSION: Negative. Electronically Signed   By: Marin Olp M.D.   On: 07/30/2020 10:29   MR ANGIO HEAD WO CONTRAST  Result Date: 07/31/2020 CLINICAL DATA:  Left-sided numbness. Bilateral infarction shown by MRI. EXAM: MRA HEAD WITHOUT CONTRAST TECHNIQUE: Angiographic images of the Circle of Willis were obtained using MRA technique without intravenous contrast. COMPARISON:  MRI same day FINDINGS: The study is somewhat hindered by the presence of T1 shortening age in,  probably 39. Both internal carotid arteries appear widely patent through the  skull base and siphon regions. The anterior and middle cerebral vessels are patent without proximal stenosis, aneurysm or vascular malformation. No sign of large or medium vessel occlusion. Both vertebral arteries are widely patent to the basilar. No basilar stenosis. Posterior circulation branch vessels appear normal. IMPRESSION: Artifact related to intravascular T1 shortening agent, probably Feraheme or similar. Normal intracranial MR angiography of the large and medium size vessels allowing for that. Electronically Signed   By: Nelson Chimes M.D.   On: 07/31/2020 18:59   MR BRAIN WO CONTRAST  Result Date: 07/31/2020 CLINICAL DATA:  Neuro deficit, acute, stroke suspected EXAM: MRI HEAD WITHOUT CONTRAST TECHNIQUE: Multiplanar, multiecho pulse sequences of the brain and surrounding structures were obtained without intravenous contrast. COMPARISON:  07/29/2020. FINDINGS: Please note some image sequences are mildly degraded by motion artifact. Brain: Scattered small acute infarcts involving the left frontal lobe, left occipital cortex and right periatrial white matter. Query late subacute/early chronic right corona radiata and left frontal white matter insults. Mild cerebral atrophy with ex vacuo dilatation. Mild chronic microvascular ischemic changes. No intracranial hemorrhage. Chronic right thalamic insult. No midline shift, ventriculomegaly or extra-axial fluid collection. No mass lesion. Vascular: Normal flow voids. Skull and upper cervical spine: Normal marrow signal. Sinuses/Orbits: Normal orbits. Clear paranasal sinuses. No mastoid effusion. Other: Sequela of recent Feraheme transfusion limits evaluation on SWI. IMPRESSION: Small acute left frontal, left occipital and right periatrial white matter infarcts. Query late subacute/early chronic right corona radiata and left frontal white matter insult. Chronic right thalamic  insult. Mild chronic microvascular ischemic changes. These results were called by telephone at the time of interpretation on 07/31/2020 at 3:24 pm to provider [facility] , who verbally acknowledged these results. Electronically Signed   By: Primitivo Gauze M.D.   On: 07/31/2020 15:28   MR ABDOMEN W WO CONTRAST  Result Date: 07/30/2020 CLINICAL DATA:  Indeterminate soft tissue stranding in the porta hepatis on prior CT. Question neoplasm. Indeterminate left renal lesion. EXAM: MRI ABDOMEN WITHOUT AND WITH CONTRAST TECHNIQUE: Multiplanar multisequence MR imaging of the abdomen was performed both before and after the administration of intravenous contrast. CONTRAST:  7.38mL GADAVIST GADOBUTROL 1 MMOL/ML IV SOLN COMPARISON:  Radiograph 12/06/2013. Abdominal CTA 07/29/2020 and chest CT 08/07/2011. FINDINGS: Despite efforts by the technologist and patient, mild motion artifact is present on today's exam and could not be eliminated. This reduces exam sensitivity and specificity. Lower chest: Right retrocrural cystic lesion measuring up to 4.1 cm on image 17/4 is stable from the remote CT, consistent with a benign finding. Probable left ventricular hypertrophy. The visualized lower chest otherwise appears unremarkable. Hepatobiliary: There is diffusely decreased signal throughout the liver on T2 weighted images, and loss of signal on the in phase images consistent with hemosiderosis. There are scattered hepatic cysts. No suspicious liver lesions. Multiple gallstones are again noted. No evidence of gallbladder wall thickening or biliary dilatation. Pancreas: Unremarkable. No pancreatic ductal dilatation or surrounding inflammatory changes. Spleen: Diffusely decreased T2 signal consistent with hemosiderosis. No focal abnormality or splenomegaly. Adrenals/Urinary Tract: Both adrenal glands appear normal. Bilateral renal cysts are noted, largest in the upper pole of the left kidney, measuring 3.2 cm in diameter. The  indeterminate lesion medially in the upper pole of the left kidney is suboptimally evaluated by this examination due to motion artifact. However, there is a hypoenhancing lesion in this area which measures 1.1 cm, best seen on image 35/20. In addition, there is an 8 mm lesion anteriorly in the interpolar region of  the right kidney (image 38/20) which also demonstrates low-level enhancement and low T2 signal. No hydronephrosis. Stomach/Bowel: Moderate ingested material in the stomach. No evidence bowel wall thickening, distention or surrounding inflammation. Vascular/Lymphatic: Aortic and branch vessel atherosclerosis. No evidence of aneurysm or large vessel occlusion. The portal, superior mesenteric and splenic veins appear patent. No enlarged abdominal lymph nodes are seen. Other: No mass, inflammation or abnormal enhancement is seen within the porta hepatis. Evaluation is somewhat limited by the breathing artifact, although no significant abnormality is suspected. Musculoskeletal: No acute or significant osseous findings. Multilevel spondylosis. 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 IMPRESSION: 1. No evidence of mass in the porta hepatis. The pancreas appears normal, and there is no biliary or pancreatic ductal dilatation. 2. Small hypoenhancing lesions medially in the upper pole of the left kidney and anteriorly in the interpolar region of the right kidney, suspicious for small neoplasms. These lesions are incompletely evaluated by this examination due to motion artifact. Clinical significance is doubtful given the patient's age, and dedicated renal MR or CT (pre and post contrast) follow-up in 6-12 months recommended. 3. Cholelithiasis without evidence of biliary dilatation. 4. Hemosiderosis. Electronically Signed   By: Richardean Sale M.D.   On: 07/30/2020 16:36   ECHOCARDIOGRAM COMPLETE  Result Date: 07/30/2020    ECHOCARDIOGRAM REPORT   Patient  Name:   CODI KERTZ Date of Exam: 07/30/2020 Medical Rec #:  [id number]     Height:       71.0 in Accession #:    [id number]    Weight:       169.8 lb Date of Birth:  December 09, 1930     BSA:          1.967 m Patient Age:    83 years      BP:           145/62 mmHg Patient Gender: M             HR:           72 bpm. Exam Location:  Inpatient Procedure: 2D Echo, Color Doppler, Cardiac Doppler, Strain Analysis and 3D Echo Indications:    Chest Pain R07.9  History:        Patient has no prior history of Echocardiogram examinations.                 Risk Factors:Hypertension, Dyslipidemia and Current Smoker.                 Chronic kidney disease. GERD. Anemia. Left kidney lesion.  Sonographer:    Darlina Sicilian RDCS Referring Phys: [id number] [place]  1. Left ventricular ejection fraction, by estimation, is 60 to 65%. The left ventricle has normal function. The left ventricle has no regional wall motion abnormalities. There is moderate concentric left ventricular hypertrophy. Left ventricular diastolic parameters are consistent with Grade II diastolic dysfunction (pseudonormalization). The global longitudinal strain is normal.  2. Right ventricular systolic function is normal. The right ventricular size is normal. Mildly increased right ventricular wall thickness.  3. Left atrial size was moderately dilated.  4. The mitral valve is grossly normal. Trivial mitral valve regurgitation.  5. The aortic valve is tricuspid. There is mild calcification of the aortic valve. Aortic valve regurgitation is not visualized. No aortic stenosis is present.  6. There is borderline dilatation of the ascending aorta, measuring 37 mm.  7. The inferior vena cava is normal in size  with greater than 50% respiratory variability, suggesting right atrial pressure of 3 mmHg. Comparison(s): No prior Echocardiogram. FINDINGS  Left Ventricle: No systolic anterior motion of the mitral valve. Left ventricular ejection fraction, by estimation,  is 60 to 65%. The left ventricle has normal function. The left ventricle has no regional wall motion abnormalities. The global longitudinal strain is normal. The left ventricular internal cavity size was normal in size. There is moderate concentric left ventricular hypertrophy. Left ventricular diastolic parameters are consistent with Grade II diastolic dysfunction (pseudonormalization). Right Ventricle: The right ventricular size is normal. Mildly increased right ventricular wall thickness. Right ventricular systolic function is normal. Left Atrium: Left atrial size was moderately dilated. Right Atrium: Right atrial size was normal in size. Pericardium: There is no evidence of pericardial effusion. Mitral Valve: The mitral valve is grossly normal. Trivial mitral valve regurgitation. Tricuspid Valve: The tricuspid valve is normal in structure. Tricuspid valve regurgitation is not demonstrated. No evidence of tricuspid stenosis. Aortic Valve: The aortic valve is tricuspid. There is mild calcification of the aortic valve. There is mild aortic valve annular calcification. Aortic valve regurgitation is not visualized. No aortic stenosis is present. Pulmonic Valve: The pulmonic valve was grossly normal. Pulmonic valve regurgitation is trivial. No evidence of pulmonic stenosis. Aorta: ASHI 2.05 cm/m (low risk). The aortic root is normal in size and structure. There is borderline dilatation of the ascending aorta, measuring 37 mm. Venous: The inferior vena cava is normal in size with greater than 50% respiratory variability, suggesting right atrial pressure of 3 mmHg. IAS/Shunts: The atrial septum is grossly normal.  LEFT VENTRICLE PLAX 2D LVIDd:         4.60 cm  Diastology LVIDs:         3.00 cm  LV e' medial:    5.44 cm/s LV PW:         1.50 cm  LV E/e' medial:  14.1 LV IVS:        1.40 cm  LV e' lateral:   5.98 cm/s LVOT diam:     2.10 cm  LV E/e' lateral: 12.8 LV SV:         83 LV SV Index:   42       2D Longitudinal  Strain LVOT Area:     3.46 cm 2D Strain GLS (A4C):   -23.0 %  RIGHT VENTRICLE RV S prime:     20.20 cm/s TAPSE (M-mode): 2.8 cm LEFT ATRIUM             Index       RIGHT ATRIUM           Index LA diam:        3.80 cm 1.93 cm/m  RA Area:     17.70 cm LA Vol (A2C):   84.7 ml 43.07 ml/m RA Volume:   45.10 ml  22.93 ml/m LA Vol (A4C):   60.7 ml 30.87 ml/m LA Biplane Vol: 90.6 ml 46.07 ml/m  AORTIC VALVE LVOT Vmax:   124.00 cm/s LVOT Vmean:  80.900 cm/s LVOT VTI:    0.241 m  AORTA Ao Root diam: 3.30 cm Ao Asc diam:  3.70 cm MITRAL VALVE MV Area (PHT): 2.87 cm    SHUNTS MV Decel Time: 264 msec    Systemic VTI:  0.24 m MV E velocity: 76.70 cm/s  Systemic Diam: 2.10 cm MV A velocity: 83.10 cm/s MV E/A ratio:  0.92 Rudean Haskell MD Electronically signed by Rudean Haskell MD Signature Date/Time: 07/30/2020/12:48:22  PM    Final         Scheduled Meds: . allopurinol  200 mg Oral Daily  . amLODipine  10 mg Oral Daily  . carvedilol  3.125 mg Oral BID WC  . cyanocobalamin  1,000 mcg Intramuscular Daily  . hydrALAZINE  50 mg Oral Q8H  . pantoprazole (PROTONIX) IV  40 mg Intravenous Q12H  . simvastatin  20 mg Oral Daily  . tamsulosin  0.4 mg Oral Daily   Continuous Infusions: . sodium chloride       LOS: 2 days    Time spent: 35 minutes    Belkys A Regalado, MD [facility]   If 7PM-7AM, please contact night-coverage [email or web address]  08/01/2020, 7:45 AM

## 2020-08-01 NOTE — Evaluation (Signed)
Speech Language Pathology Evaluation Patient Details Name: BRINTON BRANDEL MRN: [id number] DOB: November 06, 1930 Today's Date: 08/01/2020 Time: 7322-0254 SLP Time Calculation (min) (ACUTE ONLY): 23 min  Problem List:  Patient Active Problem List   Diagnosis Date Noted  . Cerebral embolism with cerebral infarction 08/01/2020  . Melena   . Iron deficiency anemia   . Abnormal CT of liver   . HTN (hypertension), malignant 07/29/2020  . Hypertensive urgency   . AKI (acute kidney injury) ([place])   . Anemia of chronic disease 05/07/2017  . Gouty arthritis 03/21/2016  . CKD (chronic kidney disease) stage 3, GFR 30-59 ml/min ([facility]) 02/09/2015  . TOBACCO ABUSE 04/01/2008  . BENIGN PROSTATIC HYPERTROPHY 04/01/2008  . Dyslipidemia 05/26/2007  . Essential hypertension 02/07/2007   Past Medical History:  Past Medical History:  Diagnosis Date  . Anxiety   . BENIGN PROSTATIC HYPERTROPHY 04/01/2008  . Coronary artery disease   . GERD (gastroesophageal reflux disease)   . HYPERLIPIDEMIA 05/26/2007  . HYPERTENSION 02/07/2007  . Syncope and collapse   . TOBACCO ABUSE 04/01/2008   Past Surgical History:  Past Surgical History:  Procedure Laterality Date  . APPENDECTOMY    . TONSILLECTOMY     HPI:  Pt is an 87 y.o. male presenting with hyptertensive emergency. MRI 2/27 revealed small acute left frontal, left occipital and right periatrial white matter infarcts. PMH significant for HTN, hyperlipidemia, GERD, coronary artery disease.   Assessment / Plan / Recommendation Clinical Impression  Pt presents with a more than mild impairment as evidenced by his score of 18 out of 30 on the SLUMS (27-30 is considered normal). Question impact of hearing loss and effort level on pt's overall score. Pt appears to demonstrate difficulty with selective attention especially with longer tasks such as listening to a story and answering questions. 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 Pt also demonstrated some difficulty with solving a simple math problem; however, with extra support and processing time he showed good problem solving skills for this task. Pt doesn't believe that his cognitive-communicative skills are impacted and says that doesn't feel different from baseline. Recomend f/u for speech therapy but pt politefully declined - SLP encouraged support from family once pt returns home and he agreed.    SLP Assessment  SLP Recommendation/Assessment: Patient needs continued Speech Lanaguage Pathology Services SLP Visit Diagnosis: Cognitive communication deficit (R41.841)    Follow Up Recommendations  Other (comment) (Daughter to support pt at home)    Frequency and Duration           SLP Evaluation Cognition  Overall Cognitive Status: Impaired/Different from baseline Arousal/Alertness: Awake/alert Orientation Level: Oriented to place;Oriented to time;Oriented to person;Oriented to situation Attention: Selective Selective Attention: Impaired Selective Attention Impairment: Verbal basic Memory: Impaired Memory Impairment: Decreased recall of new information;Decreased short term memory Decreased Short Term Memory: Verbal basic Awareness: Impaired Awareness Impairment: Anticipatory impairment Problem Solving: Appears intact Behaviors: Poor frustration tolerance Safety/Judgment: Impaired       Comprehension  Auditory Comprehension Overall Auditory Comprehension: Impaired Yes/No Questions: Not tested Commands: Impaired One Step Basic Commands: 50-74% accurate Conversation: Simple Interfering Components: Attention;Hearing;Processing speed;Working memory EffectiveTechniques: Geophysicist/field seismologist;Increased volume;Repetition;Slowed speech;Stressing words    Expression Expression Primary Mode of Expression: Verbal Verbal Expression Overall Verbal Expression: Appears within functional limits for tasks  assessed   Oral / Motor  Motor Speech Overall Motor Speech: Appears within functional limits for tasks assessed  GO                    Jeanine Luz., SLP Student 08/01/2020, 11:47 AM

## 2020-08-01 NOTE — Progress Notes (Signed)
STROKE TEAM PROGRESS NOTE   INTERVAL HISTORY His RN is at the bedside.  Patient states he still has numbness around his face and his left hand which is slightly improved but still persistent.  Vital signs are stable.  MRI scan shows bilateral tiny lacunar infarcts subcortical regions.  MRI of the brain shows no significant large vessel stenosis or occlusion.  Patient had a recent GI hemorrhage has gotten iron transfusion.  Vitals:   08/01/20 0423 08/01/20 0630 08/01/20 0700 08/01/20 1045  BP: (!) 153/67 (!) 167/96 (!) 158/62 (!) 151/98  Pulse: 60 77 67 77  Resp: 20 18 (!) 22 (!) 22  Temp: (!) 97.3 F (36.3 C) 97.6 F (36.4 C) 98.4 F (36.9 C) 97.7 F (36.5 C)  TempSrc: Oral Oral  Oral  SpO2: 99%   98%  Weight:      Height:       CBC:  Recent Labs  Lab 07/29/20 1204 07/29/20 1248 07/30/20 0513 07/30/20 1338 08/01/20 0128 08/01/20 1321  WBC 6.1  --  6.3  --   --  7.3  NEUTROABS 4.6  --   --   --   --   --   HGB 8.3*   < > 7.8*   < > 6.7* 8.4*  HCT 27.8*   < > 25.5*   < > 22.4* 27.3*  MCV 74.5*  --  72.0*  --   --  73.2*  PLT 235  --  248  --   --  201   < > = values in this interval not displayed.   Basic Metabolic Panel:  Recent Labs  Lab 07/31/20 0127 08/01/20 1321  NA 137 136  K 4.1 4.1  CL 103 105  CO2 23 22  GLUCOSE 111* 100*  BUN 25* 32*  CREATININE 2.05* 2.34*  CALCIUM 9.2 8.8*   Lipid Panel:  Recent Labs  Lab 08/01/20 0128  CHOL 187  TRIG 94  HDL 34*  CHOLHDL 5.5  VLDL 19  LDLCALC 134*   HgbA1c:  Recent Labs  Lab 08/01/20 0128  HGBA1C 5.9*   Urine Drug Screen: No results for input(s): LABOPIA, COCAINSCRNUR, LABBENZ, AMPHETMU, THCU, LABBARB in the last 168 hours.  Alcohol Level No results for input(s): ETH in the last 168 hours.  IMAGING past 24 hours MR ANGIO HEAD WO CONTRAST  Result Date: 07/31/2020 IMPRESSION: Artifact related to intravascular T1 shortening agent, probably Feraheme or similar. Normal intracranial MR angiography of the  large and medium size vessels allowing for that.   MR BRAIN WO CONTRAST  Result Date: 07/31/2020 IMPRESSION: Small acute left frontal, left occipital and right periatrial white matter infarcts. Query late subacute/early chronic right corona radiata and left frontal white matter insult. Chronic right thalamic insult. Mild chronic microvascular ischemic changes.   CT head Result Date: 07/29/2020 Mild diffuse cortical atrophy. Mild chronic ischemic white matter disease. No acute intracranial abnormality seen.  PHYSICAL EXAM Pleasant frail elderly African-American male not in distress. . Afebrile. Head is nontraumatic. Neck is supple without bruit.    Cardiac exam no murmur or gallop. Lungs are clear to auscultation. Distal pulses are well felt. Neurological Exam ;  Awake  Alert oriented x 3. Normal speech and language.eye movements full without nystagmus.fundi were not visualized. Vision acuity and fields appear normal. Hearing is normal. Palatal movements are normal. Face symmetric. Tongue midline. Normal strength, tone, reflexes and coordination.  Diminished perioral and left hand touch pinprick sensation. Gait deferred. ASSESSMENT/PLAN Mr. Ariz Terrones  Coates is a 87 y.o. male with history of coronary artery disease, hypertension noncompliant to medications, CKD 3, hyperlipidemia, tobacco abuse, admitted to the hospital since 07/29/2020, for evaluation of complaints of chest pain and rib pain for about 1 month.  He developed left facial and hand numbness a few weeks ago.  An MRI of the brain which showed small acute left frontal, left occipital and right parietal white matter infarcts and a question of possible late subacute or early chronic right corona radiata and left frontal white matter infarcts along with chronic right thalamic insults. LDL 134, HgbA1c 5.9  Stroke:  multiple lacunar strokes (left frontal, occipital and parietal white matter infarcts) likely secondary to cardioembolic versus small  vessel disease   CT head No acute abnormality. Mild diffuse cortical atrophy.   MRA head:  Normal intracranial MR  MRI brain: Small acute left frontal, left occipital and right periatrial white matter infarcts. subacute/early chronic right corona radiata and left frontal white matter insult. Chronic right thalamic insult  Carotid Doppler  Left and right ICA 1-39% stenosis  2D Echo: EF 60-65%, LVH, Grade 2 diastolic dysfunction  LDL 134  HgbA1c 5.9  VTE prophylaxis - holding 2/2 anemia, SCDs for now  No antithrombotic prior to admission, now on No antithrombotic due to acute anemia/GI bleed, recommend enteric-coated aspirin 81mg  once GI medicine deems safe from GI/anemia stand point  Therapy recommendations:  pending  Disposition:  pending  Hypertension, Emergency  Admission BP 240/100  Home meds:  Not currently taking any medication  Unstable  Currently on Norvasc 10mg  daily, coreg 3.125mg  bid, Hydralazine 50mg  q 8hr . Long-term BP goal normotensive  Hyperlipidemia  Home meds:  Does not take medication  Add simvastatin 40 mg daily  LDL 134, goal < 70  Continue statin at discharge   Anemia, Iron deficiency/GI bleed  Chronic NSAID use  FOBT positive stool  Serial H/H checks  EGD and colonoscopy pending  Continuous PPI  Received IV iron 2/26  PRBC 1 unit given  hgb 8.5 -> 6.7 -> 8.4  Tobacco Abuse  Advised to stop smoking  Nicotine patch  Other Stroke Risk Factors  Advanced Age >/= 39   Cigarette smoker will advised to stop smoking  Coronary artery disease  Other Active Problems  Chronic Kidney disease, stage IIIb  Baseline 1.6 -2.1  Creatinine on admission 1.8, current 2.34  Flomax  Strict In/outs  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    pulmonologist outpatient   Hospital day # 2  I have personally obtained history,examined this patient, reviewed notes, independently viewed imaging studies, participated in medical decision making and plan of care.ROS completed by me personally and pertinent positives fully documented  I have made any additions or clarifications directly to the above note. Agree with note above.  He has presented with sudden onset of perioral and left hand paresthesias likely from his possible lacunar concurrent infarcts from small vessel disease.  Patient had recent GI bleeding requiring iron transfusion.  Hence recommend to not do dual antiplatelet therapy and consider only coated 81 mg aspirin if okay from GI standpoint.  Aggressive risk factor modification.  Discussed with Dr.Regalado  Greater than 50% time during this 35-minute visit was spent on counseling and coordination of care  about his lacunar strokes and answering questions about stroke prevention treatment Antony Contras, MD Medical Director [place] Pager: [phone] 08/01/2020 4:49 PM   To contact Stroke Continuity provider, please refer to [email or web address] After hours, contact General Neurology

## 2020-08-01 NOTE — Plan of Care (Signed)
  Problem: Education: Goal: Knowledge of General Education information will improve Description: Including pain rating scale, medication(s)/side effects and non-pharmacologic comfort measures Outcome: Progressing   Problem: Health Behavior/Discharge Planning: Goal: Ability to manage health-related needs will improve Outcome: Progressing   Problem: Clinical Measurements: Goal: Will remain free from infection Outcome: Progressing Goal: Diagnostic test results will improve Outcome: Progressing Goal: Respiratory complications will improve Outcome: Progressing Goal: Cardiovascular complication will be avoided Outcome: Progressing   Problem: Coping: Goal: Level of anxiety will decrease Outcome: Progressing   Problem: Elimination: Goal: Will not experience complications related to bowel motility Outcome: Progressing   Problem: Pain Managment: Goal: General experience of comfort will improve Outcome: Progressing   Problem: Safety: Goal: Ability to remain free from injury will improve Outcome: Progressing   Problem: Skin Integrity: Goal: Risk for impaired skin integrity will decrease Outcome: Progressing

## 2020-08-01 NOTE — Progress Notes (Signed)
PT Cancellation Note  Patient Details Name: Isaac Phelps MRN: [id number] DOB: 1930-07-26   Cancelled Treatment:    Reason Eval/Treat Not Completed: Medical issues which prohibited therapy, HGB low and receiving blood. Planned colonoscopy for today.  Lyanne Co, DPT Acute Rehabilitation Services [phone]   [place] 08/01/2020, 9:57 AM

## 2020-08-01 NOTE — Progress Notes (Signed)
OT Cancellation Note  Patient Details Name: [name] MRN: [id number] DOB: 07/27/1930   Cancelled Treatment:    Reason Eval/Treat Not Completed: Patient at procedure or test/ unavailable;Medical issues which prohibited therapy (Hb 6.7 and Pt to go for Colonoscopy). OT will continue to follow for evaluation.   [place] 08/01/2020, 10:48 AM

## 2020-08-02 LAB — BASIC METABOLIC PANEL
Anion gap: 12 (ref 5–15)
BUN: 34 mg/dL — ABNORMAL HIGH (ref 8–23)
CO2: 21 mmol/L — ABNORMAL LOW (ref 22–32)
Calcium: 9.2 mg/dL (ref 8.9–10.3)
Chloride: 102 mmol/L (ref 98–111)
Creatinine, Ser: 2.21 mg/dL — ABNORMAL HIGH (ref 0.61–1.24)
GFR, Estimated: 28 mL/min — ABNORMAL LOW (ref >60)
Glucose, Bld: 124 mg/dL — ABNORMAL HIGH (ref 70–99)
Potassium: 3.9 mmol/L (ref 3.5–5.1)
Sodium: 135 mmol/L (ref 135–145)

## 2020-08-02 LAB — CBC
HCT: 28.9 % — ABNORMAL LOW (ref 39.0–52.0)
Hemoglobin: 9 g/dL — ABNORMAL LOW (ref 13.0–17.0)
MCH: 22.8 pg — ABNORMAL LOW (ref 26.0–34.0)
MCHC: 31.1 g/dL (ref 30.0–36.0)
MCV: 73.4 fL — ABNORMAL LOW (ref 80.0–100.0)
Platelets: 211 10*3/uL (ref 150–400)
RBC: 3.94 MIL/uL — ABNORMAL LOW (ref 4.22–5.81)
RDW: 17.9 % — ABNORMAL HIGH (ref 11.5–15.5)
WBC: 8 10*3/uL (ref 4.0–10.5)
nRBC: 0 % (ref 0.0–0.2)

## 2020-08-02 LAB — BPAM RBC
Blood Product Expiration Date: 202203312359
ISSUE DATE / TIME: 202202280639
Unit Type and Rh: 5100

## 2020-08-02 LAB — HEMOGLOBIN AND HEMATOCRIT, BLOOD
HCT: 25.1 % — ABNORMAL LOW (ref 39.0–52.0)
HCT: 27.7 % — ABNORMAL LOW (ref 39.0–52.0)
Hemoglobin: 7.6 g/dL — ABNORMAL LOW (ref 13.0–17.0)
Hemoglobin: 8.7 g/dL — ABNORMAL LOW (ref 13.0–17.0)

## 2020-08-02 LAB — TYPE AND SCREEN
ABO/RH(D): O POS
Antibody Screen: NEGATIVE
Unit division: 0

## 2020-08-02 MED ORDER — PEG-KCL-NACL-NASULF-NA ASC-C 100 G PO SOLR
0.5000 | Freq: Once | ORAL | Status: AC
  Administered 2020-08-02: 100 g via ORAL
  Filled 2020-08-02: qty 1

## 2020-08-02 MED ORDER — PEG-KCL-NACL-NASULF-NA ASC-C 100 G PO SOLR: 1.0000 | Freq: Once | ORAL | Status: DC

## 2020-08-02 MED ORDER — PEG-KCL-NACL-NASULF-NA ASC-C 100 G PO SOLR
0.5000 | Freq: Once | ORAL | Status: AC
  Administered 2020-08-03: 100 g via ORAL
  Filled 2020-08-02: qty 1

## 2020-08-02 NOTE — Evaluation (Signed)
Physical Therapy Evaluation Patient Details Name: Isaac Phelps MRN: [id number] DOB: 1930-06-11 Today's Date: 08/02/2020   History of Present Illness  Isaac Phelps is a 87 y.o. male with PMH including CAD, HTN, CKD 3, hyperlipidemia, tobacco abuse, admitted to the hospital since 07/29/2020, for evaluation of complaints of chest pain and rib pain for about 1 month; high BP, left facial and hand numbness. An MRI of the brain was obtained in light of the reported findings of left-sided numbness and showed small acute left frontal, left occipital and right parietal white matter infarcts and a question of possible late subacute or early chronic right corona radiata and left frontal white matter infarcts along with chronic right thalamic insults. Also being worked up for anemia with concern for GI bleed.  Clinical Impression  Patient evaluated by Physical Therapy with no further acute PT needs identified. All education has been completed and the patient has no further questions. Pt is at baseline level of function and has no follow-up Physical Therapy or equipment needs. PT is signing off. Thank you for this referral.     Follow Up Recommendations No PT follow up    Equipment Recommendations  None recommended by PT       Precautions / Restrictions Precautions Precautions: None Restrictions Weight Bearing Restrictions: No      Mobility  Bed Mobility               General bed mobility comments: EOB at beginning of session, and walking with PT at the end of session    Transfers Overall transfer level: Modified independent Equipment used: None             General transfer comment: no LOB, increased time, chose not to use SPC (in room)  Ambulation/Gait Ambulation/Gait assistance: Supervision Gait Distance (Feet): 450 Feet Assistive device: None Gait Pattern/deviations: Step-through pattern;Decreased step length - right;Decreased step length - left;Shuffle;Antalgic;Drifts  right/left Gait velocity: slowed Gait velocity interpretation: <1.31 ft/sec, indicative of household ambulator General Gait Details: pt with functional, baseline mobility, arthritic knees lacking full ROM, mild instability, no LoB        Balance Overall balance assessment: Mild deficits observed, not formally tested (baseline arthritis - reports no falls at home)                                           Pertinent Vitals/Pain Pain Assessment: Faces Faces Pain Scale: No hurt Pain Intervention(s): Monitored during session    Home Living Family/patient expects to be discharged to:: Private residence Living Arrangements: Alone Available Help at Discharge: Family;Available PRN/intermittently (daughter, wife lives in [place]) Type of Home: House Home Access: Stairs to enter Entrance Stairs-Rails: None Technical brewer of Steps: 1 Home Layout: One level Home Equipment: Cane - single point;Grab bars - tub/shower Additional Comments: 2 bathrooms, bigger one has grab bars    Prior Function Level of Independence: Independent         Comments: drives, visits wife at nursing home, enjoys watching sports TV, uses cane for community mobility for safety     Hand Dominance   Dominant Hand: Right    Extremity/Trunk Assessment   Upper Extremity Assessment Upper Extremity Assessment: Defer to OT evaluation LUE Deficits / Details: sensory deficits, but functional LUE Sensation: decreased light touch LUE Coordination: WNL    Lower Extremity Assessment Lower Extremity Assessment: Generalized weakness (limited tolerance  of PT services today, reports knee arthritis and from gait analysis knees lack full ROM, at functional baseline)    Cervical / Trunk Assessment Cervical / Trunk Assessment: Other exceptions Cervical / Trunk Exceptions: forward head rounded shoulders  Communication   Communication: No difficulties  Cognition Arousal/Alertness:  Awake/alert Behavior During Therapy: WFL for tasks assessed/performed Overall Cognitive Status: Within Functional Limits for tasks assessed                                        General Comments General comments (skin integrity, edema, etc.): VSS on RA        Assessment/Plan    PT Assessment Patent does not need any further PT services  PT Problem List         PT Treatment Interventions      PT Goals (Current goals can be found in the Care Plan section)  Acute Rehab PT Goals Patient Stated Goal: To get home and be able to go visit wife PT Goal Formulation: All assessment and education complete, DC therapy     AM-PAC PT "6 Clicks" Mobility  Outcome Measure Help needed turning from your back to your side while in a flat bed without using bedrails?: None Help needed moving from lying on your back to sitting on the side of a flat bed without using bedrails?: None Help needed moving to and from a bed to a chair (including a wheelchair)?: None Help needed standing up from a chair using your arms (e.g., wheelchair or bedside chair)?: None Help needed to walk in hospital room?: None Help needed climbing 3-5 steps with a railing? : None 6 Click Score: 24    End of Session   Activity Tolerance: Patient tolerated treatment well Patient left: in chair;with call bell/phone within reach Nurse Communication: Mobility status      Time: 1002-1016 PT Time Calculation (min) (ACUTE ONLY): 14 min   Charges:   PT Evaluation $PT Eval Moderate Complexity: 1 Mod          Irja Wheless B. Migdalia Dk PT, DPT Acute Rehabilitation Services Pager [phone] Office [phone]   [place] 08/02/2020, 10:56 AM

## 2020-08-02 NOTE — Evaluation (Signed)
Occupational Therapy Evaluation and Discharge from OT Patient Details Name: Isaac Phelps MRN: [id number] DOB: 1931-03-06 Today's Date: 08/02/2020    History of Present Illness Isaac Phelps is a 87 y.o. male with PMH including CAD, HTN, CKD 3, hyperlipidemia, tobacco abuse, admitted to the hospital since 07/29/2020, for evaluation of complaints of chest pain and rib pain for about 1 month; high BP, left facial and hand numbness. An MRI of the brain was obtained in light of the reported findings of left-sided numbness and showed small acute left frontal, left occipital and right parietal white matter infarcts and a question of possible late subacute or early chronic right corona radiata and left frontal white matter infarcts along with chronic right thalamic insults. Also being worked up for anemia with concern for GI bleed.   Clinical Impression   Pt is typically independent in ADL and transfers, uses a [facility] for community mobility for safety. Pt is still driving (visits wife who resides at nursing home), can go shopping, and eats out for most meals. Today Pt is cleaning up performing ADL when OT entered the room. He is mod I for all aspects of ADL (upper body and lower body). Pt does not have concerns about completing them at home and did not need assist today. Pt able to perform transfers at mod I without DME (SPC in room) and in room mobility without assist or LOB. Pt does still have numbness in L hand - but functional. Able to trim fingernails of R hand with clippers in L. Since Pt at mod I and with no concerns, OT will sign off at this time.     Follow Up Recommendations  No OT follow up    Equipment Recommendations  None recommended by OT    Recommendations for Other Services       Precautions / Restrictions Precautions Precautions: None Restrictions Weight Bearing Restrictions: No      Mobility Bed Mobility               General bed mobility comments: EOB at beginning of  session, and walking with PT at the end of session    Transfers Overall transfer level: Modified independent Equipment used: None             General transfer comment: no LOB, increased time, chose not to use SPC (in room)    Balance Overall balance assessment: Mild deficits observed, not formally tested (baseline arthritis - reports no falls at home)                                         ADL either performed or assessed with clinical judgement   ADL Overall ADL's : Modified independent                                       General ADL Comments: performed shaving, trimming nails, brushing teeth, washing up, donning brief, donning shoes without assist (RN witnessed what OT did not)     Vision Baseline Vision/History: Wears glasses Wears Glasses: At all times Patient Visual Report: No change from baseline Vision Assessment?: No apparent visual deficits     Perception     Praxis      Pertinent Vitals/Pain Pain Assessment: Faces Faces Pain Scale: No hurt Pain Intervention(s): Monitored during  session     Hand Dominance Right   Extremity/Trunk Assessment Upper Extremity Assessment Upper Extremity Assessment: LUE deficits/detail LUE Deficits / Details: sensory deficits, but functional LUE Sensation: decreased light touch LUE Coordination: WNL   Lower Extremity Assessment Lower Extremity Assessment: Defer to PT evaluation (baseline arthritis)   Cervical / Trunk Assessment Cervical / Trunk Assessment: Other exceptions Cervical / Trunk Exceptions: forward head rounded shoulders   Communication Communication Communication: No difficulties   Cognition Arousal/Alertness: Awake/alert Behavior During Therapy: WFL for tasks assessed/performed Overall Cognitive Status: Within Functional Limits for tasks assessed                                     General Comments       Exercises     Shoulder Instructions       Home Living Family/patient expects to be discharged to:: Private residence Living Arrangements: Alone Available Help at Discharge: Family;Available PRN/intermittently (daughter, wife lives in [place]) Type of Home: House Home Access: Stairs to enter Technical brewer of Steps: 1 Entrance Stairs-Rails: None Home Layout: One level     Bathroom Shower/Tub: Teacher, early years/pre: Standard Bathroom Accessibility: Yes (tight) How Accessible: Accessible via walker Home Equipment: Cane - single point;Grab bars - tub/shower   Additional Comments: 2 bathrooms, bigger one has grab bars      Prior Functioning/Environment Level of Independence: Independent        Comments: drives, visits wife at nursing home, enjoys watching sports TV, uses cane for community mobility for safety        OT Problem List: Impaired sensation;Impaired UE functional use      OT Treatment/Interventions:      OT Goals(Current goals can be found in the care plan section) Acute Rehab OT Goals Patient Stated Goal: To get home and be able to go visit wife OT Goal Formulation: With patient Time For Goal Achievement: 08/16/20 Potential to Achieve Goals: Good  OT Frequency:     Barriers to D/C:            Co-evaluation              AM-PAC OT "6 Clicks" Daily Activity     Outcome Measure Help from another person eating meals?: None Help from another person taking care of personal grooming?: None Help from another person toileting, which includes using toliet, bedpan, or urinal?: None Help from another person bathing (including washing, rinsing, drying)?: None Help from another person to put on and taking off regular upper body clothing?: None Help from another person to put on and taking off regular lower body clothing?: None 6 Click Score: 24   End of Session Equipment Utilized During Treatment: Gait belt Nurse Communication: Mobility status  Activity Tolerance: Patient  tolerated treatment well Patient left: in chair;with call bell/phone within reach  OT Visit Diagnosis: Other symptoms and signs involving the nervous system (R29.898)                Time: 7169-6789 OT Time Calculation (min): 11 min Charges:  OT General Charges $OT Visit: 1 Visit OT Evaluation $OT Eval Low Complexity: [place] OTR/L Acute Rehabilitation Services Pager: [phone] Office: [place] 08/02/2020, 10:33 AM

## 2020-08-02 NOTE — Progress Notes (Signed)
PROGRESS NOTE    Isaac Phelps  YOV:[id number] DOB: 1931/04/10 DOA: 07/29/2020 PCP: Dorothyann Peng, NP   Brief Narrative: 87 year old with past medical history significant for hypetension not taking medications, CKD stage IIIb, GERD, gout presented complaining of chest pain, rib pain on the left and epigastric pain for 1 month.  Patient complaining of left side rib cage pain for 1 month on and off.  He has been taking Tylenol and Advil as needed for pain.  He presented to the urgent care and was found to have significantly elevated blood pressure systolic blood pressure 741/287 he was referred to the ED for evaluation.  Patient denies any headache, chest pain or shortness of breath.  The ED patient received IV labetalol.  CT angiogram negative for dissection or aneurysmal. Multiples  others incidental findings as below.   Patient reported old left side facial numbness. MRI was obtain and reveal multiples small stroke. Neurology consulted. Stroke could be mall vessel vs cardio embolic. Neurology recommend start Aspirin when clear by GI.  Patient was also found to have low hb, history of melena, Occult blood positive. GI consulted, plan for endoscopy/colonoscopy 3/02.  Assessment & Plan:   Active Problems:   Essential hypertension   HTN (hypertension), malignant   Iron deficiency anemia   Abnormal CT of liver   Melena   Cerebral embolism with cerebral infarction  1-Hypertensive emergency: Secondary to noncompliance with medication. -He received IV labetalol. -Patient was a started on amlodipine and Coreg and hydralazine.  -BP better controlled.   2-Anemia; iron deficiency, GI bleed.  Hd down to 7.9--7.8--6. On IV PPI.  Had episodes of  melena in January.  GI consulted.  Discussed with Dr Leonie Man, ok to proced with endoscopy procedure.  Received IV iron 2/26. Hb down to 6, received one unit PRBC 2/28. Plan for endoscopy /colonoscopy 3/02.  3-Acute left frontal, occipital, and  right parietal infarct. Late subacute early chronic corona radiata and left frontal white matter infarcts:  Left side Facial numbness; he has had this symptoms for Months. B12 low at 175. Started supplement.  MRI of the brain positive for multiples strokes,. CTA brain no large vessel oclusion.  Neurology consulted. LDL 134. Increase Zocor to 40 mg.  HbA1c 5.9 Start Baby aspirin when ok by GI.   4-Mild elevation troponin, flat likely related to malignant hypertension Echo: Normal ejection fraction, diastolic dysfunction grade 2.    5-CT Angio incidentals finding:  A-Soft tissue haziness within the porta hepatic region, etiology is indeterminate.  Differential consideration include confluent inflammation or infiltrative neoplasm: -MRI negative for mass, in the porta hepatic region.   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  -He will need follow-up with pulmonologist.   C-Left Kidney lesion; needs Follow up MRI.  Exophytic soft tissue attenuation lesion arising of the medial cortex of the left upper pole of the kidney. Enlarge Prostate gland, Elevated PSA 38:  -Needs follow up with Urologist, Dr Junious Silk. Dr Roni Bread will help arrange follow up for patient. Keturah Barre- Enlarge prostate gland with mass-effect upon the base of the bladder: Need follow-up with urology. -PSA; 38.   6-CKD stage IIIb;  Prior cr per records 1.6---2.1 Difficulty voiding, plan to start Flomax. Cr stable around 2.   7--Left-sided rib pain;  Dedicated rib x ray negative. Start lidocaine  patch.    Estimated body mass index is 23.68 kg/m as calculated from the following:  Height as of this encounter: 5\' 11"  (1.803 m).   Weight as of this encounter: 77 kg.   DVT prophylaxis: SCD Code Status: Full code Family Communication: Discussed with daughter  2/28 Disposition Plan:  Status is: Observation  The patient remains OBS  appropriate and will d/c before 2 midnights.  Dispo: The patient is from: Home              Anticipated d/c is to: Home              Patient currently is not medically stable to d/c.  Needs GI evaluation, work up for stroke in process.    Difficult to place patient No        Consultants:   GI  Procedures:     Antimicrobials:    Subjective: He is feeling well. He was able to ambulate on the hall with PT>  Numbness figuer tips getting better.   Objective: Vitals:   08/01/20 2322 08/02/20 0334 08/02/20 0608 08/02/20 0612  BP: (!) 138/59 (!) 145/55 (!) 173/66 (!) 156/67  Pulse: (!) 59 64    Resp: 20 20    Temp: 98.6 F (37 C) 98.6 F (37 C)    TempSrc: Oral Oral    SpO2: 99% 95%    Weight:      Height:        Intake/Output Summary (Last 24 hours) at 08/02/2020 9450 Last data filed at 08/01/2020 2322 Gross per 24 hour  Intake 1130 ml  Output 200 ml  Net 930 ml   Filed Weights   07/29/20 2030  Weight: 77 kg    Examination:  General exam: NAD Respiratory system: CTA Cardiovascular system: SS 1, S 2 RRR Gastrointestinal system: BS present, soft, nt Central nervous system: Alert, follows command Extremities; No edema  Data Reviewed: I have personally reviewed following labs and imaging studies  CBC: Recent Labs  Lab 07/29/20 1204 07/29/20 1248 07/30/20 0513 07/30/20 1338 07/31/20 0127 07/31/20 1215 08/01/20 0128 08/01/20 1321 08/02/20 0109  WBC 6.1  --  6.3  --   --   --   --  7.3  --   NEUTROABS 4.6  --   --   --   --   --   --   --   --   HGB 8.3*   < > 7.8*   < > 7.8* 8.5* 6.7* 8.4* 7.6*  HCT 27.8*   < > 25.5*   < > 26.2* 29.2* 22.4* 27.3* 25.1*  MCV 74.5*  --  72.0*  --   --   --   --  73.2*  --   PLT 235  --  248  --   --   --   --  201  --    < > = values in this interval not displayed.   Basic Metabolic Panel: Recent Labs  Lab 07/29/20 1204 07/29/20 1248 07/30/20 0513 07/31/20 0127 08/01/20 1321  NA 139 141 138 137 136  K 4.5  4.5 3.9 4.1 4.1  CL 107 108 105 103 105  CO2 21*  --  24 23 22   GLUCOSE 97 93 104* 111* 100*  BUN 20 29* 21 25* 32*  CREATININE 1.77* 1.80* 1.78* 2.05* 2.34*  CALCIUM 8.9  --  9.0 9.2 8.8*   GFR: Estimated Creatinine Clearance: 23.7 mL/min (A) (by C-G formula based on SCr of 2.34 mg/dL (H)). Liver Function Tests: Recent Labs  Lab 07/29/20 1204  AST 34  ALT  8  ALKPHOS 77  BILITOT 0.7  PROT 7.6  ALBUMIN 3.5   Recent Labs  Lab 07/29/20 1204  LIPASE 36   No results for input(s): AMMONIA in the last 168 hours. Coagulation Profile: No results for input(s): INR, PROTIME in the last 168 hours. Cardiac Enzymes: No results for input(s): CKTOTAL, CKMB, CKMBINDEX, TROPONINI in the last 168 hours. BNP (last 3 results) No results for input(s): PROBNP in the last 8760 hours. HbA1C: Recent Labs    08/01/20 0128  HGBA1C 5.9*   CBG: Recent Labs  Lab 07/29/20 1300  GLUCAP 82   Lipid Profile: Recent Labs    08/01/20 0128  CHOL 187  HDL 34*  LDLCALC 134*  TRIG 94  CHOLHDL 5.5   Thyroid Function Tests: No results for input(s): TSH, T4TOTAL, FREET4, T3FREE, THYROIDAB in the last 72 hours. Anemia Panel: Recent Labs    07/31/20 1215  VITAMINB12 161*   Sepsis Labs: No results for input(s): PROCALCITON, LATICACIDVEN in the last 168 hours.  Recent Results (from the past 240 hour(s))  SARS CORONAVIRUS 2 (TAT 6-24 HRS) Nasopharyngeal Nasopharyngeal Swab     Status: None   Collection Time: 07/29/20  5:48 PM   Specimen: Nasopharyngeal Swab  Result Value Ref Range Status   SARS Coronavirus 2 NEGATIVE NEGATIVE Final    Comment: (NOTE) SARS-CoV-2 target nucleic acids are NOT DETECTED.  The SARS-CoV-2 RNA is generally detectable in upper and lower respiratory specimens during the acute phase of infection. Negative results do not preclude SARS-CoV-2 infection, do not rule out co-infections with other pathogens, and should not be used as the sole basis for treatment or other  patient management decisions. Negative results must be combined with clinical observations, patient history, and epidemiological information. The expected result is Negative.  Fact Sheet for Patients: SugarRoll.be  Fact Sheet for Healthcare Providers: https://www.woods-mathews.com/  This test is not yet approved or cleared by the [place] FDA and  has been authorized for detection and/or diagnosis of SARS-CoV-2 by FDA under an Emergency Use Authorization (EUA). This EUA will remain  in effect (meaning this test can be used) for the duration of the COVID-19 declaration under Se ction 564(b)(1) of the Act, 21 U.S.C. section 360bbb-3(b)(1), unless the authorization is terminated or revoked sooner.  Performed at [facility], [place] [address], [place], [place] [address]   MRSA PCR Screening     Status: None   Collection Time: 07/29/20  8:34 PM   Specimen: Nasal Mucosa; Nasopharyngeal  Result Value Ref Range Status   MRSA by PCR NEGATIVE NEGATIVE Final    Comment:        The GeneXpert MRSA Assay (FDA approved for NASAL specimens only), is one component of a comprehensive MRSA colonization surveillance program. It is not intended to diagnose MRSA infection nor to guide or monitor treatment for MRSA infections. Performed at [facility], [place] [address], [place], [place] [address]          Radiology Studies: MR ANGIO HEAD WO CONTRAST  Result Date: 07/31/2020 CLINICAL DATA:  Left-sided numbness. Bilateral infarction shown by MRI. EXAM: MRA HEAD WITHOUT CONTRAST TECHNIQUE: Angiographic images of the Circle of Willis were obtained using MRA technique without intravenous contrast. COMPARISON:  MRI same day FINDINGS: The study is somewhat hindered by the presence of T1 shortening age in, probably 80. Both internal carotid arteries appear widely patent through the skull base and siphon regions. The anterior and middle  cerebral vessels are patent without  proximal stenosis, aneurysm or vascular malformation. No sign of large or medium vessel occlusion. Both vertebral arteries are widely patent to the basilar. No basilar stenosis. Posterior circulation branch vessels appear normal. IMPRESSION: Artifact related to intravascular T1 shortening agent, probably Feraheme or similar. Normal intracranial MR angiography of the large and medium size vessels allowing for that. Electronically Signed   By: Nelson Chimes M.D.   On: 07/31/2020 18:59   MR BRAIN WO CONTRAST  Result Date: 07/31/2020 CLINICAL DATA:  Neuro deficit, acute, stroke suspected EXAM: MRI HEAD WITHOUT CONTRAST TECHNIQUE: Multiplanar, multiecho pulse sequences of the brain and surrounding structures were obtained without intravenous contrast. COMPARISON:  07/29/2020. FINDINGS: Please note some image sequences are mildly degraded by motion artifact. Brain: Scattered small acute infarcts involving the left frontal lobe, left occipital cortex and right periatrial white matter. Query late subacute/early chronic right corona radiata and left frontal white matter insults. Mild cerebral atrophy with ex vacuo dilatation. Mild chronic microvascular ischemic changes. No intracranial hemorrhage. Chronic right thalamic insult. No midline shift, ventriculomegaly or extra-axial fluid collection. No mass lesion. Vascular: Normal flow voids. Skull and upper cervical spine: Normal marrow signal. Sinuses/Orbits: Normal orbits. Clear paranasal sinuses. No mastoid effusion. Other: Sequela of recent Feraheme transfusion limits evaluation on SWI. IMPRESSION: Small acute left frontal, left occipital and right periatrial white matter infarcts. Query late subacute/early chronic right corona radiata and left frontal white matter insult. Chronic right thalamic insult. Mild chronic microvascular ischemic changes. These results were called by telephone at the time of interpretation on 07/31/2020 at 3:24  pm to provider [facility] , who verbally acknowledged these results. Electronically Signed   By: Primitivo Gauze M.D.   On: 07/31/2020 15:28   VAS US CAROTID  Result Date: 08/01/2020 Carotid Arterial Duplex Study Indications:       CVA. Risk Factors:      Hypertension, hyperlipidemia. Comparison Study:  no prior Performing Technologist: Abram Sander RVS  Examination Guidelines: A complete evaluation includes B-mode imaging, spectral Doppler, color Doppler, and power Doppler as needed of all accessible portions of each vessel. Bilateral testing is considered an integral part of a complete examination. Limited examinations for reoccurring indications may be performed as noted.  Right Carotid Findings: +----------+--------+--------+--------+------------------+--------+           PSV cm/sEDV cm/sStenosisPlaque DescriptionComments +----------+--------+--------+--------+------------------+--------+ CCA Prox  73      7               heterogenous               +----------+--------+--------+--------+------------------+--------+ CCA Distal68      7               heterogenous               +----------+--------+--------+--------+------------------+--------+ ICA Prox  69      15      1-39%   heterogenous               +----------+--------+--------+--------+------------------+--------+ ICA Distal64      19                                         +----------+--------+--------+--------+------------------+--------+ ECA       135     4                                          +----------+--------+--------+--------+------------------+--------+ +----------+--------+-------+--------+-------------------+  PSV cm/sEDV cmsDescribeArm Pressure (mmHG) +----------+--------+-------+--------+-------------------+ Subclavian89                                         +----------+--------+-------+--------+-------------------+ +---------+--------+--+--------+-+---------+  VertebralPSV cm/s53EDV cm/s9Antegrade +---------+--------+--+--------+-+---------+  Left Carotid Findings: +----------+--------+--------+--------+------------------+--------+           PSV cm/sEDV cm/sStenosisPlaque DescriptionComments +----------+--------+--------+--------+------------------+--------+ CCA Prox  70      12              heterogenous               +----------+--------+--------+--------+------------------+--------+ CCA Distal83      11              heterogenous               +----------+--------+--------+--------+------------------+--------+ ICA Prox  70      19      1-39%   heterogenous               +----------+--------+--------+--------+------------------+--------+ ICA Distal88      22                                         +----------+--------+--------+--------+------------------+--------+ ECA       182                                                +----------+--------+--------+--------+------------------+--------+ +----------+--------+--------+--------+-------------------+           PSV cm/sEDV cm/sDescribeArm Pressure (mmHG) +----------+--------+--------+--------+-------------------+ [id number]                                          +----------+--------+--------+--------+-------------------+ +---------+--------+--+--------+--+---------+ VertebralPSV cm/s60EDV cm/s10Antegrade +---------+--------+--+--------+--+---------+   Summary: Right Carotid: Velocities in the right ICA are consistent with a 1-39% stenosis. Left Carotid: Velocities in the left ICA are consistent with a 1-39% stenosis. Vertebrals: Bilateral vertebral arteries demonstrate antegrade flow. *See table(s) above for measurements and observations.     Preliminary         Scheduled Meds: . allopurinol  200 mg Oral Daily  . amLODipine  10 mg Oral Daily  . carvedilol  3.125 mg Oral BID WC  . cyanocobalamin  1,000 mcg Intramuscular Daily  . hydrALAZINE   50 mg Oral Q8H  . pantoprazole (PROTONIX) IV  40 mg Intravenous Q12H  . simvastatin  40 mg Oral Daily  . tamsulosin  0.4 mg Oral Daily   Continuous Infusions: . sodium chloride       LOS: 3 days    Time spent: 35 minutes    Jocelynne Duquette A Trannie Bardales, MD [facility]   If 7PM-7AM, please contact night-coverage [email or web address]  08/02/2020, 7:22 AM

## 2020-08-02 NOTE — H&P (View-Only) (Signed)
Progress Note  Chief Complaint:  anemia       ASSESSMENT / PLAN:    Brief history: 87 yo male with CKD, HTN, gout admitted with chest pain / rib cage pain, hypertensive urgency, facial numbness. Found to have new anemia, FOBT+. EGD / colonoscopy put on hold until CVA evaluated. MRI showed multiple small strokes.   # Iron deficiency anemia and FOBT+. Hgb 6.7, down from baseline of 10-11. Described melenic stools for a couple of weeks in early January. Chronic NSAID use.   --Hgb 9.0 after unit of blood --Plan for EGD / colonoscopy tomorrow.  The risks and benefits of EGD and colonoscopy with possible polypectomy / biopsies were discussed and the patient agrees to proceed.   # Stroke. MRI shows small acute left frontal, left occipital and right periatrial white matter infarcts. Daily baby asa recommended, TRH waiting until after GI workup to start it.  --Neurology has cleared him for endoscopic evaluation  # Cholelithiasis       [facility] Attending   I have taken an interval history, reviewed the chart and examined the patient. I agree with the Advanced Practitioner's note, impression and recommendations.     EGD/colonoscopy for heme + iron def anemia tomorrow.  Gatha Mayer, MD, [facility] 08/02/2020 5:49 PM       SUBJECTIVE:   No complaints. Has noticed very faint red blood after BM. Ready to get endoscopic workup done and go home    OBJECTIVE:    Scheduled inpatient medications:  . allopurinol  200 mg Oral Daily  . amLODipine  10 mg Oral Daily  . carvedilol  3.125 mg Oral BID WC  . cyanocobalamin  1,000 mcg Intramuscular Daily  . hydrALAZINE  50 mg Oral Q8H  . pantoprazole (PROTONIX) IV  40 mg Intravenous Q12H  . simvastatin  40 mg Oral Daily  . tamsulosin  0.4 mg Oral Daily   Continuous inpatient infusions:  . sodium chloride     PRN inpatient medications: acetaminophen, hydrALAZINE, polyvinyl alcohol  Vital signs in last 24  hours: Temp:  [97.7 F (36.5 C)-98.6 F (37 C)] 98.2 F (36.8 C) (03/01 0745) Pulse Rate:  [59-77] 72 (03/01 0745) Resp:  [19-25] 25 (03/01 0745) BP: (135-173)/(54-98) 135/58 (03/01 0745) SpO2:  [94 %-99 %] 94 % (03/01 0745) Last BM Date: 08/01/20  Intake/Output Summary (Last 24 hours) at 08/02/2020 1023 Last data filed at 08/02/2020 0746 Gross per 24 hour  Intake 1130 ml  Output --  Net 1130 ml     Physical Exam:  . General: Alert male in NAD . Heart:  Regular rate and rhythm. No lower extremity edema . Pulmonary: Normal respiratory effort . Abdomen: Soft, nondistended, nontender. Normal bowel sounds.  . Neurologic: Alert and oriented . Psych: Pleasant. Cooperative.   Filed Weights   07/29/20 2030  Weight: 77 kg    Intake/Output from previous day: 02/28 0701 - 03/01 0700 In: 1130 [P.O.:660; Blood:470] Out: 200 [Urine:200] Intake/Output this shift: Total I/O In: 240 [P.O.:240] Out: -     Lab Results: Recent Labs    08/01/20 1321 08/02/20 0109 08/02/20 0830  WBC 7.3  --  8.0  HGB 8.4* 7.6* 9.0*  HCT 27.3* 25.1* 28.9*  PLT 201  --  211   BMET Recent Labs    07/31/20 0127 08/01/20 1321  NA 137 136  K 4.1 4.1  CL 103 105  CO2 23 22  GLUCOSE 111* 100*  BUN 25*  32*  CREATININE 2.05* 2.34*  CALCIUM 9.2 8.8*   LFT No results for input(s): PROT, ALBUMIN, AST, ALT, ALKPHOS, BILITOT, BILIDIR, IBILI in the last 72 hours. PT/INR No results for input(s): LABPROT, INR in the last 72 hours. Hepatitis Panel No results for input(s): HEPBSAG, HCVAB, HEPAIGM, HEPBIGM in the last 72 hours.  MR ANGIO HEAD WO CONTRAST  Result Date: 07/31/2020 CLINICAL DATA:  Left-sided numbness. Bilateral infarction shown by MRI. EXAM: MRA HEAD WITHOUT CONTRAST TECHNIQUE: Angiographic images of the Circle of Willis were obtained using MRA technique without intravenous contrast. COMPARISON:  MRI same day FINDINGS: The study is somewhat hindered by the presence of T1 shortening age  in, probably 16. Both internal carotid arteries appear widely patent through the skull base and siphon regions. The anterior and middle cerebral vessels are patent without proximal stenosis, aneurysm or vascular malformation. No sign of large or medium vessel occlusion. Both vertebral arteries are widely patent to the basilar. No basilar stenosis. Posterior circulation branch vessels appear normal. IMPRESSION: Artifact related to intravascular T1 shortening agent, probably Feraheme or similar. Normal intracranial MR angiography of the large and medium size vessels allowing for that. Electronically Signed   By: Nelson Chimes M.D.   On: 07/31/2020 18:59   MR BRAIN WO CONTRAST  Result Date: 07/31/2020 CLINICAL DATA:  Neuro deficit, acute, stroke suspected EXAM: MRI HEAD WITHOUT CONTRAST TECHNIQUE: Multiplanar, multiecho pulse sequences of the brain and surrounding structures were obtained without intravenous contrast. COMPARISON:  07/29/2020. FINDINGS: Please note some image sequences are mildly degraded by motion artifact. Brain: Scattered small acute infarcts involving the left frontal lobe, left occipital cortex and right periatrial white matter. Query late subacute/early chronic right corona radiata and left frontal white matter insults. Mild cerebral atrophy with ex vacuo dilatation. Mild chronic microvascular ischemic changes. No intracranial hemorrhage. Chronic right thalamic insult. No midline shift, ventriculomegaly or extra-axial fluid collection. No mass lesion. Vascular: Normal flow voids. Skull and upper cervical spine: Normal marrow signal. Sinuses/Orbits: Normal orbits. Clear paranasal sinuses. No mastoid effusion. Other: Sequela of recent Feraheme transfusion limits evaluation on SWI. IMPRESSION: Small acute left frontal, left occipital and right periatrial white matter infarcts. Query late subacute/early chronic right corona radiata and left frontal white matter insult. Chronic right thalamic  insult. Mild chronic microvascular ischemic changes. These results were called by telephone at the time of interpretation on 07/31/2020 at 3:24 pm to provider [facility] , who verbally acknowledged these results. Electronically Signed   By: Primitivo Gauze M.D.   On: 07/31/2020 15:28   VAS US CAROTID  Result Date: 08/01/2020 Carotid Arterial Duplex Study Indications:       CVA. Risk Factors:      Hypertension, hyperlipidemia. Comparison Study:  no prior Performing Technologist: Abram Sander RVS  Examination Guidelines: A complete evaluation includes B-mode imaging, spectral Doppler, color Doppler, and power Doppler as needed of all accessible portions of each vessel. Bilateral testing is considered an integral part of a complete examination. Limited examinations for reoccurring indications may be performed as noted.  Right Carotid Findings: +----------+--------+--------+--------+------------------+--------+           PSV cm/sEDV cm/sStenosisPlaque DescriptionComments +----------+--------+--------+--------+------------------+--------+ CCA Prox  73      7               heterogenous               +----------+--------+--------+--------+------------------+--------+ CCA Distal68      7  heterogenous               +----------+--------+--------+--------+------------------+--------+ ICA Prox  69      15      1-39%   heterogenous               +----------+--------+--------+--------+------------------+--------+ ICA Distal64      19                                         +----------+--------+--------+--------+------------------+--------+ ECA       135     4                                          +----------+--------+--------+--------+------------------+--------+ +----------+--------+-------+--------+-------------------+           PSV cm/sEDV cmsDescribeArm Pressure (mmHG) +----------+--------+-------+--------+-------------------+ Subclavian89                                          +----------+--------+-------+--------+-------------------+ +---------+--------+--+--------+-+---------+ VertebralPSV cm/s53EDV cm/s9Antegrade +---------+--------+--+--------+-+---------+  Left Carotid Findings: +----------+--------+--------+--------+------------------+--------+           PSV cm/sEDV cm/sStenosisPlaque DescriptionComments +----------+--------+--------+--------+------------------+--------+ CCA Prox  70      12              heterogenous               +----------+--------+--------+--------+------------------+--------+ CCA Distal83      11              heterogenous               +----------+--------+--------+--------+------------------+--------+ ICA Prox  70      19      1-39%   heterogenous               +----------+--------+--------+--------+------------------+--------+ ICA Distal88      22                                         +----------+--------+--------+--------+------------------+--------+ ECA       182                                                +----------+--------+--------+--------+------------------+--------+ +----------+--------+--------+--------+-------------------+           PSV cm/sEDV cm/sDescribeArm Pressure (mmHG) +----------+--------+--------+--------+-------------------+ [id number]                                          +----------+--------+--------+--------+-------------------+ +---------+--------+--+--------+--+---------+ VertebralPSV cm/s60EDV cm/s10Antegrade +---------+--------+--+--------+--+---------+   Summary: Right Carotid: Velocities in the right ICA are consistent with a 1-39% stenosis. Left Carotid: Velocities in the left ICA are consistent with a 1-39% stenosis. Vertebrals: Bilateral vertebral arteries demonstrate antegrade flow. *See table(s) above for measurements and observations.     Preliminary      Active Problems:   Essential hypertension   HTN  (hypertension), malignant   Iron deficiency anemia   Abnormal CT of liver   Melena  Cerebral embolism with cerebral infarction     LOS: 3 days   Tye Savoy ,NP 08/02/2020, 10:23 AM

## 2020-08-02 NOTE — Progress Notes (Addendum)
Progress Note  Chief Complaint:  anemia       ASSESSMENT / PLAN:    Brief history: 87 yo male with CKD, HTN, gout admitted with chest pain / rib cage pain, hypertensive urgency, facial numbness. Found to have new anemia, FOBT+. EGD / colonoscopy put on hold until CVA evaluated. MRI showed multiple small strokes.   # Iron deficiency anemia and FOBT+. Hgb 6.7, down from baseline of 10-11. Described melenic stools for a couple of weeks in early January. Chronic NSAID use.   --Hgb 9.0 after unit of blood --Plan for EGD / colonoscopy tomorrow.  The risks and benefits of EGD and colonoscopy with possible polypectomy / biopsies were discussed and the patient agrees to proceed.   # Stroke. MRI shows small acute left frontal, left occipital and right periatrial white matter infarcts. Daily baby asa recommended, TRH waiting until after GI workup to start it.  --Neurology has cleared him for endoscopic evaluation  # Cholelithiasis       [facility] Attending   I have taken an interval history, reviewed the chart and examined the patient. I agree with the Advanced Practitioner's note, impression and recommendations.     EGD/colonoscopy for heme + iron def anemia tomorrow.  Gatha Mayer, MD, [facility] 08/02/2020 5:49 PM       SUBJECTIVE:   No complaints. Has noticed very faint red blood after BM. Ready to get endoscopic workup done and go home    OBJECTIVE:    Scheduled inpatient medications:  . allopurinol  200 mg Oral Daily  . amLODipine  10 mg Oral Daily  . carvedilol  3.125 mg Oral BID WC  . cyanocobalamin  1,000 mcg Intramuscular Daily  . hydrALAZINE  50 mg Oral Q8H  . pantoprazole (PROTONIX) IV  40 mg Intravenous Q12H  . simvastatin  40 mg Oral Daily  . tamsulosin  0.4 mg Oral Daily   Continuous inpatient infusions:  . sodium chloride     PRN inpatient medications: acetaminophen, hydrALAZINE, polyvinyl alcohol  Vital signs in last 24  hours: Temp:  [97.7 F (36.5 C)-98.6 F (37 C)] 98.2 F (36.8 C) (03/01 0745) Pulse Rate:  [59-77] 72 (03/01 0745) Resp:  [19-25] 25 (03/01 0745) BP: (135-173)/(54-98) 135/58 (03/01 0745) SpO2:  [94 %-99 %] 94 % (03/01 0745) Last BM Date: 08/01/20  Intake/Output Summary (Last 24 hours) at 08/02/2020 1023 Last data filed at 08/02/2020 0746 Gross per 24 hour  Intake 1130 ml  Output -  Net 1130 ml     Physical Exam:  . General: Alert male in NAD . Heart:  Regular rate and rhythm. No lower extremity edema . Pulmonary: Normal respiratory effort . Abdomen: Soft, nondistended, nontender. Normal bowel sounds.  . Neurologic: Alert and oriented . Psych: Pleasant. Cooperative.   Filed Weights   07/29/20 2030  Weight: 77 kg    Intake/Output from previous day: 02/28 0701 - 03/01 0700 In: 1130 [P.O.:660; Blood:470] Out: 200 [Urine:200] Intake/Output this shift: Total I/O In: 240 [P.O.:240] Out: -     Lab Results: Recent Labs    08/01/20 1321 08/02/20 0109 08/02/20 0830  WBC 7.3  --  8.0  HGB 8.4* 7.6* 9.0*  HCT 27.3* 25.1* 28.9*  PLT 201  --  211   BMET Recent Labs    07/31/20 0127 08/01/20 1321  NA 137 136  K 4.1 4.1  CL 103 105  CO2 23 22  GLUCOSE 111* 100*  BUN 25*  32*  CREATININE 2.05* 2.34*  CALCIUM 9.2 8.8*   LFT No results for input(s): PROT, ALBUMIN, AST, ALT, ALKPHOS, BILITOT, BILIDIR, IBILI in the last 72 hours. PT/INR No results for input(s): LABPROT, INR in the last 72 hours. Hepatitis Panel No results for input(s): HEPBSAG, HCVAB, HEPAIGM, HEPBIGM in the last 72 hours.  MR ANGIO HEAD WO CONTRAST  Result Date: 07/31/2020 CLINICAL DATA:  Left-sided numbness. Bilateral infarction shown by MRI. EXAM: MRA HEAD WITHOUT CONTRAST TECHNIQUE: Angiographic images of the Circle of Willis were obtained using MRA technique without intravenous contrast. COMPARISON:  MRI same day FINDINGS: The study is somewhat hindered by the presence of T1 shortening age in,  probably 5. Both internal carotid arteries appear widely patent through the skull base and siphon regions. The anterior and middle cerebral vessels are patent without proximal stenosis, aneurysm or vascular malformation. No sign of large or medium vessel occlusion. Both vertebral arteries are widely patent to the basilar. No basilar stenosis. Posterior circulation branch vessels appear normal. IMPRESSION: Artifact related to intravascular T1 shortening agent, probably Feraheme or similar. Normal intracranial MR angiography of the large and medium size vessels allowing for that. Electronically Signed   By: Nelson Chimes M.D.   On: 07/31/2020 18:59   MR BRAIN WO CONTRAST  Result Date: 07/31/2020 CLINICAL DATA:  Neuro deficit, acute, stroke suspected EXAM: MRI HEAD WITHOUT CONTRAST TECHNIQUE: Multiplanar, multiecho pulse sequences of the brain and surrounding structures were obtained without intravenous contrast. COMPARISON:  07/29/2020. FINDINGS: Please note some image sequences are mildly degraded by motion artifact. Brain: Scattered small acute infarcts involving the left frontal lobe, left occipital cortex and right periatrial white matter. Query late subacute/early chronic right corona radiata and left frontal white matter insults. Mild cerebral atrophy with ex vacuo dilatation. Mild chronic microvascular ischemic changes. No intracranial hemorrhage. Chronic right thalamic insult. No midline shift, ventriculomegaly or extra-axial fluid collection. No mass lesion. Vascular: Normal flow voids. Skull and upper cervical spine: Normal marrow signal. Sinuses/Orbits: Normal orbits. Clear paranasal sinuses. No mastoid effusion. Other: Sequela of recent Feraheme transfusion limits evaluation on SWI. IMPRESSION: Small acute left frontal, left occipital and right periatrial white matter infarcts. Query late subacute/early chronic right corona radiata and left frontal white matter insult. Chronic right thalamic  insult. Mild chronic microvascular ischemic changes. These results were called by telephone at the time of interpretation on 07/31/2020 at 3:24 pm to provider [facility] , who verbally acknowledged these results. Electronically Signed   By: Primitivo Gauze M.D.   On: 07/31/2020 15:28   VAS US CAROTID  Result Date: 08/01/2020 Carotid Arterial Duplex Study Indications:       CVA. Risk Factors:      Hypertension, hyperlipidemia. Comparison Study:  no prior Performing Technologist: Abram Sander RVS  Examination Guidelines: A complete evaluation includes B-mode imaging, spectral Doppler, color Doppler, and power Doppler as needed of all accessible portions of each vessel. Bilateral testing is considered an integral part of a complete examination. Limited examinations for reoccurring indications may be performed as noted.  Right Carotid Findings: +----------+--------+--------+--------+------------------+--------+           PSV cm/sEDV cm/sStenosisPlaque DescriptionComments +----------+--------+--------+--------+------------------+--------+ CCA Prox  73      7               heterogenous               +----------+--------+--------+--------+------------------+--------+ CCA Distal68      7  heterogenous               +----------+--------+--------+--------+------------------+--------+ ICA Prox  69      15      1-39%   heterogenous               +----------+--------+--------+--------+------------------+--------+ ICA Distal64      19                                         +----------+--------+--------+--------+------------------+--------+ ECA       135     4                                          +----------+--------+--------+--------+------------------+--------+ +----------+--------+-------+--------+-------------------+           PSV cm/sEDV cmsDescribeArm Pressure (mmHG) +----------+--------+-------+--------+-------------------+ Subclavian89                                          +----------+--------+-------+--------+-------------------+ +---------+--------+--+--------+-+---------+ VertebralPSV cm/s53EDV cm/s9Antegrade +---------+--------+--+--------+-+---------+  Left Carotid Findings: +----------+--------+--------+--------+------------------+--------+           PSV cm/sEDV cm/sStenosisPlaque DescriptionComments +----------+--------+--------+--------+------------------+--------+ CCA Prox  70      12              heterogenous               +----------+--------+--------+--------+------------------+--------+ CCA Distal83      11              heterogenous               +----------+--------+--------+--------+------------------+--------+ ICA Prox  70      19      1-39%   heterogenous               +----------+--------+--------+--------+------------------+--------+ ICA Distal88      22                                         +----------+--------+--------+--------+------------------+--------+ ECA       182                                                +----------+--------+--------+--------+------------------+--------+ +----------+--------+--------+--------+-------------------+           PSV cm/sEDV cm/sDescribeArm Pressure (mmHG) +----------+--------+--------+--------+-------------------+ [id number]                                          +----------+--------+--------+--------+-------------------+ +---------+--------+--+--------+--+---------+ VertebralPSV cm/s60EDV cm/s10Antegrade +---------+--------+--+--------+--+---------+   Summary: Right Carotid: Velocities in the right ICA are consistent with a 1-39% stenosis. Left Carotid: Velocities in the left ICA are consistent with a 1-39% stenosis. Vertebrals: Bilateral vertebral arteries demonstrate antegrade flow. *See table(s) above for measurements and observations.     Preliminary      Active Problems:   Essential hypertension   HTN  (hypertension), malignant   Iron deficiency anemia   Abnormal CT of liver   Melena  Cerebral embolism with cerebral infarction     LOS: 3 days   Tye Savoy ,NP 08/02/2020, 10:23 AM

## 2020-08-02 NOTE — Progress Notes (Signed)
STROKE TEAM PROGRESS NOTE   INTERVAL HISTORY He is walking in his room.  Patient states he still has numbness around his face and his left hand continues to improve but still persistent.  Vital signs are stable.  GI planning to do colonoscopy tomorrow.  Vital signs stable.  No new complaints. Vitals:   08/02/20 0608 08/02/20 0612 08/02/20 0745 08/02/20 1036  BP: (!) 173/66 (!) 156/67 (!) 135/58 (!) 173/75  Pulse:   72 70  Resp:   (!) 25 17  Temp:   98.2 F (36.8 C) 98.4 F (36.9 C)  TempSrc:   Oral Oral  SpO2:   94% 99%  Weight:      Height:       CBC:  Recent Labs  Lab 07/29/20 1204 07/29/20 1248 08/01/20 1321 08/02/20 0109 08/02/20 0830  WBC 6.1   < > 7.3  --  8.0  NEUTROABS 4.6  --   --   --   --   HGB 8.3*   < > 8.4* 7.6* 9.0*  HCT 27.8*   < > 27.3* 25.1* 28.9*  MCV 74.5*   < > 73.2*  --  73.4*  PLT 235   < > 201  --  211   < > = values in this interval not displayed.   Basic Metabolic Panel:  Recent Labs  Lab 08/01/20 1321 08/02/20 0830  NA 136 135  K 4.1 3.9  CL 105 102  CO2 22 21*  GLUCOSE 100* 124*  BUN 32* 34*  CREATININE 2.34* 2.21*  CALCIUM 8.8* 9.2   Lipid Panel:  Recent Labs  Lab 08/01/20 0128  CHOL 187  TRIG 94  HDL 34*  CHOLHDL 5.5  VLDL 19  LDLCALC 134*   HgbA1c:  Recent Labs  Lab 08/01/20 0128  HGBA1C 5.9*   Urine Drug Screen: No results for input(s): LABOPIA, COCAINSCRNUR, LABBENZ, AMPHETMU, THCU, LABBARB in the last 168 hours.  Alcohol Level No results for input(s): ETH in the last 168 hours.  IMAGING past 24 hours MR ANGIO HEAD WO CONTRAST  Result Date: 07/31/2020 IMPRESSION: Artifact related to intravascular T1 shortening agent, probably Feraheme or similar. Normal intracranial MR angiography of the large and medium size vessels allowing for that.   MR BRAIN WO CONTRAST  Result Date: 07/31/2020 IMPRESSION: Small acute left frontal, left occipital and right periatrial white matter infarcts. Query late subacute/early chronic  right corona radiata and left frontal white matter insult. Chronic right thalamic insult. Mild chronic microvascular ischemic changes.   CT head Result Date: 07/29/2020 Mild diffuse cortical atrophy. Mild chronic ischemic white matter disease. No acute intracranial abnormality seen.  PHYSICAL EXAM Pleasant frail elderly African-American male not in distress. . Afebrile. Head is nontraumatic. Neck is supple without bruit.    Cardiac exam no murmur or gallop. Lungs are clear to auscultation. Distal pulses are well felt. Neurological Exam ;  Awake  Alert oriented x 3. Normal speech and language.eye movements full without nystagmus.fundi were not visualized. Vision acuity and fields appear normal. Hearing is normal. Palatal movements are normal. Face symmetric. Tongue midline. Normal strength, tone, reflexes and coordination.  Diminished perioral and left hand touch pinprick sensation. Gait deferred. ASSESSMENT/PLAN [name] is a 87 y.o. male with history of coronary artery disease, hypertension noncompliant to medications, CKD 3, hyperlipidemia, tobacco abuse, admitted to the hospital since 07/29/2020, for evaluation of complaints of chest pain and rib pain for about 1 month.  He developed left facial and hand  numbness a few weeks ago.  An MRI of the brain which showed small acute left frontal, left occipital and right parietal white matter infarcts and a question of possible late subacute or early chronic right corona radiata and left frontal white matter infarcts along with chronic right thalamic insults. LDL 134, HgbA1c 5.9  Stroke:  multiple lacunar strokes (left frontal, occipital and parietal white matter infarcts) likely secondary to cardioembolic versus small vessel disease   CT head No acute abnormality. Mild diffuse cortical atrophy.   MRA head:  Normal intracranial MR  MRI brain: Small acute left frontal, left occipital and right periatrial white matter infarcts. subacute/early  chronic right corona radiata and left frontal white matter insult. Chronic right thalamic insult  Carotid Doppler  Left and right ICA 1-39% stenosis  2D Echo: EF 60-65%, LVH, Grade 2 diastolic dysfunction  LDL 134  HgbA1c 5.9  VTE prophylaxis - holding 2/2 anemia, SCDs for now  No antithrombotic prior to admission, now on No antithrombotic due to acute anemia/GI bleed, recommend enteric-coated aspirin 81mg  once GI medicine deems safe from GI/anemia stand point  Therapy recommendations:  pending  Disposition:  pending  Hypertension, Emergency  Admission BP 240/100  Home meds:  Not currently taking any medication  Unstable  Currently on Norvasc 10mg  daily, coreg 3.125mg  bid, Hydralazine 50mg  q 8hr . Long-term BP goal normotensive  Hyperlipidemia  Home meds:  Does not take medication  Add simvastatin 40 mg daily  LDL 134, goal < 70  Continue statin at discharge   Anemia, Iron deficiency/GI bleed  Chronic NSAID use  FOBT positive stool  Serial H/H checks  EGD and colonoscopy pending  Continuous PPI  Received IV iron 2/26  PRBC 1 unit given  hgb 8.5 -> 6.7 -> 8.4  Tobacco Abuse  Advised to stop smoking  Nicotine patch  Other Stroke Risk Factors  Advanced Age >/= 34   Cigarette smoker will advised to stop smoking  Coronary artery disease  Other Active Problems  Chronic Kidney disease, stage IIIb  Baseline 1.6 -2.1  Creatinine on admission 1.8, current 2.34  Flomax  Strict In/outs  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  Can defer to primary hospitalist service inpatient versus outpatient work-up  Pulmonary nodule  Left lower lobe 2.2 cm, part solid nodule; suspicious for low grade indolent pulmonary neoplasm such as adenocarcinoma.    pulmonologist outpatient   Hospital day # 3    He has presented with sudden onset of perioral and left hand paresthesias likely from his possible lacunar  concurrent infarcts from small vessel disease.  Patient had recent GI bleeding requiring iron transfusion.  Hence recommend to not do dual antiplatelet therapy and consider only coated 81 mg aspirin if okay from GI standpoint after colonoscopy tomorrow..  Aggressive risk factor modification.  Discussed with Dr.Regalado  Greater than 50% time during this 25-minute visit was spent on counseling and coordination of care about his lacunar strokes and answering questions about stroke prevention treatment Antony Contras, MD Medical Director [place] Pager: [phone] 08/02/2020 1:09 PM   To contact Stroke Continuity provider, please refer to [email or web address] After hours, contact General Neurology

## 2020-08-02 NOTE — Care Management Important Message (Signed)
Important Message  Patient Details  Name: Isaac Phelps MRN: [id number] Date of Birth: 03-08-1931   Medicare Important Message Given:  Yes     [name] 08/02/2020, 3:36 PM

## 2020-08-03 ENCOUNTER — Inpatient Hospital Stay (HOSPITAL_COMMUNITY): Payer: Medicare Other | Admitting: Anesthesiology

## 2020-08-03 ENCOUNTER — Encounter (HOSPITAL_COMMUNITY)
Admission: EM | Disposition: A | Discharge status: Home / Self Care | Payer: Self-pay | Source: Home / Self Care | Attending: Internal Medicine

## 2020-08-03 ENCOUNTER — Encounter (HOSPITAL_COMMUNITY): Payer: Self-pay | Admitting: Internal Medicine

## 2020-08-03 DIAGNOSIS — D490 Neoplasm of unspecified behavior of digestive system: Secondary | ICD-10-CM

## 2020-08-03 DIAGNOSIS — C20 Malignant neoplasm of rectum: Secondary | ICD-10-CM | POA: Diagnosis present

## 2020-08-03 DIAGNOSIS — K6289 Other specified diseases of anus and rectum: Secondary | ICD-10-CM

## 2020-08-03 HISTORY — PX: COLONOSCOPY WITH PROPOFOL: SHX5780

## 2020-08-03 HISTORY — PX: BIOPSY: SHX5522

## 2020-08-03 HISTORY — PX: ESOPHAGOGASTRODUODENOSCOPY (EGD) WITH PROPOFOL: SHX5813

## 2020-08-03 LAB — CBC
HCT: 27.9 % — ABNORMAL LOW (ref 39.0–52.0)
Hemoglobin: 8.4 g/dL — ABNORMAL LOW (ref 13.0–17.0)
MCH: 22.3 pg — ABNORMAL LOW (ref 26.0–34.0)
MCHC: 30.1 g/dL (ref 30.0–36.0)
MCV: 74.2 fL — ABNORMAL LOW (ref 80.0–100.0)
Platelets: 205 10*3/uL (ref 150–400)
RBC: 3.76 MIL/uL — ABNORMAL LOW (ref 4.22–5.81)
RDW: 18 % — ABNORMAL HIGH (ref 11.5–15.5)
WBC: 6 10*3/uL (ref 4.0–10.5)
nRBC: 0 % (ref 0.0–0.2)

## 2020-08-03 LAB — BASIC METABOLIC PANEL
Anion gap: 10 (ref 5–15)
BUN: 28 mg/dL — ABNORMAL HIGH (ref 8–23)
CO2: 22 mmol/L (ref 22–32)
Calcium: 8.9 mg/dL (ref 8.9–10.3)
Chloride: 108 mmol/L (ref 98–111)
Creatinine, Ser: 1.83 mg/dL — ABNORMAL HIGH (ref 0.61–1.24)
GFR, Estimated: 35 mL/min — ABNORMAL LOW (ref >60)
Glucose, Bld: 103 mg/dL — ABNORMAL HIGH (ref 70–99)
Potassium: 3.8 mmol/L (ref 3.5–5.1)
Sodium: 140 mmol/L (ref 135–145)

## 2020-08-03 LAB — HEMOGLOBIN AND HEMATOCRIT, BLOOD
HCT: 29.9 % — ABNORMAL LOW (ref 39.0–52.0)
Hemoglobin: 9 g/dL — ABNORMAL LOW (ref 13.0–17.0)

## 2020-08-03 SURGERY — COLONOSCOPY WITH PROPOFOL
Anesthesia: Monitor Anesthesia Care

## 2020-08-03 MED ORDER — PHENYLEPHRINE 40 MCG/ML (10ML) SYRINGE FOR IV PUSH (FOR BLOOD PRESSURE SUPPORT)
PREFILLED_SYRINGE | INTRAVENOUS | Status: DC | PRN
  Administered 2020-08-03 (×2): 80 ug via INTRAVENOUS

## 2020-08-03 MED ORDER — HYDRALAZINE HCL 20 MG/ML IJ SOLN
INTRAMUSCULAR | Status: AC
  Filled 2020-08-03: qty 1

## 2020-08-03 MED ORDER — HYDRALAZINE HCL 20 MG/ML IJ SOLN
5.0000 mg | Freq: Once | INTRAMUSCULAR | Status: AC
  Administered 2020-08-03: 5 mg via INTRAVENOUS

## 2020-08-03 MED ORDER — LACTATED RINGERS IV SOLN: INTRAVENOUS | Status: DC | PRN

## 2020-08-03 MED ORDER — PROPOFOL 500 MG/50ML IV EMUL
INTRAVENOUS | Status: DC | PRN
  Administered 2020-08-03: 75 ug/kg/min via INTRAVENOUS
  Administered 2020-08-03: 175 ug/kg/min via INTRAVENOUS

## 2020-08-03 MED ORDER — LIDOCAINE 5 % EX PTCH
1.0000 | MEDICATED_PATCH | CUTANEOUS | Status: DC
  Administered 2020-08-03 – 2020-08-04 (×2): 1 via TRANSDERMAL
  Filled 2020-08-03 (×2): qty 1

## 2020-08-03 MED ORDER — PROPOFOL 10 MG/ML IV BOLUS
INTRAVENOUS | Status: DC | PRN
  Administered 2020-08-03 (×3): 30 mg via INTRAVENOUS

## 2020-08-03 SURGICAL SUPPLY — 25 items

## 2020-08-03 NOTE — Progress Notes (Signed)
STROKE TEAM PROGRESS NOTE   INTERVAL HISTORY He is sitting up in a bedside chair.  Patient states he still has numbness around his face and his left hand but not bothersome.  Vital signs are stable.  GI planning to do colonoscopy today.  Vital signs stable.  No new complaints. Vitals:   08/03/20 1117 08/03/20 1247 08/03/20 1256 08/03/20 1315  BP: (!) 168/60 (!) 209/69 (!) 205/65 (!) 198/63  Pulse:  66 64 68  Resp: (!) 22 19 (!) 26 (!) 26  Temp: 98.7 F (37.1 C) 99.3 F (37.4 C)    TempSrc: Oral Temporal    SpO2: 94% 98% 98% 99%  Weight:  77 kg    Height:  5\' 11"  (1.803 m)     CBC:  Recent Labs  Lab 07/29/20 1204 07/29/20 1248 08/02/20 0830 08/02/20 1331 08/03/20 0546  WBC 6.1   < > 8.0  --  6.0  NEUTROABS 4.6  --   --   --   --   HGB 8.3*   < > 9.0* 8.7* 8.4*  HCT 27.8*   < > 28.9* 27.7* 27.9*  MCV 74.5*   < > 73.4*  --  74.2*  PLT 235   < > 211  --  205   < > = values in this interval not displayed.   Basic Metabolic Panel:  Recent Labs  Lab 08/02/20 0830 08/03/20 0546  NA 135 140  K 3.9 3.8  CL 102 108  CO2 21* 22  GLUCOSE 124* 103*  BUN 34* 28*  CREATININE 2.21* 1.83*  CALCIUM 9.2 8.9   Lipid Panel:  Recent Labs  Lab 08/01/20 0128  CHOL 187  TRIG 94  HDL 34*  CHOLHDL 5.5  VLDL 19  LDLCALC 134*   HgbA1c:  Recent Labs  Lab 08/01/20 0128  HGBA1C 5.9*   Urine Drug Screen: No results for input(s): LABOPIA, COCAINSCRNUR, LABBENZ, AMPHETMU, THCU, LABBARB in the last 168 hours.  Alcohol Level No results for input(s): ETH in the last 168 hours.  IMAGING past 24 hours MR ANGIO HEAD WO CONTRAST  Result Date: 07/31/2020 IMPRESSION: Artifact related to intravascular T1 shortening agent, probably Feraheme or similar. Normal intracranial MR angiography of the large and medium size vessels allowing for that.   MR BRAIN WO CONTRAST  Result Date: 07/31/2020 IMPRESSION: Small acute left frontal, left occipital and right periatrial white matter infarcts. Query  late subacute/early chronic right corona radiata and left frontal white matter insult. Chronic right thalamic insult. Mild chronic microvascular ischemic changes.   CT head Result Date: 07/29/2020 Mild diffuse cortical atrophy. Mild chronic ischemic white matter disease. No acute intracranial abnormality seen.  PHYSICAL EXAM Pleasant frail elderly African-American male not in distress. . Afebrile. Head is nontraumatic. Neck is supple without bruit.    Cardiac exam no murmur or gallop. Lungs are clear to auscultation. Distal pulses are well felt. Neurological Exam ;  Awake  Alert oriented x 3. Normal speech and language.eye movements full without nystagmus.fundi were not visualized. Vision acuity and fields appear normal. Hearing is normal. Palatal movements are normal. Face symmetric. Tongue midline. Normal strength, tone, reflexes and coordination.  Diminished perioral and left hand touch pinprick sensation. Gait deferred. ASSESSMENT/PLAN Isaac Phelps is a 87 y.o. male with history of coronary artery disease, hypertension noncompliant to medications, CKD 3, hyperlipidemia, tobacco abuse, admitted to the hospital since 07/29/2020, for evaluation of complaints of chest pain and rib pain for about 1 month.  He  developed left facial and hand numbness a few weeks ago.  An MRI of the brain which showed small acute left frontal, left occipital and right parietal white matter infarcts and a question of possible late subacute or early chronic right corona radiata and left frontal white matter infarcts along with chronic right thalamic insults. LDL 134, HgbA1c 5.9  Stroke:  multiple lacunar strokes (left frontal, occipital and parietal white matter infarcts) likely secondary to cardioembolic versus small vessel disease   CT head No acute abnormality. Mild diffuse cortical atrophy.   MRA head:  Normal intracranial MR  MRI brain: Small acute left frontal, left occipital and right periatrial white matter  infarcts. subacute/early chronic right corona radiata and left frontal white matter insult. Chronic right thalamic insult  Carotid Doppler  Left and right ICA 1-39% stenosis  2D Echo: EF 60-65%, LVH, Grade 2 diastolic dysfunction  LDL 134  HgbA1c 5.9  VTE prophylaxis - holding 2/2 anemia, SCDs for now  No antithrombotic prior to admission, now on No antithrombotic due to acute anemia/GI bleed, recommend enteric-coated aspirin 81mg  once GI medicine deems safe from GI/anemia stand point  Therapy recommendations:  pending  Disposition:  pending  Hypertension, Emergency  Admission BP 240/100  Home meds:  Not currently taking any medication  Unstable  Currently on Norvasc 10mg  daily, coreg 3.125mg  bid, Hydralazine 50mg  q 8hr . Long-term BP goal normotensive  Hyperlipidemia  Home meds:  Does not take medication  Add simvastatin 40 mg daily  LDL 134, goal < 70  Continue statin at discharge   Anemia, Iron deficiency/GI bleed  Chronic NSAID use  FOBT positive stool  Serial H/H checks  EGD and colonoscopy pending  Continuous PPI  Received IV iron 2/26  PRBC 1 unit given  hgb 8.5 -> 6.7 -> 8.4  Tobacco Abuse  Advised to stop smoking  Nicotine patch  Other Stroke Risk Factors  Advanced Age >/= 32   Cigarette smoker will advised to stop smoking  Coronary artery disease  Other Active Problems  Chronic Kidney disease, stage IIIb  Baseline 1.6 -2.1  Creatinine on admission 1.8, current 2.34  Flomax  Strict In/outs  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  Can defer to primary hospitalist service inpatient versus outpatient work-up  Pulmonary nodule  Left lower lobe 2.2 cm, part solid nodule; suspicious for low grade indolent pulmonary neoplasm such as adenocarcinoma.    pulmonologist outpatient   Hospital day # 4    He has presented with sudden onset of perioral and left hand paresthesias likely from his  possible lacunar concurrent infarcts from small vessel disease.  Patient had recent GI bleeding requiring iron transfusion.  Hence recommend to not do dual antiplatelet therapy and consider only coated 81 mg aspirin if okay from GI standpoint after colonoscopy tomorrow..  Aggressive risk factor modification.  Await colonoscopy today and decision for restarting antiplatelet therapy will depend upon the results of colonoscopy  Antony Contras, MD Medical Director [place] Pager: [phone] 08/03/2020 1:25 PM   To contact Stroke Continuity provider, please refer to [email or web address] After hours, contact General Neurology

## 2020-08-03 NOTE — Op Note (Signed)
[facility] Patient Name: Isaac Phelps Procedure Date : 08/03/2020 MRN: [id number] Attending MD: Gatha Mayer , MD Date of Birth: 12-30-1930 CSN: [id number] Age: 87 Admit Type: Inpatient Procedure:                Upper GI endoscopy Indications:              Iron deficiency anemia Providers:                Gatha Mayer, MD, Particia Nearing, RN, Tyrone Apple, Technician, Vickii Penna, CRNA Referring MD:              Medicines:                Propofol per Anesthesia, Monitored Anesthesia Care Complications:            No immediate complications. Estimated Blood Loss:     Estimated blood loss: none. Procedure:                Pre-Anesthesia Assessment:                           - Prior to the procedure, a History and Physical                            was performed, and patient medications and                            allergies were reviewed. The patient's tolerance of                            previous anesthesia was also reviewed. The risks                            and benefits of the procedure and the sedation                            options and risks were discussed with the patient.                            All questions were answered, and informed consent                            was obtained. Prior Anticoagulants: The patient has                            taken no previous anticoagulant or antiplatelet                            agents. ASA Grade Assessment: III - A patient with                            severe systemic disease. After reviewing the risks  and benefits, the patient was deemed in                            satisfactory condition to undergo the procedure.                           After obtaining informed consent, the endoscope was                            passed under direct vision. Throughout the                            procedure, the patient's blood pressure, pulse, and                             oxygen saturations were monitored continuously. The                            [id number]-[id number] ([id number] Olympus gastroscope was                            introduced through the mouth, and advanced to the                            second part of duodenum. The upper GI endoscopy was                            accomplished without difficulty. The patient                            tolerated the procedure well. Scope In: Scope Out: Findings:      The esophagus was normal.      The stomach was normal.      The examined duodenum was normal.      The cardia and gastric fundus were normal on retroflexion. Impression:               - Normal esophagus.                           - Normal stomach.                           - Normal examined duodenum.                           - No specimens collected. Recommendation:           - See the other procedure note for documentation of                            additional recommendations. Colonoscopy next Procedure Code(s):        --- Professional ---                           [phone], Esophagogastroduodenoscopy, flexible,  transoral; diagnostic, including collection of                            specimen(s) by brushing or washing, when performed                            (separate procedure) Diagnosis Code(s):        --- Professional ---                           D50.9, Iron deficiency anemia, unspecified CPT copyright 2019 American Medical Association. All rights reserved. The codes documented in this report are preliminary and upon coder review may  be revised to meet current compliance requirements. Gatha Mayer, MD 08/03/2020 2:13:40 PM This report has been signed electronically. Number of Addenda: 0

## 2020-08-03 NOTE — Progress Notes (Signed)
Patient arrived in the unit at 2100 from [place], A&Ox4, denied of any discomfort, no s/s distress, initiated telemetry monitor, resting in a bed, all patient necessary items are within reach, and will continue to monitor.

## 2020-08-03 NOTE — Op Note (Signed)
[facility] Patient Name: Isaac Phelps Procedure Date : 08/03/2020 MRN: [id number] Attending MD: Gatha Mayer , MD Date of Birth: Feb 07, 1931 CSN: [id number] Age: 87 Admit Type: Inpatient Procedure:                Colonoscopy Indications:              Iron deficiency anemia Providers:                Gatha Mayer, MD, Particia Nearing, RN, Tyrone Apple, Technician Referring MD:              Medicines:                Propofol per Anesthesia, Monitored Anesthesia Care Complications:            No immediate complications. Estimated Blood Loss:     Estimated blood loss was minimal. Procedure:                Pre-Anesthesia Assessment:                           - Prior to the procedure, a History and Physical                            was performed, and patient medications and                            allergies were reviewed. The patient's tolerance of                            previous anesthesia was also reviewed. The risks                            and benefits of the procedure and the sedation                            options and risks were discussed with the patient.                            All questions were answered, and informed consent                            was obtained. Prior Anticoagulants: The patient has                            taken no previous anticoagulant or antiplatelet                            agents. ASA Grade Assessment: III - A patient with                            severe systemic disease. After reviewing the risks  and benefits, the patient was deemed in                            satisfactory condition to undergo the procedure.                           After obtaining informed consent, the colonoscope                            was passed under direct vision. Throughout the                            procedure, the patient's blood pressure, pulse, and                             oxygen saturations were monitored continuously. The                            [id number]-[id number] ([id number] Olympus colonoscope was                            introduced through the anus and advanced to the the                            cecum, identified by appendiceal orifice and                            ileocecal valve. The colonoscopy was performed                            without difficulty. The patient tolerated the                            procedure well. The quality of the bowel                            preparation was good. The ileocecal valve,                            appendiceal orifice, and rectum were photographed. Scope In: 1:40:23 PM Scope Out: 1:58:28 PM Scope Withdrawal Time: 0 hours 7 minutes 15 seconds  Total Procedure Duration: 0 hours 18 minutes 5 seconds  Findings:      The digital rectal exam findings include palpable rectal mass.      A fungating, polypoid and sessile large mass was found in the distal       rectum. The mass was non-circumferential. The mass measured three cm in       length. Oozing was present. This was biopsied with a cold forceps for       histology. Verification of patient identification for the specimen was       done. Estimated blood loss was minimal.      The exam was otherwise without abnormality on direct and retroflexion       views. Impression:               -  Palpable rectal mass found on digital rectal                            exam. 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 Biopsied.                           - The examination was otherwise normal on direct                            and retroflexion views. Recommendation:           - Return patient to hospital ward for ongoing care.                           - Resume regular diet.                           - Would try to avoid  anticoagulants                           ASA ok but unless strongly indicated no other                            anti-PLT or anti-coags                           He can go home whit this and we can determine next                            stepd after pathology review                           I discussed with patient and called daughter re:                            concerns for malignancy Procedure Code(s):        --- Professional ---                           [phone], Colonoscopy, flexible; with biopsy, single                            or multiple Diagnosis Code(s):        --- Professional ---                           K62.89, Other specified diseases of anus and rectum  D49.0, Neoplasm of unspecified behavior of                            digestive system                           D50.9, Iron deficiency anemia, unspecified CPT copyright 2019 American Medical Association. All rights reserved. The codes documented in this report are preliminary and upon coder review may  be revised to meet current compliance requirements. Gatha Mayer, MD 08/03/2020 2:32:42 PM This report has been signed electronically. Number of Addenda: 0

## 2020-08-03 NOTE — Anesthesia Postprocedure Evaluation (Signed)
Anesthesia Post Note  Patient: Isaac Phelps  Procedure(s) Performed: COLONOSCOPY WITH PROPOFOL (N/A ) ESOPHAGOGASTRODUODENOSCOPY (EGD) WITH PROPOFOL (N/A ) BIOPSY     Patient location during evaluation: Endoscopy Anesthesia Type: MAC Level of consciousness: awake and alert Pain management: pain level controlled Vital Signs Assessment: post-procedure vital signs reviewed and stable Respiratory status: spontaneous breathing, nonlabored ventilation, respiratory function stable and patient connected to nasal cannula oxygen Cardiovascular status: stable and blood pressure returned to baseline Postop Assessment: no apparent nausea or vomiting Anesthetic complications: no   No complications documented.  Last Vitals:  Vitals:   08/03/20 1505 08/03/20 1732  BP: (!) 173/60 (!) 163/64  Pulse:  74  Resp: 20 19  Temp:  36.7 C  SpO2: 97% 97%    Last Pain:  Vitals:   08/03/20 1732  TempSrc: Oral  PainSc: 0-No pain                 Biance Moncrief COKER

## 2020-08-03 NOTE — Anesthesia Preprocedure Evaluation (Addendum)
Anesthesia Evaluation  Patient identified by MRN, date of birth, ID band Patient awake    Reviewed: Allergy & Precautions, NPO status , Patient's Chart, lab work & pertinent test results  Airway Mallampati: I  TM Distance: >3 FB Neck ROM: Full    Dental  (+) Edentulous Lower, Edentulous Upper   Pulmonary Current Smoker and Patient abstained from smoking.,    Pulmonary exam normal breath sounds clear to auscultation       Cardiovascular hypertension, Pt. on medications + CAD and +CHF (grade 2 diastolic dysfunction)  Normal cardiovascular exam Rhythm:Regular Rate:Normal  Echo 07/2020: 1. Left ventricular ejection fraction, by estimation, is 60 to 65%. The  left ventricle has normal function. The left ventricle has no regional  wall motion abnormalities. There is moderate concentric left ventricular  hypertrophy. Left ventricular  diastolic parameters are consistent with Grade II diastolic dysfunction  (pseudonormalization). The global longitudinal strain is normal.  2. Right ventricular systolic function is normal. The right ventricular  size is normal. Mildly increased right ventricular wall thickness.  3. Left atrial size was moderately dilated.  4. The mitral valve is grossly normal. Trivial mitral valve  regurgitation.  5. The aortic valve is tricuspid. There is mild calcification of the  aortic valve. Aortic valve regurgitation is not visualized. No aortic  stenosis is present.  6. There is borderline dilatation of the ascending aorta, measuring 37  mm.  7. The inferior vena cava is normal in size with greater than 50%  respiratory variability, suggesting right atrial pressure of 3 mmHg.    Neuro/Psych PSYCHIATRIC DISORDERS Anxiety CVA    GI/Hepatic Neg liver ROS, GERD  Medicated and Controlled,GI bleed   Endo/Other  negative endocrine ROS  Renal/GU Renal InsufficiencyRenal diseaseCr 1.83  negative  genitourinary   Musculoskeletal  (+) Arthritis , Osteoarthritis,    Abdominal   Peds  Hematology  (+) Blood dyscrasia, anemia , H/H 8.4/27.9   Anesthesia Other Findings   Reproductive/Obstetrics negative OB ROS                           Anesthesia Physical Anesthesia Plan  ASA: III  Anesthesia Plan: MAC   Post-op Pain Management:    Induction:   PONV Risk Score and Plan: 2 and Propofol infusion and TIVA  Airway Management Planned: Natural Airway and Simple Face Mask  Additional Equipment: None  Intra-op Plan:   Post-operative Plan:   Informed Consent: I have reviewed the patients History and Physical, chart, labs and discussed the procedure including the risks, benefits and alternatives for the proposed anesthesia with the patient or authorized representative who has indicated his/her understanding and acceptance.       Plan Discussed with: CRNA  Anesthesia Plan Comments: (Very hypertensive in preop- treating with PRN hydralazine per floor orders )      Anesthesia Quick Evaluation

## 2020-08-03 NOTE — Transfer of Care (Signed)
Immediate Anesthesia Transfer of Care Note  Patient: BRENTT FREAD  Procedure(s) Performed: COLONOSCOPY WITH PROPOFOL (N/A ) ESOPHAGOGASTRODUODENOSCOPY (EGD) WITH PROPOFOL (N/A ) BIOPSY  Patient Location: Endoscopy Unit  Anesthesia Type:MAC  Level of Consciousness: awake, alert  and patient cooperative  Airway & Oxygen Therapy: Patient Spontanous Breathing  Post-op Assessment: Report given to RN and Post -op Vital signs reviewed and stable  Post vital signs: Reviewed and stable  Last Vitals:  Vitals Value Taken Time  BP 126/46 08/03/20 1409  Temp 36.4 C 08/03/20 1409  Pulse 62 08/03/20 1411  Resp 17 08/03/20 1411  SpO2 98 % 08/03/20 1411  Vitals shown include unvalidated device data.  Last Pain:  Vitals:   08/03/20 1409  TempSrc: Temporal  PainSc: 0-No pain      Patients Stated Pain Goal: 10 (53/66/44 0347)  Complications: No complications documented.

## 2020-08-03 NOTE — Progress Notes (Signed)
Patient transferred to 3 W room 12 via bed. Vitals taken before transfer. Report given to the receiving RN. Patien'ts belongings with the patient. Patient said he is going to call his daughter and update her about the transfer.

## 2020-08-03 NOTE — Interval H&P Note (Signed)
History and Physical Interval Note:  08/03/2020 1:20 PM  Isaac Phelps  has presented today for surgery, with the diagnosis of iron deficiency anemia, hemoccult positive stool.  The various methods of treatment have been discussed with the patient and family. After consideration of risks, benefits and other options for treatment, the patient has consented to  Procedure(s): COLONOSCOPY WITH PROPOFOL (N/A) ESOPHAGOGASTRODUODENOSCOPY (EGD) WITH PROPOFOL (N/A) as a surgical intervention.  The patient's history has been reviewed, patient examined, no change in status, stable for surgery.  I have reviewed the patient's chart and labs.  Questions were answered to the patient's satisfaction.     Silvano Rusk

## 2020-08-03 NOTE — Progress Notes (Addendum)
Progress Note    Isaac Phelps  KNL:[id number] DOB: April 17, 1931  DOA: 07/29/2020 PCP: Dorothyann Peng, NP    Brief Narrative:     Medical records reviewed and are as summarized below:  Isaac Phelps is an 87 y.o. male with medical history significant of HTN noncompliant with medications, CKD stage III, GERD, gout, presented with chest pain/rib pain and epigastric pain for 1 month. Patient reported old left side facial numbness. MRI was obtain and reveal multiples small stroke. Neurology consulted. Stroke could be small vessel vs cardio embolic. Neurology recommend start Aspirin when clear by GI.  Patient was also found to have low hb, history of melena, Occult blood positive. GI consulted, plan for endoscopy/colonoscopy 3/02.  Assessment/Plan:   Active Problems:   Essential hypertension   HTN (hypertension), malignant   Iron deficiency anemia   Abnormal CT of liver   Melena   Cerebral embolism with cerebral infarction    Hypertensive emergency: Secondary to noncompliance with medication. -He received IV labetalol. -Patient was a started on amlodipine and Coreg and hydralazine.  -monitor  Anemia; iron deficiency, GI bleed.  Had episodes of  melena in January.  GI consulted.  s/p IV iron 2/26. s/p one unit PRBC 2/28. Plan for endoscopy /colonoscopy 3/02.  ADDENDUM: colonoscopy shows rectal mass  -Acute left frontal, occipital, and right parietal infarct. Late subacute early chronic corona radiata and left frontal white matter infarcts:  Left side Facial numbness; he has had this symptoms for Months. B12 low at 175. Started supplement.  MRI of the brain positive for multiples strokes,. CTA brain no large vessel oclusion.  Neurology consulted. LDL 134. Increase Zocor to 40 mg.  HbA1c 5.9 Start Baby aspirin when ok by GI.   Mild elevation troponin, flat likely related to malignant hypertension Echo: Normal ejection fraction, diastolic dysfunction grade 2.     CT Angio incidentals finding:   ASoft tissue haziness within the porta hepatic region, etiology is indeterminate.  Differential  consideration include confluent inflammation or infiltrative neoplasm:  -MRI negative for mass, in the porta hepatic region.    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   -He will need follow-up with pulmonologist.    Left Kidney lesion; needs Follow up MRI.    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  -PSA; 38.   -flomax  CKD stage IIIb;  Prior cr per records 1.6---2.1 Difficulty voiding: Flomax, ambulation Cr stable   Left-sided rib pain;  Dedicated rib x ray negative. Start lidocaine  patch  Family Communication/Anticipated D/C date and plan/Code Status   DVT prophylaxis: scd Code Status: Full Code.  Disposition Plan: Status is: Inpatient  Remains inpatient appropriate because:Inpatient level of care appropriate due to severity of illness   Dispo: The patient is from: Home              Anticipated d/c is to: Home              Patient currently is not medically stable to d/c. home in AM if BP stable-- will get outpatient follow up for rectal mass   Difficult to place patient No         Medical Consultants:    GI  neurology   Subjective:   Says EGD planned for 1 pm  Objective:    Vitals:   08/03/20 0506 08/03/20 0741  08/03/20 0816 08/03/20 0939  BP: (!) 153/62 (!) 169/72  (!) 172/67  Pulse:   63   Resp: 16 19    Temp:  98.5 F (36.9 C)    TempSrc:  Oral    SpO2:  94%    Weight:      Height:        Intake/Output Summary (Last 24 hours) at 08/03/2020 1059 Last data filed at 08/03/2020 0800 Gross per 24 hour  Intake 1120 ml  Output 575 ml  Net 545 ml   Filed Weights   07/29/20 2030  Weight: 77 kg    Exam:  General: Appearance:    elderly male in no acute distress      Lungs:     respirations unlabored  Heart:    Normal heart rate. Normal rhythm. No murmurs, rubs, or gallops.   MS:   All extremities are intact.   Neurologic:   Awake, alert, pleasant and cooperative     Data Reviewed:   I have personally reviewed following labs and imaging studies:  Labs: Labs show the following:   Basic Metabolic Panel: Recent Labs  Lab 07/30/20 0513 07/31/20 0127 08/01/20 1321 08/02/20 0830 08/03/20 0546  NA 138 137 136 135 140  K 3.9 4.1 4.1 3.9 3.8  CL 105 103 105 102 108  CO2 24 23 22  21* 22  GLUCOSE 104* 111* 100* 124* 103*  BUN 21 25* 32* 34* 28*  CREATININE 1.78* 2.05* 2.34* 2.21* 1.83*  CALCIUM 9.0 9.2 8.8* 9.2 8.9   GFR Estimated Creatinine Clearance: 30.3 mL/min (A) (by C-G formula based on SCr of 1.83 mg/dL (H)). Liver Function Tests: Recent Labs  Lab 07/29/20 1204  AST 34  ALT 8  ALKPHOS 77  BILITOT 0.7  PROT 7.6  ALBUMIN 3.5   Recent Labs  Lab 07/29/20 1204  LIPASE 36   No results for input(s): AMMONIA in the last 168 hours. Coagulation profile No results for input(s): INR, PROTIME in the last 168 hours.  CBC: Recent Labs  Lab 07/29/20 1204 07/29/20 1248 07/30/20 0513 07/30/20 1338 08/01/20 1321 08/02/20 0109 08/02/20 0830 08/02/20 1331 08/03/20 0546  WBC 6.1  --  6.3  --  7.3  --  8.0  --  6.0  NEUTROABS 4.6  --   --   --   --   --   --   --   --   HGB 8.3*   < > 7.8*   < > 8.4* 7.6* 9.0* 8.7* 8.4*  HCT 27.8*   < > 25.5*   < > 27.3* 25.1* 28.9* 27.7* 27.9*  MCV 74.5*  --  72.0*  --  73.2*  --  73.4*  --  74.2*  PLT 235  --  248  --  201  --  211  --  205   < > = values in this interval not displayed.   Cardiac Enzymes: No results for input(s): CKTOTAL, CKMB, CKMBINDEX, TROPONINI in the last 168 hours. BNP (last 3 results) No results for input(s): PROBNP in the last 8760 hours. CBG: Recent Labs  Lab 07/29/20 1300  GLUCAP 82   D-Dimer: No results for input(s): DDIMER in the last 72 hours. Hgb  A1c: Recent Labs    08/01/20 0128  HGBA1C 5.9*   Lipid Profile: Recent Labs    08/01/20 0128  CHOL 187  HDL 34*  LDLCALC 134*  TRIG 94  CHOLHDL 5.5   Thyroid function studies: No results for input(s):  TSH, T4TOTAL, T3FREE, THYROIDAB in the last 72 hours.  Invalid input(s): FREET3 Anemia work up: Recent Labs    07/31/20 1215  VITAMINB12 161*   Sepsis Labs: Recent Labs  Lab 07/30/20 0513 08/01/20 1321 08/02/20 0830 08/03/20 0546  WBC 6.3 7.3 8.0 6.0    Microbiology Recent Results (from the past 240 hour(s))  SARS CORONAVIRUS 2 (TAT 6-24 HRS) Nasopharyngeal Nasopharyngeal Swab     Status: None   Collection Time: 07/29/20  5:48 PM   Specimen: Nasopharyngeal Swab  Result Value Ref Range Status   SARS Coronavirus 2 NEGATIVE NEGATIVE Final    Comment: (NOTE) SARS-CoV-2 target nucleic acids are NOT DETECTED.  The SARS-CoV-2 RNA is generally detectable in upper and lower respiratory specimens during the acute phase of infection. Negative results do not preclude SARS-CoV-2 infection, do not rule out co-infections with other pathogens, and should not be used as the sole basis for treatment or other patient management decisions. Negative results must be combined with clinical observations, patient history, and epidemiological information. The expected result is Negative.  Fact Sheet for Patients: SugarRoll.be  Fact Sheet for Healthcare Providers: https://www.woods-mathews.com/  This test is not yet approved or cleared by the [place] FDA and  has been authorized for detection and/or diagnosis of SARS-CoV-2 by FDA under an Emergency Use Authorization (EUA). This EUA will remain  in effect (meaning this test can be used) for the duration of the COVID-19 declaration under Se ction 564(b)(1) of the Act, 21 U.S.C. section 360bbb-3(b)(1), unless the authorization is terminated or revoked sooner.  Performed at [facility], [place] [address], [place], [place] [address]   MRSA PCR Screening     Status: None   Collection Time: 07/29/20  8:34 PM   Specimen: Nasal Mucosa; Nasopharyngeal  Result Value Ref Range Status   MRSA by PCR NEGATIVE NEGATIVE Final    Comment:        The GeneXpert MRSA Assay (FDA approved for NASAL specimens only), is one component of a comprehensive MRSA colonization surveillance program. It is not intended to diagnose MRSA infection nor to guide or monitor treatment for MRSA infections. Performed at [facility], [place] [address], [place], [place] [address]     Procedures and diagnostic studies:  VAS US CAROTID  Result Date: 08/02/2020 Carotid Arterial Duplex Study Indications:       CVA. Risk Factors:      Hypertension, hyperlipidemia. Comparison Study:  no prior Performing Technologist: Abram Sander RVS  Examination Guidelines: A complete evaluation includes B-mode imaging, spectral Doppler, color Doppler, and power Doppler as needed of all accessible portions of each vessel. Bilateral testing is considered an integral part of a complete examination. Limited examinations for reoccurring indications may be performed as noted.  Right Carotid Findings: +----------+--------+--------+--------+------------------+--------+           PSV cm/sEDV cm/sStenosisPlaque DescriptionComments +----------+--------+--------+--------+------------------+--------+ CCA Prox  73      7               heterogenous               +----------+--------+--------+--------+------------------+--------+ CCA Distal68      7               heterogenous               +----------+--------+--------+--------+------------------+--------+ ICA Prox  69      15      1-39%   heterogenous               +----------+--------+--------+--------+------------------+--------+  ICA Distal64      19                                          +----------+--------+--------+--------+------------------+--------+ ECA       135     4                                          +----------+--------+--------+--------+------------------+--------+ +----------+--------+-------+--------+-------------------+           PSV cm/sEDV cmsDescribeArm Pressure (mmHG) +----------+--------+-------+--------+-------------------+ Subclavian89                                         +----------+--------+-------+--------+-------------------+ +---------+--------+--+--------+-+---------+ VertebralPSV cm/s53EDV cm/s9Antegrade +---------+--------+--+--------+-+---------+  Left Carotid Findings: +----------+--------+--------+--------+------------------+--------+           PSV cm/sEDV cm/sStenosisPlaque DescriptionComments +----------+--------+--------+--------+------------------+--------+ CCA Prox  70      12              heterogenous               +----------+--------+--------+--------+------------------+--------+ CCA Distal83      11              heterogenous               +----------+--------+--------+--------+------------------+--------+ ICA Prox  70      19      1-39%   heterogenous               +----------+--------+--------+--------+------------------+--------+ ICA Distal88      22                                         +----------+--------+--------+--------+------------------+--------+ ECA       182                                                +----------+--------+--------+--------+------------------+--------+ +----------+--------+--------+--------+-------------------+           PSV cm/sEDV cm/sDescribeArm Pressure (mmHG) +----------+--------+--------+--------+-------------------+ [id number]                                          +----------+--------+--------+--------+-------------------+ +---------+--------+--+--------+--+---------+ VertebralPSV cm/s60EDV cm/s10Antegrade  +---------+--------+--+--------+--+---------+   Summary: Right Carotid: Velocities in the right ICA are consistent with a 1-39% stenosis. Left Carotid: Velocities in the left ICA are consistent with a 1-39% stenosis. Vertebrals: Bilateral vertebral arteries demonstrate antegrade flow. *See table(s) above for measurements and observations.  Electronically signed by Antony Contras MD on 08/02/2020 at 1:01:08 PM.    Final     Medications:   . allopurinol  200 mg Oral Daily  . amLODipine  10 mg Oral Daily  . carvedilol  3.125 mg Oral BID WC  . cyanocobalamin  1,000 mcg Intramuscular Daily  . hydrALAZINE  50 mg Oral Q8H  . pantoprazole (PROTONIX) IV  40 mg Intravenous Q12H  . simvastatin  40 mg Oral Daily  . tamsulosin  0.4 mg  Oral Daily   Continuous Infusions: . sodium chloride       LOS: 4 days   Geradine Girt  [facility]   How to contact the [facility] Attending or Consulting provider [place] or covering provider during after hours [place], for this patient?  1. Check the care team in [facility] and look for a) attending/consulting [facility] provider listed and b) the [facility] team listed 2. Log into [email or web address] and use [place] universal password to access. If you do not have the password, please contact the hospital operator. 3. Locate the [facility] provider you are looking for under [facility] and page to a number that you can be directly reached. 4. If you still have difficulty reaching the provider, please page the [facility] (Director on Call) for the Hospitalists listed on amion for assistance.  08/03/2020, 10:59 AM

## 2020-08-04 ENCOUNTER — Encounter (HOSPITAL_COMMUNITY): Payer: Self-pay | Admitting: Internal Medicine

## 2020-08-04 DIAGNOSIS — C20 Malignant neoplasm of rectum: Principal | ICD-10-CM

## 2020-08-04 LAB — SURGICAL PATHOLOGY

## 2020-08-04 LAB — HEMOGLOBIN AND HEMATOCRIT, BLOOD
HCT: 24.9 % — ABNORMAL LOW (ref 39.0–52.0)
HCT: 30 % — ABNORMAL LOW (ref 39.0–52.0)
Hemoglobin: 7.6 g/dL — ABNORMAL LOW (ref 13.0–17.0)
Hemoglobin: 8.8 g/dL — ABNORMAL LOW (ref 13.0–17.0)

## 2020-08-04 MED ORDER — AMLODIPINE BESYLATE 10 MG PO TABS: 10.0000 mg | ORAL_TABLET | Freq: Every day | ORAL | 1 refills | Status: DC

## 2020-08-04 MED ORDER — VITAMIN B-12 1000 MCG PO TABS: 1000.0000 ug | ORAL_TABLET | Freq: Every day | ORAL | 1 refills | Status: AC

## 2020-08-04 MED ORDER — PANTOPRAZOLE SODIUM 40 MG PO TBEC: 40.0000 mg | DELAYED_RELEASE_TABLET | Freq: Two times a day (BID) | ORAL | 1 refills | Status: DC

## 2020-08-04 MED ORDER — HYDRALAZINE HCL 50 MG PO TABS: 50.0000 mg | ORAL_TABLET | Freq: Three times a day (TID) | ORAL | 1 refills | Status: DC

## 2020-08-04 MED ORDER — ASPIRIN 81 MG PO CHEW
81.0000 mg | CHEWABLE_TABLET | Freq: Every day | ORAL | Status: DC
  Administered 2020-08-04: 81 mg via ORAL
  Filled 2020-08-04: qty 1

## 2020-08-04 MED ORDER — STROKE: EARLY STAGES OF RECOVERY BOOK
Status: AC
  Filled 2020-08-04: qty 1

## 2020-08-04 MED ORDER — CARVEDILOL 3.125 MG PO TABS: 3.1250 mg | ORAL_TABLET | Freq: Two times a day (BID) | ORAL | 1 refills | Status: DC

## 2020-08-04 MED ORDER — HYDRALAZINE HCL 50 MG PO TABS
75.0000 mg | ORAL_TABLET | Freq: Three times a day (TID) | ORAL | Status: DC
  Administered 2020-08-04: 75 mg via ORAL
  Filled 2020-08-04: qty 1

## 2020-08-04 MED ORDER — TAMSULOSIN HCL 0.4 MG PO CAPS: 0.4000 mg | ORAL_CAPSULE | Freq: Every day | ORAL | 1 refills | Status: AC

## 2020-08-04 MED ORDER — SIMVASTATIN 40 MG PO TABS: 40.0000 mg | ORAL_TABLET | Freq: Every day | ORAL | 1 refills | Status: DC

## 2020-08-04 MED ORDER — ASPIRIN 81 MG PO CHEW: 81.0000 mg | CHEWABLE_TABLET | Freq: Every day | ORAL | 0 refills | Status: AC

## 2020-08-04 NOTE — Progress Notes (Signed)
STROKE TEAM PROGRESS NOTE   INTERVAL HISTORY He is sitting up in bed .Patient states he still has numbness around his face and his left hand but not bothersome.  Vital signs are stable.  Colonoscopy yesterday suggested rectal carcinoma and pathology is pending.  Vital signs stable.  No new complaints. Vitals:   08/04/20 0310 08/04/20 0754 08/04/20 0819 08/04/20 1228  BP: (!) 151/61 (!) 177/64 (!) 158/78 (!) 162/61  Pulse: 63 64 66 64  Resp: 20 13  17   Temp: 98 F (36.7 C) 98 F (36.7 C)  98.3 F (36.8 C)  TempSrc: Oral Oral  Oral  SpO2: 95% 99%  99%  Weight:      Height:       CBC:  Recent Labs  Lab 07/29/20 1204 07/29/20 1248 08/02/20 0830 08/02/20 1331 08/03/20 0546 08/03/20 1727 08/04/20 0132 08/04/20 1224  WBC 6.1   < > 8.0  --  6.0  --   --   --   NEUTROABS 4.6  --   --   --   --   --   --   --   HGB 8.3*   < > 9.0*   < > 8.4*   < > 7.6* 8.8*  HCT 27.8*   < > 28.9*   < > 27.9*   < > 24.9* 30.0*  MCV 74.5*   < > 73.4*  --  74.2*  --   --   --   PLT 235   < > 211  --  205  --   --   --    < > = values in this interval not displayed.   Basic Metabolic Panel:  Recent Labs  Lab 08/02/20 0830 08/03/20 0546  NA 135 140  K 3.9 3.8  CL 102 108  CO2 21* 22  GLUCOSE 124* 103*  BUN 34* 28*  CREATININE 2.21* 1.83*  CALCIUM 9.2 8.9   Lipid Panel:  Recent Labs  Lab 08/01/20 0128  CHOL 187  TRIG 94  HDL 34*  CHOLHDL 5.5  VLDL 19  LDLCALC 134*   HgbA1c:  Recent Labs  Lab 08/01/20 0128  HGBA1C 5.9*   Urine Drug Screen: No results for input(s): LABOPIA, COCAINSCRNUR, LABBENZ, AMPHETMU, THCU, LABBARB in the last 168 hours.  Alcohol Level No results for input(s): ETH in the last 168 hours.  IMAGING past 24 hours MR ANGIO HEAD WO CONTRAST  Result Date: 07/31/2020 IMPRESSION: Artifact related to intravascular T1 shortening agent, probably Feraheme or similar. Normal intracranial MR angiography of the large and medium size vessels allowing for that.   MR BRAIN  WO CONTRAST  Result Date: 07/31/2020 IMPRESSION: Small acute left frontal, left occipital and right periatrial white matter infarcts. Query late subacute/early chronic right corona radiata and left frontal white matter insult. Chronic right thalamic insult. Mild chronic microvascular ischemic changes.   CT head Result Date: 07/29/2020 Mild diffuse cortical atrophy. Mild chronic ischemic white matter disease. No acute intracranial abnormality seen.  PHYSICAL EXAM Pleasant frail elderly African-American male not in distress. . Afebrile. Head is nontraumatic. Neck is supple without bruit.    Cardiac exam no murmur or gallop. Lungs are clear to auscultation. Distal pulses are well felt. Neurological Exam ;  Awake  Alert oriented x 3. Normal speech and language.eye movements full without nystagmus.fundi were not visualized. Vision acuity and fields appear normal. Hearing is normal. Palatal movements are normal. Face symmetric. Tongue midline. Normal strength, tone, reflexes and coordination.  Diminished  perioral and left hand touch pinprick sensation. Gait deferred. ASSESSMENT/PLAN Isaac Phelps is a 87 y.o. male with history of coronary artery disease, hypertension noncompliant to medications, CKD 3, hyperlipidemia, tobacco abuse, admitted to the hospital since 07/29/2020, for evaluation of complaints of chest pain and rib pain for about 1 month.  He developed left facial and hand numbness a few weeks ago.  An MRI of the brain which showed small acute left frontal, left occipital and right parietal white matter infarcts and a question of possible late subacute or early chronic right corona radiata and left frontal white matter infarcts along with chronic right thalamic insults. LDL 134, HgbA1c 5.9  Stroke:  multiple lacunar strokes (left frontal, occipital and parietal white matter infarcts) likely secondary to cardioembolic versus small vessel disease   CT head No acute abnormality. Mild diffuse  cortical atrophy.   MRA head:  Normal intracranial MR  MRI brain: Small acute left frontal, left occipital and right periatrial white matter infarcts. subacute/early chronic right corona radiata and left frontal white matter insult. Chronic right thalamic insult  Carotid Doppler  Left and right ICA 1-39% stenosis  2D Echo: EF 60-65%, LVH, Grade 2 diastolic dysfunction  LDL 134  HgbA1c 5.9  VTE prophylaxis - holding 2/2 anemia, SCDs for now  No antithrombotic prior to admission, now on No antithrombotic due to acute anemia/GI bleed, recommend enteric-coated aspirin 81mg  once GI medicine deems safe from GI/anemia stand point  Therapy recommendations:  pending  Disposition:  pending  Hypertension, Emergency  Admission BP 240/100  Home meds:  Not currently taking any medication  Unstable  Currently on Norvasc 10mg  daily, coreg 3.125mg  bid, Hydralazine 50mg  q 8hr . Long-term BP goal normotensive  Hyperlipidemia  Home meds:  Does not take medication  Add simvastatin 40 mg daily  LDL 134, goal < 70  Continue statin at discharge   Anemia, Iron deficiency/GI bleed  Chronic NSAID use  FOBT positive stool  Serial H/H checks  EGD and colonoscopy pending  Continuous PPI  Received IV iron 2/26  PRBC 1 unit given  hgb 8.5 -> 6.7 -> 8.4  Tobacco Abuse  Advised to stop smoking  Nicotine patch  Other Stroke Risk Factors  Advanced Age >/= 45   Cigarette smoker will advised to stop smoking  Coronary artery disease  Other Active Problems  Chronic Kidney disease, stage IIIb  Baseline 1.6 -2.1  Creatinine on admission 1.8, current 2.34  Flomax  Strict In/outs  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  Can defer to primary hospitalist service inpatient versus outpatient work-up  Pulmonary nodule  Left lower lobe 2.2 cm, part solid nodule; suspicious for low grade indolent pulmonary neoplasm such as adenocarcinoma.     pulmonologist outpatient   Hospital day # 5    He has presented with sudden onset of perioral and left hand paresthesias likely from his possible lacunar concurrent infarcts from small vessel disease.  Patient had recent GI bleeding from newly diagnosed possible rectal carcinoma.  He is at increased risk for bleeding since hence recommend to not do dual antiplatelet therapy and consider only coated 81 mg aspirin if okay from GI standpoint .  Aggressive risk factor modification.  Stroke team will sign off.  Kindly call for questions.   Antony Contras, MD Medical Director [facility] Pager: [phone] 08/04/2020 1:54 PM   To contact Stroke Continuity provider, please refer to [email or web address] After hours, contact General Neurology

## 2020-08-04 NOTE — Progress Notes (Addendum)
Daily Rounding Note  08/04/2020, 10:28 AM  LOS: 5 days   SUBJECTIVE:   Chief complaint:   IDA.  Rectal mass  No complaints.  Wants to go home today. Faint pink tinge on tissue he inserts in rectal area.    OBJECTIVE:         Vital signs in last 24 hours:    Temp:  [97.5 F (36.4 C)-99.3 F (37.4 C)] 98 F (36.7 C) (03/03 0754) Pulse Rate:  [61-74] 66 (03/03 0819) Resp:  [13-26] 13 (03/03 0754) BP: (126-209)/(46-78) 158/78 (03/03 0819) SpO2:  [94 %-100 %] 99 % (03/03 0754) Weight:  [77 kg-77.6 kg] 77.6 kg (03/02 2326) Last BM Date: 08/03/20 Filed Weights   07/29/20 2030 08/03/20 1247 08/03/20 2326  Weight: 77 kg 77 kg 77.6 kg   General: looks well, younger than 19!   Heart: RRR Chest: clear bil Abdomen: soft, NT, ND.  Active BS  Extremities: no CCE Neuro/Psych:  Oriented x 3.  Appropriate.  Fluid speech.     Lab Results: Recent Labs    08/01/20 1321 08/02/20 0109 08/02/20 0830 08/02/20 1331 08/03/20 0546 08/03/20 1727 08/04/20 0132  WBC 7.3  --  8.0  --  6.0  --   --   HGB 8.4*   < > 9.0*   < > 8.4* 9.0* 7.6*  HCT 27.3*   < > 28.9*   < > 27.9* 29.9* 24.9*  PLT 201  --  211  --  205  --   --    < > = values in this interval not displayed.    ASSESMENT:   *   IDA.  FOBT + stool.  Dark stools in 06/2020.   07/29/20 CTAP w contrast: hepatic cysts, GB stones, normal biliary tree.  Haziness in portahepatis, diff includes inflammation, infiltrating neoplasm.  LLL pulmonary nodule susp for neoplasm.  Renal cysts and L renal lesion,  Non-obstr L inguinal hernia, prostamegaly w mass effect to bladder base, sclerotic lesion L 5 likely is large bone island.   08/03/20 EGD: Normal 08/03/20 Colonoscopy: distal rectal mass abutting anus, likely malignant tumor wass oozing blood.  Biopsy path pndg.  O/w normal study w/o polyps etc.   Hgb 8.4 >> 9 >> 7.6 in last 22 hours.    *   Acute multifocal CVAs.  81 mg ASA, no DAPT  rec from Dr Leonie Man.  *   Indolent L renal neoplasm?       PLAN   *   Await path report. GI will follow peripherally.    *   Discharge w oncology fup??  Needs to either see oncology as inpt or have definitive onc fup.    *   ? Need for MRI to eval renal lesion vs let oncology orchestrate additional testing given multi-organ suspicious lesions?      Isaac Phelps  08/04/2020, 10:28 AM Phone [phone]     [place] Attending   I have taken an interval history, reviewed the chart and examined the patient. I agree with the Advanced Practitioner's note, impression and recommendations.   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  I have explained this to the patient.  He does have a lung lesion which could be a met.  Hospitalist is arranging oncology follow-up.   If he has stage IV disease then EUS would not be necessary.  We will wait for  oncology to do further staging and GI will be available.   He is going home today.   Gatha Mayer, MD, [facility] 08/04/2020 2:18 PM

## 2020-08-04 NOTE — Progress Notes (Signed)
Discharge instructions reviewed with pt and daughter.  All questions answered.  Discharged to home.   

## 2020-08-04 NOTE — Discharge Summary (Signed)
Physician Discharge Summary  MIKHAEL HENDRIKS GEX:[id number] DOB: Oct 23, 1930 DOA: 07/29/2020  PCP: Dorothyann Peng, NP  Admit date: 07/29/2020 Discharge date: 08/04/2020  Admitted From: home Disposition:  home  Recommendations for Outpatient Follow-up:  1. Follow up with PCP in 1-2 weeks. 2. Please obtain BMP/CBC in one week. 3. Please follow up on the following pending results.  Home Health:no  Equipment/Devices: none  Discharge Condition: Stable Code Status:   Code Status: Full Code Diet recommendation:  Diet Order            Diet - low sodium heart healthy           Diet Heart Room service appropriate? Yes; Fluid consistency: Thin  Diet effective now                  Brief/Interim Summary:   87 y.o. male with medical history significant ofHTN noncompliant with medications, CKD stageIII,GERD, gout, presented with chest pain/rib pain and epigastric painfor 1 month. Patient reported old left side facial numbness. MRI was obtain and reveal multiples small stroke. Neurology consulted. Stroke could be small vessel vs cardio embolic. Neurology recommend start Aspirin when clear by GI.  Patient was also found to have low hb, history of melena, Occult blood positive. GI consulted, plan for endoscopy/colonoscopy 3/02. 08/03/20 EGD: Normal 08/03/20 Colonoscopy: distal rectal mass abutting anus, likely malignant tumor wass oozing blood.  Biopsy path pndg.  O/w normal study w/o polyps etc.   Monitor overnight hemoglobin improved and made 8 g range. He was also treated for hypertensive emergency, acute left frontal occipital, right parietal infarct, seen by neurology underwent stroke work-up CT brain no LVO, LDL 134 Zocor increased to 40 mg, HbA1c 5.9.  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  I discussed Dr. Lorenso Courier from oncology and he will set up an outpatient evaluation follow-up for ongoing discussion and management. This was relayed to patient's son and patient personally. Discussed with GI and oncology and family and plan for discharge home today  CTA chest 1.There is mild hazy soft tissue within the porta hepatic region which encases the replaced right hepatic artery. Etiology is indeterminate. Differential considerations include confluent inflammation or infiltrative neoplasm. In the absence of signs or symptoms of inflammation/infection further investigation with abdominal MRI without and with contrast material is recommended. 3. There is a subtle, slightly exophytic soft tissue attenuating lesion arising off the medial cortex of the upper pole of left kidney. This is indeterminate and may represent a hemorrhagic/proteinaceous cyst versus solid enhancing neoplasm. Further evaluation with contrast enhanced abdominal MRI is recommended. 4. There is a part solid nodule within the superior segment of left lower lobe measuring 2.2 cm with central solid component measuring 6 mm. Previously this was entirely non solid measuring 1.8 x 1.4 cm. This is considered highly suspicious for low-grade indolent pulmonary neoplasm such as adenocarcinoma. This recommendation follows the consensus statement: Guidelines for Management of Incidental Pulmonary Nodules Detected on CT Images  MRI abdomen 1. No evidence of mass in the porta hepatis.The pancreas appears normal, and there is no biliary or pancreatic ductal dilatation. 2. Small hypoenhancing lesions medially in the upper pole of the left kidney and anteriorly in the interpolar region of the right kidney,  suspicious for small neoplasms. These lesions are incompletely evaluated by this examination due to motion artifact. Clinical significance is doubtful given the  patient's age, and dedicated renal MR or CT (pre and post contrast) follow-up in 6-12 months recommended. 3. Cholelithiasis without evidence of biliary dilatation.  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  Outpatient neurology follow-up.  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  On aspirin 81, statin  Hypertensive emergency blood pressure has now stabilized continue amlodipine Coreg and hydralazine.  Outpatient PCP follow-up.  Iron deficiency anemia/GI bleeding with melena, status post IV iron, 1 unit PRBC underwent EGD colonoscopy-has a rectal mass.  Outpatient CBC check in 1 week  Enlarged prostate PSA 38 Flomax started outpatient urology follow-up advised  AKI on CKD stage IIIb: Renal function stable at 1.8 improved from 2.3 Recent Labs    07/29/20 1204 07/29/20 1248 07/30/20 0513 07/31/20 0127 08/01/20 1321 08/02/20 0830 08/03/20 0546  BUN 20 29* 21 25* 32* 34* 28*  CREATININE 1.77* 1.80* 1.78* 2.05* 2.34* 2.21* 1.83*   Left-sided rib pain x-ray negative, lidocaine patch as needed.  B12 insufficiency at 175 started on supplementation    Consults:  GI, neurology, oncology  Subjective: Alert awake oriented x3, resting comfortably.  Wants to go home today.  Discharge Exam: Vitals:   08/04/20 0819 08/04/20 1228  BP: (!) 158/78 (!) 162/61  Pulse: 66 64  Resp:  17  Temp:  98.3 F (36.8 C)  SpO2:  99%   General: Pt is alert, awake, not in acute distress Cardiovascular: RRR, S1/S2 +, no  rubs, no gallops Respiratory: CTA bilaterally, no wheezing, no rhonchi Abdominal: Soft, NT, ND, bowel sounds + Extremities: no edema, no cyanosis  Discharge Instructions  Discharge Instructions    Diet - low sodium heart healthy   Complete by: As directed    Discharge instructions   Complete by: As directed    Check CBC in 1 week  Follow-up with Dr. Dorsey/oncology, if you do not hear from them please call the office in a week.  Outpatient neurology follow-up for post stroke  Please call call MD or return to ER for similar or worsening recurring problem that brought you to hospital or if any fever,nausea/vomiting,abdominal pain, uncontrolled pain, chest pain,  shortness of breath or any other alarming symptoms.  Please follow-up your doctor as instructed in a week time and call the office for appointment.  Please avoid alcohol, smoking, or any other illicit substance and maintain healthy habits including taking your regular medications as prescribed.  You were cared for by a hospitalist during your hospital stay. If you have any questions about your discharge medications or the care you received while you were in the hospital after you are discharged, you can call the unit and ask to speak with the hospitalist on call if the hospitalist that took care of you is not available.  Once you are discharged, your primary care physician will handle any further medical issues. Please note that NO REFILLS for any discharge medications will be authorized once you are discharged, as it is imperative that  you return to your primary care physician (or establish a relationship with a primary care physician if you do not have one) for your aftercare needs so that they can reassess your need for medications and monitor your lab values   Increase activity slowly   Complete by: As directed      Allergies as of 08/04/2020   No Known Allergies     Medication List    STOP taking these medications    ibuprofen 200 MG tablet Commonly known as: ADVIL   losartan 100 MG tablet Commonly known as: COZAAR     TAKE these medications   allopurinol 100 MG tablet Commonly known as: ZYLOPRIM TAKE 2 TABLETS BY MOUTH EVERY DAY   amLODipine 10 MG tablet Commonly known as: NORVASC Take 1 tablet (10 mg total) by mouth daily.   Artificial Tears 1.4 % ophthalmic solution Generic drug: polyvinyl alcohol Place 1 drop into both eyes daily as needed for dry eyes.   aspirin 81 MG chewable tablet Chew 1 tablet (81 mg total) by mouth daily.   carvedilol 3.125 MG tablet Commonly known as: COREG Take 1 tablet (3.125 mg total) by mouth 2 (two) times daily with a meal.   hydrALAZINE 50 MG tablet Commonly known as: APRESOLINE Take 1 tablet (50 mg total) by mouth every 8 (eight) hours.   pantoprazole 40 MG tablet Commonly known as: Protonix Take 1 tablet (40 mg total) by mouth 2 (two) times daily.   simvastatin 40 MG tablet Commonly known as: ZOCOR Take 1 tablet (40 mg total) by mouth daily. Start taking on: August 05, 2020 What changed: how much to take   tamsulosin 0.4 MG Caps capsule Commonly known as: FLOMAX Take 1 capsule (0.4 mg total) by mouth daily. Start taking on: August 05, 2020   Tylenol 8 Hour Arthritis Pain 650 MG CR tablet Generic drug: acetaminophen Take 650 mg by mouth daily as needed for pain.   vitamin B-12 1000 MCG tablet Commonly known as: CYANOCOBALAMIN Take 1 tablet (1,000 mcg total) by mouth daily.       Follow-up Information    Festus Aloe, MD Follow up in 1 week(s).   Specialty: Urology Contact information: [place] [address] [phone]        Dorothyann Peng, NP Follow up in 1 week(s).   Specialty: Family Medicine Contact information: [address] [place] [address] [phone]              No Known Allergies  The results of significant diagnostics from this hospitalization (including imaging,  microbiology, ancillary and laboratory) are listed below for reference.    Microbiology: Recent Results (from the past 240 hour(s))  SARS CORONAVIRUS 2 (TAT 6-24 HRS) Nasopharyngeal Nasopharyngeal Swab     Status: None   Collection Time: 07/29/20  5:48 PM   Specimen: Nasopharyngeal Swab  Result Value Ref Range Status   SARS Coronavirus 2 NEGATIVE NEGATIVE Final    Comment: (NOTE) SARS-CoV-2 target nucleic acids are NOT DETECTED.  The SARS-CoV-2 RNA is generally detectable in upper and lower respiratory specimens during the acute phase of infection. Negative results do not preclude SARS-CoV-2 infection, do not rule out co-infections with other pathogens, and should not be used as the sole basis for treatment or other patient management decisions. Negative results must be combined with clinical observations, patient history, and epidemiological information. The expected result is Negative.  Fact Sheet for Patients: SugarRoll.be  Fact Sheet for Healthcare Providers: https://www.woods-mathews.com/  This test is not yet approved or cleared by the [place] and  has been authorized for detection and/or diagnosis of SARS-CoV-2 by FDA under an Emergency Use Authorization (EUA). This EUA will remain  in effect (meaning this test can be used) for the duration of the COVID-19 declaration under Se ction 564(b)(1) of the Act, 21 U.S.C. section 360bbb-3(b)(1), unless the authorization is terminated or revoked sooner.  Performed at [facility], [place] [address], [place], [place] [address]   MRSA PCR Screening     Status: None   Collection Time: 07/29/20  8:34 PM   Specimen: Nasal Mucosa; Nasopharyngeal  Result Value Ref Range Status   MRSA by PCR NEGATIVE NEGATIVE Final    Comment:        The GeneXpert MRSA Assay (FDA approved for NASAL specimens only), is one component of a comprehensive MRSA colonization surveillance program. It  is not intended to diagnose MRSA infection nor to guide or monitor treatment for MRSA infections. Performed at [facility], [place] [address], [place], [place] [address]     Procedures/Studies: DG Chest 2 View  Result Date: 07/29/2020 CLINICAL DATA:  Chest pain and hypertension EXAM: CHEST - 2 VIEW COMPARISON:  February 09, 2015 FINDINGS: Lungs are clear. Heart is mildly enlarged with pulmonary vascularity normal. No adenopathy. There is aortic atherosclerosis. No bone lesions. IMPRESSION: Mild cardiomegaly. Lungs clear. Aortic Atherosclerosis (ICD10-I70.0). Electronically Signed   By: Lowella Grip III M.D.   On: 07/29/2020 12:56   DG Ribs Unilateral Left  Result Date: 07/30/2020 CLINICAL DATA:  Anterior rib pain. EXAM: LEFT RIBS - 2 VIEW COMPARISON:  Chest x-ray 07/29/2020 and 02/09/2015 FINDINGS: No fracture or other bone lesions are seen involving the ribs. IMPRESSION: Negative. Electronically Signed   By: Marin Olp M.D.   On: 07/30/2020 10:29   CT Head Wo Contrast  Result Date: 07/29/2020 CLINICAL DATA:  Left facial numbness. EXAM: CT HEAD WITHOUT CONTRAST TECHNIQUE: Contiguous axial images were obtained from the base of the skull through the vertex without intravenous contrast. COMPARISON:  None. FINDINGS: Brain: Mild diffuse cortical atrophy is noted. Mild chronic ischemic white matter disease is noted. No mass effect or midline shift is noted. Ventricular size is within normal limits. There is no evidence of mass lesion, hemorrhage or acute infarction. Vascular: No hyperdense vessel or unexpected calcification. Skull: Normal. Negative for fracture or focal lesion. Sinuses/Orbits: No acute finding. Other: None. IMPRESSION: Mild diffuse cortical atrophy. Mild chronic ischemic white matter disease. No acute intracranial abnormality seen. Electronically Signed   By: Marijo Conception M.D.   On: 07/29/2020 14:51   MR ANGIO HEAD WO CONTRAST  Result Date: 07/31/2020 CLINICAL DATA:   Left-sided numbness. Bilateral infarction shown by MRI. EXAM: MRA HEAD WITHOUT CONTRAST TECHNIQUE: Angiographic images of the Circle of Willis were obtained using MRA technique without intravenous contrast. COMPARISON:  MRI same day FINDINGS: The study is somewhat hindered by the presence of T1 shortening age in, probably 43. Both internal carotid arteries appear widely patent through the skull base and siphon regions. The anterior and middle cerebral vessels are patent without proximal stenosis, aneurysm or vascular malformation. No sign of large or medium vessel occlusion. Both vertebral arteries are widely patent to the basilar. No basilar stenosis. Posterior circulation branch vessels appear normal. IMPRESSION: Artifact related to intravascular T1 shortening agent, probably Feraheme or similar. Normal intracranial MR angiography of the large and medium size vessels allowing for that. Electronically Signed  By: Nelson Chimes M.D.   On: 07/31/2020 18:59   MR BRAIN WO CONTRAST  Result Date: 07/31/2020 CLINICAL DATA:  Neuro deficit, acute, stroke suspected EXAM: MRI HEAD WITHOUT CONTRAST TECHNIQUE: Multiplanar, multiecho pulse sequences of the brain and surrounding structures were obtained without intravenous contrast. COMPARISON:  07/29/2020. FINDINGS: Please note some image sequences are mildly degraded by motion artifact. Brain: Scattered small acute infarcts involving the left frontal lobe, left occipital cortex and right periatrial white matter. Query late subacute/early chronic right corona radiata and left frontal white matter insults. Mild cerebral atrophy with ex vacuo dilatation. Mild chronic microvascular ischemic changes. No intracranial hemorrhage. Chronic right thalamic insult. No midline shift, ventriculomegaly or extra-axial fluid collection. No mass lesion. Vascular: Normal flow voids. Skull and upper cervical spine: Normal marrow signal. Sinuses/Orbits: Normal orbits. Clear paranasal  sinuses. No mastoid effusion. Other: Sequela of recent Feraheme transfusion limits evaluation on SWI. IMPRESSION: Small acute left frontal, left occipital and right periatrial white matter infarcts. Query late subacute/early chronic right corona radiata and left frontal white matter insult. Chronic right thalamic insult. Mild chronic microvascular ischemic changes. These results were called by telephone at the time of interpretation on 07/31/2020 at 3:24 pm to provider [facility] , who verbally acknowledged these results. Electronically Signed   By: Primitivo Gauze M.D.   On: 07/31/2020 15:28   MR ABDOMEN W WO CONTRAST  Result Date: 07/30/2020 CLINICAL DATA:  Indeterminate soft tissue stranding in the porta hepatis on prior CT. Question neoplasm. Indeterminate left renal lesion. EXAM: MRI ABDOMEN WITHOUT AND WITH CONTRAST TECHNIQUE: Multiplanar multisequence MR imaging of the abdomen was performed both before and after the administration of intravenous contrast. CONTRAST:  7.88mL GADAVIST GADOBUTROL 1 MMOL/ML IV SOLN COMPARISON:  Radiograph 12/06/2013. Abdominal CTA 07/29/2020 and chest CT 08/07/2011. FINDINGS: Despite efforts by the technologist and patient, mild motion artifact is present on today's exam and could not be eliminated. This reduces exam sensitivity and specificity. Lower chest: Right retrocrural cystic lesion measuring up to 4.1 cm on image 17/4 is stable from the remote CT, consistent with a benign finding. Probable left ventricular hypertrophy. The visualized lower chest otherwise appears unremarkable. Hepatobiliary: There is diffusely decreased signal throughout the liver on T2 weighted images, and loss of signal on the in phase images consistent with hemosiderosis. There are scattered hepatic cysts. No suspicious liver lesions. Multiple gallstones are again noted. No evidence of gallbladder wall thickening or biliary dilatation. Pancreas: Unremarkable. No pancreatic ductal dilatation  or surrounding inflammatory changes. Spleen: Diffusely decreased T2 signal consistent with hemosiderosis. No focal abnormality or splenomegaly. Adrenals/Urinary Tract: Both adrenal glands appear normal. Bilateral renal cysts are noted, largest in the upper pole of the left kidney, measuring 3.2 cm in diameter. The indeterminate lesion medially in the upper pole of the left kidney is suboptimally evaluated by this examination due to motion artifact. However, there is a hypoenhancing lesion in this area which measures 1.1 cm, best seen on image 35/20. In addition, there is an 8 mm lesion anteriorly in the interpolar region of the right kidney (image 38/20) which also demonstrates low-level enhancement and low T2 signal. No hydronephrosis. Stomach/Bowel: Moderate ingested material in the stomach. No evidence bowel wall thickening, distention or surrounding inflammation. Vascular/Lymphatic: Aortic and branch vessel atherosclerosis. No evidence of aneurysm or large vessel occlusion. The portal, superior mesenteric and splenic veins appear patent. No enlarged abdominal lymph nodes are seen. Other: No mass, inflammation or abnormal enhancement is seen within the porta hepatis. Evaluation  is somewhat limited by the breathing artifact, although no significant abnormality is suspected. Musculoskeletal: No acute or significant osseous findings. Multilevel spondylosis. 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 IMPRESSION: 1. No evidence of mass in the porta hepatis. The pancreas appears normal, and there is no biliary or pancreatic ductal dilatation. 2. Small hypoenhancing lesions medially in the upper pole of the left kidney and anteriorly in the interpolar region of the right kidney, suspicious for small neoplasms. These lesions are incompletely evaluated by this examination due to motion artifact. Clinical significance is doubtful given the patient's age, and dedicated  renal MR or CT (pre and post contrast) follow-up in 6-12 months recommended. 3. Cholelithiasis without evidence of biliary dilatation. 4. Hemosiderosis. Electronically Signed   By: Richardean Sale M.D.   On: 07/30/2020 16:36   ECHOCARDIOGRAM COMPLETE  Result Date: 07/30/2020    ECHOCARDIOGRAM REPORT   Patient Name:   Isaac Phelps Date of Exam: 07/30/2020 Medical Rec #:  [id number]     Height:       71.0 in Accession #:    [id number]    Weight:       169.8 lb Date of Birth:  1931/04/10     BSA:          1.967 m Patient Age:    61 years      BP:           145/62 mmHg Patient Gender: M             HR:           72 bpm. Exam Location:  Inpatient Procedure: 2D Echo, Color Doppler, Cardiac Doppler, Strain Analysis and 3D Echo Indications:    Chest Pain R07.9  History:        Patient has no prior history of Echocardiogram examinations.                 Risk Factors:Hypertension, Dyslipidemia and Current Smoker.                 Chronic kidney disease. GERD. Anemia. Left kidney lesion.  Sonographer:    Darlina Sicilian RDCS Referring Phys: [id number] [place]  1. Left ventricular ejection fraction, by estimation, is 60 to 65%. The left ventricle has normal function. The left ventricle has no regional wall motion abnormalities. There is moderate concentric left ventricular hypertrophy. Left ventricular diastolic parameters are consistent with Grade II diastolic dysfunction (pseudonormalization). The global longitudinal strain is normal.  2. Right ventricular systolic function is normal. The right ventricular size is normal. Mildly increased right ventricular wall thickness.  3. Left atrial size was moderately dilated.  4. The mitral valve is grossly normal. Trivial mitral valve regurgitation.  5. The aortic valve is tricuspid. There is mild calcification of the aortic valve. Aortic valve regurgitation is not visualized. No aortic stenosis is present.  6. There is borderline dilatation of the ascending aorta,  measuring 37 mm.  7. The inferior vena cava is normal in size with greater than 50% respiratory variability, suggesting right atrial pressure of 3 mmHg. Comparison(s): No prior Echocardiogram. FINDINGS  Left Ventricle: No systolic anterior motion of the mitral valve. Left ventricular ejection fraction, by estimation, is 60 to 65%. The left ventricle has normal function. The left ventricle has no regional wall motion abnormalities. The global longitudinal strain is normal. The left ventricular internal cavity size was normal in size. There is moderate concentric left  ventricular hypertrophy. Left ventricular diastolic parameters are consistent with Grade II diastolic dysfunction (pseudonormalization). Right Ventricle: The right ventricular size is normal. Mildly increased right ventricular wall thickness. Right ventricular systolic function is normal. Left Atrium: Left atrial size was moderately dilated. Right Atrium: Right atrial size was normal in size. Pericardium: There is no evidence of pericardial effusion. Mitral Valve: The mitral valve is grossly normal. Trivial mitral valve regurgitation. Tricuspid Valve: The tricuspid valve is normal in structure. Tricuspid valve regurgitation is not demonstrated. No evidence of tricuspid stenosis. Aortic Valve: The aortic valve is tricuspid. There is mild calcification of the aortic valve. There is mild aortic valve annular calcification. Aortic valve regurgitation is not visualized. No aortic stenosis is present. Pulmonic Valve: The pulmonic valve was grossly normal. Pulmonic valve regurgitation is trivial. No evidence of pulmonic stenosis. Aorta: ASHI 2.05 cm/m (low risk). The aortic root is normal in size and structure. There is borderline dilatation of the ascending aorta, measuring 37 mm. Venous: The inferior vena cava is normal in size with greater than 50% respiratory variability, suggesting right atrial pressure of 3 mmHg. IAS/Shunts: The atrial septum is grossly  normal.  LEFT VENTRICLE PLAX 2D LVIDd:         4.60 cm  Diastology LVIDs:         3.00 cm  LV e' medial:    5.44 cm/s LV PW:         1.50 cm  LV E/e' medial:  14.1 LV IVS:        1.40 cm  LV e' lateral:   5.98 cm/s LVOT diam:     2.10 cm  LV E/e' lateral: 12.8 LV SV:         83 LV SV Index:   42       2D Longitudinal Strain LVOT Area:     3.46 cm 2D Strain GLS (A4C):   -23.0 %  RIGHT VENTRICLE RV S prime:     20.20 cm/s TAPSE (M-mode): 2.8 cm LEFT ATRIUM             Index       RIGHT ATRIUM           Index LA diam:        3.80 cm 1.93 cm/m  RA Area:     17.70 cm LA Vol (A2C):   84.7 ml 43.07 ml/m RA Volume:   45.10 ml  22.93 ml/m LA Vol (A4C):   60.7 ml 30.87 ml/m LA Biplane Vol: 90.6 ml 46.07 ml/m  AORTIC VALVE LVOT Vmax:   124.00 cm/s LVOT Vmean:  80.900 cm/s LVOT VTI:    0.241 m  AORTA Ao Root diam: 3.30 cm Ao Asc diam:  3.70 cm MITRAL VALVE MV Area (PHT): 2.87 cm    SHUNTS MV Decel Time: 264 msec    Systemic VTI:  0.24 m MV E velocity: 76.70 cm/s  Systemic Diam: 2.10 cm MV A velocity: 83.10 cm/s MV E/A ratio:  0.92 Rudean Haskell MD Electronically signed by Rudean Haskell MD Signature Date/Time: 07/30/2020/12:48:22 PM    Final    CT Angio Chest/Abd/Pel for Dissection W and/or Wo Contrast  Result Date: 07/29/2020 CLINICAL DATA:  Abdominal pain. Evaluate for abdominal aortic dissection. EXAM: CT ANGIOGRAPHY CHEST, ABDOMEN AND PELVIS TECHNIQUE: Non-contrast CT of the chest was initially obtained. Multidetector CT imaging through the chest, abdomen and pelvis was performed using the standard protocol during bolus administration of intravenous contrast. Multiplanar reconstructed images and MIPs were obtained and  reviewed to evaluate the vascular anatomy. CONTRAST:  12mL OMNIPAQUE IOHEXOL 350 MG/ML SOLN COMPARISON:  CT chest 08/07/2011 FINDINGS: CTA CHEST FINDINGS Cardiovascular: Preferential opacification of the thoracic aorta. No evidence of thoracic aortic aneurysm. Mild dilatation of the  transverse aortic arch measures 3.2 cm. Aberrant right subclavian artery. Cardiac enlargement. No pericardial effusion. Mediastinum/Nodes: No enlarged mediastinal, hilar, or axillary lymph nodes. Thyroid gland, trachea, and esophagus demonstrate no significant findings. Similar appearance of fluid attenuating structure within the posterior mediastinum at the level of the hiatus. This measures 2.7 x 2.5 cm, image 81/7. Previously this measured 2.5 x 2.2 cm. Lungs/Pleura: No pleural effusion. No airspace consolidation. There is a part solid nodule within the superior segment of left lower lobe measuring 2.2 by 1.6 cm with central solid component measuring 6 mm, image 34/9. Previously this was entirely non solid measuring 1.8 x 1.4 cm. Musculoskeletal: No acute or suspicious osseous abnormality. Spondylosis noted within the thoracic spine. Review of the MIP images confirms the above findings. CTA ABDOMEN AND PELVIS FINDINGS VASCULAR Aorta: Normal caliber aorta without aneurysm, dissection, vasculitis or significant stenosis. Aortic atherosclerosis. Celiac: Patent without evidence of aneurysm, dissection, vasculitis or significant stenosis. SMA: Patent without evidence of aneurysm, dissection, vasculitis or significant stenosis. Renals: Both renal arteries are patent without evidence of aneurysm, dissection, vasculitis, fibromuscular dysplasia or significant stenosis. IMA: Patent without evidence of aneurysm, dissection, vasculitis or significant stenosis. Inflow: Patent without evidence of aneurysm, dissection, vasculitis or significant stenosis. Veins: No obvious venous abnormality within the limitations of this arterial phase study. Review of the MIP images confirms the above findings. NON-VASCULAR Hepatobiliary: Several liver cysts are again noted and appears similar to previous exam. No suspicious liver abnormality. Multiple stones identified within the gallbladder which measures up to 9 mm. No gallbladder wall  thickening or pericholecystic fluid. No bile duct dilatation identified. Pancreas: Unremarkable. No pancreatic ductal dilatation or surrounding inflammatory changes. Spleen: Normal in size without focal abnormality. Adrenals/Urinary Tract: Left kidney cysts are noted. The largest arises from the upper pole measuring 3.2 cm. Additionally there are several, less than 1 cm, too small to reliably characterize renal hypodensities. There is a subtle, slightly exophytic soft tissue attenuating lesion arising off the medial cortex of the upper pole of left kidney. This measures 1.2 cm with mean Hounsfield units of 87, image 87/12 and image 101/7. No hydronephrosis identified bilaterally. Urinary bladder is unremarkable. Stomach/Bowel: The stomach is nondistended. The appendix is not confidently identified separate from the right lower quadrant bowel loops. No bowel wall thickening, inflammation, or distension. Lymphatic: There is mild hazy soft tissue within the porta hepatic region surrounding the replaced right hepatic artery measuring approximately 2.2 x 1.9 cm, image 103/7. No retroperitoneal, mesenteric, or pelvic adenopathy. Reproductive: Prostate gland is enlarged and has mass effect upon the base of bladder. Other: Left inguinal hernia contains nonobstructed loop of large bowel. No free fluid or fluid collections identified. 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 Well-defined, nonaggressive appearing sclerotic lesion within L5 is favored to represent a large bone island. This measures approximately 1.5 cm. No acute or suspicious osseous findings. Review of the MIP images confirms the above findings. IMPRESSION: 1. No evidence for aortic dissection or aneurysm. 2. There is mild hazy soft tissue within the porta hepatic region which encases the replaced right hepatic artery. Etiology is indeterminate. Differential considerations include confluent inflammation or infiltrative  neoplasm. In the absence of signs or symptoms of inflammation/infection further investigation with abdominal MRI  without and with contrast material is recommended. 3. There is a subtle, slightly exophytic soft tissue attenuating lesion arising off the medial cortex of the upper pole of left kidney. This is indeterminate and may represent a hemorrhagic/proteinaceous cyst versus solid enhancing neoplasm. Further evaluation with contrast enhanced abdominal MRI is recommended. 4. There is a part solid nodule within the superior segment of left lower lobe measuring 2.2 cm with central solid component measuring 6 mm. Previously this was entirely non solid measuring 1.8 x 1.4 cm. This is considered highly suspicious for low-grade indolent pulmonary neoplasm such as adenocarcinoma. This recommendation follows the consensus statement: Guidelines for Management of Incidental Pulmonary Nodules Detected on CT Images: From the Fleischner Society 2017; Radiology 2017; 284:228-243. 5. Aberrant right subclavian artery. 6. Gallstones. 7. Left inguinal hernia contains nonobstructed loop of large bowel. 8. Enlarged prostate gland with mass effect upon the base of bladder. 9. 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 10. Aortic atherosclerosis. Aortic Atherosclerosis (ICD10-I70.0). Electronically Signed   By: Kerby Moors M.D.   On: 07/29/2020 15:17   VAS US CAROTID  Result Date: 08/02/2020 Carotid Arterial Duplex Study Indications:       CVA. Risk Factors:      Hypertension, hyperlipidemia. Comparison Study:  no prior Performing Technologist: Abram Sander RVS  Examination Guidelines: A complete evaluation includes B-mode imaging, spectral Doppler, color Doppler, and power Doppler as needed of all accessible portions of each vessel. Bilateral testing is considered an integral part of a complete examination. Limited examinations for reoccurring indications may be performed as noted.  Right Carotid Findings:  +----------+--------+--------+--------+------------------+--------+           PSV cm/sEDV cm/sStenosisPlaque DescriptionComments +----------+--------+--------+--------+------------------+--------+ CCA Prox  73      7               heterogenous               +----------+--------+--------+--------+------------------+--------+ CCA Distal68      7               heterogenous               +----------+--------+--------+--------+------------------+--------+ ICA Prox  69      15      1-39%   heterogenous               +----------+--------+--------+--------+------------------+--------+ ICA Distal64      19                                         +----------+--------+--------+--------+------------------+--------+ ECA       135     4                                          +----------+--------+--------+--------+------------------+--------+ +----------+--------+-------+--------+-------------------+           PSV cm/sEDV cmsDescribeArm Pressure (mmHG) +----------+--------+-------+--------+-------------------+ Subclavian89                                         +----------+--------+-------+--------+-------------------+ +---------+--------+--+--------+-+---------+ VertebralPSV cm/s53EDV cm/s9Antegrade +---------+--------+--+--------+-+---------+  Left Carotid Findings: +----------+--------+--------+--------+------------------+--------+           PSV cm/sEDV cm/sStenosisPlaque DescriptionComments +----------+--------+--------+--------+------------------+--------+ CCA Prox  70      12  heterogenous               +----------+--------+--------+--------+------------------+--------+ CCA Distal83      11              heterogenous               +----------+--------+--------+--------+------------------+--------+ ICA Prox  70      19      1-39%   heterogenous               +----------+--------+--------+--------+------------------+--------+  ICA Distal88      22                                         +----------+--------+--------+--------+------------------+--------+ ECA       182                                                +----------+--------+--------+--------+------------------+--------+ +----------+--------+--------+--------+-------------------+           PSV cm/sEDV cm/sDescribeArm Pressure (mmHG) +----------+--------+--------+--------+-------------------+ [id number]                                          +----------+--------+--------+--------+-------------------+ +---------+--------+--+--------+--+---------+ VertebralPSV cm/s60EDV cm/s10Antegrade +---------+--------+--+--------+--+---------+   Summary: Right Carotid: Velocities in the right ICA are consistent with a 1-39% stenosis. Left Carotid: Velocities in the left ICA are consistent with a 1-39% stenosis. Vertebrals: Bilateral vertebral arteries demonstrate antegrade flow. *See table(s) above for measurements and observations.  Electronically signed by Antony Contras MD on 08/02/2020 at 1:01:08 PM.    Final     Labs: BNP (last 3 results) No results for input(s): BNP in the last 8760 hours. Basic Metabolic Panel: Recent Labs  Lab 07/30/20 0513 07/31/20 0127 08/01/20 1321 08/02/20 0830 08/03/20 0546  NA 138 137 136 135 140  K 3.9 4.1 4.1 3.9 3.8  CL 105 103 105 102 108  CO2 24 23 22  21* 22  GLUCOSE 104* 111* 100* 124* 103*  BUN 21 25* 32* 34* 28*  CREATININE 1.78* 2.05* 2.34* 2.21* 1.83*  CALCIUM 9.0 9.2 8.8* 9.2 8.9   Liver Function Tests: Recent Labs  Lab 07/29/20 1204  AST 34  ALT 8  ALKPHOS 77  BILITOT 0.7  PROT 7.6  ALBUMIN 3.5   Recent Labs  Lab 07/29/20 1204  LIPASE 36   No results for input(s): AMMONIA in the last 168 hours. CBC: Recent Labs  Lab 07/29/20 1204 07/29/20 1248 07/30/20 0513 07/30/20 1338 08/01/20 1321 08/02/20 0109 08/02/20 0830 08/02/20 1331 08/03/20 0546 08/03/20 1727 08/04/20 0132  08/04/20 1224  WBC 6.1  --  6.3  --  7.3  --  8.0  --  6.0  --   --   --   NEUTROABS 4.6  --   --   --   --   --   --   --   --   --   --   --   HGB 8.3*   < > 7.8*   < > 8.4*   < > 9.0* 8.7* 8.4* 9.0* 7.6* 8.8*  HCT 27.8*   < > 25.5*   < > 27.3*   < > 28.9* 27.7* 27.9* 29.9* 24.9*  30.0*  MCV 74.5*  --  72.0*  --  73.2*  --  73.4*  --  74.2*  --   --   --   PLT 235  --  248  --  201  --  211  --  205  --   --   --    < > = values in this interval not displayed.   Cardiac Enzymes: No results for input(s): CKTOTAL, CKMB, CKMBINDEX, TROPONINI in the last 168 hours. BNP: Invalid input(s): POCBNP CBG: Recent Labs  Lab 07/29/20 1300  GLUCAP 82   D-Dimer No results for input(s): DDIMER in the last 72 hours. Hgb A1c No results for input(s): HGBA1C in the last 72 hours. Lipid Profile No results for input(s): CHOL, HDL, LDLCALC, TRIG, CHOLHDL, LDLDIRECT in the last 72 hours. Thyroid function studies No results for input(s): TSH, T4TOTAL, T3FREE, THYROIDAB in the last 72 hours.  Invalid input(s): FREET3 Anemia work up No results for input(s): VITAMINB12, FOLATE, FERRITIN, TIBC, IRON, RETICCTPCT in the last 72 hours. Urinalysis    Component Value Date/Time   COLORURINE STRAW (A) 07/29/2020 1154   APPEARANCEUR CLEAR 07/29/2020 1154   LABSPEC 1.016 07/29/2020 1154   PHURINE 7.0 07/29/2020 1154   GLUCOSEU NEGATIVE 07/29/2020 1154   HGBUR NEGATIVE 07/29/2020 1154   BILIRUBINUR NEGATIVE 07/29/2020 1154   KETONESUR NEGATIVE 07/29/2020 1154   PROTEINUR 100 (A) 07/29/2020 1154   UROBILINOGEN 0.2 12/06/2013 0339   NITRITE NEGATIVE 07/29/2020 1154   LEUKOCYTESUR NEGATIVE 07/29/2020 1154   Sepsis Labs Invalid input(s): PROCALCITONIN,  WBC,  LACTICIDVEN Microbiology Recent Results (from the past 240 hour(s))  SARS CORONAVIRUS 2 (TAT 6-24 HRS) Nasopharyngeal Nasopharyngeal Swab     Status: None   Collection Time: 07/29/20  5:48 PM   Specimen: Nasopharyngeal Swab  Result Value Ref Range  Status   SARS Coronavirus 2 NEGATIVE NEGATIVE Final    Comment: (NOTE) SARS-CoV-2 target nucleic acids are NOT DETECTED.  The SARS-CoV-2 RNA is generally detectable in upper and lower respiratory specimens during the acute phase of infection. Negative results do not preclude SARS-CoV-2 infection, do not rule out co-infections with other pathogens, and should not be used as the sole basis for treatment or other patient management decisions. Negative results must be combined with clinical observations, patient history, and epidemiological information. The expected result is Negative.  Fact Sheet for Patients: SugarRoll.be  Fact Sheet for Healthcare Providers: https://www.woods-mathews.com/  This test is not yet approved or cleared by the [place] FDA and  has been authorized for detection and/or diagnosis of SARS-CoV-2 by FDA under an Emergency Use Authorization (EUA). This EUA will remain  in effect (meaning this test can be used) for the duration of the COVID-19 declaration under Se ction 564(b)(1) of the Act, 21 U.S.C. section 360bbb-3(b)(1), unless the authorization is terminated or revoked sooner.  Performed at [facility], [place] [address] [place], [place] [address]   MRSA PCR Screening     Status: None   Collection Time: 07/29/20  8:34 PM   Specimen: Nasal Mucosa; Nasopharyngeal  Result Value Ref Range Status   MRSA by PCR NEGATIVE NEGATIVE Final    Comment:        The GeneXpert MRSA Assay (FDA approved for NASAL specimens only), is one component of a comprehensive MRSA colonization surveillance program. It is not intended to diagnose MRSA infection nor to guide or monitor treatment for MRSA infections. Performed at [facility], [place],  [place] [address]      Time coordinating discharge: 35 minutes  SIGNED: Antonieta Pert, MD  [facility] 08/04/2020, 1:55 PM  If 7PM-7AM, please  contact night-coverage [email or web address]

## 2020-08-04 NOTE — Progress Notes (Signed)
Refused SCDs.  Isaac Phelps he is not going to allow [place] to put them on him.  I told him the benefits and risks and he said ok, noted.  He said he is never going to wear them. I did notify MD because he does not any other form of VTE.  Pt said when his time is up, it is up and he is not worried.

## 2020-08-04 NOTE — Plan of Care (Signed)

## 2020-08-04 NOTE — TOC Transition Note (Signed)
Transition of Care [facility]) - CM/SW Discharge Note   Patient Details  Name: Isaac Phelps MRN: [id number] Date of Birth: 1930/10/28  [facility] CM/SW Contact:  Pollie Friar, RN Phone Number: 08/04/2020, 2:16 PM   Clinical Narrative:    Pt is discharging home with self care. No needs per TOC.   Final next level of care: Home/Self Care Barriers to Discharge: No Barriers Identified   Patient Goals and CMS Choice        Discharge Placement                       Discharge Plan and Services                                     Social Determinants of Health (SDOH) Interventions     Readmission Risk Interventions No flowsheet data found.

## 2020-08-05 ENCOUNTER — Telehealth: Payer: Self-pay | Admitting: Adult Health

## 2020-08-05 NOTE — Telephone Encounter (Signed)
Transition Care Management Unsuccessful Follow-up Telephone Call  Date of discharge and from where:  08/04/2020 from [facility]   Attempts:  1st Attempt  Reason for unsuccessful TCM follow-up call:  Unable to leave message, voicemail not set up

## 2020-08-11 ENCOUNTER — Other Ambulatory Visit: Payer: Self-pay

## 2020-08-11 ENCOUNTER — Inpatient Hospital Stay: Payer: Medicare Other | Attending: Oncology | Admitting: Oncology

## 2020-08-11 ENCOUNTER — Telehealth: Payer: Self-pay | Admitting: Oncology

## 2020-08-11 VITALS — BP 169/62 | HR 82 | Temp 98.1°F | Resp 17 | Ht 71.0 in | Wt 177.7 lb

## 2020-08-11 DIAGNOSIS — N2889 Other specified disorders of kidney and ureter: Secondary | ICD-10-CM | POA: Insufficient documentation

## 2020-08-11 DIAGNOSIS — I2510 Atherosclerotic heart disease of native coronary artery without angina pectoris: Secondary | ICD-10-CM | POA: Insufficient documentation

## 2020-08-11 DIAGNOSIS — C20 Malignant neoplasm of rectum: Secondary | ICD-10-CM | POA: Insufficient documentation

## 2020-08-11 DIAGNOSIS — D509 Iron deficiency anemia, unspecified: Secondary | ICD-10-CM | POA: Insufficient documentation

## 2020-08-11 DIAGNOSIS — R918 Other nonspecific abnormal finding of lung field: Secondary | ICD-10-CM | POA: Diagnosis not present

## 2020-08-11 DIAGNOSIS — I10 Essential (primary) hypertension: Secondary | ICD-10-CM | POA: Insufficient documentation

## 2020-08-11 DIAGNOSIS — Z87891 Personal history of nicotine dependence: Secondary | ICD-10-CM | POA: Insufficient documentation

## 2020-08-11 DIAGNOSIS — N400 Enlarged prostate without lower urinary tract symptoms: Secondary | ICD-10-CM | POA: Insufficient documentation

## 2020-08-11 DIAGNOSIS — Z8673 Personal history of transient ischemic attack (TIA), and cerebral infarction without residual deficits: Secondary | ICD-10-CM | POA: Insufficient documentation

## 2020-08-11 DIAGNOSIS — E785 Hyperlipidemia, unspecified: Secondary | ICD-10-CM | POA: Insufficient documentation

## 2020-08-11 NOTE — Telephone Encounter (Signed)
Scheduled appt per 3/10 los. Spoke to patient who is aware of appt date and time. Gave patient calendar print out.

## 2020-08-11 NOTE — Progress Notes (Signed)
Met with patient who presented alone for initial medical oncology consult with Dr. Julieanne Manson.  I explained my role as nurse navigator and he was given my card with my direct contact information.  During the consult his daughter [name] was placed on speaker phone.  They verbalized an understanding of the plan to obtain MRI of the pelvis and be referred to Dr. Dema Severin for surgical consideration.   I reviewed with him that Dr. Benay Spice would like him to take over the counter Ferrous Sulfate 325 mg one twice daily as well as use Miralax daily while taking the iron supplement.  I have written all this information down for him and his daughter verbalized an understanding of the instructions that have been given.  He is requesting the his daughter be contacted with his appointments as he does not have voicemail.  I have noted this in the chart.   I have referred the patient to Dr. Annye English for surgical consideration.

## 2020-08-11 NOTE — Progress Notes (Signed)
Patient called back and accepted appointment this afternoon at 2:00 pm with Dr. Benay Spice.  I asked that they arrive by 1:45 and one person may accompany him for the consult.

## 2020-08-11 NOTE — Progress Notes (Signed)
Patient presents to office for new patient appointment today unaccompanied. He is married to Isaac Phelps, who is now in SNF ([place]). He has one daughter, Isaac Phelps who was not able to come in. She was called and put on speaker phone with Dr. Benay Spice and patient. 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 He reports his main activities are watching TV and visiting his wife. He denies any nutrition or physical issues. He still drives and denies any transportation issues. He was provided office information as well as location of new center. His PCP office is in that area and he will be able to continue his care there.

## 2020-08-11 NOTE — Progress Notes (Signed)
Attempted to reach patient on his cell number no answer and no voicemail box set up.  I called his daughter Isaac Phelps listed in the chart and it went directly to voice mail.  I left her a message regarding referral we had received for patient to be seen by medical oncology.  I informed her that Dr. Benay Spice does have an opening this afternoon to arrive by 1:45 and asked that she return my call on my direct number.

## 2020-08-11 NOTE — Progress Notes (Signed)
[facility] New Patient Consult   Requesting MD: Dorothyann Peng, [place],  [place] [address]   Isaac Phelps 87 y.o.  03-06-1931    Reason for Consult: Rectal cancer   HPI: Isaac Phelps was admitted to the hospital 07/29/2020 with chest pain.  He also reported left facial numbness.  He was diagnosed with small acute left frontal, left occipital, and right periatrial white matter infarcts.  He was started on aspirin therapy.  He was noted to have microcytic anemia.  Gastroenterology was consulted.  He was taken to a colonoscopy by Dr. Carlean Purl on 08/03/2020.  A mass was palpated on digital rectal exam.  A mass was found in the distal rectum, noncircumferential.  The mass measured 3 cm in length with oozing present.  The mass was biopsied.  No other abnormality.  The pathology revealed adenocarcinoma with signet ring features.  CT geography of the chest, abdomen, and pelvis revealed cardiac enlargement.  Similar appearance of a fluid attenuation structure in the posterior mediastinum compared to a CT from 2013.  A partial solid nodule was noted in the superior segment of the left lower lobe measuring 2.2 x 1.6 cm, previously this area measured 1.8 x 1.4 cm.  Several liver cyst.  Multiple gallstones.  Subtle exophytic soft tissue attenuation lesion at the upper pole of the left kidney.  Mild hazy soft tissue noted in the porta hepatis measuring 2.2 x 1.9 cm.  Enlargement of prostate gland with mass-effect on the base of the bladder.  Left inguinal hernia.  An MRI of the abdomen 07/30/2020 revealed no suspicious liver lesion.  There is evidence of hemosiderosis in the liver and spleen.  No enlarged abdominal or pelvic lymph nodes.  No mass or abnormal enhancement in the porta hepatis.  Suspicion for small hypoenhancing neoplasms in the upper pole of the left kidney and in the interpolar region of the right kidney.    Past Medical History:  Diagnosis Date  .  Adenocarcinoma of rectum ([place])  08/03/2020  . Anxiety   . BENIGN PROSTATIC HYPERTROPHY 04/01/2008  . Coronary artery disease   . GERD (gastroesophageal reflux disease)   . HYPERLIPIDEMIA 05/26/2007  . HYPERTENSION 02/07/2007  . Syncope and collapse   . TOBACCO ABUSE 04/01/2008    .  CVA  Past Surgical History:  Procedure Laterality Date  . APPENDECTOMY    . BIOPSY  08/03/2020   Procedure: BIOPSY;  Surgeon: Gatha Mayer, MD;  Location: [facility];  Service: Endoscopy;;  . COLONOSCOPY WITH PROPOFOL N/A 08/03/2020   Procedure: COLONOSCOPY WITH PROPOFOL;  Surgeon: Gatha Mayer, MD;  Location: [facility];  Service: Endoscopy;  Laterality: N/A;  . ESOPHAGOGASTRODUODENOSCOPY (EGD) WITH PROPOFOL N/A 08/03/2020   Procedure: ESOPHAGOGASTRODUODENOSCOPY (EGD) WITH PROPOFOL;  Surgeon: Gatha Mayer, MD;  Location: [facility];  Service: Endoscopy;  Laterality: N/A;  . TONSILLECTOMY      Medications: Reviewed  Allergies: No Known Allergies  Family history: No family history cancer  Social History:   He lives alone in [place].  His wife is in a skilled nursing facility.  He is retired from a trucking dock.  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  He quit smoking cigarettes following the March 2022 hospital admission.  No alcohol use.  No transfusion history.  No risk factor for HIV or hepatitis.  ROS:   Positives include: Discomfort at the left low anterior chest wall/upper abdomen, left facial  numbness, mild weakness of the left hand, rectal bleeding  A complete ROS was otherwise negative.  Physical Exam:  Blood pressure (!) 169/62, pulse 82, temperature 98.1 F (36.7 C), temperature source Tympanic, resp. rate 17, height 5\' 11"  (1.803 m), weight 177 lb 11.2 oz (80.6 kg), SpO2 100 %.  HEENT: Neck without mass Lungs: Scattered inspiratory bronchial sounds, no respiratory distress Cardiac: Regular rate and rhythm, 2/6 systolic murmur Abdomen:  Nontender, no mass, no hepatosplenomegaly, bilateral inguinal hernias GU: Testes without mass Vascular: Trace pitting edema at the ankle bilaterally Lymph nodes: No cervical, supraclavicular, axillary, or inguinal nodes Neurologic: Alert and oriented, motor exam appears intact in the upper and lower extremities bilaterally, mild left facial droop Skin: No rash Musculoskeletal: No spine tenderness Rectal: Normal tone, a mass is palpable approximately 1 cm proximal to the anal verge at the anterior rectum centered to the right of midline.  The mass is noncircumferential.   LAB:  CBC  Lab Results  Component Value Date   WBC 6.0 08/03/2020   HGB 8.8 (L) 08/04/2020   HCT 30.0 (L) 08/04/2020   MCV 74.2 (L) 08/03/2020   PLT 205 08/03/2020   NEUTROABS 4.6 07/29/2020        CMP  Lab Results  Component Value Date   NA 140 08/03/2020   K 3.8 08/03/2020   CL 108 08/03/2020   CO2 22 08/03/2020   GLUCOSE 103 (H) 08/03/2020   BUN 28 (H) 08/03/2020   CREATININE 1.83 (H) 08/03/2020   CALCIUM 8.9 08/03/2020   PROT 7.6 07/29/2020   ALBUMIN 3.5 07/29/2020   AST 34 07/29/2020   ALT 8 07/29/2020   ALKPHOS 77 07/29/2020   BILITOT 0.7 07/29/2020   GFRNONAA 35 (L) 08/03/2020   GFRAA 42 (L) 02/14/2015   PSA 38 on 08/01/2020   Imaging:  As per HPI-CT images reviewed with Isaac Phelps   Assessment/Plan:   1. Rectal cancer  Colonoscopy 08/03/2020-distal rectal mass abutting the anus, biopsy confirmed adenocarcinoma  CT angiography 07/29/2020-hazy soft tissue at the porta hepatis encasing the right hepatic artery, exophytic soft tissue lesion at the upper pole of the left kidney-indeterminate, partially solid nodule in the left lower lobe-present on a CT in 2013 suspicious, suspicious for a low-grade pulmonary neoplasm, enlarged prostate,  MRI abdomen 07/30/2020-no evidence of mass in the porta hepatis, hypoenhancing lesions in the upper pole of the left kidney and interpolar region of the  right kidney suspicious for small neoplasms, hemosiderosis of the liver and spleen 2. Enlarged prostate with an elevated PSA 3. 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 Hypertension 5. Gout 6. History of coronary artery disease 7. Iron deficiency anemia   Disposition:   Isaac Phelps has been diagnosed with rectal cancer.  He appears to have disease localized to the rectum based on the staging evaluation to date.  The partially solid left lung lesion is most likely an indolent pulmonary neoplasm.  I discussed the treatment of rectal cancer with Isaac Phelps.  His daughter was present by telephone for today's visit.  We made a referral to a colorectal surgeon.  He will undergo a staging pelvic MRI.  He will return for an office visit after the MRI to discuss treatment options.  I will present his case at the GI tumor conference.  We will consider neoadjuvant therapy and surgical resection.  Mr. Stipp does not appear to have hereditary nonpolyposis colon cancer syndrome, but his family members are at increased risk of developing  colorectal cancer and should receive appropriate screening.  He will begin oral iron therapy for treatment of the iron deficiency anemia.  We will check a CBC and CEA when he returns in 2 weeks.  Betsy Coder, MD  08/11/2020, 3:17 PM

## 2020-08-15 ENCOUNTER — Telehealth: Payer: Self-pay

## 2020-08-15 NOTE — Telephone Encounter (Signed)
Spoke with patient's daughter [name] regarding MRI scheduled for tomorrow 3/15 at 7:30 pm at [facility], I had attempted to find a different time slot at [facility], [facility], [facility] or [facility] within our time frame but there was no availability.  She verbalized an understanding and will let the patient know.

## 2020-08-16 ENCOUNTER — Ambulatory Visit (HOSPITAL_COMMUNITY)
Admission: RE | Admit: 2020-08-16 | Discharge: 2020-08-16 | Disposition: A | Discharge status: Home / Self Care | Payer: Medicare Other | Source: Ambulatory Visit | Attending: Oncology | Admitting: Oncology

## 2020-08-16 DIAGNOSIS — C20 Malignant neoplasm of rectum: Secondary | ICD-10-CM | POA: Diagnosis present

## 2020-08-16 IMAGING — MR MR PELVIS WO/W CM
9 series · 47 of 48 positions shown · IV contrast (gadavist)
Comparison: Pelvic CT of [WT]. Colonoscopy report [DATE]. This
describes a distal anorectal mass

CLINICAL DATA: Staging of rectal cancer.

EXAM:
MRI PELVIS WITHOUT AND WITH CONTRAST
TECHNIQUE: Multiplanar multisequence MR imaging of the pelvis was performed
both before and after administration of intravenous contrast. Small
amount of US gel was administered per rectum to optimize tumor
evaluation.
CONTRAST:  8mL GADAVIST GADOBUTROL 1 MMOL/ML IV SOLN

[Series 2: T2 · sagittal · 3.0mm · 0.74mm/px · 5 of 40 slices shown (1 of 5)]
[im 1/40]
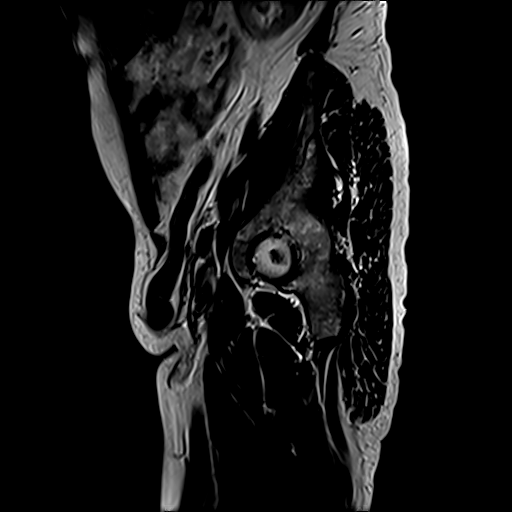
[im 10/40]
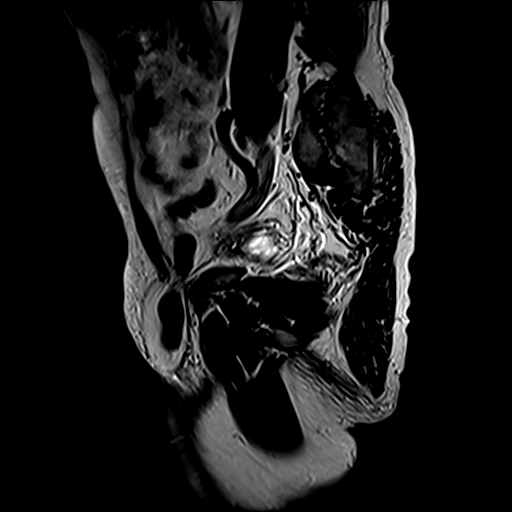
[im 20/40]
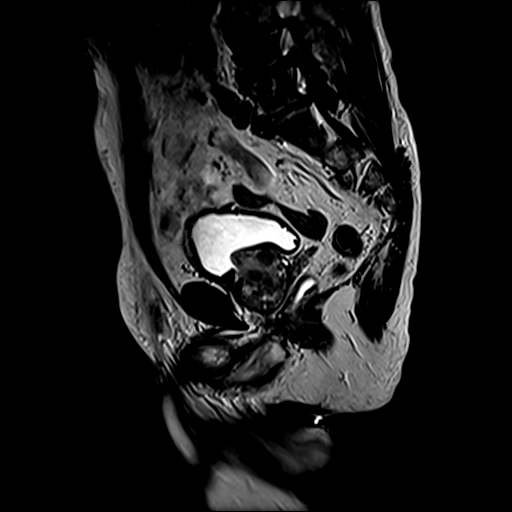
[im 30/40]
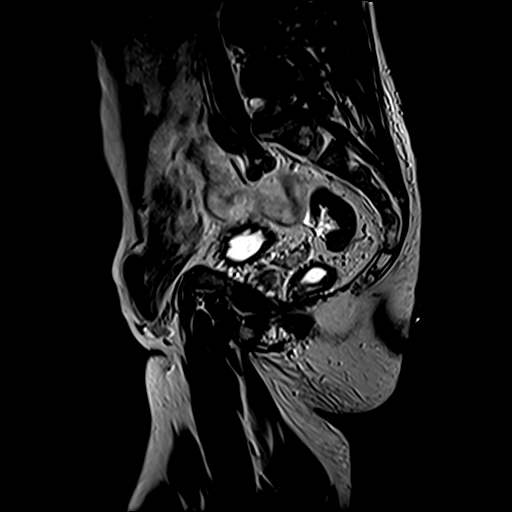
[im 40/40]
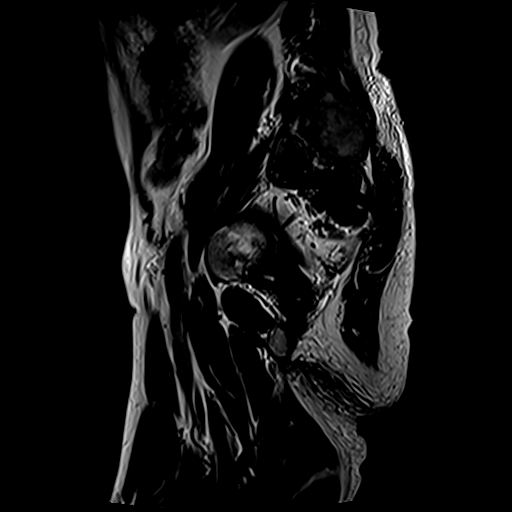

[Series 3: T2 · axial · 5.0mm · 0.87mm/px · z∈[-60,+138]mm · 4 of 34 slices shown (2 of 5)]
[im 1/34]
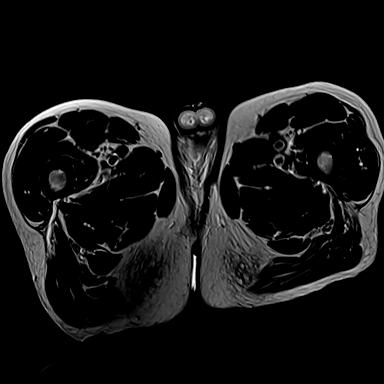
[im 12/34]
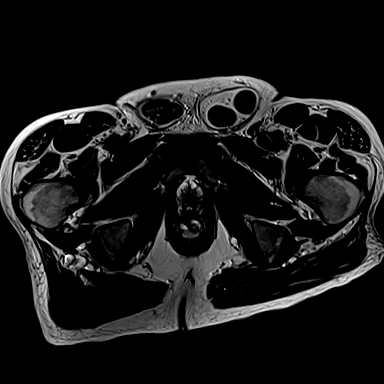
[im 23/34]
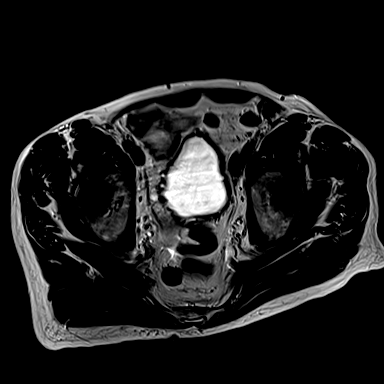
[im 34/34]
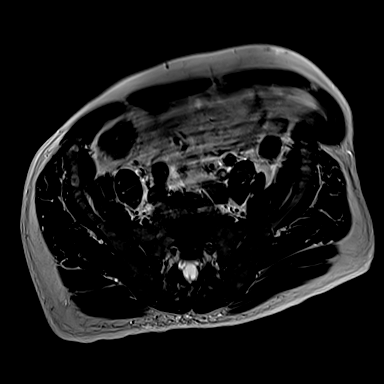

[Series 4: T2 · coronal · 3.0mm · 0.70mm/px · 5 of 47 slices shown (3 of 5)]
[im 1/47]
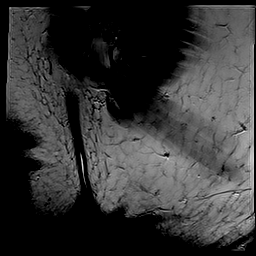
[im 12/47]
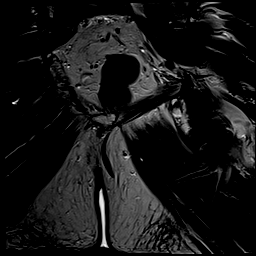
[im 24/47]
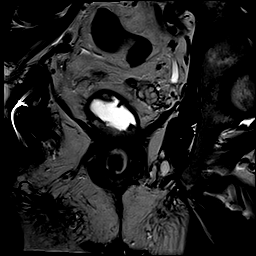
[im 35/47]
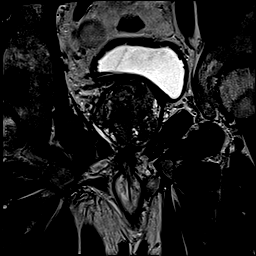
[im 47/47]
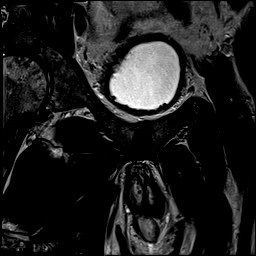

[Series 5: DWI · axial · 5.0mm · 1.48mm/px · z∈[-78,+132]mm · 8 of 72 slices shown (1 of 2)]
[im 1/72]
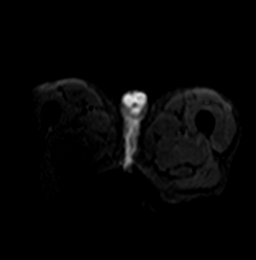
[im 11/72]
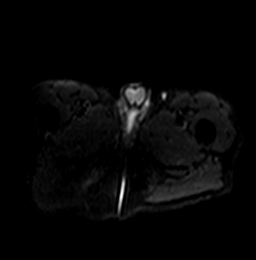
[im 21/72]
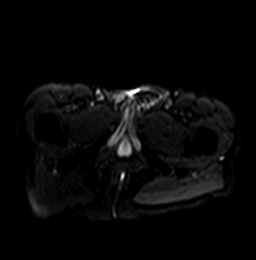
[im 31/72]
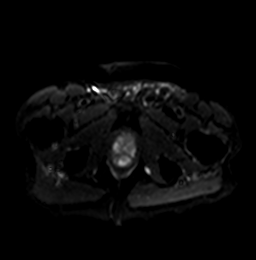
[im 41/72]
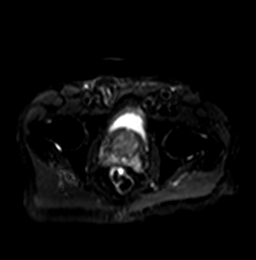
[im 51/72]
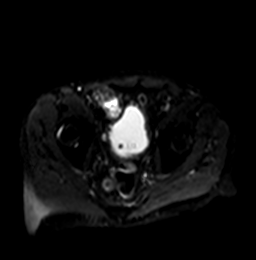
[im 61/72]
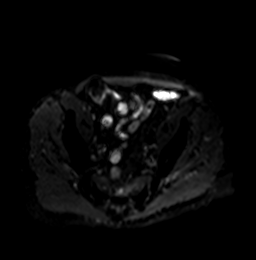
[im 72/72]
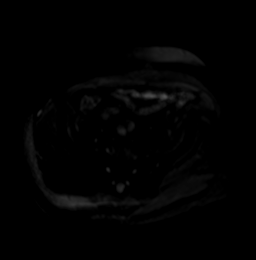

[Series 6: DWI · axial · 5.0mm · 1.48mm/px · z∈[-78,+132]mm · 4 of 36 slices shown (2 of 2)]
[im 1/36]
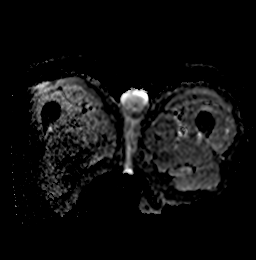
[im 12/36]
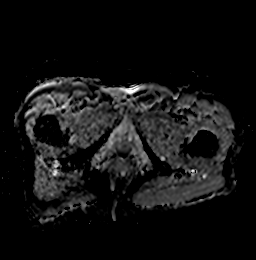
[im 24/36]
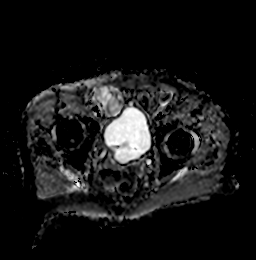
[im 36/36]
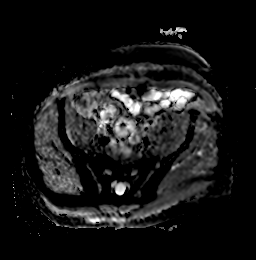

[Series 8: T2 · coronal · 3.0mm · 0.70mm/px · 4 of 34 slices shown (4 of 5)]
[im 1/34]
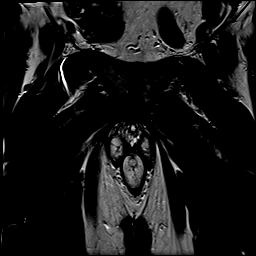
[im 12/34]
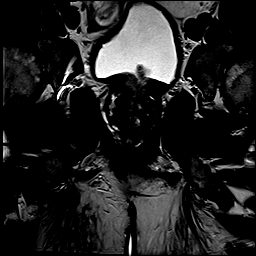
[im 23/34]
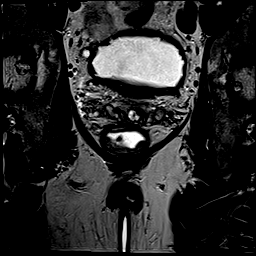
[im 34/34]
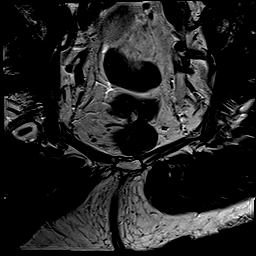

[Series 9: T2 · axial · 3.0mm · 0.86mm/px · z∈[-137,-13]mm · 6 of 52 slices shown (5 of 5)]
[im 1/52]
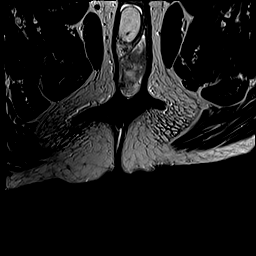
[im 11/52]
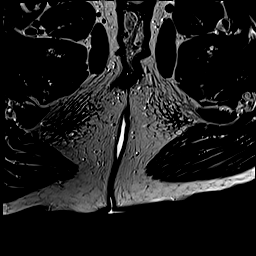
[im 21/52]
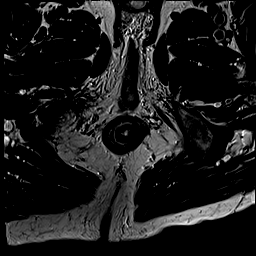
[im 31/52]
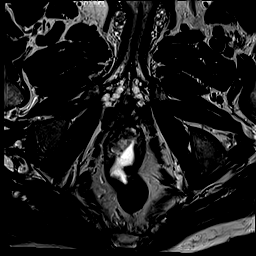
[im 41/52]
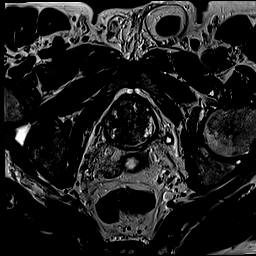
[im 52/52]
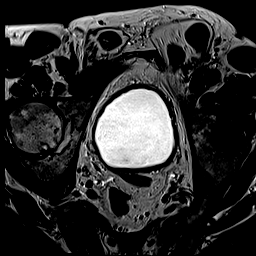

[Series 11: T1 fat-sat · axial · non-contrast · 3.0mm · 0.86mm/px · z∈[-137,-13]mm · 6 of 52 slices shown]
[im 1/52]
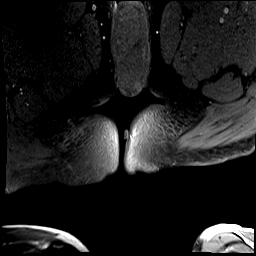
[im 11/52]
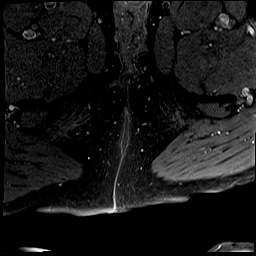
[im 21/52]
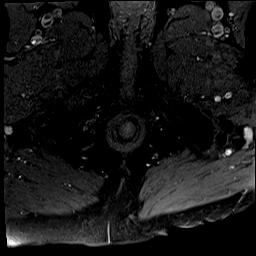
[im 31/52]
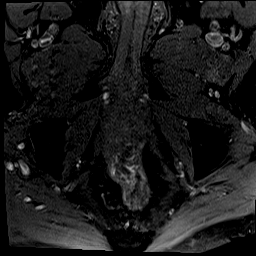
[im 41/52]
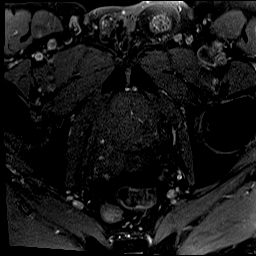
[im 52/52]
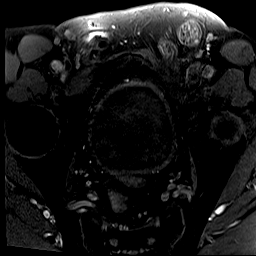

[Series 12: T1 fat-sat post-contrast · axial · 3.0mm · 0.86mm/px · z∈[-137,-39]mm · 5 of 52 slices shown]
[im 1/52]
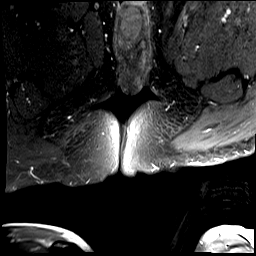
[im 11/52]
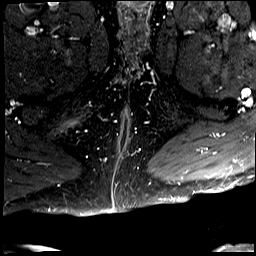
[im 21/52]
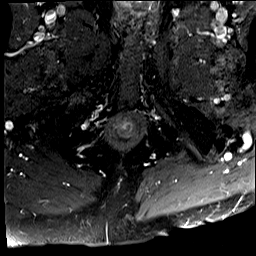
[im 31/52]
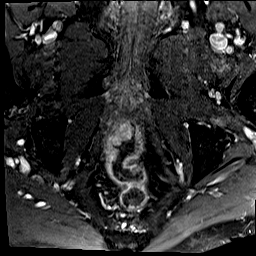
[im 41/52]
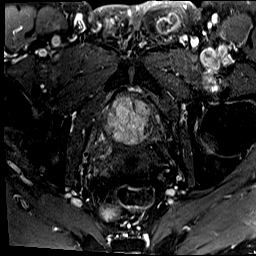

[47 of 48 positions shown; findings below may reference images not displayed]

FINDINGS: TUMOR LOCATION

Tumor distance from Anal Verge/Skin Surface: 5.3 cm on [DATE].

Tumor distance from Internal Anal Sphincter: Minimal if any distance
between the inferior aspect of the mass in the internal sphincters.
Example on [DATE].

TUMOR DESCRIPTION

Circumferential Extent: Centered about the 10 o'clock position, less
than 180 degrees including on [DATE] and [DATE]. Also [DATE] and 22-28/12.

Tumor Length: 3.4 cm including on [DATE].

T - CATEGORY

Extension through Muscularis Propria: No

Shortest Distance of any tumor/node from Mesorectal Fascia: Not
applicable (low rectal/anorectal junction)

Extramural Vascular Invasion/Tumor Thrombus: No

Invasion of Anterior Peritoneal Reflection: No

Involvement of Adjacent Organs or Pelvic Sidewall: No

Levator Ani Involvement: Tumor is intimately associated with the
sphincters.

N - CATEGORY

Mesorectal Lymph Nodes >=5mm: None

Extra-mesorectal Lymphadenopathy: No

Other: Left sided inguinal hernia containing sigmoid colon. A
right-sided inguinal hernia contains a portion of the cecum.
Prostatomegaly with median lobe impression into the urinary bladder.
No significant free fluid. Lumbosacral spondylosis.
IMPRESSION: Rectal adenocarcinoma T stage: T2

Rectal adenocarcinoma N stage:  N0

Minimal if any distance from tumor to internal anal sphincter.

## 2020-08-16 MED ORDER — GADOBUTROL 1 MMOL/ML IV SOLN
8.0000 mL | Freq: Once | INTRAVENOUS | Status: AC | PRN
  Administered 2020-08-16: 8 mL via INTRAVENOUS

## 2020-08-18 ENCOUNTER — Ambulatory Visit (INDEPENDENT_AMBULATORY_CARE_PROVIDER_SITE_OTHER): Payer: Medicare Other | Admitting: Adult Health

## 2020-08-18 ENCOUNTER — Encounter: Payer: Self-pay | Admitting: Adult Health

## 2020-08-18 ENCOUNTER — Other Ambulatory Visit: Payer: Self-pay

## 2020-08-18 VITALS — BP 170/60 | HR 89 | Temp 98.2°F | Ht 71.0 in | Wt 176.6 lb

## 2020-08-18 DIAGNOSIS — I6310 Cerebral infarction due to embolism of unspecified precerebral artery: Secondary | ICD-10-CM

## 2020-08-18 DIAGNOSIS — N179 Acute kidney failure, unspecified: Secondary | ICD-10-CM

## 2020-08-18 DIAGNOSIS — N400 Enlarged prostate without lower urinary tract symptoms: Secondary | ICD-10-CM

## 2020-08-18 DIAGNOSIS — D500 Iron deficiency anemia secondary to blood loss (chronic): Secondary | ICD-10-CM

## 2020-08-18 DIAGNOSIS — C20 Malignant neoplasm of rectum: Secondary | ICD-10-CM | POA: Diagnosis not present

## 2020-08-18 DIAGNOSIS — I10 Essential (primary) hypertension: Secondary | ICD-10-CM | POA: Diagnosis not present

## 2020-08-18 NOTE — Patient Instructions (Addendum)
It was great seeing you today   I will refer you to a Neurologist for your strokes  I will keep an eye on your blood pressure   Let me know if you need anything

## 2020-08-18 NOTE — Progress Notes (Signed)
Subjective:    Patient ID: Isaac Phelps, male    DOB: 05/29/1931, 87 y.o.   MRN: [id number]  HPI 87 year old male who  has a past medical history of Adenocarcinoma of rectum ([place]), Anxiety, BENIGN PROSTATIC HYPERTROPHY (04/01/2008), Coronary artery disease, GERD (gastroesophageal reflux disease), HYPERLIPIDEMIA (05/26/2007), HYPERTENSION (02/07/2007), Syncope and collapse, and TOBACCO ABUSE (04/01/2008).  He presents to the office today for TCM visit  Admit Date 07/29/2020 Discharge Date 08/03/2020  Presented to the emergency room with chest pain/rib pain and epigastric pain for 1 month.  He also reported old left-sided facial numbness.  An MRI was obtained and revealed multiple small strokes.  Neurology was consulted.  Neurology recommended to start aspirin when cleared by GI as the patient was found to have low hemoglobin, history of melena, occult blood positive.   He had an endoscopy/colonoscopy on 08-03-20.  His EGD was normal but his colonoscopy revealed a distal rectal mass abutting anus, likely malignant tumor was oozing blood.  His biopsy came back positive for signet ring malignancy.  Patient had imaging CT scan and MRI which showed left lower lobe 2.2 cm solid lesion, right kidney lesion, which could be metastatic disease versus did additional malignancy.  We will set up with outpatient evaluation with oncology  Discharge Diagnosis   1. Acute left frontal occipital parietal infarct/late subacute -completed stroke work-up, was continued on aspirin 81 mg, Zocor was increased to 80 mg.  Advise outpatient neurology follow-up  2.  Rectal carcinoma with 2.2 cm left lower lobe nodule, right kidney lesion-need outpatient oncology follow-up  Mildly elevated troponin-please from malignant hypertension, his echo was normal  Hypertensive Emergency -has been noncompliant with medications in the past.  He was stabilized during his hospital admission with amlodipine, Coreg, and hydralazine.  Advised  outpatient PCP follow-up  Iron deficiency anemia/GI bleeding with melena-1 unit packed red blood cells and an iron infusion while admitted.  Underwent EGD colonoscopy which showed rectal mass.  Follow-up CBC in 1 week  Enlarged Prostate - -BSA was 38.  Flomax was started. outpatient urology follow-up advised  AKI on CKD stage IIIb -no function stable at 1.8 upon discharge, this improved from 2.3 on admission  He was seen by Dr. Benay Spice with oncology on 08/11/2020.  He believes that the disease appears to be localized to the rectum based on the staging evaluation.  Partially solid left lung lesion is most likely an indolent pulmonary neoplasm.  Has been referred to colorectal surgery and underwent staging MRI of the pelvis on 315 which showed a rectal adenocarcinoma T2, N0  He was started on oral iron therapy to help treat his iron deficiency anemia.  He has a follow-up appointment next week.  ------------------------------------------------------------------------------------------------------- Today he reports that he is in good spirits, is happy to be following up with his oncologist and has been seen by urology.  He does report taking his blood pressure medications "every day", but did not take them this morning prior to his exam.  Today in the office his blood pressure is elevated at 170/60.  He has not been monitoring his blood pressure at home.  Denies strokelike symptoms since being discharged.  Has not been contacted by neurology yet but it does not look like this referral was placed, we will go ahead and place it today.  He has started his iron supplement.  Has follow-up CBC and CMP next week prior to seeing oncology.  Review of Systems  Constitutional: Negative.   HENT:  Negative.   Eyes: Negative.   Respiratory: Negative.   Cardiovascular: Negative.   Gastrointestinal: Negative.   Endocrine: Negative.   Genitourinary: Negative.   Musculoskeletal: Negative.   Skin: Negative.    Allergic/Immunologic: Negative.   Neurological: Negative.   Hematological: Negative.   Psychiatric/Behavioral: Negative.   All other systems reviewed and are negative.  Past Medical History:  Diagnosis Date  . Adenocarcinoma of rectum ([place])   . Anxiety   . BENIGN PROSTATIC HYPERTROPHY 04/01/2008  . Coronary artery disease   . GERD (gastroesophageal reflux disease)   . HYPERLIPIDEMIA 05/26/2007  . HYPERTENSION 02/07/2007  . Syncope and collapse   . TOBACCO ABUSE 04/01/2008    Social History   Socioeconomic History  . Marital status: Married    Spouse name: Not on file  . Number of children: Not on file  . Years of education: Not on file  . Highest education level: Not on file  Occupational History  . Not on file  Tobacco Use  . Smoking status: Former Smoker    Packs/day: 0.30    Types: Cigarettes  . Smokeless tobacco: Never Used  Vaping Use  . Vaping Use: Never used  Substance and Sexual Activity  . Alcohol use: No  . Drug use: No  . Sexual activity: Not on file  Other Topics Concern  . Not on file  Social History Narrative  . Not on file   Social Determinants of Health   Financial Resource Strain: Not on file  Food Insecurity: Not on file  Transportation Needs: Not on file  Physical Activity: Not on file  Stress: Not on file  Social Connections: Not on file  Intimate Partner Violence: Not on file    Past Surgical History:  Procedure Laterality Date  . APPENDECTOMY    . BIOPSY  08/03/2020   Procedure: BIOPSY;  Surgeon: Gatha Mayer, MD;  Location: [facility];  Service: Endoscopy;;  . COLONOSCOPY WITH PROPOFOL N/A 08/03/2020   Procedure: COLONOSCOPY WITH PROPOFOL;  Surgeon: Gatha Mayer, MD;  Location: [facility];  Service: Endoscopy;  Laterality: N/A;  . ESOPHAGOGASTRODUODENOSCOPY (EGD) WITH PROPOFOL N/A 08/03/2020   Procedure: ESOPHAGOGASTRODUODENOSCOPY (EGD) WITH PROPOFOL;  Surgeon: Gatha Mayer, MD;  Location: [facility];  Service: Endoscopy;   Laterality: N/A;  . TONSILLECTOMY      Family History  Problem Relation Age of Onset  . Arthritis Neg Hx        family  . Hypertension Neg Hx        family  . Stroke Neg Hx        family , 1st degree relative    No Known Allergies  Current Outpatient Medications on File Prior to Visit  Medication Sig Dispense Refill  . acetaminophen (TYLENOL) 650 MG CR tablet Take 650 mg by mouth daily as needed for pain.    Marland Kitchen allopurinol (ZYLOPRIM) 100 MG tablet TAKE 2 TABLETS BY MOUTH EVERY DAY 180 tablet 0  . amLODipine (NORVASC) 10 MG tablet Take 1 tablet (10 mg total) by mouth daily. 30 tablet 1  . aspirin 81 MG chewable tablet Chew 1 tablet (81 mg total) by mouth daily. 30 tablet 0  . carvedilol (COREG) 3.125 MG tablet Take 1 tablet (3.125 mg total) by mouth 2 (two) times daily with a meal. 60 tablet 1  . hydrALAZINE (APRESOLINE) 50 MG tablet Take 1 tablet (50 mg total) by mouth every 8 (eight) hours. 90 tablet 1  . pantoprazole (PROTONIX) 40 MG tablet Take  1 tablet (40 mg total) by mouth 2 (two) times daily. 60 tablet 1  . polyvinyl alcohol (LIQUIFILM TEARS) 1.4 % ophthalmic solution Place 1 drop into both eyes daily as needed for dry eyes.    . simvastatin (ZOCOR) 40 MG tablet Take 1 tablet (40 mg total) by mouth daily. 30 tablet 1  . tamsulosin (FLOMAX) 0.4 MG CAPS capsule Take 1 capsule (0.4 mg total) by mouth daily. 30 capsule 1  . vitamin B-12 (CYANOCOBALAMIN) 1000 MCG tablet Take 1 tablet (1,000 mcg total) by mouth daily. 30 tablet 1   No current facility-administered medications on file prior to visit.    BP (!) 170/60 (BP Location: Left Arm, Patient Position: Sitting, Cuff Size: Normal)   Pulse 89   Temp 98.2 F (36.8 C) (Oral)   Ht 5\' 11"  (1.803 m)   Wt 176 lb 9.6 oz (80.1 kg)   SpO2 99%   BMI 24.63 kg/m       Objective:   Physical Exam Vitals and nursing note reviewed.  Constitutional:      General: He is not in acute distress.    Appearance: Normal appearance. He  is well-developed and normal weight.  HENT:     Head: Normocephalic and atraumatic.     Right Ear: Tympanic membrane, ear canal and external ear normal. There is no impacted cerumen.     Left Ear: Tympanic membrane, ear canal and external ear normal. There is no impacted cerumen.     Nose: Nose normal. No congestion or rhinorrhea.     Mouth/Throat:     Mouth: Mucous membranes are moist.     Pharynx: Oropharynx is clear. No oropharyngeal exudate or posterior oropharyngeal erythema.  Eyes:     General:        Right eye: No discharge.        Left eye: No discharge.     Extraocular Movements: Extraocular movements intact.     Conjunctiva/sclera: Conjunctivae normal.     Pupils: Pupils are equal, round, and reactive to light.  Neck:     Vascular: No carotid bruit.     Trachea: No tracheal deviation.  Cardiovascular:     Rate and Rhythm: Normal rate and regular rhythm.     Pulses: Normal pulses.     Heart sounds: Normal heart sounds. No murmur heard. No friction rub. No gallop.   Pulmonary:     Effort: Pulmonary effort is normal. No respiratory distress.     Breath sounds: Normal breath sounds. No stridor. No wheezing, rhonchi or rales.  Chest:     Chest wall: No tenderness.  Abdominal:     General: Bowel sounds are normal. There is no distension.     Palpations: Abdomen is soft. There is no mass.     Tenderness: There is no abdominal tenderness. There is no right CVA tenderness, left CVA tenderness, guarding or rebound.     Hernia: No hernia is present.  Musculoskeletal:        General: No swelling, tenderness, deformity or signs of injury. Normal range of motion.     Right lower leg: No edema.     Left lower leg: No edema.  Lymphadenopathy:     Cervical: No cervical adenopathy.  Skin:    General: Skin is warm and dry.     Capillary Refill: Capillary refill takes less than 2 seconds.     Coloration: Skin is not jaundiced or pale.     Findings: No bruising, erythema, lesion or  rash.  Neurological:     General: No focal deficit present.     Mental Status: He is alert and oriented to person, place, and time.     Cranial Nerves: No cranial nerve deficit.     Sensory: No sensory deficit.     Motor: No weakness.     Coordination: Coordination normal.     Gait: Gait abnormal (steady gait with cane).     Deep Tendon Reflexes: Reflexes normal.  Psychiatric:        Mood and Affect: Mood normal.        Behavior: Behavior normal.        Thought Content: Thought content normal.        Judgment: Judgment normal.       Assessment & Plan:  1. Adenocarcinoma of rectum [facility]) -Reviewed hospital notes, discharge instructions, labs, and imaging with the patient.  All questions answered to the best of my ability -Vies follow-up with colorectal surgery as well as with oncology. 2. 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 Iron deficiency anemia due to chronic blood loss - Continue to take oral iron  - labs next week with Oncology   4. HTN (hypertension), malignant -Advised to start monitoring his blood pressure at home.  Needs to take his blood pressure medications prior to office visits.  We will keep an eye on his blood pressure via oncology office notes.  Consider increasing Coreg or hydralazine.  5. AKI (acute kidney injury) ([place]) - Back to baseline on discharge   6. Benign prostatic hyperplasia without lower urinary tract symptoms - Follow up with Urology as directed  Dorothyann Peng, NP

## 2020-08-24 ENCOUNTER — Telehealth: Payer: Self-pay | Admitting: Nurse Practitioner

## 2020-08-24 ENCOUNTER — Encounter: Payer: Self-pay | Admitting: Nurse Practitioner

## 2020-08-24 ENCOUNTER — Inpatient Hospital Stay: Payer: Medicare Other

## 2020-08-24 ENCOUNTER — Other Ambulatory Visit: Payer: Self-pay

## 2020-08-24 ENCOUNTER — Inpatient Hospital Stay (HOSPITAL_BASED_OUTPATIENT_CLINIC_OR_DEPARTMENT_OTHER): Payer: Medicare Other | Admitting: Nurse Practitioner

## 2020-08-24 VITALS — BP 180/69 | HR 75 | Temp 97.7°F | Resp 18 | Ht 71.0 in | Wt 175.8 lb

## 2020-08-24 DIAGNOSIS — N400 Enlarged prostate without lower urinary tract symptoms: Secondary | ICD-10-CM | POA: Diagnosis not present

## 2020-08-24 DIAGNOSIS — N2889 Other specified disorders of kidney and ureter: Secondary | ICD-10-CM | POA: Diagnosis not present

## 2020-08-24 DIAGNOSIS — E785 Hyperlipidemia, unspecified: Secondary | ICD-10-CM | POA: Diagnosis not present

## 2020-08-24 DIAGNOSIS — D509 Iron deficiency anemia, unspecified: Secondary | ICD-10-CM | POA: Diagnosis not present

## 2020-08-24 DIAGNOSIS — C20 Malignant neoplasm of rectum: Secondary | ICD-10-CM | POA: Diagnosis present

## 2020-08-24 DIAGNOSIS — R918 Other nonspecific abnormal finding of lung field: Secondary | ICD-10-CM | POA: Diagnosis not present

## 2020-08-24 DIAGNOSIS — Z87891 Personal history of nicotine dependence: Secondary | ICD-10-CM | POA: Diagnosis not present

## 2020-08-24 DIAGNOSIS — I6310 Cerebral infarction due to embolism of unspecified precerebral artery: Secondary | ICD-10-CM

## 2020-08-24 DIAGNOSIS — Z8673 Personal history of transient ischemic attack (TIA), and cerebral infarction without residual deficits: Secondary | ICD-10-CM | POA: Diagnosis not present

## 2020-08-24 DIAGNOSIS — I2510 Atherosclerotic heart disease of native coronary artery without angina pectoris: Secondary | ICD-10-CM | POA: Diagnosis not present

## 2020-08-24 DIAGNOSIS — I10 Essential (primary) hypertension: Secondary | ICD-10-CM | POA: Diagnosis not present

## 2020-08-24 LAB — CBC WITH DIFFERENTIAL (CANCER CENTER ONLY)
Abs Immature Granulocytes: 0.02 10*3/uL (ref 0.00–0.07)
Basophils Absolute: 0 10*3/uL (ref 0.0–0.1)
Basophils Relative: 1 %
Eosinophils Absolute: 0.2 10*3/uL (ref 0.0–0.5)
Eosinophils Relative: 2 %
HCT: 29.7 % — ABNORMAL LOW (ref 39.0–52.0)
Hemoglobin: 9 g/dL — ABNORMAL LOW (ref 13.0–17.0)
Immature Granulocytes: 0 %
Lymphocytes Relative: 11 %
Lymphs Abs: 0.7 10*3/uL (ref 0.7–4.0)
MCH: 23.4 pg — ABNORMAL LOW (ref 26.0–34.0)
MCHC: 30.3 g/dL (ref 30.0–36.0)
MCV: 77.1 fL — ABNORMAL LOW (ref 80.0–100.0)
Monocytes Absolute: 0.5 10*3/uL (ref 0.1–1.0)
Monocytes Relative: 7 %
Neutro Abs: 5 10*3/uL (ref 1.7–7.7)
Neutrophils Relative %: 79 %
Platelet Count: 207 10*3/uL (ref 150–400)
RBC: 3.85 MIL/uL — ABNORMAL LOW (ref 4.22–5.81)
RDW: 22.6 % — ABNORMAL HIGH (ref 11.5–15.5)
WBC Count: 6.3 10*3/uL (ref 4.0–10.5)
nRBC: 0 % (ref 0.0–0.2)

## 2020-08-24 LAB — CMP (CANCER CENTER ONLY)
ALT: 11 U/L (ref 0–44)
AST: 29 U/L (ref 15–41)
Albumin: 4.2 g/dL (ref 3.5–5.0)
Alkaline Phosphatase: 82 U/L (ref 38–126)
Anion gap: 11 (ref 5–15)
BUN: 30 mg/dL — ABNORMAL HIGH (ref 8–23)
CO2: 23 mmol/L (ref 22–32)
Calcium: 9.3 mg/dL (ref 8.9–10.3)
Chloride: 109 mmol/L (ref 98–111)
Creatinine: 2.13 mg/dL — ABNORMAL HIGH (ref 0.61–1.24)
GFR, Estimated: 29 mL/min — ABNORMAL LOW (ref >60)
Glucose, Bld: 112 mg/dL — ABNORMAL HIGH (ref 70–99)
Potassium: 4.7 mmol/L (ref 3.5–5.1)
Sodium: 143 mmol/L (ref 135–145)
Total Bilirubin: 0.4 mg/dL (ref 0.3–1.2)
Total Protein: 8.1 g/dL (ref 6.5–8.1)

## 2020-08-24 LAB — CEA (IN HOUSE-CHCC): CEA (CHCC-In House): 4.07 ng/mL (ref 0.00–5.00)

## 2020-08-24 NOTE — Progress Notes (Signed)
The proposed treatment discussed in conference is for discussion purposes only and is not a binding recommendation.  The patients have not been physically examined, or presented with their treatment options.  Therefore, final treatment plans cannot be decided.   

## 2020-08-24 NOTE — Progress Notes (Addendum)
[name]   Diagnosis: Rectal cancer  INTERVAL HISTORY:   Isaac Phelps returns as scheduled.  In general he feels well.  No nausea or vomiting.  Bowels moving regularly.  He is not aware of any blood with bowel movements.  No pain with bowel movements.  He continues to have intermittent pain at the left upper abdomen.  He reports he quit smoking in January after smoking for significant number of years.  Objective:  Vital signs in last 24 hours:  Blood pressure (!) 180/69, pulse 75, temperature 97.7 F (36.5 C), temperature source Tympanic, resp. rate 18, height 5\' 11"  (1.803 m), weight 175 lb 12.8 oz (79.7 kg), SpO2 99 %.    HEENT: No thrush or ulcers. Resp: Lungs clear bilaterally. Cardio: Regular rate and rhythm. GI: Abdomen soft and nontender.  No hepatomegaly. Vascular: No leg edema.    Lab Results:  Lab Results  Component Value Date   WBC 6.0 08/03/2020   HGB 8.8 (L) 08/04/2020   HCT 30.0 (L) 08/04/2020   MCV 74.2 (L) 08/03/2020   PLT 205 08/03/2020   NEUTROABS 4.6 07/29/2020    Imaging:  No results found.  Medications: I have reviewed the patient's current medications.  Assessment/Plan: 1. Rectal cancer ? Colonoscopy 08/03/2020-distal rectal mass abutting the anus, biopsy confirmed adenocarcinoma ? CT angiography 07/29/2020-hazy soft tissue at the porta hepatis encasing the right hepatic artery, exophytic soft tissue lesion at the upper pole of the left kidney-indeterminate, partially solid nodule in the left lower lobe-present on a CT in 2013 suspicious, suspicious for a low-grade pulmonary neoplasm, enlarged prostate, ? MRI abdomen 07/30/2020-no evidence of mass in the porta hepatis, hypoenhancing lesions in the upper pole of the left kidney and interpolar region of the right kidney suspicious for small neoplasms, hemosiderosis of the liver and spleen ? MRI pelvis 08/17/2020-T2, N0; minimal if any distance from tumor to internal  anal sphincter 2. Enlarged prostate with an elevated PSA 3. 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 Hypertension 5. Gout 6. History of coronary artery disease 7. Iron deficiency anemia-Feraheme 07/30/2020 8. B12 deficiency-07/31/2020 B12 161, on oral B12   Disposition: Isaac Phelps appears stable.  Dr. Benay Spice reviewed the MRI pelvis results with him and his daughter at today's visit.  He appears to have early stage rectal cancer with minimal distance from tumor to the internal anal sphincter.  His case was presented at the GI tumor conference.  The consensus was that he would require an APR/permanent colostomy.  His initial thought is that he would not want to undergo surgery.  He is agreeable to meeting with the surgeon as scheduled on 08/31/2020.  Dr. Benay Spice discussed radiation/Xeloda chemotherapy.  We reviewed potential toxicities associated with Xeloda including bone marrow toxicity, nausea, mouth sores, diarrhea, skin hyperpigmentation, skin rash, hand-foot syndrome.  He agrees to proceed.  Referral made to radiation oncology.  He will return to the lab today for a CBC, chemistry panel and CEA.  We will plan to see him back the week of 09/26/2020.  Patient seen with Dr. Benay Spice.    Ned Card ANP/GNP-BC   08/24/2020  9:28 AM This was a shared visit with Ned Card.  Isaac Phelps has been diagnosed with rectal cancer.  He appears to have early stage disease based on the clinical staging to date.  The tumor is in the distal rectum and will require an APR procedure for resection.  His case was presented at the  GI tumor conference this morning.  Isaac Phelps indicates he does not wish to consider surgery that would require a colostomy.  He agrees to discuss this further with a Psychologist, sport and exercise.  We discussed the role for concurrent capecitabine and radiation.  This may palliate his symptoms, prevent bowel obstruction/pain, and a small number of patients have a complete clinical  response with chemotherapy and radiation.  We reviewed potential toxicities associated with capecitabine.  He agrees to proceed.  His daughter was present for today's visit.  A capecitabine prescription was entered.   I was present for greater than 50% of today's visit.  I performed medical decision making.   Julieanne Manson, MD

## 2020-08-24 NOTE — Telephone Encounter (Signed)
Called pt epr 3/23 los - unable to reach pt and unable to leave message. Mailed letter with appt date and time

## 2020-08-24 NOTE — Progress Notes (Signed)
Met with patient and his daughter [name] today at follow up with Ned Card NP after additional scans were obtained.  He expressed feeling a little overwhelmed with the treatment plan.  He is scheduled to see Dr. Dema Severin on 08/31/2020.  I reiterated for him to call me on my direct phone number should he have any questions or concerns and he stated he would.

## 2020-08-25 ENCOUNTER — Telehealth: Payer: Self-pay | Admitting: Pharmacist

## 2020-08-25 ENCOUNTER — Other Ambulatory Visit: Payer: Self-pay | Admitting: Oncology

## 2020-08-25 ENCOUNTER — Telehealth: Payer: Self-pay

## 2020-08-25 DIAGNOSIS — C20 Malignant neoplasm of rectum: Secondary | ICD-10-CM

## 2020-08-25 MED ORDER — CAPECITABINE 500 MG PO TABS: ORAL_TABLET | ORAL | 0 refills | Status: DC

## 2020-08-25 NOTE — Telephone Encounter (Signed)
Oral Oncology Patient Advocate Encounter  After completing a benefits investigation, prior authorization for Xeloda is not required at this time through Medicare B.  Patient's copay is $21.66  [facility] Phone [phone] Fax [phone] 08/25/2020 3:03 PM

## 2020-08-26 ENCOUNTER — Other Ambulatory Visit: Payer: Self-pay | Admitting: Oncology

## 2020-08-26 MED ORDER — CAPECITABINE 500 MG PO TABS: ORAL_TABLET | ORAL | 0 refills | Status: DC

## 2020-08-26 NOTE — Telephone Encounter (Signed)
Oral Oncology Pharmacist Encounter  Received new prescription for Xeloda (capecitabine) for the treatment of rectal cancer in conjunction with radiation, planned for duration of radiation.  Prescription dose and frequency assessed for appropriateness. Discussed with Dr. Benay Spice plan to proceed with Xeloda at this time at dose reduction given patient's variable renal function.   CBC w/ Diff and CMP from 08/24/20 assessed, pt Scr 2.13 mg/dL (CrCl ~27 mL/min), previous CrCl on 08/03/20 ~32 mL/min. Discussed with Dr. Benay Spice - plan to proceed with Xeloda at dose reduction at this time.   Current medication list in Epic reviewed, DDIs with Xeloda identified:  Category X DDI between Xeloda and allopurinol - concomitant use not recommended due to decreased serum concentrations of Xeloda. Will confirm patient is no longer taking allopurinol and update medication list Category C DDI between Xeloda and Pantoprazole - proton-pump inhibitors can decrease efficacy of Xeloda - will discuss with patient alternatives to pantoprazole, such as H2RA's like famotidine while on Xeloda.  Evaluated chart and no patient barriers to medication adherence noted.   Patient agreement for Xeloda treatment documented in MD note on 08/24/20.  Prescription has been e-scribed to the [facility] for benefits analysis and approval.  Oral Oncology Clinic will continue to follow for insurance authorization, copayment issues, initial counseling and start date.  Leron Croak, PharmD, BCPS Hematology/Oncology Clinical Pharmacist [facility] [phone] 08/26/2020 7:49 AM

## 2020-09-02 NOTE — Progress Notes (Signed)
GI Location of Tumor / Histology: Rectal Cancer  KNOWLEDGE ESCANDON presented   MRI Pelvis 08/16/2020: Rectal adenocarcinoma T stage: T2  Rectal adenocarcinoma N stage:  N0  Minimal if any distance from tumor to internal anal sphincter.  Colonoscopy 08/03/2020: Distal rectal mass abutting the anus.  Biopsies of Rectal Mass 08/03/2020   Past/Anticipated interventions by surgeon, if any:  08/31/2020   Past/Anticipated interventions by medical oncology, if any:  Dr. Luberta Robertson NP 08/24/2020 -He appears to have a early stage rectal cancer with minimal distance from tumor to the internal anal sphincter. -His case was presented at GI tumor conference, he consensus was that he would require an APR/permanent colostomy. -His initial thought is that he would not want to undergo surgery. -He is agreeable to meeting with the surgeon. -We discussed radiation/xeloda chemotherapy. -Referral made to radiation oncology.   Weight changes, if any: Lost about 6 pounds over the past month.  Bowel/Bladder complaints, if any: No constipation or diarrhea noted.  No changes to his bladder habits.  Nausea / Vomiting, if any: No  Pain issues, if any:  No  Any blood per rectum: No blood noted recently in stool.   SAFETY ISSUES:  Prior radiation? No  Pacemaker/ICD? No  Possible current pregnancy? n/a  Is the patient on methotrexate? No  Current Complaints/Details:

## 2020-09-06 ENCOUNTER — Ambulatory Visit
Admission: RE | Admit: 2020-09-06 | Discharge: 2020-09-06 | Disposition: A | Discharge status: Home / Self Care | Payer: Medicare Other | Source: Ambulatory Visit | Attending: Radiation Oncology | Admitting: Radiation Oncology

## 2020-09-06 ENCOUNTER — Other Ambulatory Visit: Payer: Self-pay

## 2020-09-06 ENCOUNTER — Encounter: Payer: Self-pay | Admitting: Radiation Oncology

## 2020-09-06 VITALS — BP 161/69 | HR 70 | Temp 98.1°F | Resp 20 | Ht 72.0 in | Wt 173.8 lb

## 2020-09-06 DIAGNOSIS — C20 Malignant neoplasm of rectum: Secondary | ICD-10-CM | POA: Diagnosis present

## 2020-09-06 DIAGNOSIS — K4090 Unilateral inguinal hernia, without obstruction or gangrene, not specified as recurrent: Secondary | ICD-10-CM | POA: Diagnosis not present

## 2020-09-06 DIAGNOSIS — I2510 Atherosclerotic heart disease of native coronary artery without angina pectoris: Secondary | ICD-10-CM | POA: Diagnosis not present

## 2020-09-06 DIAGNOSIS — I10 Essential (primary) hypertension: Secondary | ICD-10-CM | POA: Insufficient documentation

## 2020-09-06 DIAGNOSIS — K219 Gastro-esophageal reflux disease without esophagitis: Secondary | ICD-10-CM | POA: Insufficient documentation

## 2020-09-06 DIAGNOSIS — E785 Hyperlipidemia, unspecified: Secondary | ICD-10-CM | POA: Insufficient documentation

## 2020-09-06 DIAGNOSIS — I6389 Other cerebral infarction: Secondary | ICD-10-CM | POA: Diagnosis not present

## 2020-09-06 DIAGNOSIS — Z87891 Personal history of nicotine dependence: Secondary | ICD-10-CM | POA: Insufficient documentation

## 2020-09-06 DIAGNOSIS — Z79899 Other long term (current) drug therapy: Secondary | ICD-10-CM | POA: Diagnosis not present

## 2020-09-06 DIAGNOSIS — N400 Enlarged prostate without lower urinary tract symptoms: Secondary | ICD-10-CM | POA: Insufficient documentation

## 2020-09-06 DIAGNOSIS — M47817 Spondylosis without myelopathy or radiculopathy, lumbosacral region: Secondary | ICD-10-CM | POA: Diagnosis not present

## 2020-09-06 NOTE — Progress Notes (Signed)
Radiation Oncology         [phone] ________________________________  Name: Isaac Phelps        MRN: [id number]  Date of Service: 09/06/2020 DOB: 1930/07/12  GY:IRSWNIOE, Isaac Rumps, NP  Isaac Pier, MD     REFERRING PHYSICIAN: Ladell Pier, MD   DIAGNOSIS: The encounter diagnosis was Adenocarcinoma of rectum [facility]).   HISTORY OF PRESENT ILLNESS: Isaac Phelps is a 87 y.o. male seen at the request of Dr. Benay Spice for a diagnosis of rectal cancer.  The patient presented with symptoms of chest pain and was admitted to the hospital in February 2022, he was diagnosed with small acute left frontal occipital and right periatrial white matter infarcts after complaining of left facial numbness and started on aspirin therapy.  He was found to have an microcytic anemia and he underwent colonoscopy with Dr. Carlean Purl on 08/03/2020.  A mass was noted on rectal exam and endoscopically a mass was seen measuring 3 cm in length, and biopsies were consistent with adenocarcinoma with signet ring features.  CT of the chest abdomen and pelvis revealed cardiac enlargement, fluid attending UA shin in the posterior mediastinum was noted but stable from 2013, a partial nodule in the superior segment of the left lower lobe measuring 2.2 cm was seen previously 1.8 cm, a small liver cyst and multiple gallstones were seen as well as a subtle exophytic soft tissue lesion in the left kidney mild hazy soft tissue did abnormalities were seen in the porta hepatis and enlargement of the prostate gland, MRI of the abdomen did not show any suspicious liver lesion, and subsequent MRI of the pelvis on 08/16/2020 revealed a 3.4 cm lesion 5.3 cm from the anal verge and was categorized at T2, no mesorectal adenopathy was identified so it was classified N0.  His case was discussed in multidisciplinary GI conference and he would be a candidate for APR, however has decided to forgo surgery given his age.  He is seen today to discuss the  options of chemoradiation.  He did meet back with Dr. Benay Spice to discuss this, and a prescription for Xeloda was given to him.     PREVIOUS RADIATION THERAPY: No   PAST MEDICAL HISTORY:  Past Medical History:  Diagnosis Date  . Adenocarcinoma of rectum ([place])   . Anxiety   . BENIGN PROSTATIC HYPERTROPHY 04/01/2008  . Coronary artery disease   . GERD (gastroesophageal reflux disease)   . HYPERLIPIDEMIA 05/26/2007  . HYPERTENSION 02/07/2007  . Syncope and collapse   . TOBACCO ABUSE 04/01/2008       PAST SURGICAL HISTORY: Past Surgical History:  Procedure Laterality Date  . APPENDECTOMY    . BIOPSY  08/03/2020   Procedure: BIOPSY;  Surgeon: Isaac Mayer, MD;  Location: [facility];  Service: Endoscopy;;  . COLONOSCOPY WITH PROPOFOL N/A 08/03/2020   Procedure: COLONOSCOPY WITH PROPOFOL;  Surgeon: Isaac Mayer, MD;  Location: [facility];  Service: Endoscopy;  Laterality: N/A;  . ESOPHAGOGASTRODUODENOSCOPY (EGD) WITH PROPOFOL N/A 08/03/2020   Procedure: ESOPHAGOGASTRODUODENOSCOPY (EGD) WITH PROPOFOL;  Surgeon: Isaac Mayer, MD;  Location: [facility];  Service: Endoscopy;  Laterality: N/A;  . TONSILLECTOMY       FAMILY HISTORY:  Family History  Problem Relation Age of Onset  . Arthritis Neg Hx        family  . Hypertension Neg Hx        family  . Stroke Neg Hx  family , 1st degree relative     SOCIAL HISTORY:  reports that he has quit smoking. His smoking use included cigarettes. He smoked 0.30 packs per day. He has never used smokeless tobacco. He reports that he does not drink alcohol and does not use drugs.  The patient is married but his wife is in a nursing home. He lives independently and enjoys watching TV. 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 His daughter Isaac Phelps accompanies him today.   ALLERGIES: Patient has no known allergies.   MEDICATIONS:  Current Outpatient Medications  Medication Sig Dispense  Refill  . acetaminophen (TYLENOL) 650 MG CR tablet Take 650 mg by mouth daily as needed for pain.    Marland Kitchen allopurinol (ZYLOPRIM) 100 MG tablet TAKE 2 TABLETS BY MOUTH EVERY DAY 180 tablet 0  . amLODipine (NORVASC) 10 MG tablet Take 1 tablet (10 mg total) by mouth daily. 30 tablet 1  . capecitabine (XELODA) 500 MG tablet TAKE 3 TABS (1500 MG) BY MOUTH IN AM, 2 TABS (1000 MG) IN PM, (TOTAL DOSE 2500 MG DAILY). TAKE WITHIN 30 MINUTES AFTER MEALS. TAKE ON DAYS OF RADIATION ONLY 140 tablet 0  . carvedilol (COREG) 3.125 MG tablet Take 1 tablet (3.125 mg total) by mouth 2 (two) times daily with a meal. 60 tablet 1  . hydrALAZINE (APRESOLINE) 50 MG tablet Take 1 tablet (50 mg total) by mouth every 8 (eight) hours. 90 tablet 1  . pantoprazole (PROTONIX) 40 MG tablet Take 1 tablet (40 mg total) by mouth 2 (two) times daily. 60 tablet 1  . polyvinyl alcohol (LIQUIFILM TEARS) 1.4 % ophthalmic solution Place 1 drop into both eyes daily as needed for dry eyes.    . simvastatin (ZOCOR) 40 MG tablet Take 1 tablet (40 mg total) by mouth daily. 30 tablet 1  . tamsulosin (FLOMAX) 0.4 MG CAPS capsule Take 1 capsule (0.4 mg total) by mouth daily. 30 capsule 1  . vitamin B-12 (CYANOCOBALAMIN) 1000 MCG tablet Take 1 tablet (1,000 mcg total) by mouth daily. 30 tablet 1   No current facility-administered medications for this encounter.     REVIEW OF SYSTEMS: On review of systems, the patient reports that he is doing pretty well at this time. He denies any abdominal pain, nausea, vomiting, constipation or diarrhea. He states he saw blood in his stool one time since his colonoscopy. Otherwise he feels quite good and is able to eat whatever he likes. He describes occasional pain in his left side but denies any pain now. No other complaints are verbalized.    PHYSICAL EXAM:  Wt Readings from Last 3 Encounters:  09/06/20 173 lb 12.8 oz (78.8 kg)  08/24/20 175 lb 12.8 oz (79.7 kg)  08/18/20 176 lb 9.6 oz (80.1 kg)   Temp  Readings from Last 3 Encounters:  09/06/20 98.1 F (36.7 C)  08/24/20 97.7 F (36.5 C) (Tympanic)  08/18/20 98.2 F (36.8 C) (Oral)   BP Readings from Last 3 Encounters:  09/06/20 (!) 161/69  08/24/20 (!) 180/69  08/18/20 (!) 170/60   Pulse Readings from Last 3 Encounters:  09/06/20 70  08/24/20 75  08/18/20 89   Pain Assessment Pain Score: 3  Pain Loc:  (Has pain in left side on occasion.)/10  In general this is a well appearing African American malein no acute distress. He's alert and oriented x4 and appropriate throughout the examination. Cardiopulmonary assessment is negative for acute distress and  he exhibits normal effort.    ECOG = 1  0 - Asymptomatic (Fully active, able to carry on all predisease activities without restriction)  1 - Symptomatic but completely ambulatory (Restricted in physically strenuous activity but ambulatory and able to carry out work of a light or sedentary nature. For example, light housework, office work)  2 - Symptomatic, <50% in bed during the day (Ambulatory and capable of all self care but unable to carry out any work activities. Up and about more than 50% of waking hours)  3 - Symptomatic, >50% in bed, but not bedbound (Capable of only limited self-care, confined to bed or chair 50% or more of waking hours)  4 - Bedbound (Completely disabled. Cannot carry on any self-care. Totally confined to bed or chair)  5 - Death   Eustace Pen MM, Creech RH, Tormey DC, et al. [phone]). "Toxicity and response criteria of the [facility]". [place] Oncol. 5 (6): 649-55    LABORATORY DATA:  Lab Results  Component Value Date   WBC 6.3 08/24/2020   HGB 9.0 (L) 08/24/2020   HCT 29.7 (L) 08/24/2020   MCV 77.1 (L) 08/24/2020   PLT 207 08/24/2020   Lab Results  Component Value Date   NA 143 08/24/2020   K 4.7 08/24/2020   CL 109 08/24/2020   CO2 23 08/24/2020   Lab Results  Component Value Date   ALT 11 08/24/2020   AST 29  08/24/2020   ALKPHOS 82 08/24/2020   BILITOT 0.4 08/24/2020      RADIOGRAPHY: MR PELVIS W WO CONTRAST  Result Date: 08/17/2020 CLINICAL DATA:  Staging of rectal cancer. EXAM: MRI PELVIS WITHOUT AND WITH CONTRAST TECHNIQUE: Multiplanar multisequence MR imaging of the pelvis was performed both before and after administration of intravenous contrast. Small amount of [place] gel was administered per rectum to optimize tumor evaluation. CONTRAST:  19m GADAVIST GADOBUTROL 1 MMOL/ML IV SOLN COMPARISON:  Pelvic CT of 2252. Colonoscopy report 08/03/2020. This describes a distal anorectal mass FINDINGS: TUMOR LOCATION Tumor distance from Anal Verge/Skin Surface: 5.3 cm on 18/2. Tumor distance from Internal Anal Sphincter: Minimal if any distance between the inferior aspect of the mass in the internal sphincters. Example on 23/4. TUMOR DESCRIPTION Circumferential Extent: Centered about the 10 o'clock position, less than 180 degrees including on 21/3 and 24/3. Also 26/9 and 22-28/12. Tumor Length: 3.4 cm including on 18/2. T - CATEGORY Extension through Muscularis Propria: No Shortest Distance of any tumor/node from Mesorectal Fascia: Not applicable (low rectal/anorectal junction) Extramural Vascular Invasion/Tumor Thrombus: No Invasion of Anterior Peritoneal Reflection: No Involvement of Adjacent Organs or Pelvic Sidewall: No Levator Ani Involvement: Tumor is intimately associated with the sphincters. N - CATEGORY Mesorectal Lymph Nodes >=566m None Extra-mesorectal Lymphadenopathy: No Other: Left sided inguinal hernia containing sigmoid colon. A right-sided inguinal hernia contains a portion of the cecum. Prostatomegaly with median lobe impression into the urinary bladder. No significant free fluid. Lumbosacral spondylosis. IMPRESSION: Rectal adenocarcinoma T stage: T2 Rectal adenocarcinoma N stage:  N0 Minimal if any distance from tumor to internal anal sphincter. Electronically Signed   By: KyAbigail Miyamoto.D.   On:  08/17/2020 09:04       IMPRESSION/PLAN: 1. Stage I, cT2N0M0 adenocarcinoma of the rectum. Dr. MoLisbeth Renshawiscusses the pathology findings and reviews the nature of rectal carcinoma. The paitent has met with Dr. ShBenay Spicend had originally agreed to consultation in colorectal surgery but decided to forgo that visit as he would decline APR  due to his age and comorbidities. Rather he's interested in chemoRT. Dr. Lisbeth Renshaw discusses the role of external radiotherapy to the rectum to reduce risks of local recurrence and treat his known disease. The patient is aware that curative dosing would be used, but that without surgery, it is likely his cancer will return. We discussed the risks, benefits, short, and long term effects of radiotherapy, as well as the intent, and the patient is interested in proceeding. Dr. Lisbeth Renshaw discusses the delivery and logistics of radiotherapy and anticipates a course of 5 1/2 weeks of radiotherapy to the rectum and pelvis. Written consent is obtained and placed in the chart, a copy was provided to the patient. He will simulate tomorrow at 10:30 am in order to proceed with treatment on 09/12/20.  In a visit lasting 60 minutes, greater than 50% of the time was spent face to face with Dr. Lisbeth Renshaw and via Jackquline Denmark with me discussing the patient's condition, in preparation for the discussion, and coordinating the patient's care.    The above documentation reflects my direct findings during this shared patient visit. Please see the separate note by Dr. Lisbeth Renshaw on this date for the remainder of the patient's plan of care.    Carola Rhine, [facility]   **Disclaimer: This note was dictated with voice recognition software. Similar sounding words can inadvertently be transcribed and this note may contain transcription errors which may not have been corrected upon publication of note.**

## 2020-09-07 ENCOUNTER — Other Ambulatory Visit: Payer: Self-pay

## 2020-09-07 ENCOUNTER — Ambulatory Visit
Admission: RE | Admit: 2020-09-07 | Discharge: 2020-09-07 | Disposition: A | Discharge status: Home / Self Care | Payer: Medicare Other | Source: Ambulatory Visit | Attending: Radiation Oncology | Admitting: Radiation Oncology

## 2020-09-07 ENCOUNTER — Other Ambulatory Visit (HOSPITAL_COMMUNITY): Payer: Self-pay

## 2020-09-07 DIAGNOSIS — C20 Malignant neoplasm of rectum: Secondary | ICD-10-CM | POA: Diagnosis present

## 2020-09-07 DIAGNOSIS — I2510 Atherosclerotic heart disease of native coronary artery without angina pectoris: Secondary | ICD-10-CM | POA: Insufficient documentation

## 2020-09-07 DIAGNOSIS — Z510 Encounter for antineoplastic radiation therapy: Secondary | ICD-10-CM | POA: Diagnosis present

## 2020-09-07 MED FILL — Capecitabine Tab 500 MG: ORAL | 28 days supply | Qty: 140 | Fill #0 | Status: CN

## 2020-09-07 NOTE — Telephone Encounter (Signed)
Oral Chemotherapy Pharmacist Encounter  I spoke with patient's daughter, Rachit Grim, for overview of: Xeloda for the treatment of rectal cancer in conjunction with radiation, planned duration 5 1/2 - 6 weeks.   Counseled on administration, dosing, side effects, monitoring, drug-food interactions, safe handling, storage, and disposal.  Patient will take Xeloda 500mg  tablets, 3 tablets (1500mg ) by mouth in AM and 2 tabs (1000mg ) by mouth in PM, within 30 minutes of finishing meals, on days of radiation only.  Xeloda and radiation start date: 09/12/20  Adverse effects of Xeloda include but are not limited to: fatigue, decreased blood counts, GI upset, diarrhea, mouth sores, and hand-foot syndrome.  Patient will obtain anti diarrheal and alert the office of 4 or more loose stools above baseline.   Reviewed importance of keeping a medication schedule and plan for any missed doses. No barriers to medication adherence identified.  Medication reconciliation performed and medication/allergy list updated. Daughter confirmed that patient is no longer taking pantoprazole - medication removed from medication list. She stated that Mr. Njoku has not been compliant with his allopurinol for gout, instructed daughter that this medication should be held while he is on Xeloda as it can increase risk of side effects from Xeloda. Daughter verbalized understanding.   Insurance authorization for Xeloda has been obtained. Test claim at the pharmacy revealed copayment $21.66 for 1st fill of Xeloda. This will ship from the [facility] on 09/07/20 to deliver to patient's home on 09/08/20.  Informed daughter that the pharmacy will reach out 5-7 days prior to needing next fill of Xeloda to coordinate continued medication acquisition to prevent break in therapy.  All questions answered.  Ms. Luber voiced understanding and appreciation.   Medication education handout and medication calendar placed in  mail for patient. Patient and family know to call the office with questions or concerns. Oral Chemotherapy Clinic phone number provided.   Leron Croak, PharmD, BCPS Hematology/Oncology Clinical Pharmacist [facility] [phone] 09/07/2020 2:28 PM

## 2020-09-08 ENCOUNTER — Other Ambulatory Visit (HOSPITAL_COMMUNITY): Payer: Self-pay

## 2020-09-09 ENCOUNTER — Other Ambulatory Visit (HOSPITAL_COMMUNITY): Payer: Self-pay

## 2020-09-09 DIAGNOSIS — Z510 Encounter for antineoplastic radiation therapy: Secondary | ICD-10-CM | POA: Diagnosis not present

## 2020-09-12 ENCOUNTER — Ambulatory Visit
Admission: RE | Admit: 2020-09-12 | Discharge: 2020-09-12 | Disposition: A | Discharge status: Home / Self Care | Payer: Medicare Other | Source: Ambulatory Visit | Attending: Radiation Oncology | Admitting: Radiation Oncology

## 2020-09-12 ENCOUNTER — Other Ambulatory Visit: Payer: Self-pay

## 2020-09-12 DIAGNOSIS — Z510 Encounter for antineoplastic radiation therapy: Secondary | ICD-10-CM | POA: Diagnosis not present

## 2020-09-13 ENCOUNTER — Ambulatory Visit
Admission: RE | Admit: 2020-09-13 | Discharge: 2020-09-13 | Disposition: A | Discharge status: Home / Self Care | Payer: Medicare Other | Source: Ambulatory Visit | Attending: Radiation Oncology | Admitting: Radiation Oncology

## 2020-09-13 DIAGNOSIS — Z510 Encounter for antineoplastic radiation therapy: Secondary | ICD-10-CM | POA: Diagnosis not present

## 2020-09-14 ENCOUNTER — Other Ambulatory Visit: Payer: Self-pay

## 2020-09-14 ENCOUNTER — Ambulatory Visit
Admission: RE | Admit: 2020-09-14 | Discharge: 2020-09-14 | Disposition: A | Discharge status: Home / Self Care | Payer: Medicare Other | Source: Ambulatory Visit | Attending: Radiation Oncology | Admitting: Radiation Oncology

## 2020-09-14 DIAGNOSIS — Z510 Encounter for antineoplastic radiation therapy: Secondary | ICD-10-CM | POA: Diagnosis not present

## 2020-09-15 ENCOUNTER — Ambulatory Visit
Admission: RE | Admit: 2020-09-15 | Discharge: 2020-09-15 | Disposition: A | Discharge status: Home / Self Care | Payer: Medicare Other | Source: Ambulatory Visit | Attending: Radiation Oncology | Admitting: Radiation Oncology

## 2020-09-15 ENCOUNTER — Other Ambulatory Visit (HOSPITAL_COMMUNITY): Payer: Self-pay

## 2020-09-15 DIAGNOSIS — Z510 Encounter for antineoplastic radiation therapy: Secondary | ICD-10-CM | POA: Diagnosis not present

## 2020-09-15 MED FILL — Capecitabine Tab 500 MG: ORAL | 28 days supply | Qty: 140 | Fill #0 | Status: CN

## 2020-09-16 ENCOUNTER — Telehealth: Payer: Self-pay | Admitting: *Deleted

## 2020-09-16 ENCOUNTER — Other Ambulatory Visit: Payer: Self-pay

## 2020-09-16 ENCOUNTER — Ambulatory Visit
Admission: RE | Admit: 2020-09-16 | Discharge: 2020-09-16 | Disposition: A | Discharge status: Home / Self Care | Payer: Medicare Other | Source: Ambulatory Visit | Attending: Radiation Oncology | Admitting: Radiation Oncology

## 2020-09-16 DIAGNOSIS — Z510 Encounter for antineoplastic radiation therapy: Secondary | ICD-10-CM | POA: Diagnosis not present

## 2020-09-16 NOTE — Progress Notes (Signed)
Pt here for patient teaching.  Pt given Radiation and You booklet and skin care instructions.  Reviewed areas of pertinence such as diarrhea, fatigue, hair loss, nausea and vomiting, skin changes and urinary and bladder changes . Pt able to give teach back of to pat skin, use unscented/gentle soap, use baby wipes, have Imodium on hand, drink plenty of water and sitz bath. Pt demonstrated understanding of information given and will contact nursing with any questions or concerns.     Http://rtanswers.org/treatmentinformation/whattoexpect/index

## 2020-09-16 NOTE — Telephone Encounter (Signed)
CALLED PATIENT'S DAUGHTER TO ASK ABOUT COMING FOR A NUTRITION APPT. ON 09-23-20 @ 9:45 AM , PATIENT'S DAUGHTER WANTS A LATER APPT. DUE TO NOT BE ABLE TO GET OFF WORK, APPT. BOOKED FOR 09-23-20 @ 1:45 PM, PATIENT'S DAUGHTER Isaac Phelps AGREED TO APPT. WITH NUTRITION ON 09-23-20 @ 1:45 PM

## 2020-09-16 NOTE — Progress Notes (Signed)
Patient seen today in the clinic for an under treatment visit with Dr. Tammi Klippel. Per Dr. Tammi Klippel schedule the patient for a nutrition appointment for next week Friday. Appointment was scheduled. Patient's daughter aware.

## 2020-09-19 ENCOUNTER — Other Ambulatory Visit: Payer: Self-pay

## 2020-09-19 ENCOUNTER — Ambulatory Visit
Admission: RE | Admit: 2020-09-19 | Discharge: 2020-09-19 | Disposition: A | Discharge status: Home / Self Care | Payer: Medicare Other | Source: Ambulatory Visit | Attending: Radiation Oncology | Admitting: Radiation Oncology

## 2020-09-19 DIAGNOSIS — Z510 Encounter for antineoplastic radiation therapy: Secondary | ICD-10-CM | POA: Diagnosis not present

## 2020-09-20 ENCOUNTER — Ambulatory Visit
Admission: RE | Admit: 2020-09-20 | Discharge: 2020-09-20 | Disposition: A | Discharge status: Home / Self Care | Payer: Medicare Other | Source: Ambulatory Visit | Attending: Radiation Oncology | Admitting: Radiation Oncology

## 2020-09-20 ENCOUNTER — Other Ambulatory Visit: Payer: Self-pay

## 2020-09-20 ENCOUNTER — Other Ambulatory Visit (HOSPITAL_COMMUNITY): Payer: Self-pay

## 2020-09-20 DIAGNOSIS — Z510 Encounter for antineoplastic radiation therapy: Secondary | ICD-10-CM | POA: Diagnosis not present

## 2020-09-21 ENCOUNTER — Ambulatory Visit
Admission: RE | Admit: 2020-09-21 | Discharge: 2020-09-21 | Disposition: A | Discharge status: Home / Self Care | Payer: Medicare Other | Source: Ambulatory Visit | Attending: Radiation Oncology | Admitting: Radiation Oncology

## 2020-09-21 DIAGNOSIS — Z510 Encounter for antineoplastic radiation therapy: Secondary | ICD-10-CM | POA: Diagnosis not present

## 2020-09-22 ENCOUNTER — Ambulatory Visit
Admission: RE | Admit: 2020-09-22 | Discharge: 2020-09-22 | Disposition: A | Discharge status: Home / Self Care | Payer: Medicare Other | Source: Ambulatory Visit | Attending: Radiation Oncology | Admitting: Radiation Oncology

## 2020-09-22 ENCOUNTER — Other Ambulatory Visit: Payer: Self-pay

## 2020-09-22 DIAGNOSIS — Z510 Encounter for antineoplastic radiation therapy: Secondary | ICD-10-CM | POA: Diagnosis not present

## 2020-09-23 ENCOUNTER — Ambulatory Visit
Admission: RE | Admit: 2020-09-23 | Discharge: 2020-09-23 | Disposition: A | Discharge status: Home / Self Care | Payer: Medicare Other | Source: Ambulatory Visit | Attending: Radiation Oncology | Admitting: Radiation Oncology

## 2020-09-23 ENCOUNTER — Encounter: Payer: Medicare Other | Admitting: Dietician

## 2020-09-23 ENCOUNTER — Inpatient Hospital Stay: Payer: Medicare Other | Admitting: Dietician

## 2020-09-23 DIAGNOSIS — C20 Malignant neoplasm of rectum: Secondary | ICD-10-CM | POA: Insufficient documentation

## 2020-09-23 DIAGNOSIS — I2510 Atherosclerotic heart disease of native coronary artery without angina pectoris: Secondary | ICD-10-CM | POA: Insufficient documentation

## 2020-09-23 DIAGNOSIS — Z510 Encounter for antineoplastic radiation therapy: Secondary | ICD-10-CM | POA: Diagnosis not present

## 2020-09-23 NOTE — Progress Notes (Signed)
Nutrition Assessment   Reason for Assessment:    ASSESSMENT: Provider request  87 year old male with rectal cancer. He is receiving current radiation and chemotherapy. Patient is followed by Dr. Benay Spice and Dr. Lisbeth Renshaw.  Past medical history of HTN, CKD stage III, GERD, BPH, stroke, anemia -2/25-3/3: hospital admission - acute stroke, rectal mass   Met with patient and daughter in clinic. Patient reports varying appetite, eats when he is hungry. Patient is retired from working third shift as a Production designer, theatre/television/film at [employer]. He reports staying up through the night, "takes cat naps, but does not sleep" Patient eats most meals out, snacks on leftovers late in the evening.   He has ribs, potato salad, pork chops, collards, corn, and cornbread in his refrigerator currently. He likes to eat eggs, bacon, sausage, toast for breakfast, snacks on pork rinds, potato chips, oatmeal cookies, chocolate chip cookies and eats leftovers while watching television in bed, usually after 7pm. Patient reports drinking a small amount of water and has started drinking Ensure in the last couple of days.    Medications: B12   Labs: 3/23 Hgb 9, Glucose 112, BUN 30, Cr 2.13   Anthropometrics: Weights have decreased 4 lbs (2%) in the last 3 weeks; concerning  Height: 6' Weight: 173 lb 12.8 oz UBW: 180 lb (per pt) BMI: 23.57    NUTRITION DIAGNOSIS: Food and nutrition knowledge deficit related to newly diagnosed rectal cancer as evidenced by no prior need for low residue diet education   INTERVENTION:  Discussed the importance of maintaining nutrition/weights during treatment Educated on low residue diet, handout provided  Encouraged small frequent meals and snacks Encouraged drinking 2-3 Ensure daily Discussed importance of hydration, patient will aim to drink 4 (16 oz) bottles/day Complimentary case of Ensure given today Patient and daughter given RD contact information  MONITORING, EVALUATION, GOAL:   Weight trends, intake   Next Visit: Friday May 6 in clinic

## 2020-09-26 ENCOUNTER — Inpatient Hospital Stay: Payer: Medicare Other

## 2020-09-26 ENCOUNTER — Inpatient Hospital Stay (HOSPITAL_BASED_OUTPATIENT_CLINIC_OR_DEPARTMENT_OTHER): Payer: Medicare Other | Admitting: Oncology

## 2020-09-26 ENCOUNTER — Other Ambulatory Visit: Payer: Self-pay

## 2020-09-26 ENCOUNTER — Telehealth: Payer: Self-pay | Admitting: *Deleted

## 2020-09-26 ENCOUNTER — Ambulatory Visit
Admission: RE | Admit: 2020-09-26 | Discharge: 2020-09-26 | Disposition: A | Discharge status: Home / Self Care | Payer: Medicare Other | Source: Ambulatory Visit | Attending: Radiation Oncology | Admitting: Radiation Oncology

## 2020-09-26 VITALS — BP 158/65 | HR 64 | Temp 98.2°F | Resp 18 | Ht 72.0 in | Wt 173.0 lb

## 2020-09-26 DIAGNOSIS — Z510 Encounter for antineoplastic radiation therapy: Secondary | ICD-10-CM | POA: Diagnosis not present

## 2020-09-26 DIAGNOSIS — C20 Malignant neoplasm of rectum: Secondary | ICD-10-CM

## 2020-09-26 DIAGNOSIS — I6310 Cerebral infarction due to embolism of unspecified precerebral artery: Secondary | ICD-10-CM

## 2020-09-26 LAB — CMP (CANCER CENTER ONLY)
ALT: 8 U/L (ref 0–44)
AST: 28 U/L (ref 15–41)
Albumin: 4.2 g/dL (ref 3.5–5.0)
Alkaline Phosphatase: 55 U/L (ref 38–126)
Anion gap: 10 (ref 5–15)
BUN: 42 mg/dL — ABNORMAL HIGH (ref 8–23)
CO2: 23 mmol/L (ref 22–32)
Calcium: 9.2 mg/dL (ref 8.9–10.3)
Chloride: 107 mmol/L (ref 98–111)
Creatinine: 1.81 mg/dL — ABNORMAL HIGH (ref 0.61–1.24)
GFR, Estimated: 36 mL/min — ABNORMAL LOW (ref >60)
Glucose, Bld: 97 mg/dL (ref 70–99)
Potassium: 4.5 mmol/L (ref 3.5–5.1)
Sodium: 140 mmol/L (ref 135–145)
Total Bilirubin: 0.3 mg/dL (ref 0.3–1.2)
Total Protein: 7.4 g/dL (ref 6.5–8.1)

## 2020-09-26 LAB — CBC WITH DIFFERENTIAL (CANCER CENTER ONLY)
Abs Immature Granulocytes: 0.03 10*3/uL (ref 0.00–0.07)
Basophils Absolute: 0 10*3/uL (ref 0.0–0.1)
Basophils Relative: 0 %
Eosinophils Absolute: 0.2 10*3/uL (ref 0.0–0.5)
Eosinophils Relative: 4 %
HCT: 28.6 % — ABNORMAL LOW (ref 39.0–52.0)
Hemoglobin: 9 g/dL — ABNORMAL LOW (ref 13.0–17.0)
Immature Granulocytes: 1 %
Lymphocytes Relative: 10 %
Lymphs Abs: 0.4 10*3/uL — ABNORMAL LOW (ref 0.7–4.0)
MCH: 25 pg — ABNORMAL LOW (ref 26.0–34.0)
MCHC: 31.5 g/dL (ref 30.0–36.0)
MCV: 79.4 fL — ABNORMAL LOW (ref 80.0–100.0)
Monocytes Absolute: 0.4 10*3/uL (ref 0.1–1.0)
Monocytes Relative: 10 %
Neutro Abs: 2.9 10*3/uL (ref 1.7–7.7)
Neutrophils Relative %: 75 %
Platelet Count: 139 10*3/uL — ABNORMAL LOW (ref 150–400)
RBC: 3.6 MIL/uL — ABNORMAL LOW (ref 4.22–5.81)
RDW: 22.9 % — ABNORMAL HIGH (ref 11.5–15.5)
WBC Count: 3.9 10*3/uL — ABNORMAL LOW (ref 4.0–10.5)
nRBC: 0 % (ref 0.0–0.2)

## 2020-09-26 NOTE — Telephone Encounter (Signed)
Per Dr. Benay Spice: Labs reflect that he is most likely iron deficient. Suggests taking ferrous sulfate 325 mg bid. Left this message on daughter's voice mail and sent via [facility] as well. Requested return call to confirm receipt.

## 2020-09-26 NOTE — Progress Notes (Signed)
  [facility] OFFICE PROGRESS NOTE   Diagnosis: Rectal cancer  INTERVAL HISTORY:   Isaac Phelps returns as scheduled.  He began concurrent capecitabine and radiation on 09/12/2020.  He denies nausea, mouth sores, diarrhea, and hand/foot skin changes.  He has "stiffness "in the feet that predated chemotherapy.  He no longer has rectal bleeding.  Objective:  Vital signs in last 24 hours:  Blood pressure (!) 158/65, pulse 64, temperature 98.2 F (36.8 C), temperature source Tympanic, resp. rate 18, height 6' (1.829 m), weight 173 lb (78.5 kg), SpO2 100 %.    HEENT: No thrush or ulcers Resp: Lungs clear bilaterally Cardio: Regular rate and rhythm GI: No hepatosplenomegaly, nontender Vascular: No leg edema  Skin: Dryness of the soles without erythema or skin breakdown.  Palms without erythema  Portacath/PICC-without erythema  Lab Results:  Lab Results  Component Value Date   WBC 3.9 (L) 09/26/2020   HGB 9.0 (L) 09/26/2020   HCT 28.6 (L) 09/26/2020   MCV 79.4 (L) 09/26/2020   PLT 139 (L) 09/26/2020   NEUTROABS PENDING 09/26/2020    CMP  Lab Results  Component Value Date   NA 143 08/24/2020   K 4.7 08/24/2020   CL 109 08/24/2020   CO2 23 08/24/2020   GLUCOSE 112 (H) 08/24/2020   BUN 30 (H) 08/24/2020   CREATININE 2.13 (H) 08/24/2020   CALCIUM 9.3 08/24/2020   PROT 8.1 08/24/2020   ALBUMIN 4.2 08/24/2020   AST 29 08/24/2020   ALT 11 08/24/2020   ALKPHOS 82 08/24/2020   BILITOT 0.4 08/24/2020   GFRNONAA 29 (L) 08/24/2020   GFRAA 42 (L) 02/14/2015    Lab Results  Component Value Date   CEA1 4.07 08/24/2020     Medications: I have reviewed the patient's current medications.   Assessment/Plan: 1. Rectal cancer ? Colonoscopy 08/03/2020-distal rectal mass abutting the anus, biopsy confirmed adenocarcinoma ? CT angiography 07/29/2020-hazy soft tissue at the porta hepatis encasing the right hepatic artery, exophytic soft tissue lesion at the upper pole  of the left kidney-indeterminate, partially solid nodule in the left lower lobe-present on a CT in 2013 suspicious, suspicious for a low-grade pulmonary neoplasm, enlarged prostate, ? MRI abdomen 07/30/2020-no evidence of mass in the porta hepatis, hypoenhancing lesions in the upper pole of the left kidney and interpolar region of the right kidney suspicious for small neoplasms, hemosiderosis of the liver and spleen ? MRI pelvis 08/17/2020-T2, N0; minimal if any distance from tumor to internal anal sphincter ? Radiation and capecitabine for 1122 2. Enlarged prostate with an elevated PSA 3. 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 Hypertension 5. Gout 6. History of coronary artery disease 7. Iron deficiency anemia-Feraheme 07/30/2020 8. B12 deficiency-07/31/2020 B12 161, on oral B12    Disposition: Isaac Phelps has completed 2 weeks of radiation and capecitabine.  He is tolerating the treatment well.  He will continue the current treatment.  We will follow up on the neutrophil count from today.  He will return for an office and lab visit in 2 weeks.  We will recommend he begin ferrous sulfate.  He will call for a fever or bleeding.  Betsy Coder, MD  09/26/2020  2:57 PM

## 2020-09-27 ENCOUNTER — Other Ambulatory Visit (HOSPITAL_COMMUNITY): Payer: Self-pay

## 2020-09-27 ENCOUNTER — Ambulatory Visit
Admission: RE | Admit: 2020-09-27 | Discharge: 2020-09-27 | Disposition: A | Discharge status: Home / Self Care | Payer: Medicare Other | Source: Ambulatory Visit | Attending: Radiation Oncology | Admitting: Radiation Oncology

## 2020-09-27 DIAGNOSIS — Z510 Encounter for antineoplastic radiation therapy: Secondary | ICD-10-CM | POA: Diagnosis not present

## 2020-09-28 ENCOUNTER — Ambulatory Visit
Admission: RE | Admit: 2020-09-28 | Discharge: 2020-09-28 | Disposition: A | Discharge status: Home / Self Care | Payer: Medicare Other | Source: Ambulatory Visit | Attending: Radiation Oncology | Admitting: Radiation Oncology

## 2020-09-28 ENCOUNTER — Other Ambulatory Visit: Payer: Self-pay

## 2020-09-28 DIAGNOSIS — Z510 Encounter for antineoplastic radiation therapy: Secondary | ICD-10-CM | POA: Diagnosis not present

## 2020-09-29 ENCOUNTER — Other Ambulatory Visit (HOSPITAL_COMMUNITY): Payer: Self-pay

## 2020-09-29 ENCOUNTER — Ambulatory Visit
Admission: RE | Admit: 2020-09-29 | Discharge: 2020-09-29 | Disposition: A | Discharge status: Home / Self Care | Payer: Medicare Other | Source: Ambulatory Visit | Attending: Radiation Oncology | Admitting: Radiation Oncology

## 2020-09-29 DIAGNOSIS — Z510 Encounter for antineoplastic radiation therapy: Secondary | ICD-10-CM | POA: Diagnosis not present

## 2020-09-30 ENCOUNTER — Other Ambulatory Visit: Payer: Self-pay

## 2020-09-30 ENCOUNTER — Ambulatory Visit
Admission: RE | Admit: 2020-09-30 | Discharge: 2020-09-30 | Disposition: A | Discharge status: Home / Self Care | Payer: Medicare Other | Source: Ambulatory Visit | Attending: Radiation Oncology | Admitting: Radiation Oncology

## 2020-09-30 DIAGNOSIS — Z510 Encounter for antineoplastic radiation therapy: Secondary | ICD-10-CM | POA: Diagnosis not present

## 2020-10-03 ENCOUNTER — Other Ambulatory Visit (HOSPITAL_COMMUNITY): Payer: Self-pay

## 2020-10-03 ENCOUNTER — Ambulatory Visit
Admission: RE | Admit: 2020-10-03 | Discharge: 2020-10-03 | Disposition: A | Discharge status: Home / Self Care | Payer: Medicare Other | Source: Ambulatory Visit | Attending: Radiation Oncology | Admitting: Radiation Oncology

## 2020-10-03 DIAGNOSIS — Z7982 Long term (current) use of aspirin: Secondary | ICD-10-CM | POA: Insufficient documentation

## 2020-10-03 DIAGNOSIS — E538 Deficiency of other specified B group vitamins: Secondary | ICD-10-CM | POA: Insufficient documentation

## 2020-10-03 DIAGNOSIS — D509 Iron deficiency anemia, unspecified: Secondary | ICD-10-CM | POA: Diagnosis not present

## 2020-10-03 DIAGNOSIS — I10 Essential (primary) hypertension: Secondary | ICD-10-CM | POA: Diagnosis not present

## 2020-10-03 DIAGNOSIS — Z8673 Personal history of transient ischemic attack (TIA), and cerebral infarction without residual deficits: Secondary | ICD-10-CM | POA: Insufficient documentation

## 2020-10-03 DIAGNOSIS — C20 Malignant neoplasm of rectum: Secondary | ICD-10-CM | POA: Diagnosis present

## 2020-10-03 DIAGNOSIS — Z79899 Other long term (current) drug therapy: Secondary | ICD-10-CM | POA: Diagnosis not present

## 2020-10-03 DIAGNOSIS — R319 Hematuria, unspecified: Secondary | ICD-10-CM | POA: Diagnosis not present

## 2020-10-03 DIAGNOSIS — Z510 Encounter for antineoplastic radiation therapy: Secondary | ICD-10-CM | POA: Insufficient documentation

## 2020-10-03 DIAGNOSIS — N400 Enlarged prostate without lower urinary tract symptoms: Secondary | ICD-10-CM | POA: Diagnosis not present

## 2020-10-04 ENCOUNTER — Ambulatory Visit
Admission: RE | Admit: 2020-10-04 | Discharge: 2020-10-04 | Disposition: A | Discharge status: Home / Self Care | Payer: Medicare Other | Source: Ambulatory Visit | Attending: Radiation Oncology | Admitting: Radiation Oncology

## 2020-10-04 ENCOUNTER — Other Ambulatory Visit: Payer: Self-pay

## 2020-10-04 DIAGNOSIS — Z510 Encounter for antineoplastic radiation therapy: Secondary | ICD-10-CM | POA: Diagnosis not present

## 2020-10-05 ENCOUNTER — Other Ambulatory Visit (HOSPITAL_COMMUNITY): Payer: Self-pay

## 2020-10-05 ENCOUNTER — Ambulatory Visit
Admission: RE | Admit: 2020-10-05 | Discharge: 2020-10-05 | Disposition: A | Discharge status: Home / Self Care | Payer: Medicare Other | Source: Ambulatory Visit | Attending: Radiation Oncology | Admitting: Radiation Oncology

## 2020-10-05 DIAGNOSIS — Z510 Encounter for antineoplastic radiation therapy: Secondary | ICD-10-CM | POA: Diagnosis not present

## 2020-10-06 ENCOUNTER — Ambulatory Visit (INDEPENDENT_AMBULATORY_CARE_PROVIDER_SITE_OTHER): Payer: Medicare Other | Admitting: Adult Health

## 2020-10-06 ENCOUNTER — Other Ambulatory Visit: Payer: Self-pay

## 2020-10-06 ENCOUNTER — Ambulatory Visit
Admission: RE | Admit: 2020-10-06 | Discharge: 2020-10-06 | Disposition: A | Discharge status: Home / Self Care | Payer: Medicare Other | Source: Ambulatory Visit | Attending: Radiation Oncology | Admitting: Radiation Oncology

## 2020-10-06 ENCOUNTER — Encounter: Payer: Self-pay | Admitting: Adult Health

## 2020-10-06 VITALS — BP 160/70 | HR 90 | Temp 98.3°F | Ht 72.0 in | Wt 171.6 lb

## 2020-10-06 DIAGNOSIS — E785 Hyperlipidemia, unspecified: Secondary | ICD-10-CM | POA: Diagnosis not present

## 2020-10-06 DIAGNOSIS — C20 Malignant neoplasm of rectum: Secondary | ICD-10-CM | POA: Diagnosis not present

## 2020-10-06 DIAGNOSIS — I10 Essential (primary) hypertension: Secondary | ICD-10-CM | POA: Diagnosis not present

## 2020-10-06 DIAGNOSIS — I6310 Cerebral infarction due to embolism of unspecified precerebral artery: Secondary | ICD-10-CM

## 2020-10-06 DIAGNOSIS — N400 Enlarged prostate without lower urinary tract symptoms: Secondary | ICD-10-CM | POA: Diagnosis not present

## 2020-10-06 DIAGNOSIS — Z510 Encounter for antineoplastic radiation therapy: Secondary | ICD-10-CM | POA: Diagnosis not present

## 2020-10-06 MED ORDER — HYDRALAZINE HCL 50 MG PO TABS
50.0000 mg | ORAL_TABLET | Freq: Three times a day (TID) | ORAL | 1 refills | Status: DC
Start: 2020-10-06 — End: 2020-12-13

## 2020-10-06 MED ORDER — AMLODIPINE BESYLATE 10 MG PO TABS
10.0000 mg | ORAL_TABLET | Freq: Every day | ORAL | 1 refills | Status: DC
Start: 2020-10-06 — End: 2020-12-13

## 2020-10-06 MED ORDER — TAMSULOSIN HCL 0.4 MG PO CAPS: 0.4000 mg | ORAL_CAPSULE | Freq: Every day | ORAL | 1 refills | Status: DC

## 2020-10-06 MED ORDER — CARVEDILOL 3.125 MG PO TABS: 3.1250 mg | ORAL_TABLET | Freq: Two times a day (BID) | ORAL | 1 refills | Status: DC

## 2020-10-06 MED ORDER — SIMVASTATIN 40 MG PO TABS: 40.0000 mg | ORAL_TABLET | Freq: Every day | ORAL | 1 refills | Status: DC

## 2020-10-06 NOTE — Progress Notes (Signed)
Subjective:    Patient ID: Isaac Phelps, male    DOB: [birth year], 87 y.o.   MRN: [id number]  HPI  87 year old male who  has a past medical history of Adenocarcinoma of rectum ([place]), Anxiety, BENIGN PROSTATIC HYPERTROPHY (04/01/2008), Coronary artery disease, GERD (gastroesophageal reflux disease), HYPERLIPIDEMIA (05/26/2007), HYPERTENSION (02/07/2007), Syncope and collapse, and TOBACCO ABUSE (04/01/2008).  He presents to the office today for medication refills for chronic medication condition s  HTN -currently prescribed Norvasc 10 mg daily, Coreg 3.125 mg twice daily, and hydralazine 50 mg every 8 hours.  He does not check his blood pressures at home.  Reports that he is out of of his medications. BP Readings from Last 3 Encounters:  10/06/20 (!) 160/70  09/26/20 (!) 158/65  09/06/20 (!) 161/69   Hyperlipidemia -takes simvastatin 40 mg daily.  He denies myalgia or fatigue Lab Results  Component Value Date   CHOL 187 08/01/2020   HDL 34 (L) 08/01/2020   LDLCALC 134 (H) 08/01/2020   LDLDIRECT 151.4 05/26/2007   TRIG 94 08/01/2020   CHOLHDL 5.5 08/01/2020   BPH -describe Flomax 0.4 mg daily.  He has been out of this medication and since being out has noticed increasing symptoms such as incomplete bladder emptying, decreased stream, nocturia  Rectal Carcinoma -this was found during a hospital admission on 07/29/2020.  He had a rectal carcinoma with a 2.2 cm left lower lobe lung nodule, right kidney lesion.  He began concurrent chemo and radiation on 09/12/2020.  Reports that he is doing well with treatment but is ready for it to be finished.  He denies rectal bleeding, nausea, diarrhea, or skin changes.  Review of Systems See HPI   Past Medical History:  Diagnosis Date  . Adenocarcinoma of rectum ([place])   . Anxiety   . BENIGN PROSTATIC HYPERTROPHY 04/01/2008  . Coronary artery disease   . GERD (gastroesophageal reflux disease)   . HYPERLIPIDEMIA 05/26/2007  . HYPERTENSION 02/07/2007   . Syncope and collapse   . TOBACCO ABUSE 04/01/2008    Social History   Socioeconomic History  . Marital status: Married    Spouse name: Not on file  . Number of children: Not on file  . Years of education: Not on file  . Highest education level: Not on file  Occupational History  . Not on file  Tobacco Use  . Smoking status: Former Smoker    Packs/day: 0.30    Types: Cigarettes  . Smokeless tobacco: Never Used  Vaping Use  . Vaping Use: Never used  Substance and Sexual Activity  . Alcohol use: No  . Drug use: No  . Sexual activity: Not on file  Other Topics Concern  . Not on file  Social History Narrative  . Not on file   Social Determinants of Health   Financial Resource Strain: Not on file  Food Insecurity: Not on file  Transportation Needs: Not on file  Physical Activity: Not on file  Stress: Not on file  Social Connections: Not on file  Intimate Partner Violence: Not on file    Past Surgical History:  Procedure Laterality Date  . APPENDECTOMY    . BIOPSY  08/03/2020   Procedure: BIOPSY;  Surgeon: Gatha Mayer, MD;  Location: [facility];  Service: Endoscopy;;  . COLONOSCOPY WITH PROPOFOL N/A 08/03/2020   Procedure: COLONOSCOPY WITH PROPOFOL;  Surgeon: Gatha Mayer, MD;  Location: [facility];  Service: Endoscopy;  Laterality: N/A;  . ESOPHAGOGASTRODUODENOSCOPY (EGD) WITH  PROPOFOL N/A 08/03/2020   Procedure: ESOPHAGOGASTRODUODENOSCOPY (EGD) WITH PROPOFOL;  Surgeon: Gatha Mayer, MD;  Location: [facility];  Service: Endoscopy;  Laterality: N/A;  . TONSILLECTOMY      Family History  Problem Relation Age of Onset  . Arthritis Neg Hx        family  . Hypertension Neg Hx        family  . Stroke Neg Hx        family , 1st degree relative    No Known Allergies  Current Outpatient Medications on File Prior to Visit  Medication Sig Dispense Refill  . acetaminophen (TYLENOL) 650 MG CR tablet Take 650 mg by mouth daily as needed for pain.    .  capecitabine (XELODA) 500 MG tablet TAKE 3 TABS (1500 MG) BY MOUTH IN AM, 2 TABS (1000 MG) IN PM, (TOTAL DOSE 2500 MG DAILY). TAKE WITHIN 30 MINUTES AFTER MEALS. TAKE ON DAYS OF RADIATION ONLY 140 tablet 0  . ferrous sulfate 325 (65 FE) MG EC tablet Take 325 mg by mouth 2 (two) times daily with a meal.    . polyvinyl alcohol (LIQUIFILM TEARS) 1.4 % ophthalmic solution Place 1 drop into both eyes daily as needed for dry eyes.     No current facility-administered medications on file prior to visit.    BP (!) 160/70 (BP Location: Right Arm, Patient Position: Sitting, Cuff Size: Normal)   Pulse 90   Temp 98.3 F (36.8 C) (Oral)   Ht 6' (1.829 m)   Wt 171 lb 9.6 oz (77.8 kg)   SpO2 98%   BMI 23.27 kg/m       Objective:   Physical Exam Vitals and nursing note reviewed.  Constitutional:      Appearance: Normal appearance.  Cardiovascular:     Rate and Rhythm: Normal rate and regular rhythm.     Pulses: Normal pulses.     Heart sounds: Normal heart sounds.  Pulmonary:     Effort: Pulmonary effort is normal.     Breath sounds: Normal breath sounds.  Abdominal:     General: Abdomen is flat. Bowel sounds are normal.     Palpations: Abdomen is soft.  Musculoskeletal:        General: Normal range of motion.  Skin:    General: Skin is warm and dry.  Neurological:     General: No focal deficit present.     Mental Status: He is alert and oriented to person, place, and time.  Psychiatric:        Mood and Affect: Mood normal.        Behavior: Behavior normal.        Thought Content: Thought content normal.        Judgment: Judgment normal.       Assessment & Plan:  1. Essential hypertension - BP elevated in the office today but he has been out of medications. Will continue to monitor  - amLODipine (NORVASC) 10 MG tablet; Take 1 tablet (10 mg total) by mouth daily.  Dispense: 90 tablet; Refill: 1 - carvedilol (COREG) 3.125 MG tablet; Take 1 tablet (3.125 mg total) by mouth 2 (two)  times daily with a meal.  Dispense: 180 tablet; Refill: 1 - hydrALAZINE (APRESOLINE) 50 MG tablet; Take 1 tablet (50 mg total) by mouth every 8 (eight) hours.  Dispense: 90 tablet; Refill: 1  2. Benign prostatic hyperplasia without lower urinary tract symptoms - tamsulosin (FLOMAX) 0.4 MG CAPS capsule; Take 1 capsule (  0.4 mg total) by mouth daily.  Dispense: 90 capsule; Refill: 1  3. Dyslipidemia  - simvastatin (ZOCOR) 40 MG tablet; Take 1 tablet (40 mg total) by mouth daily.  Dispense: 90 tablet; Refill: 1  4. Adenocarcinoma of rectum ([place]) - Follow up with Oncology as directed   Dorothyann Peng, NP

## 2020-10-06 NOTE — Patient Instructions (Signed)
It was great seeing you today   I have sent in all your medications   Please follow up in 6 months

## 2020-10-07 ENCOUNTER — Inpatient Hospital Stay: Payer: Medicare Other | Admitting: Dietician

## 2020-10-07 ENCOUNTER — Ambulatory Visit
Admission: RE | Admit: 2020-10-07 | Discharge: 2020-10-07 | Disposition: A | Discharge status: Home / Self Care | Payer: Medicare Other | Source: Ambulatory Visit | Attending: Radiation Oncology | Admitting: Radiation Oncology

## 2020-10-07 DIAGNOSIS — D509 Iron deficiency anemia, unspecified: Secondary | ICD-10-CM | POA: Insufficient documentation

## 2020-10-07 DIAGNOSIS — Z79899 Other long term (current) drug therapy: Secondary | ICD-10-CM | POA: Insufficient documentation

## 2020-10-07 DIAGNOSIS — E538 Deficiency of other specified B group vitamins: Secondary | ICD-10-CM | POA: Insufficient documentation

## 2020-10-07 DIAGNOSIS — Z510 Encounter for antineoplastic radiation therapy: Secondary | ICD-10-CM | POA: Diagnosis not present

## 2020-10-07 DIAGNOSIS — N400 Enlarged prostate without lower urinary tract symptoms: Secondary | ICD-10-CM | POA: Insufficient documentation

## 2020-10-07 DIAGNOSIS — Z7982 Long term (current) use of aspirin: Secondary | ICD-10-CM | POA: Insufficient documentation

## 2020-10-07 DIAGNOSIS — Z8673 Personal history of transient ischemic attack (TIA), and cerebral infarction without residual deficits: Secondary | ICD-10-CM | POA: Insufficient documentation

## 2020-10-07 DIAGNOSIS — I10 Essential (primary) hypertension: Secondary | ICD-10-CM | POA: Insufficient documentation

## 2020-10-07 DIAGNOSIS — C20 Malignant neoplasm of rectum: Secondary | ICD-10-CM | POA: Insufficient documentation

## 2020-10-07 NOTE — Progress Notes (Signed)
Nutrition Follow-up:  Patient with rectal cancer. He is receiving concurrent radiation and capecitabine.   Met with patient in clinic. He reports some fatigue and decrease in appetite. Patinet unable to recall what he ate yesterday. He reports eating eggs every morning with a cup of coffee or orange juice. Recently he has had rice/gravy, candied yams, pintos, black eyed peas, snacks on "little bit" of pork rinds and canned peaches. Patient is drinking "maybe" 3 Ensure and about 2 bottles of water each day. Patient reports he has enough Ensure at home. He reports regular bowel movements daily, having some soreness when wiping. He denies nausea, vomiting, altered taste.    Medications: Ferrous sulfate  Labs: reviewed  Anthropometrics: Weight 171 lb 9.6 oz on 5/5 decreased 1.7% in 10 days; concerning  4/25 - 173 lb 4/5 - 173 lb 12.8 oz 3/23 - 175 lb 12.8 oz   NUTRITION DIAGNOSIS: Food and nutrition knowledge deficit ongoing   INTERVENTION:  Reviewed low-residue diet  Encouraged small frequent meals and snacks Encouraged high protein snacks between meals, provided easy prepared ideas (peanut butter/cheese crackers, [place] tuna creations, hard boiled eggs) Pt will try to drink 4 Ensure Enlive daily Pt has contact information    MONITORING, EVALUATION, GOAL: weight trends, intake   NEXT VISIT: Wednesday May 11

## 2020-10-10 ENCOUNTER — Telehealth: Payer: Self-pay | Admitting: Adult Health

## 2020-10-10 ENCOUNTER — Telehealth: Payer: Self-pay

## 2020-10-10 ENCOUNTER — Inpatient Hospital Stay (HOSPITAL_BASED_OUTPATIENT_CLINIC_OR_DEPARTMENT_OTHER): Payer: Medicare Other | Admitting: Nurse Practitioner

## 2020-10-10 ENCOUNTER — Other Ambulatory Visit: Payer: Self-pay

## 2020-10-10 ENCOUNTER — Inpatient Hospital Stay: Payer: Medicare Other

## 2020-10-10 ENCOUNTER — Ambulatory Visit
Admission: RE | Admit: 2020-10-10 | Discharge: 2020-10-10 | Disposition: A | Discharge status: Home / Self Care | Payer: Medicare Other | Source: Ambulatory Visit | Attending: Radiation Oncology | Admitting: Radiation Oncology

## 2020-10-10 ENCOUNTER — Encounter: Payer: Self-pay | Admitting: Nurse Practitioner

## 2020-10-10 VITALS — BP 145/73 | HR 62 | Temp 98.2°F | Resp 18 | Ht 72.0 in | Wt 171.4 lb

## 2020-10-10 DIAGNOSIS — C20 Malignant neoplasm of rectum: Secondary | ICD-10-CM

## 2020-10-10 DIAGNOSIS — Z510 Encounter for antineoplastic radiation therapy: Secondary | ICD-10-CM | POA: Diagnosis not present

## 2020-10-10 DIAGNOSIS — I6310 Cerebral infarction due to embolism of unspecified precerebral artery: Secondary | ICD-10-CM

## 2020-10-10 LAB — CBC WITH DIFFERENTIAL (CANCER CENTER ONLY)
Abs Immature Granulocytes: 0.03 10*3/uL (ref 0.00–0.07)
Basophils Absolute: 0 10*3/uL (ref 0.0–0.1)
Basophils Relative: 0 %
Eosinophils Absolute: 0.3 10*3/uL (ref 0.0–0.5)
Eosinophils Relative: 5 %
HCT: 29.9 % — ABNORMAL LOW (ref 39.0–52.0)
Hemoglobin: 9.3 g/dL — ABNORMAL LOW (ref 13.0–17.0)
Immature Granulocytes: 1 %
Lymphocytes Relative: 5 %
Lymphs Abs: 0.3 10*3/uL — ABNORMAL LOW (ref 0.7–4.0)
MCH: 25.4 pg — ABNORMAL LOW (ref 26.0–34.0)
MCHC: 31.1 g/dL (ref 30.0–36.0)
MCV: 81.7 fL (ref 80.0–100.0)
Monocytes Absolute: 0.5 10*3/uL (ref 0.1–1.0)
Monocytes Relative: 9 %
Neutro Abs: 4.7 10*3/uL (ref 1.7–7.7)
Neutrophils Relative %: 80 %
Platelet Count: 155 10*3/uL (ref 150–400)
RBC: 3.66 MIL/uL — ABNORMAL LOW (ref 4.22–5.81)
RDW: 23.9 % — ABNORMAL HIGH (ref 11.5–15.5)
WBC Count: 5.8 10*3/uL (ref 4.0–10.5)
nRBC: 0 % (ref 0.0–0.2)

## 2020-10-10 LAB — CMP (CANCER CENTER ONLY)
ALT: 11 U/L (ref 0–44)
AST: 32 U/L (ref 15–41)
Albumin: 4.1 g/dL (ref 3.5–5.0)
Alkaline Phosphatase: 56 U/L (ref 38–126)
Anion gap: 9 (ref 5–15)
BUN: 33 mg/dL — ABNORMAL HIGH (ref 8–23)
CO2: 25 mmol/L (ref 22–32)
Calcium: 9 mg/dL (ref 8.9–10.3)
Chloride: 104 mmol/L (ref 98–111)
Creatinine: 1.85 mg/dL — ABNORMAL HIGH (ref 0.61–1.24)
GFR, Estimated: 35 mL/min — ABNORMAL LOW (ref >60)
Glucose, Bld: 113 mg/dL — ABNORMAL HIGH (ref 70–99)
Potassium: 4.2 mmol/L (ref 3.5–5.1)
Sodium: 138 mmol/L (ref 135–145)
Total Bilirubin: 0.2 mg/dL — ABNORMAL LOW (ref 0.3–1.2)
Total Protein: 7.6 g/dL (ref 6.5–8.1)

## 2020-10-10 NOTE — Progress Notes (Signed)
  [facility] OFFICE PROGRESS NOTE   Diagnosis: Rectal cancer  INTERVAL HISTORY:   Mr. Jallow returns as scheduled.  He continues concurrent radiation/Xeloda.  He denies nausea/vomiting.  No mouth sores.  No diarrhea.  No hand or foot pain or redness.  He reports soreness at the rectum for the past week.  He denies chest pain.  No shortness of breath.  Objective:  Vital signs in last 24 hours:  Blood pressure (!) 145/73, pulse 62, temperature 98.2 F (36.8 C), temperature source Oral, resp. rate 18, height 6' (1.829 m), weight 171 lb 6.4 oz (77.7 kg), SpO2 100 %.    HEENT: No thrush or ulcers. Resp: Lungs clear bilaterally. Cardio: Regular, markedly tachycardic. GI: No hepatomegaly.  Small area of superficial ulceration right upper perianal skin region. Vascular: No leg edema. Skin: Palms without erythema.   Lab Results:  Lab Results  Component Value Date   WBC 5.8 10/10/2020   HGB 9.3 (L) 10/10/2020   HCT 29.9 (L) 10/10/2020   MCV 81.7 10/10/2020   PLT 155 10/10/2020   NEUTROABS 4.7 10/10/2020    Imaging:  No results found.  Medications: I have reviewed the patient's current medications.  Assessment/Plan: 1. Rectal cancer ? Colonoscopy 08/03/2020-distal rectal mass abutting the anus, biopsy confirmed adenocarcinoma ? CT angiography 07/29/2020-hazy soft tissue at the porta hepatis encasing the right hepatic artery, exophytic soft tissue lesion at the upper pole of the left kidney-indeterminate, partially solid nodule in the left lower lobe-present on a CT in 2013 suspicious, suspicious for a low-grade pulmonary neoplasm, enlarged prostate, ? MRI abdomen 07/30/2020-no evidence of mass in the porta hepatis, hypoenhancing lesions in the upper pole of the left kidney and interpolar region of the right kidney suspicious for small neoplasms, hemosiderosis of the liver and spleen ? MRI pelvis 08/17/2020-T2, N0; minimal if any distance from tumor to internal anal  sphincter ? Radiation and capecitabine 09/12/2020 2. Enlarged prostate with an elevated PSA 3. 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 Hypertension 5. Gout 6. History of coronary artery disease 7. Iron deficiency anemia-Feraheme 07/30/2020 8. B12 deficiency-07/31/2020 B12 161, on oral B12   Disposition: Mr. Peterka appears unchanged.  He continues radiation/Xeloda for treatment of rectal cancer.  He is tolerating Xeloda without significant acute toxicity.  We reviewed the CBC from today.  Counts are stable.  He had an episode of transient marked tachycardia while in the office, blood pressure lower than baseline.  He was asymptomatic.  We did an EKG which returned with a heart rate of 64, normal sinus rhythm.  Repeat blood pressure was at baseline.  We contacted PCPs office regarding the above.  They will follow-up with him.  He appears stable for discharge home.  He will return for lab and follow-up here coinciding with completion of radiation on 10/21/2020.  We are available to see him sooner if needed.  Plan reviewed with Dr. Benay Spice.  Ned Card ANP/GNP-BC   10/10/2020  2:08 PM

## 2020-10-10 NOTE — Telephone Encounter (Signed)
Stephanie w/[facility] is calling for Ned Card, NP she is concerned about the pts blood pressure while th pt was there at the initial start the pts blood pressure was 113/77 w/pulse 143 and 15 minutes later it was 145/73 with a pulse of 62. Lattie Haw, NP would like to see if we will be ordering a cardiac monitor for the pt.  Lattie Haw would like to have a call back.

## 2020-10-10 NOTE — Telephone Encounter (Signed)
This nurse spoke with PCP office made aware of pt elevated VS on arrival and after a period of rest x15-20 minutes pt VS return to baseline PCP aware pt just received new cardiovascular medications this nurse spoke with pt to try and make sure pt was taking medications as prescribed. PCP office to foolow up tomorrow with pt

## 2020-10-11 ENCOUNTER — Ambulatory Visit
Admission: RE | Admit: 2020-10-11 | Discharge: 2020-10-11 | Disposition: A | Discharge status: Home / Self Care | Payer: Medicare Other | Source: Ambulatory Visit | Attending: Radiation Oncology | Admitting: Radiation Oncology

## 2020-10-11 ENCOUNTER — Other Ambulatory Visit: Payer: Self-pay | Admitting: Adult Health

## 2020-10-11 DIAGNOSIS — Z510 Encounter for antineoplastic radiation therapy: Secondary | ICD-10-CM | POA: Diagnosis not present

## 2020-10-11 DIAGNOSIS — R000 Tachycardia, unspecified: Secondary | ICD-10-CM

## 2020-10-11 NOTE — Telephone Encounter (Signed)
Please advise 

## 2020-10-11 NOTE — Telephone Encounter (Signed)
I left Isaac Phelps's office a voicemail letting them know that we did order the monitor.

## 2020-10-12 ENCOUNTER — Inpatient Hospital Stay: Payer: Medicare Other | Admitting: Dietician

## 2020-10-12 ENCOUNTER — Ambulatory Visit
Admission: RE | Admit: 2020-10-12 | Discharge: 2020-10-12 | Disposition: A | Discharge status: Home / Self Care | Payer: Medicare Other | Source: Ambulatory Visit | Attending: Radiation Oncology | Admitting: Radiation Oncology

## 2020-10-12 ENCOUNTER — Other Ambulatory Visit: Payer: Self-pay

## 2020-10-12 DIAGNOSIS — Z510 Encounter for antineoplastic radiation therapy: Secondary | ICD-10-CM | POA: Diagnosis not present

## 2020-10-12 NOTE — Progress Notes (Signed)
Nutrition   Patient did not show for scheduled nutrition appointment today.  Lajuan Lines, RD, [place] Cell [phone]

## 2020-10-13 ENCOUNTER — Ambulatory Visit
Admission: RE | Admit: 2020-10-13 | Discharge: 2020-10-13 | Disposition: A | Discharge status: Home / Self Care | Payer: Medicare Other | Source: Ambulatory Visit | Attending: Radiation Oncology | Admitting: Radiation Oncology

## 2020-10-13 DIAGNOSIS — Z510 Encounter for antineoplastic radiation therapy: Secondary | ICD-10-CM | POA: Diagnosis not present

## 2020-10-14 ENCOUNTER — Ambulatory Visit
Admission: RE | Admit: 2020-10-14 | Discharge: 2020-10-14 | Disposition: A | Discharge status: Home / Self Care | Payer: Medicare Other | Source: Ambulatory Visit | Attending: Radiation Oncology | Admitting: Radiation Oncology

## 2020-10-14 DIAGNOSIS — Z510 Encounter for antineoplastic radiation therapy: Secondary | ICD-10-CM | POA: Diagnosis not present

## 2020-10-17 ENCOUNTER — Ambulatory Visit
Admission: RE | Admit: 2020-10-17 | Discharge: 2020-10-17 | Disposition: A | Discharge status: Home / Self Care | Payer: Medicare Other | Source: Ambulatory Visit | Attending: Radiation Oncology | Admitting: Radiation Oncology

## 2020-10-17 ENCOUNTER — Other Ambulatory Visit (HOSPITAL_COMMUNITY): Payer: Self-pay

## 2020-10-17 DIAGNOSIS — Z510 Encounter for antineoplastic radiation therapy: Secondary | ICD-10-CM | POA: Diagnosis not present

## 2020-10-18 ENCOUNTER — Other Ambulatory Visit: Payer: Self-pay

## 2020-10-18 ENCOUNTER — Ambulatory Visit
Admission: RE | Admit: 2020-10-18 | Discharge: 2020-10-18 | Disposition: A | Discharge status: Home / Self Care | Payer: Medicare Other | Source: Ambulatory Visit | Attending: Radiation Oncology | Admitting: Radiation Oncology

## 2020-10-18 DIAGNOSIS — R319 Hematuria, unspecified: Secondary | ICD-10-CM

## 2020-10-18 DIAGNOSIS — Z510 Encounter for antineoplastic radiation therapy: Secondary | ICD-10-CM | POA: Diagnosis not present

## 2020-10-18 DIAGNOSIS — C20 Malignant neoplasm of rectum: Secondary | ICD-10-CM

## 2020-10-18 LAB — CBC WITH DIFFERENTIAL (CANCER CENTER ONLY)
Abs Immature Granulocytes: 0.02 10*3/uL (ref 0.00–0.07)
Basophils Absolute: 0 10*3/uL (ref 0.0–0.1)
Basophils Relative: 0 %
Eosinophils Absolute: 0.4 10*3/uL (ref 0.0–0.5)
Eosinophils Relative: 8 %
HCT: 28 % — ABNORMAL LOW (ref 39.0–52.0)
Hemoglobin: 8.9 g/dL — ABNORMAL LOW (ref 13.0–17.0)
Immature Granulocytes: 0 %
Lymphocytes Relative: 5 %
Lymphs Abs: 0.2 10*3/uL — ABNORMAL LOW (ref 0.7–4.0)
MCH: 26.2 pg (ref 26.0–34.0)
MCHC: 31.8 g/dL (ref 30.0–36.0)
MCV: 82.4 fL (ref 80.0–100.0)
Monocytes Absolute: 0.5 10*3/uL (ref 0.1–1.0)
Monocytes Relative: 11 %
Neutro Abs: 3.6 10*3/uL (ref 1.7–7.7)
Neutrophils Relative %: 76 %
Platelet Count: 203 10*3/uL (ref 150–400)
RBC: 3.4 MIL/uL — ABNORMAL LOW (ref 4.22–5.81)
RDW: 24.4 % — ABNORMAL HIGH (ref 11.5–15.5)
WBC Count: 4.8 10*3/uL (ref 4.0–10.5)
nRBC: 0 % (ref 0.0–0.2)

## 2020-10-18 LAB — CMP (CANCER CENTER ONLY)
ALT: 11 U/L (ref 0–44)
AST: 37 U/L (ref 15–41)
Albumin: 3.9 g/dL (ref 3.5–5.0)
Alkaline Phosphatase: 62 U/L (ref 38–126)
Anion gap: 8 (ref 5–15)
BUN: 36 mg/dL — ABNORMAL HIGH (ref 8–23)
CO2: 26 mmol/L (ref 22–32)
Calcium: 9.4 mg/dL (ref 8.9–10.3)
Chloride: 107 mmol/L (ref 98–111)
Creatinine: 1.97 mg/dL — ABNORMAL HIGH (ref 0.61–1.24)
GFR, Estimated: 32 mL/min — ABNORMAL LOW (ref >60)
Glucose, Bld: 100 mg/dL — ABNORMAL HIGH (ref 70–99)
Potassium: 4.4 mmol/L (ref 3.5–5.1)
Sodium: 141 mmol/L (ref 135–145)
Total Bilirubin: 0.4 mg/dL (ref 0.3–1.2)
Total Protein: 7.5 g/dL (ref 6.5–8.1)

## 2020-10-18 LAB — URINALYSIS, ROUTINE W REFLEX MICROSCOPIC
Bacteria, UA: NONE SEEN
Bilirubin Urine: NEGATIVE
Glucose, UA: NEGATIVE mg/dL
Hgb urine dipstick: NEGATIVE
Ketones, ur: NEGATIVE mg/dL
Leukocytes,Ua: NEGATIVE
Nitrite: NEGATIVE
Protein, ur: 100 mg/dL — AB
Specific Gravity, Urine: 1.017 (ref 1.005–1.030)
pH: 5 (ref 5.0–8.0)

## 2020-10-18 NOTE — Progress Notes (Signed)
Received a call from treatment staff stating patient complaining of blood in his urine.  He is currently undergoing radiation treatment to his rectum.  He is receiving his boost, on number 2/5.  We will obtain UA/Culture.  He was sent to the lab following his radiation treatment.  Patient understands we will call him with the results.    Gloriajean Dell. Leonie Green, BSN

## 2020-10-19 ENCOUNTER — Ambulatory Visit
Admission: RE | Admit: 2020-10-19 | Discharge: 2020-10-19 | Disposition: A | Discharge status: Home / Self Care | Payer: Medicare Other | Source: Ambulatory Visit | Attending: Radiation Oncology | Admitting: Radiation Oncology

## 2020-10-19 ENCOUNTER — Other Ambulatory Visit (HOSPITAL_COMMUNITY): Payer: Self-pay

## 2020-10-19 ENCOUNTER — Ambulatory Visit: Payer: Medicare Other

## 2020-10-19 ENCOUNTER — Telehealth: Payer: Self-pay | Admitting: *Deleted

## 2020-10-19 ENCOUNTER — Other Ambulatory Visit: Payer: Self-pay

## 2020-10-19 ENCOUNTER — Telehealth: Payer: Self-pay

## 2020-10-19 DIAGNOSIS — Z510 Encounter for antineoplastic radiation therapy: Secondary | ICD-10-CM | POA: Diagnosis not present

## 2020-10-19 LAB — URINE CULTURE: Culture: NO GROWTH

## 2020-10-19 NOTE — Telephone Encounter (Signed)
[place] and myself have attempted to reach patient multiple times to set up refill of Xeloda.  Patient does not answer and does not have voicemail.  [facility] Patient [facility] Phone [phone] Fax [phone] 10/19/2020 12:51 PM

## 2020-10-19 NOTE — Telephone Encounter (Signed)
Spoke with the patient to let him know his urinalysis and culture came back within normal limits.  He was advised to use over the counter AZO.  Patient verbalized understanding.  Gloriajean Dell. Leonie Green, BSN

## 2020-10-20 ENCOUNTER — Ambulatory Visit
Admission: RE | Admit: 2020-10-20 | Discharge: 2020-10-20 | Disposition: A | Discharge status: Home / Self Care | Payer: Medicare Other | Source: Ambulatory Visit | Attending: Radiation Oncology | Admitting: Radiation Oncology

## 2020-10-20 ENCOUNTER — Other Ambulatory Visit: Payer: Self-pay | Admitting: *Deleted

## 2020-10-20 ENCOUNTER — Other Ambulatory Visit (HOSPITAL_COMMUNITY): Payer: Self-pay

## 2020-10-20 ENCOUNTER — Telehealth: Payer: Self-pay | Admitting: Adult Health

## 2020-10-20 ENCOUNTER — Telehealth: Payer: Self-pay | Admitting: *Deleted

## 2020-10-20 DIAGNOSIS — Z510 Encounter for antineoplastic radiation therapy: Secondary | ICD-10-CM | POA: Diagnosis not present

## 2020-10-20 DIAGNOSIS — C20 Malignant neoplasm of rectum: Secondary | ICD-10-CM

## 2020-10-20 NOTE — Telephone Encounter (Signed)
Left message for patient to call back and schedule Medicare Annual Wellness Visit (AWV) either virtually or in office.   Last AWV 02/18/17  please schedule at anytime with [facility] Nurse Health Advisor 1 or 2   This should be a 45 minute visit.

## 2020-10-20 NOTE — Telephone Encounter (Signed)
Unable to reach patient re: Xeloda refill and he does not have voice mail. Called his daughter, [name] and provided phone # for [place] to arrange for his refill.

## 2020-10-21 ENCOUNTER — Inpatient Hospital Stay: Payer: Medicare Other

## 2020-10-21 ENCOUNTER — Encounter: Payer: Self-pay | Admitting: Nurse Practitioner

## 2020-10-21 ENCOUNTER — Ambulatory Visit
Admission: RE | Admit: 2020-10-21 | Discharge: 2020-10-21 | Disposition: A | Discharge status: Home / Self Care | Payer: Medicare Other | Source: Ambulatory Visit | Attending: Radiation Oncology | Admitting: Radiation Oncology

## 2020-10-21 ENCOUNTER — Other Ambulatory Visit: Payer: Self-pay

## 2020-10-21 ENCOUNTER — Inpatient Hospital Stay (HOSPITAL_BASED_OUTPATIENT_CLINIC_OR_DEPARTMENT_OTHER): Payer: Medicare Other | Admitting: Nurse Practitioner

## 2020-10-21 ENCOUNTER — Encounter: Payer: Self-pay | Admitting: Radiation Oncology

## 2020-10-21 VITALS — BP 129/52 | HR 75 | Temp 97.8°F | Resp 18 | Ht 72.0 in | Wt 170.6 lb

## 2020-10-21 DIAGNOSIS — I6310 Cerebral infarction due to embolism of unspecified precerebral artery: Secondary | ICD-10-CM

## 2020-10-21 DIAGNOSIS — C20 Malignant neoplasm of rectum: Secondary | ICD-10-CM

## 2020-10-21 DIAGNOSIS — Z510 Encounter for antineoplastic radiation therapy: Secondary | ICD-10-CM | POA: Diagnosis not present

## 2020-10-21 LAB — CBC WITH DIFFERENTIAL (CANCER CENTER ONLY)
Abs Immature Granulocytes: 0.01 10*3/uL (ref 0.00–0.07)
Basophils Absolute: 0 10*3/uL (ref 0.0–0.1)
Basophils Relative: 0 %
Eosinophils Absolute: 0.3 10*3/uL (ref 0.0–0.5)
Eosinophils Relative: 7 %
HCT: 27.9 % — ABNORMAL LOW (ref 39.0–52.0)
Hemoglobin: 9 g/dL — ABNORMAL LOW (ref 13.0–17.0)
Immature Granulocytes: 0 %
Lymphocytes Relative: 5 %
Lymphs Abs: 0.3 10*3/uL — ABNORMAL LOW (ref 0.7–4.0)
MCH: 26.7 pg (ref 26.0–34.0)
MCHC: 32.3 g/dL (ref 30.0–36.0)
MCV: 82.8 fL (ref 80.0–100.0)
Monocytes Absolute: 0.5 10*3/uL (ref 0.1–1.0)
Monocytes Relative: 10 %
Neutro Abs: 3.7 10*3/uL (ref 1.7–7.7)
Neutrophils Relative %: 78 %
Platelet Count: 192 10*3/uL (ref 150–400)
RBC: 3.37 MIL/uL — ABNORMAL LOW (ref 4.22–5.81)
RDW: 25.2 % — ABNORMAL HIGH (ref 11.5–15.5)
WBC Count: 4.8 10*3/uL (ref 4.0–10.5)
nRBC: 0 % (ref 0.0–0.2)

## 2020-10-21 LAB — CMP (CANCER CENTER ONLY)
ALT: 11 U/L (ref 0–44)
AST: 35 U/L (ref 15–41)
Albumin: 4.1 g/dL (ref 3.5–5.0)
Alkaline Phosphatase: 51 U/L (ref 38–126)
Anion gap: 10 (ref 5–15)
BUN: 31 mg/dL — ABNORMAL HIGH (ref 8–23)
CO2: 23 mmol/L (ref 22–32)
Calcium: 8.8 mg/dL — ABNORMAL LOW (ref 8.9–10.3)
Chloride: 107 mmol/L (ref 98–111)
Creatinine: 2.05 mg/dL — ABNORMAL HIGH (ref 0.61–1.24)
GFR, Estimated: 31 mL/min — ABNORMAL LOW (ref >60)
Glucose, Bld: 106 mg/dL — ABNORMAL HIGH (ref 70–99)
Potassium: 4.3 mmol/L (ref 3.5–5.1)
Sodium: 140 mmol/L (ref 135–145)
Total Bilirubin: 0.4 mg/dL (ref 0.3–1.2)
Total Protein: 7.5 g/dL (ref 6.5–8.1)

## 2020-10-21 NOTE — Progress Notes (Addendum)
  [facility] OFFICE PROGRESS NOTE   Diagnosis: Rectal cancer  INTERVAL HISTORY:   Mr. Karbowski returns as scheduled.  He completed the final radiation treatment today.  He will take the final dose of Xeloda this evening.  He denies nausea/vomiting.  No mouth sores.  No diarrhea.  No hand or foot pain or redness.  He continues to have soreness at the rectum.  Objective:  Vital signs in last 24 hours:  Blood pressure (!) 129/52, pulse 75, temperature 97.8 F (36.6 C), temperature source Oral, resp. rate 18, height 6' (1.829 m), weight 170 lb 9.6 oz (77.4 kg), SpO2 99 %.    HEENT: No thrush or ulcers. Resp: Lungs clear bilaterally. Cardio: Regular rate and rhythm. GI: No hepatomegaly. Vascular: No leg edema.  Skin: Palms with mild erythema.  Superficial ulceration involving the perianal skin.   Lab Results:  Lab Results  Component Value Date   WBC 4.8 10/21/2020   HGB 9.0 (L) 10/21/2020   HCT 27.9 (L) 10/21/2020   MCV 82.8 10/21/2020   PLT 192 10/21/2020   NEUTROABS 3.7 10/21/2020    Imaging:  No results found.  Medications: I have reviewed the patient's current medications.  Assessment/Plan: 1. Rectal cancer ? Colonoscopy 08/03/2020-distal rectal mass abutting the anus, biopsy confirmed adenocarcinoma ? CT angiography 07/29/2020-hazy soft tissue at the porta hepatis encasing the right hepatic artery, exophytic soft tissue lesion at the upper pole of the left kidney-indeterminate, partially solid nodule in the left lower lobe-present on a CT in 2013 suspicious, suspicious for a low-grade pulmonary neoplasm, enlarged prostate, ? MRI abdomen 07/30/2020-no evidence of mass in the porta hepatis, hypoenhancing lesions in the upper pole of the left kidney and interpolar region of the right kidney suspicious for small neoplasms, hemosiderosis of the liver and spleen ? MRI pelvis 08/17/2020-T2, N0; minimal if any distance from tumor to internal anal  sphincter ? Radiation and capecitabine 09/12/2020-10/21/2020 2. Enlarged prostate with an elevated PSA 3. 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 Hypertension 5. Gout 6. History of coronary artery disease 7. Iron deficiency anemia-Feraheme 07/30/2020 8. B12 deficiency-07/31/2020 B12 161, on oral B12   Disposition: Mr. Herda appears stable.  He will complete the course of radiation/capecitabine today.  He has superficial ulceration at the perianal region.  He will continue supportive care.  This should hopefully heal in the next few weeks.  We reviewed the CBC and chemistry panel from today.  No significant change from baseline.  He will return for lab and follow-up in 4 to 5 weeks.  We are available to see him sooner if needed.  Patient seen with Dr. Benay Spice.    Ned Card ANP/GNP-BC   10/21/2020  1:46 PM  This was a shared visit with Ned Card.  Mr. Taussig has completed the course of Xeloda and radiation.  The skin changes at the perineum should improve over the next few weeks.  He stated again today that he does not wish to consider surgery.  We will consider systemic treatment options if he has disease progression following the Xeloda/radiation.  We will restage the rectum within the next 1-2 months.  I was present for greater than 50% of today's visit.  I performed medical decision making.   Julieanne Manson, MD

## 2020-10-22 ENCOUNTER — Other Ambulatory Visit (HOSPITAL_COMMUNITY): Payer: Self-pay

## 2020-10-24 NOTE — Progress Notes (Signed)
  Patient Name: Isaac Phelps MRN: [id number] DOB: December 18, 1930 Referring Physician: Dorothyann Peng (Profile Not Attached) Date of Service: 10/21/2020 [facility], [place]                                                        End Of Treatment Note  Diagnoses: C20-Malignant neoplasm of rectum  Cancer Staging:    Stage I, cT2N0M0 adenocarcinoma of the rectum  Intent: Curative  Radiation Treatment Dates: 09/12/2020 through 10/21/2020 Site Technique Total Dose (Gy) Dose per Fx (Gy) Completed Fx Beam Energies  Rectum: Rectum 3D 45/45 1.8 25/25 10X, 15X  Rectum: Rectum_Bst 3D 9/9 1.8 5/5 10X, 15X   Narrative: The patient tolerated radiation therapy relatively well. He did have tenderness with having bowel movements. His skin was somewhat irritated as well but he did not have any rectal bleeding at the completion of treatment. He had dysuria and frequency but urine testing was negative for infection and he used OTC Azo for his symptoms.   Plan: The patient will receive a call in about one month from the radiation oncology department. He will continue follow up with Dr. Benay Spice as well. He has previously declined surgery but I'll copy his notes to Dr. Dema Severin in case that is to be revisited.  ________________________________________________    Carola Rhine, PAC

## 2020-11-01 ENCOUNTER — Other Ambulatory Visit (HOSPITAL_COMMUNITY): Payer: Self-pay

## 2020-11-18 ENCOUNTER — Other Ambulatory Visit: Payer: Self-pay

## 2020-11-18 ENCOUNTER — Inpatient Hospital Stay: Payer: Medicare Other

## 2020-11-18 ENCOUNTER — Inpatient Hospital Stay: Payer: Medicare Other | Attending: Radiation Oncology | Admitting: Oncology

## 2020-11-18 VITALS — BP 146/67 | HR 70 | Temp 98.3°F | Resp 19 | Ht 72.0 in | Wt 175.0 lb

## 2020-11-18 DIAGNOSIS — I10 Essential (primary) hypertension: Secondary | ICD-10-CM | POA: Insufficient documentation

## 2020-11-18 DIAGNOSIS — E538 Deficiency of other specified B group vitamins: Secondary | ICD-10-CM

## 2020-11-18 DIAGNOSIS — Z923 Personal history of irradiation: Secondary | ICD-10-CM | POA: Insufficient documentation

## 2020-11-18 DIAGNOSIS — D509 Iron deficiency anemia, unspecified: Secondary | ICD-10-CM | POA: Diagnosis not present

## 2020-11-18 DIAGNOSIS — Z79899 Other long term (current) drug therapy: Secondary | ICD-10-CM | POA: Insufficient documentation

## 2020-11-18 DIAGNOSIS — N400 Enlarged prostate without lower urinary tract symptoms: Secondary | ICD-10-CM | POA: Insufficient documentation

## 2020-11-18 DIAGNOSIS — C20 Malignant neoplasm of rectum: Secondary | ICD-10-CM

## 2020-11-18 LAB — CBC WITH DIFFERENTIAL (CANCER CENTER ONLY)
Abs Immature Granulocytes: 0.04 10*3/uL (ref 0.00–0.07)
Basophils Absolute: 0 10*3/uL (ref 0.0–0.1)
Basophils Relative: 1 %
Eosinophils Absolute: 0.2 10*3/uL (ref 0.0–0.5)
Eosinophils Relative: 3 %
HCT: 28.6 % — ABNORMAL LOW (ref 39.0–52.0)
Hemoglobin: 9.1 g/dL — ABNORMAL LOW (ref 13.0–17.0)
Immature Granulocytes: 1 %
Lymphocytes Relative: 5 %
Lymphs Abs: 0.3 10*3/uL — ABNORMAL LOW (ref 0.7–4.0)
MCH: 27.3 pg (ref 26.0–34.0)
MCHC: 31.8 g/dL (ref 30.0–36.0)
MCV: 85.9 fL (ref 80.0–100.0)
Monocytes Absolute: 0.6 10*3/uL (ref 0.1–1.0)
Monocytes Relative: 10 %
Neutro Abs: 4.7 10*3/uL (ref 1.7–7.7)
Neutrophils Relative %: 80 %
Platelet Count: 234 10*3/uL (ref 150–400)
RBC: 3.33 MIL/uL — ABNORMAL LOW (ref 4.22–5.81)
RDW: 20.3 % — ABNORMAL HIGH (ref 11.5–15.5)
WBC Count: 5.7 10*3/uL (ref 4.0–10.5)
nRBC: 0 % (ref 0.0–0.2)

## 2020-11-18 LAB — FERRITIN: Ferritin: 27 ng/mL (ref 24–336)

## 2020-11-18 LAB — CEA (ACCESS): CEA (CHCC): 5.3 ng/mL — ABNORMAL HIGH (ref 0.00–5.00)

## 2020-11-18 NOTE — Progress Notes (Signed)
  [place] OFFICE PROGRESS NOTE   Diagnosis: Rectal cancer  INTERVAL HISTORY:   [name] returns as scheduled.  He reports the perineal skin breakdown has improved.  No difficulty with bowel function.  No bleeding.  No specific complaint.  Objective:  Vital signs in last 24 hours:  Blood pressure (!) 146/67, pulse 70, temperature 98.3 F (36.8 C), temperature source Oral, resp. rate 19, height 6' (1.829 m), weight 175 lb (79.4 kg), SpO2 99 %.    Resp: Lungs clear bilaterally Cardio: Regular rate and rhythm GI: No hepatosplenomegaly Vascular: No leg edema Rectal: Radiation hyperpigmentation at the perineum and gluteal fold, no skin breakdown.  Normal rectal tone, no palpable mass   Lab Results:  Lab Results  Component Value Date   WBC 5.7 11/18/2020   HGB 9.1 (L) 11/18/2020   HCT 28.6 (L) 11/18/2020   MCV 85.9 11/18/2020   PLT 234 11/18/2020   NEUTROABS 4.7 11/18/2020    CMP  Lab Results  Component Value Date   NA 140 10/21/2020   K 4.3 10/21/2020   CL 107 10/21/2020   CO2 23 10/21/2020   GLUCOSE 106 (H) 10/21/2020   BUN 31 (H) 10/21/2020   CREATININE 2.05 (H) 10/21/2020   CALCIUM 8.8 (L) 10/21/2020   PROT 7.5 10/21/2020   ALBUMIN 4.1 10/21/2020   AST 35 10/21/2020   ALT 11 10/21/2020   ALKPHOS 51 10/21/2020   BILITOT 0.4 10/21/2020   GFRNONAA 31 (L) 10/21/2020   GFRAA 42 (L) 02/14/2015    Lab Results  Component Value Date   CEA1 4.07 08/24/2020    Medications: I have reviewed the patient's current medications.   Assessment/Plan: Rectal cancer Colonoscopy 08/03/2020-distal rectal mass abutting the anus, biopsy confirmed adenocarcinoma CT angiography 07/29/2020-hazy soft tissue at the porta hepatis encasing the right hepatic artery, exophytic soft tissue lesion at the upper pole of the left kidney-indeterminate, partially solid nodule in the left lower lobe-present on a CT in 2013 suspicious, suspicious for a low-grade pulmonary  neoplasm, enlarged prostate, MRI abdomen 07/30/2020-no evidence of mass in the porta hepatis, hypoenhancing lesions in the upper pole of the left kidney and interpolar region of the right kidney suspicious for small neoplasms, hemosiderosis of the liver and spleen MRI pelvis 08/17/2020-T2, N0; minimal if any distance from tumor to internal anal sphincter Radiation and capecitabine 09/12/2020-10/21/2020 Enlarged prostate with an elevated PSA 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  There is no palpable rectal mass on exam today.  I will refer him to Dr. Carlean Purl for a restaging sigmoidoscopy.  [name] indicated again today that he will not agree to surgery.  I explained the small chance of a complete clinical response to Xeloda and radiation.  We can consider additional systemic treatment options, but they will not be curative.  He will return for an office visit and further discussion after the sigmoidoscopy.  He continues to have anemia.  He was noted to have iron deficiency anemia and a low vitamin B12 level earlier this year.  We will check a ferritin and vitamin B12 when he returns next month.  Betsy Coder, MD  11/18/2020  11:07 AM

## 2020-11-22 ENCOUNTER — Telehealth: Payer: Self-pay

## 2020-11-22 LAB — CEA (IN HOUSE-CHCC): CEA (CHCC-In House): 5.01 ng/mL — ABNORMAL HIGH (ref 0.00–5.00)

## 2020-11-22 NOTE — Telephone Encounter (Signed)
-----   Message from Ladell Pier, MD sent at 11/18/2020  4:09 PM EDT ----- Please call patient, the iron level is still low, be sure he is taking ferrous sulfate twice daily, plan to repeat a CBC and iron studies when he returns next month

## 2020-11-22 NOTE — Telephone Encounter (Signed)
Pt called x 2 no answer message laft no return call received this nurse make 1 more call no answer message left making aware of most recent iron results and instruction to take iron BID encouraged to call for any questions concerns or changes

## 2020-11-24 ENCOUNTER — Telehealth: Payer: Self-pay

## 2020-11-24 NOTE — Telephone Encounter (Signed)
-----   Message from Gatha Mayer, MD sent at 11/24/2020 12:10 PM EDT ----- Regarding: needs fex sig Sure Ayesha Mohair - please put him into one of my slots - I have had some cancellations - if there are not any open any 730 or a 430 are ok with me as an add on - given his age a 88 might work better ----- Message ----- From: Ladell Pier, MD Sent: 11/18/2020  11:24 AM EDT To: Gatha Mayer, MD  He completed neoadjuvant xeloda and radiation.  He is refusing surgery.  I cannot palpate a mass on rectal exam.  Can you get him in for a restaging sigmoidoscopy next 3 weeks?  Thanks  [employer]

## 2020-11-24 NOTE — Telephone Encounter (Signed)
Pt scheduled for previsit 11/29/20 at 4:30pm. Flex sig scheduled in the [place] 12/08/20@9 :30am. Left message for pt to call back.

## 2020-11-25 NOTE — Telephone Encounter (Signed)
Left message for pt to call back regarding appt. 

## 2020-11-28 ENCOUNTER — Other Ambulatory Visit: Payer: Self-pay

## 2020-11-28 ENCOUNTER — Ambulatory Visit (INDEPENDENT_AMBULATORY_CARE_PROVIDER_SITE_OTHER): Payer: Medicare Other

## 2020-11-28 VITALS — BP 155/68 | HR 63 | Temp 98.4°F | Ht 72.0 in | Wt 175.0 lb

## 2020-11-28 DIAGNOSIS — Z0000 Encounter for general adult medical examination without abnormal findings: Secondary | ICD-10-CM | POA: Diagnosis not present

## 2020-11-28 NOTE — Progress Notes (Signed)
Subjective:   Isaac Phelps is a 87 y.o. male who presents for an Initial Medicare Annual Wellness Visit.  Review of Systems    N/a       Objective:    There were no vitals filed for this visit. There is no height or weight on file to calculate BMI.  Advanced Directives 09/26/2020 09/06/2020 08/11/2020 07/31/2020 02/18/2017 02/09/2015 02/09/2015  Does Patient Have a Medical Advance Directive? No Yes No No No No No  Type of Advance Directive - (No Data) - - - - -  Does patient want to make changes to medical advance directive? No - Patient declined No - Patient declined - - - - -  Would patient like information on creating a medical advance directive? - No - Patient declined No - Patient declined Yes (Inpatient - patient defers creating a medical advance directive at this time - Information given) - Yes - Educational materials given No - patient declined information    Current Medications (verified) Outpatient Encounter Medications as of 11/28/2020  Medication Sig   acetaminophen (TYLENOL) 650 MG CR tablet Take 650 mg by mouth daily as needed for pain.   amLODipine (NORVASC) 10 MG tablet Take 1 tablet (10 mg total) by mouth daily.   capecitabine (XELODA) 500 MG tablet TAKE 3 TABS (1500 MG) BY MOUTH IN AM, 2 TABS (1000 MG) IN PM, (TOTAL DOSE 2500 MG DAILY). TAKE WITHIN 30 MINUTES AFTER MEALS. TAKE ON DAYS OF RADIATION ONLY (Patient not taking: Reported on 11/18/2020)   carvedilol (COREG) 3.125 MG tablet Take 1 tablet (3.125 mg total) by mouth 2 (two) times daily with a meal.   ferrous sulfate 325 (65 FE) MG EC tablet Take 325 mg by mouth 2 (two) times daily with a meal.   hydrALAZINE (APRESOLINE) 50 MG tablet Take 1 tablet (50 mg total) by mouth every 8 (eight) hours.   polyvinyl alcohol (LIQUIFILM TEARS) 1.4 % ophthalmic solution Place 1 drop into both eyes daily as needed for dry eyes.   simvastatin (ZOCOR) 40 MG tablet Take 1 tablet (40 mg total) by mouth daily.   tamsulosin (FLOMAX) 0.4 MG  CAPS capsule Take 1 capsule (0.4 mg total) by mouth daily.   No facility-administered encounter medications on file as of 11/28/2020.    Allergies (verified) Patient has no known allergies.   History: Past Medical History:  Diagnosis Date   Adenocarcinoma of rectum ([facility])    Anxiety    BENIGN PROSTATIC HYPERTROPHY 04/01/2008   Coronary artery disease    GERD (gastroesophageal reflux disease)    HYPERLIPIDEMIA 05/26/2007   HYPERTENSION 02/07/2007   Syncope and collapse    TOBACCO ABUSE 04/01/2008   Past Surgical History:  Procedure Laterality Date   APPENDECTOMY     BIOPSY  08/03/2020   Procedure: BIOPSY;  Surgeon: Gatha Mayer, MD;  Location: [facility];  Service: Endoscopy;;   COLONOSCOPY WITH PROPOFOL N/A 08/03/2020   Procedure: COLONOSCOPY WITH PROPOFOL;  Surgeon: Gatha Mayer, MD;  Location: [facility] ENDOSCOPY;  Service: Endoscopy;  Laterality: N/A;   ESOPHAGOGASTRODUODENOSCOPY (EGD) WITH PROPOFOL N/A 08/03/2020   Procedure: ESOPHAGOGASTRODUODENOSCOPY (EGD) WITH PROPOFOL;  Surgeon: Gatha Mayer, MD;  Location: [place];  Service: Endoscopy;  Laterality: N/A;   TONSILLECTOMY     Family History  Problem Relation Age of Onset   Arthritis Neg Hx        family   Hypertension Neg Hx        family   Stroke Neg  Hx        family , 1st degree relative   Social History   Socioeconomic History   Marital status: Married    Spouse name: Not on file   Number of children: Not on file   Years of education: Not on file   Highest education level: Not on file  Occupational History   Not on file  Tobacco Use   Smoking status: Former    Packs/day: 0.30    Pack years: 0.00    Types: Cigarettes   Smokeless tobacco: Never  Vaping Use   Vaping Use: Never used  Substance and Sexual Activity   Alcohol use: No   Drug use: No   Sexual activity: Not on file  Other Topics Concern   Not on file  Social History Narrative   Not on file   Social Determinants of Health   Financial  Resource Strain: Not on file  Food Insecurity: Not on file  Transportation Needs: Not on file  Physical Activity: Not on file  Stress: Not on file  Social Connections: Not on file    Tobacco Counseling Counseling given: Not Answered   Clinical Intake:                 Diabetic?no         Activities of Daily Living In your present state of health, do you have any difficulty performing the following activities: 07/30/2020 07/30/2020  Hearing? - N  Vision? - N  Difficulty concentrating or making decisions? - N  Walking or climbing stairs? - N  Dressing or bathing? - N  Doing errands, shopping? N -  Some recent data might be hidden    Patient Care Team: Dorothyann Peng, NP as PCP - General (Family Medicine) Ladell Pier, MD as Consulting Physician (Oncology)  Indicate any recent Medical Services you may have received from other than [facility] providers in the past year (date may be approximate).     Assessment:   This is a routine wellness examination for Isaac Phelps.  Hearing/Vision screen No results found.  Dietary issues and exercise activities discussed:     Goals Addressed   None    Depression Screen PHQ 2/9 Scores 04/15/2018 08/12/2015 05/25/2014 05/25/2013 05/25/2013 05/25/2013  PHQ - 2 Score 0 0 0 0 0 0    Fall Risk Fall Risk  04/23/2018 04/15/2018 08/12/2015 05/25/2014 05/25/2013  Falls in the past year? 0 0 No No No  Comment Emmi Telephone Survey: data to providers prior to load - - - -  Number falls in past yr: - 0 - - -    FALL RISK PREVENTION PERTAINING TO THE HOME:  Any stairs in or around the home? No  If so, are there any without handrails? No  Home free of loose throw rugs in walkways, pet beds, electrical cords, etc? Yes  Adequate lighting in your home to reduce risk of falls? Yes   ASSISTIVE DEVICES UTILIZED TO PREVENT FALLS:  Life alert? No  Use of a cane, walker or w/c? Yes  Grab bars in the bathroom? Yes  Shower chair or bench  in shower? Yes  Elevated toilet seat or a handicapped toilet? No   TIMED UP AND GO:  Was the test performed? Yes .  Length of time to ambulate 10 feet: 10 sec.   Gait slow and steady with assistive device  Cognitive Function:      Normal cognitive status assessed by direct observation by this Nurse Health Advisor.  No abnormalities found.    Immunizations Immunization History  Administered Date(s) Administered   Influenza Split 04/05/2011, 03/12/2012   Influenza Whole 03/04/2006, 01/23/2010   Influenza, High Dose Seasonal PF 03/03/2013, 02/15/2015   Influenza-Unspecified 04/18/2017   PFIZER(Purple Top)SARS-COV-2 Vaccination 09/28/2019, 10/19/2019, 05/04/2020   Pneumococcal Conjugate-13 03/03/2015   Pneumococcal Polysaccharide-23 04/18/2017   Td 03/04/2006   Tdap 03/03/2015    TDAP status: Up to date  Flu Vaccine status: Up to date  Pneumococcal vaccine status: Up to date  Covid-19 vaccine status: Completed vaccines  Qualifies for Shingles Vaccine? Yes   Zostavax completed No   Shingrix Completed?: No.    Education has been provided regarding the importance of this vaccine. Patient has been advised to call insurance company to determine out of pocket expense if they have not yet received this vaccine. Advised may also receive vaccine at local pharmacy or Health Dept. Verbalized acceptance and understanding.  Screening Tests Health Maintenance  Topic Date Due   Zoster Vaccines- Shingrix (1 of 2) Never done   COVID-19 Vaccine (4 - Booster for Pfizer series) 08/02/2020   INFLUENZA VACCINE  01/02/2021   TETANUS/TDAP  03/02/2025   PNA vac Low Risk Adult  Completed   HPV VACCINES  Aged Out    Health Maintenance  Health Maintenance Due  Topic Date Due   Zoster Vaccines- Shingrix (1 of 2) Never done   COVID-19 Vaccine (4 - Booster for Pfizer series) 08/02/2020    Colorectal cancer screening: No longer required.   Lung Cancer Screening: (Low Dose CT Chest  recommended if Age 3-80 years, 30 pack-year currently smoking OR have quit w/in 15years.) does not qualify.   Lung Cancer Screening Referral: n/a  Additional Screening:  Hepatitis C Screening: does not qualify  Vision Screening: Recommended annual ophthalmology exams for early detection of glaucoma and other disorders of the eye. Is the patient up to date with their annual eye exam?  Yes  Who is the provider or what is the name of the office in which the patient attends annual eye exams? Dr.Grote If pt is not established with a provider, would they like to be referred to a provider to establish care? No .   Dental Screening: Recommended annual dental exams for proper oral hygiene  Community Resource Referral / Chronic Care Management: CRR required this visit?  No   CCM required this visit?  No      Plan:     I have personally reviewed and noted the following in the patient's chart:   Medical and social history Use of alcohol, tobacco or illicit drugs  Current medications and supplements including opioid prescriptions. Patient is not currently taking opioid prescriptions. Functional ability and status Nutritional status Physical activity Advanced directives List of other physicians Hospitalizations, surgeries, and ER visits in previous 12 months Vitals Screenings to include cognitive, depression, and falls Referrals and appointments  In addition, I have reviewed and discussed with patient certain preventive protocols, quality metrics, and best practice recommendations. A written personalized care plan for preventive services as well as general preventive health recommendations were provided to patient.     Randel Pigg, LPN   10/19/6158   Nurse Notes: none

## 2020-11-28 NOTE — Telephone Encounter (Signed)
Sent a Therapist, music.  Attempted to call and rings 3 times then disconnects

## 2020-11-28 NOTE — Patient Instructions (Signed)
[name] , Thank you for taking time to come for your Medicare Wellness Visit. I appreciate your ongoing commitment to your health goals. Please review the following plan we discussed and let me know if I can assist you in the future.   Screening recommendations/referrals: Colonoscopy: no longer required  Recommended yearly ophthalmology/optometry visit for glaucoma screening and checkup Recommended yearly dental visit for hygiene and checkup  Vaccinations: Influenza vaccine: due in fall 2022 Pneumococcal vaccine: completed series  Tdap vaccine: current due 02/27/2025 Shingles vaccine: pt declines   Advanced directives: will provide copies   Conditions/risks identified: none   Next appointment: none   Preventive Care 47 Years and Older, Male Preventive care refers to lifestyle choices and visits with your health care provider that can promote health and wellness. What does preventive care include? A yearly physical exam. This is also called an annual well check. Dental exams once or twice a year. Routine eye exams. Ask your health care provider how often you should have your eyes checked. Personal lifestyle choices, including: Daily care of your teeth and gums. Regular physical activity. Eating a healthy diet. Avoiding tobacco and drug use. Limiting alcohol use. Practicing safe sex. Taking low doses of aspirin every day. Taking vitamin and mineral supplements as recommended by your health care provider. What happens during an annual well check? The services and screenings done by your health care provider during your annual well check will depend on your age, overall health, lifestyle risk factors, and family history of disease. Counseling  Your health care provider may ask you questions about your: Alcohol use. Tobacco use. Drug use. Emotional well-being. Home and relationship well-being. Sexual activity. Eating habits. History of falls. Memory and ability to  understand (cognition). Work and work Statistician. Screening  You may have the following tests or measurements: Height, weight, and BMI. Blood pressure. Lipid and cholesterol levels. These may be checked every 5 years, or more frequently if you are over 84 years old. Skin check. Lung cancer screening. You may have this screening every year starting at age 62 if you have a 30-pack-year history of smoking and currently smoke or have quit within the past 15 years. Fecal occult blood test (FOBT) of the stool. You may have this test every year starting at age 63. Flexible sigmoidoscopy or colonoscopy. You may have a sigmoidoscopy every 5 years or a colonoscopy every 10 years starting at age 48. Prostate cancer screening. Recommendations will vary depending on your family history and other risks. Hepatitis C blood test. Hepatitis B blood test. Sexually transmitted disease (STD) testing. Diabetes screening. This is done by checking your blood sugar (glucose) after you have not eaten for a while (fasting). You may have this done every 1-3 years. Abdominal aortic aneurysm (AAA) screening. You may need this if you are a current or former smoker. Osteoporosis. You may be screened starting at age 21 if you are at high risk. Talk with your health care provider about your test results, treatment options, and if necessary, the need for more tests. Vaccines  Your health care provider may recommend certain vaccines, such as: Influenza vaccine. This is recommended every year. Tetanus, diphtheria, and acellular pertussis (Tdap, Td) vaccine. You may need a Td booster every 10 years. Zoster vaccine. You may need this after age 38. Pneumococcal 13-valent conjugate (PCV13) vaccine. One dose is recommended after age 25. Pneumococcal polysaccharide (PPSV23) vaccine. One dose is recommended after age 76. Talk to your health care provider about which screenings  and vaccines you need and how often you need them. This  information is not intended to replace advice given to you by your health care provider. Make sure you discuss any questions you have with your health care provider. Document Released: 06/17/2015 Document Revised: 02/08/2016 Document Reviewed: 03/22/2015 Elsevier Interactive Patient Education  2017 [place] Prevention in the Home Falls can cause injuries. They can happen to people of all ages. There are many things you can do to make your home safe and to help prevent falls. What can I do on the outside of my home? Regularly fix the edges of walkways and driveways and fix any cracks. Remove anything that might make you trip as you walk through a door, such as a raised step or threshold. Trim any bushes or trees on the path to your home. Use bright outdoor lighting. Clear any walking paths of anything that might make someone trip, such as rocks or tools. Regularly check to see if handrails are loose or broken. Make sure that both sides of any steps have handrails. Any raised decks and porches should have guardrails on the edges. Have any leaves, snow, or ice cleared regularly. Use sand or salt on walking paths during winter. Clean up any spills in your garage right away. This includes oil or grease spills. What can I do in the bathroom? Use night lights. Install grab bars by the toilet and in the tub and shower. Do not use towel bars as grab bars. Use non-skid mats or decals in the tub or shower. If you need to sit down in the shower, use a plastic, non-slip stool. Keep the floor dry. Clean up any water that spills on the floor as soon as it happens. Remove soap buildup in the tub or shower regularly. Attach bath mats securely with double-sided non-slip rug tape. Do not have throw rugs and other things on the floor that can make you trip. What can I do in the bedroom? Use night lights. Make sure that you have a light by your bed that is easy to reach. Do not use any sheets or  blankets that are too big for your bed. They should not hang down onto the floor. Have a firm chair that has side arms. You can use this for support while you get dressed. Do not have throw rugs and other things on the floor that can make you trip. What can I do in the kitchen? Clean up any spills right away. Avoid walking on wet floors. Keep items that you use a lot in easy-to-reach places. If you need to reach something above you, use a strong step stool that has a grab bar. Keep electrical cords out of the way. Do not use floor polish or wax that makes floors slippery. If you must use wax, use non-skid floor wax. Do not have throw rugs and other things on the floor that can make you trip. What can I do with my stairs? Do not leave any items on the stairs. Make sure that there are handrails on both sides of the stairs and use them. Fix handrails that are broken or loose. Make sure that handrails are as long as the stairways. Check any carpeting to make sure that it is firmly attached to the stairs. Fix any carpet that is loose or worn. Avoid having throw rugs at the top or bottom of the stairs. If you do have throw rugs, attach them to the floor with carpet tape.  Make sure that you have a light switch at the top of the stairs and the bottom of the stairs. If you do not have them, ask someone to add them for you. What else can I do to help prevent falls? Wear shoes that: Do not have high heels. Have rubber bottoms. Are comfortable and fit you well. Are closed at the toe. Do not wear sandals. If you use a stepladder: Make sure that it is fully opened. Do not climb a closed stepladder. Make sure that both sides of the stepladder are locked into place. Ask someone to hold it for you, if possible. Clearly mark and make sure that you can see: Any grab bars or handrails. First and last steps. Where the edge of each step is. Use tools that help you move around (mobility aids) if they are  needed. These include: Canes. Walkers. Scooters. Crutches. Turn on the lights when you go into a dark area. Replace any light bulbs as soon as they burn out. Set up your furniture so you have a clear path. Avoid moving your furniture around. If any of your floors are uneven, fix them. If there are any pets around you, be aware of where they are. Review your medicines with your doctor. Some medicines can make you feel dizzy. This can increase your chance of falling. Ask your doctor what other things that you can do to help prevent falls. This information is not intended to replace advice given to you by your health care provider. Make sure you discuss any questions you have with your health care provider. Document Released: 03/17/2009 Document Revised: 10/27/2015 Document Reviewed: 06/25/2014 Elsevier Interactive Patient Education  2017 [employer].

## 2020-11-29 ENCOUNTER — Ambulatory Visit (AMBULATORY_SURGERY_CENTER): Payer: Medicare Other

## 2020-11-29 ENCOUNTER — Other Ambulatory Visit: Payer: Self-pay

## 2020-11-29 VITALS — Ht 72.0 in | Wt 178.0 lb

## 2020-11-29 DIAGNOSIS — C20 Malignant neoplasm of rectum: Secondary | ICD-10-CM

## 2020-11-29 NOTE — Telephone Encounter (Signed)
I spoke with the patient's daughter and she is aware of all the appointment dates and times. She thanked me for the call

## 2020-11-29 NOTE — Progress Notes (Signed)
No allergies to soy or egg Pt is not on blood thinners or diet pills Denies issues with sedation/intubation Denies atrial flutter/fib Denies constipation   Pt is aware of Covid safety and care partner requirements.  [name], daughter is with patient.  Pt receptive with difficulty to prep procedure.  Daughter will guide patient to do what is needed as best that she is able to do.

## 2020-12-06 ENCOUNTER — Telehealth: Payer: Self-pay | Admitting: Dietician

## 2020-12-06 ENCOUNTER — Encounter: Payer: Medicare Other | Admitting: Dietician

## 2020-12-06 NOTE — Telephone Encounter (Signed)
Nutrition Follow-up:    Patient with rectal cancer. He has completed concurrent radiation and xeloda. Patient is scheduled for restaging sigmoidoscopy on 7/7.  Spoke with daughter via telephone. She reports patient is doing well overall, says his appetite comes and goes. She reports patient is drinking a lot more water and continues to drink Ensure. She is unsure of how many, says likely one/day. Daughter reports patient is eating less fried foods, recalls baked chicken, tuna, and occasional pork chop.   Medications: reviewed  Labs: reviewed  Anthropometrics: Weight 175 lb on 6/27 increased from 170 lb 9.6 oz on 5/20  5/5 - 171 lb 9.6 oz 4/3 - 173 lb 12.8 oz    NUTRITION DIAGNOSIS: Food and nutrition related knowledge deficit ongoing    INTERVENTION:  Encouraged small frequent meals and snacks with focus on protein Continue drinking Ensure Enlive daily  Will mail Ensure coupons Patient and daughter have contact information     MONITORING, EVALUATION, GOAL: weight trends, intake   NEXT VISIT: Follow-up with pt daughter via telephone ~3-4 weeks

## 2020-12-08 ENCOUNTER — Ambulatory Visit (AMBULATORY_SURGERY_CENTER): Payer: Medicare Other | Admitting: Internal Medicine

## 2020-12-08 ENCOUNTER — Other Ambulatory Visit: Payer: Self-pay

## 2020-12-08 ENCOUNTER — Encounter: Payer: Self-pay | Admitting: Internal Medicine

## 2020-12-08 VITALS — BP 157/63 | HR 58 | Temp 98.1°F | Resp 17 | Ht 72.0 in | Wt 173.0 lb

## 2020-12-08 DIAGNOSIS — C20 Malignant neoplasm of rectum: Secondary | ICD-10-CM | POA: Diagnosis not present

## 2020-12-08 DIAGNOSIS — Z0389 Encounter for observation for other suspected diseases and conditions ruled out: Secondary | ICD-10-CM

## 2020-12-08 MED ORDER — SODIUM CHLORIDE 0.9 % IV SOLN: 500.0000 mL | Freq: Once | INTRAVENOUS | Status: DC

## 2020-12-08 NOTE — Progress Notes (Signed)
Pt's states no medical or surgical changes since previsit or office visit. 

## 2020-12-08 NOTE — Patient Instructions (Addendum)
Things looked normal - no signs of cancer. I took biopsies to double check.  I appreciate the opportunity to care for you. Gatha Mayer, MD, FACG   YOU HAD AN ENDOSCOPIC PROCEDURE TODAY AT [facility]:   Refer to the procedure report that was given to you for any specific questions about what was found during the examination.  If the procedure report does not answer your questions, please call your gastroenterologist to clarify.  If you requested that your care partner not be given the details of your procedure findings, then the procedure report has been included in a sealed envelope for you to review at your convenience later.  YOU SHOULD EXPECT: Some feelings of bloating in the abdomen. Passage of more gas than usual.  Walking can help get rid of the air that was put into your GI tract during the procedure and reduce the bloating. If you had a lower endoscopy (such as a colonoscopy or flexible sigmoidoscopy) you may notice spotting of blood in your stool or on the toilet paper. If you underwent a bowel prep for your procedure, you may not have a normal bowel movement for a few days.  Please Note:  You might notice some irritation and congestion in your nose or some drainage.  This is from the oxygen used during your procedure.  There is no need for concern and it should clear up in a day or so.  SYMPTOMS TO REPORT IMMEDIATELY:  Following lower endoscopy (colonoscopy or flexible sigmoidoscopy):  Excessive amounts of blood in the stool  Significant tenderness or worsening of abdominal pains  Swelling of the abdomen that is new, acute  Fever of 100F or higher  Black, tarry-looking stools  For urgent or emergent issues, a gastroenterologist can be reached at any hour by calling [phone]. Do not use MyChart messaging for urgent concerns.    DIET:  We do recommend a small meal at first, but then you may proceed to your regular diet.  Drink plenty of fluids but you  should avoid alcoholic beverages for 24 hours.  ACTIVITY:  You should plan to take it easy for the rest of today and you should NOT DRIVE or use heavy machinery until tomorrow (because of the sedation medicines used during the test).    FOLLOW UP: Our staff will call the number listed on your records 48-72 hours following your procedure to check on you and address any questions or concerns that you may have regarding the information given to you following your procedure. If we do not reach you, we will leave a message.  We will attempt to reach you two times.  During this call, we will ask if you have developed any symptoms of COVID 19. If you develop any symptoms (ie: fever, flu-like symptoms, shortness of breath, cough etc.) before then, please call [phone].  If you test positive for Covid 19 in the 2 weeks post procedure, please call and report this information to [place].    If any biopsies were taken you will be contacted by phone or by letter within the next 1-3 weeks.  Please call us at [phone] if you have not heard about the biopsies in 3 weeks.    SIGNATURES/CONFIDENTIALITY: You and/or your care partner have signed paperwork which will be entered into your electronic medical record.  These signatures attest to the fact that that the information above on your After Visit Summary has been reviewed and is understood.  Full  responsibility of the confidentiality of this discharge information lies with you and/or your care-partner.  

## 2020-12-08 NOTE — Progress Notes (Signed)
A/ox3, pleased with MAC, report to RN 

## 2020-12-08 NOTE — Progress Notes (Signed)
Called to room to assist during endoscopic procedure.  Patient ID and intended procedure confirmed with present staff. Received instructions for my participation in the procedure from the performing physician.  

## 2020-12-08 NOTE — Op Note (Signed)
[place] Patient Name: Isaac Phelps Procedure Date: 12/08/2020 9:54 AM MRN: [id number] Endoscopist: Gatha Mayer , MD Age: 87 Referring MD:  Date of Birth: March 10, 1931 Gender: Male Account #: [id number] Procedure:                Flexible Sigmoidoscopy Indications:              Rectal cancer, Follow-up of rectal cancer Medicines:                None Procedure:                Pre-Anesthesia Assessment:                           - Prior to the procedure, a History and Physical                            was performed, and patient medications and                            allergies were reviewed. The patient's tolerance of                            previous anesthesia was also reviewed. The risks                            and benefits of the procedure and the sedation                            options and risks were discussed with the patient.                            All questions were answered, and informed consent                            was obtained. Prior Anticoagulants: The patient has                            taken no previous anticoagulant or antiplatelet                            agents. ASA Grade Assessment: III - A patient with                            severe systemic disease. After reviewing the risks                            and benefits, the patient was deemed in                            satisfactory condition to undergo the procedure.                           After obtaining informed consent, the scope was  passed under direct vision. The Olympus PCF-[id number]                            (#[id number]) Colonoscope was introduced through the                            anus and advanced to the the sigmoid colon. The                            flexible sigmoidoscopy was accomplished without                            difficulty. The patient tolerated the procedure                            well. The quality of the bowel  preparation was                            excellent. Scope In: Scope Out: Findings:                 The perianal and digital rectal examinations were                            normal.                           The rectum, recto-sigmoid colon, sigmoid colon and                            distal sigmoid colon appeared normal. Biopsies were                            taken with a cold forceps for histology.                            Verification of patient identification for the                            specimen was done. Estimated blood loss was minimal. Complications:            No immediate complications. Estimated Blood Loss:     Estimated blood loss was minimal. Impression:               - The rectum, recto-sigmoid colon, sigmoid colon                            and distal sigmoid colon are normal. Biopsied                            rectum to look for microscopic cancer - I could not                            see any clear changes of abnormal mucosa though  slight edema and loss of vascular pattern distal                            rectum Recommendation:           - Patient has a contact number available for                            emergencies. The signs and symptoms of potential                            delayed complications were discussed with the                            patient. Return to normal activities tomorrow.                            Written discharge instructions were provided to the                            patient.                           - Resume previous diet.                           - Await pathology results. Gatha Mayer, MD 12/08/2020 10:50:46 AM This report has been signed electronically.

## 2020-12-08 NOTE — Progress Notes (Signed)
Pt up to void at bedside in urinal upon entry into recovery.  Had moderate amount of blood clot on pad underneath him.  Dr. Carlean Purl made aware and observed amount.  Pt to stay in recovery to assess further bleeding per Dr. Carlean Purl.  VSS

## 2020-12-12 ENCOUNTER — Telehealth: Payer: Self-pay

## 2020-12-12 ENCOUNTER — Telehealth: Payer: Self-pay | Admitting: Adult Health

## 2020-12-12 DIAGNOSIS — I10 Essential (primary) hypertension: Secondary | ICD-10-CM

## 2020-12-12 NOTE — Telephone Encounter (Signed)
Left message on follow up call. 

## 2020-12-12 NOTE — Telephone Encounter (Signed)
Pt call and stated he need a refill on amLODipine (NORVASC) 10 MG tablet , carvedilol (COREG) 3.125 MG tablet and  hydrALAZINE (APRESOLINE) 50 MG tablet sent to  [id number] - [place], [address] AT [place] Phone:  [phone]  Fax:  [phone]

## 2020-12-12 NOTE — Telephone Encounter (Signed)
  Follow up Call-  Call back number 12/08/2020  Post procedure Call Back phone  # [name] (daughter) [phone]  Permission to leave phone message Yes  Some recent data might be hidden     2nd follow up call made.  NALM

## 2020-12-13 MED ORDER — AMLODIPINE BESYLATE 10 MG PO TABS: 10.0000 mg | ORAL_TABLET | Freq: Every day | ORAL | 1 refills | Status: DC

## 2020-12-13 MED ORDER — HYDRALAZINE HCL 50 MG PO TABS: 50.0000 mg | ORAL_TABLET | Freq: Three times a day (TID) | ORAL | 1 refills | Status: DC

## 2020-12-13 MED ORDER — CARVEDILOL 3.125 MG PO TABS: 3.1250 mg | ORAL_TABLET | Freq: Two times a day (BID) | ORAL | 1 refills | Status: DC

## 2020-12-13 NOTE — Telephone Encounter (Signed)
Rx's sent in. °

## 2020-12-19 ENCOUNTER — Inpatient Hospital Stay: Payer: Medicare Other | Attending: Radiation Oncology | Admitting: Oncology

## 2020-12-19 ENCOUNTER — Ambulatory Visit
Admission: RE | Admit: 2020-12-19 | Discharge: 2020-12-19 | Disposition: A | Discharge status: Home / Self Care | Payer: Medicare Other | Source: Ambulatory Visit | Attending: Radiation Oncology | Admitting: Radiation Oncology

## 2020-12-19 ENCOUNTER — Other Ambulatory Visit: Payer: Self-pay

## 2020-12-19 VITALS — BP 163/71 | HR 66 | Temp 97.2°F | Resp 18 | Wt 177.2 lb

## 2020-12-19 DIAGNOSIS — C20 Malignant neoplasm of rectum: Secondary | ICD-10-CM

## 2020-12-19 DIAGNOSIS — E538 Deficiency of other specified B group vitamins: Secondary | ICD-10-CM

## 2020-12-19 DIAGNOSIS — Z923 Personal history of irradiation: Secondary | ICD-10-CM | POA: Diagnosis not present

## 2020-12-19 DIAGNOSIS — N400 Enlarged prostate without lower urinary tract symptoms: Secondary | ICD-10-CM | POA: Insufficient documentation

## 2020-12-19 DIAGNOSIS — Z9221 Personal history of antineoplastic chemotherapy: Secondary | ICD-10-CM | POA: Diagnosis not present

## 2020-12-19 DIAGNOSIS — I10 Essential (primary) hypertension: Secondary | ICD-10-CM | POA: Insufficient documentation

## 2020-12-19 DIAGNOSIS — D508 Other iron deficiency anemias: Secondary | ICD-10-CM | POA: Insufficient documentation

## 2020-12-19 NOTE — Progress Notes (Signed)
  Radiation Oncology         [phone] ________________________________  Name: Isaac Phelps MRN: [id number]  Date of Service: 12/19/2020  DOB: 08/14/1930  Post Treatment Telephone Note  Diagnosis:   Stage I, cT2N0M0 adenocarcinoma of the rectum  Interval Since Last Radiation:  9 weeks   09/12/2020 through 10/21/2020 Site Technique Total Dose (Gy) Dose per Fx (Gy) Completed Fx Beam Energies  Rectum: Rectum 3D 45/45 1.8 25/25 10X, 15X  Rectum: Rectum_Bst 3D 9/9 1.8 5/5 10X, 15X    Narrative:  The patient was contacted today for routine follow-up. During treatment he did very well with radiotherapy and did not have significant desquamation. His daughter states his appetite is better now and has had some constipation from taking oral iron, and is using cathartics.  Impression/Plan: 1. Stage I, cT2N0M0 adenocarcinoma of the rectum. The patient has been doing well since completion of radiotherapy. We discussed that we would be happy to continue to follow him as needed, but he will also continue to follow up with Dr. Benay Spice in medical oncology and Dr. Dema Severin in colorectal surgery.      Carola Rhine, PAC

## 2020-12-19 NOTE — Progress Notes (Signed)
  [place] OFFICE PROGRESS NOTE   Diagnosis: Rectal cancer  INTERVAL HISTORY:   Isaac Phelps returns as scheduled.  He feels well.  No difficulty with bowel function.  He had transient bleeding following the sigmoidoscopy on 12/08/2020.  This has resolved.  No complaint today.  Objective:  Vital signs in last 24 hours:  Blood pressure (!) 163/71, pulse 66, temperature (!) 97.2 F (36.2 C), temperature source Tympanic, resp. rate 18, weight 177 lb 3.2 oz (80.4 kg), SpO2 100 %.     Lymphatics: No cervical, supraclavicular, axillary, or inguinal nodes Resp: Lungs clear bilaterally Cardio: Regular rate and rhythm GI: No hepatosplenomegaly, nontender, no mass, reducible bilateral inguinal hernias Vascular: No leg edema  Lab Results:  Lab Results  Component Value Date   WBC 5.7 11/18/2020   HGB 9.1 (L) 11/18/2020   HCT 28.6 (L) 11/18/2020   MCV 85.9 11/18/2020   PLT 234 11/18/2020   NEUTROABS 4.7 11/18/2020    CMP  Lab Results  Component Value Date   NA 140 10/21/2020   K 4.3 10/21/2020   CL 107 10/21/2020   CO2 23 10/21/2020   GLUCOSE 106 (H) 10/21/2020   BUN 31 (H) 10/21/2020   CREATININE 2.05 (H) 10/21/2020   CALCIUM 8.8 (L) 10/21/2020   PROT 7.5 10/21/2020   ALBUMIN 4.1 10/21/2020   AST 35 10/21/2020   ALT 11 10/21/2020   ALKPHOS 51 10/21/2020   BILITOT 0.4 10/21/2020   GFRNONAA 31 (L) 10/21/2020   GFRAA 42 (L) 02/14/2015    Lab Results  Component Value Date   CEA1 5.01 (H) 11/18/2020   CEA 5.30 (H) 11/18/2020     Medications: I have reviewed the patient's current medications.   Assessment/Plan: Rectal cancer Colonoscopy 08/03/2020-distal rectal mass abutting the anus, biopsy confirmed adenocarcinoma CT angiography 07/29/2020-hazy soft tissue at the porta hepatis encasing the right hepatic artery, exophytic soft tissue lesion at the upper pole of the left kidney-indeterminate, partially solid nodule in the left lower lobe-present on a CT  in 2013 suspicious, suspicious for a low-grade pulmonary neoplasm, enlarged prostate, MRI abdomen 07/30/2020-no evidence of mass in the porta hepatis, hypoenhancing lesions in the upper pole of the left kidney and interpolar region of the right kidney suspicious for small neoplasms, hemosiderosis of the liver and spleen MRI pelvis 08/17/2020-T2, N0; minimal if any distance from tumor to internal anal sphincter Radiation and capecitabine 09/12/2020-10/21/2020 Sigmoidoscopy 12/08/2020-normal rectum and sigmoid, no tumor seen, slight mucosal edema and loss of vascular pattern in the distal rectum, biopsy-benign colonic mucosa, no malignancy Enlarged prostate with an elevated PSA 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  The repeat sigmoidoscopy is consistent with a complete clinical response to radiation and Xeloda.  The CEA remains mildly elevated.  It is possible the elevated CEA is related to prostate cancer.  He is scheduled to see a urologist for evaluation of the elevated PSA.  Isaac. [facility] understands the rectal cancer could progress.  I will recommend a surveillance sigmoidoscopy in 3-4 months.  He will resume vitamin B12 therapy.  He will return for an office visit, CBC, and CEA on 02/28/2021.    Betsy Coder, MD  12/19/2020  11:24 AM

## 2021-01-03 ENCOUNTER — Telehealth: Payer: Self-pay | Admitting: Dietician

## 2021-01-03 NOTE — Telephone Encounter (Signed)
Nutrition  Attempted to contact patient via telephone for nutrition follow-up. Patient did not answer, no voicemail available to leave message. Attempted to contact daughter [name]), however no answer. Left message with request for return call on voicemail. Contact information provided.

## 2021-01-10 ENCOUNTER — Other Ambulatory Visit: Payer: Self-pay | Admitting: Urology

## 2021-01-10 DIAGNOSIS — D4101 Neoplasm of uncertain behavior of right kidney: Secondary | ICD-10-CM

## 2021-01-18 ENCOUNTER — Other Ambulatory Visit (HOSPITAL_BASED_OUTPATIENT_CLINIC_OR_DEPARTMENT_OTHER): Payer: Self-pay

## 2021-02-28 ENCOUNTER — Encounter: Payer: Self-pay | Admitting: Nurse Practitioner

## 2021-02-28 ENCOUNTER — Inpatient Hospital Stay: Payer: Medicare Other

## 2021-02-28 ENCOUNTER — Other Ambulatory Visit: Payer: Self-pay

## 2021-02-28 ENCOUNTER — Inpatient Hospital Stay: Payer: Medicare Other | Attending: Radiation Oncology | Admitting: Nurse Practitioner

## 2021-02-28 VITALS — BP 157/79 | HR 65 | Temp 98.3°F | Resp 18 | Ht 72.0 in | Wt 181.2 lb

## 2021-02-28 DIAGNOSIS — E538 Deficiency of other specified B group vitamins: Secondary | ICD-10-CM | POA: Diagnosis not present

## 2021-02-28 DIAGNOSIS — Z85048 Personal history of other malignant neoplasm of rectum, rectosigmoid junction, and anus: Secondary | ICD-10-CM | POA: Insufficient documentation

## 2021-02-28 DIAGNOSIS — I2510 Atherosclerotic heart disease of native coronary artery without angina pectoris: Secondary | ICD-10-CM | POA: Insufficient documentation

## 2021-02-28 DIAGNOSIS — C20 Malignant neoplasm of rectum: Secondary | ICD-10-CM

## 2021-02-28 DIAGNOSIS — N400 Enlarged prostate without lower urinary tract symptoms: Secondary | ICD-10-CM | POA: Insufficient documentation

## 2021-02-28 DIAGNOSIS — Z79899 Other long term (current) drug therapy: Secondary | ICD-10-CM | POA: Diagnosis not present

## 2021-02-28 DIAGNOSIS — M109 Gout, unspecified: Secondary | ICD-10-CM | POA: Diagnosis not present

## 2021-02-28 DIAGNOSIS — I10 Essential (primary) hypertension: Secondary | ICD-10-CM | POA: Insufficient documentation

## 2021-02-28 DIAGNOSIS — D509 Iron deficiency anemia, unspecified: Secondary | ICD-10-CM | POA: Diagnosis not present

## 2021-02-28 LAB — CBC WITH DIFFERENTIAL (CANCER CENTER ONLY)
Abs Immature Granulocytes: 0.02 10*3/uL (ref 0.00–0.07)
Basophils Absolute: 0 10*3/uL (ref 0.0–0.1)
Basophils Relative: 1 %
Eosinophils Absolute: 0.1 10*3/uL (ref 0.0–0.5)
Eosinophils Relative: 3 %
HCT: 30.4 % — ABNORMAL LOW (ref 39.0–52.0)
Hemoglobin: 9.6 g/dL — ABNORMAL LOW (ref 13.0–17.0)
Immature Granulocytes: 0 %
Lymphocytes Relative: 8 %
Lymphs Abs: 0.4 10*3/uL — ABNORMAL LOW (ref 0.7–4.0)
MCH: 26.3 pg (ref 26.0–34.0)
MCHC: 31.6 g/dL (ref 30.0–36.0)
MCV: 83.3 fL (ref 80.0–100.0)
Monocytes Absolute: 0.4 10*3/uL (ref 0.1–1.0)
Monocytes Relative: 8 %
Neutro Abs: 4.2 10*3/uL (ref 1.7–7.7)
Neutrophils Relative %: 80 %
Platelet Count: 244 10*3/uL (ref 150–400)
RBC: 3.65 MIL/uL — ABNORMAL LOW (ref 4.22–5.81)
RDW: 15.2 % (ref 11.5–15.5)
WBC Count: 5.3 10*3/uL (ref 4.0–10.5)
nRBC: 0 % (ref 0.0–0.2)

## 2021-02-28 LAB — CEA (IN HOUSE-CHCC): CEA (CHCC-In House): 3.59 ng/mL (ref 0.00–5.00)

## 2021-02-28 LAB — CEA (ACCESS): CEA (CHCC): 4.25 ng/mL (ref 0.00–5.00)

## 2021-02-28 NOTE — Progress Notes (Signed)
  [place] OFFICE PROGRESS NOTE   Diagnosis: Rectal cancer  INTERVAL HISTORY:   Isaac Phelps returns as scheduled.  In general he feels well.  He reports bowels are moving regularly.  No blood or pain with bowel movements.  He describes appetite is okay.  Weight stable.  No chest pain or shortness of breath.   Objective:  Vital signs in last 24 hours:  Blood pressure (!) 102/58, pulse 65, temperature 98.3 F (36.8 C), temperature source Oral, resp. rate 18, height 6' (1.829 m), weight 181 lb 3.2 oz (82.2 kg), SpO2 100 %.    Lymphatics: No palpable cervical, supraclavicular, axillary or inguinal lymph nodes. Resp: Lungs clear bilaterally. Cardio: Initially marked tachycardia, regular at 130; subsequent regular at 60. GI: No hepatosplenomegaly.  No mass. Vascular: Trace lower leg edema bilaterally.    Lab Results:  Lab Results  Component Value Date   WBC 5.3 02/28/2021   HGB 9.6 (L) 02/28/2021   HCT 30.4 (L) 02/28/2021   MCV 83.3 02/28/2021   PLT 244 02/28/2021   NEUTROABS 4.2 02/28/2021    Imaging:  No results found.  Medications: I have reviewed the patient's current medications.  Assessment/Plan: Rectal cancer Colonoscopy 08/03/2020-distal rectal mass abutting the anus, biopsy confirmed adenocarcinoma CT angiography 07/29/2020-hazy soft tissue at the porta hepatis encasing the right hepatic artery, exophytic soft tissue lesion at the upper pole of the left kidney-indeterminate, partially solid nodule in the left lower lobe-present on a CT in 2013 suspicious, suspicious for a low-grade pulmonary neoplasm, enlarged prostate, MRI abdomen 07/30/2020-no evidence of mass in the porta hepatis, hypoenhancing lesions in the upper pole of the left kidney and interpolar region of the right kidney suspicious for small neoplasms, hemosiderosis of the liver and spleen MRI pelvis 08/17/2020-T2, N0; minimal if any distance from tumor to internal anal sphincter Radiation  and capecitabine 09/12/2020-10/21/2020 Sigmoidoscopy 12/08/2020-normal rectum and sigmoid, no tumor seen, slight mucosal edema and loss of vascular pattern in the distal rectum, biopsy-benign colonic mucosa, no malignancy Enlarged prostate with an elevated PSA 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  There is no clinical evidence of disease progression.  We will follow-up on the CEA from today.  Referral made to Dr. Carlean Purl for surveillance colonoscopy at a 3 to 88-month interval from the previous which was done 12/08/2020.  He had an episode of marked tachycardia while in the office.  Heart rate spontaneously returned to normal range.  He is aware of this happening in the past.  He has declined to wear a cardiac monitor when this occurred previously.  He was asymptomatic during the episode.  He does not wish to pursue further evaluation.  He will return for lab and follow-up in approximately 2 months.  We are available to see him sooner if needed.   Ned Card ANP/GNP-BC   02/28/2021  11:41 AM

## 2021-03-01 ENCOUNTER — Encounter: Payer: Self-pay | Admitting: *Deleted

## 2021-03-01 LAB — PROSTATE-SPECIFIC AG, SERUM (LABCORP): Prostate Specific Ag, Serum: 1.4 ng/mL (ref 0.0–4.0)

## 2021-03-01 NOTE — Progress Notes (Signed)
Faxed referral order and last office note to [facility] for Dr. Carlean Purl. Due f/u sigmoidoscopy 3-4 months from July procedure.

## 2021-04-19 ENCOUNTER — Other Ambulatory Visit: Payer: Self-pay | Admitting: Adult Health

## 2021-04-19 DIAGNOSIS — I10 Essential (primary) hypertension: Secondary | ICD-10-CM

## 2021-04-19 DIAGNOSIS — E785 Hyperlipidemia, unspecified: Secondary | ICD-10-CM

## 2021-04-19 NOTE — Telephone Encounter (Signed)
Patient called to follow up on refill for hydrALAZINE (APRESOLINE) 50 MG tablet      Please send to  [facility] - [place], [address] AT [address] Phone:  [phone]  Fax:  [phone]           Please advise

## 2021-05-01 ENCOUNTER — Inpatient Hospital Stay (HOSPITAL_BASED_OUTPATIENT_CLINIC_OR_DEPARTMENT_OTHER): Payer: Medicare Other | Admitting: Oncology

## 2021-05-01 ENCOUNTER — Inpatient Hospital Stay: Payer: Medicare Other | Attending: Radiation Oncology

## 2021-05-01 ENCOUNTER — Other Ambulatory Visit: Payer: Self-pay

## 2021-05-01 VITALS — BP 160/68 | HR 66 | Temp 97.8°F | Resp 20 | Ht 72.0 in | Wt 177.6 lb

## 2021-05-01 DIAGNOSIS — M109 Gout, unspecified: Secondary | ICD-10-CM | POA: Insufficient documentation

## 2021-05-01 DIAGNOSIS — Z85048 Personal history of other malignant neoplasm of rectum, rectosigmoid junction, and anus: Secondary | ICD-10-CM | POA: Insufficient documentation

## 2021-05-01 DIAGNOSIS — D509 Iron deficiency anemia, unspecified: Secondary | ICD-10-CM | POA: Insufficient documentation

## 2021-05-01 DIAGNOSIS — I2510 Atherosclerotic heart disease of native coronary artery without angina pectoris: Secondary | ICD-10-CM | POA: Diagnosis not present

## 2021-05-01 DIAGNOSIS — E538 Deficiency of other specified B group vitamins: Secondary | ICD-10-CM | POA: Insufficient documentation

## 2021-05-01 DIAGNOSIS — C20 Malignant neoplasm of rectum: Secondary | ICD-10-CM

## 2021-05-01 DIAGNOSIS — N400 Enlarged prostate without lower urinary tract symptoms: Secondary | ICD-10-CM | POA: Diagnosis not present

## 2021-05-01 DIAGNOSIS — I10 Essential (primary) hypertension: Secondary | ICD-10-CM | POA: Diagnosis not present

## 2021-05-01 LAB — CBC WITH DIFFERENTIAL (CANCER CENTER ONLY)
Abs Immature Granulocytes: 0.03 10*3/uL (ref 0.00–0.07)
Basophils Absolute: 0 10*3/uL (ref 0.0–0.1)
Basophils Relative: 1 %
Eosinophils Absolute: 0.1 10*3/uL (ref 0.0–0.5)
Eosinophils Relative: 2 %
HCT: 32.7 % — ABNORMAL LOW (ref 39.0–52.0)
Hemoglobin: 10 g/dL — ABNORMAL LOW (ref 13.0–17.0)
Immature Granulocytes: 1 %
Lymphocytes Relative: 7 %
Lymphs Abs: 0.4 10*3/uL — ABNORMAL LOW (ref 0.7–4.0)
MCH: 25.1 pg — ABNORMAL LOW (ref 26.0–34.0)
MCHC: 30.6 g/dL (ref 30.0–36.0)
MCV: 82 fL (ref 80.0–100.0)
Monocytes Absolute: 0.4 10*3/uL (ref 0.1–1.0)
Monocytes Relative: 7 %
Neutro Abs: 4.9 10*3/uL (ref 1.7–7.7)
Neutrophils Relative %: 82 %
Platelet Count: 251 10*3/uL (ref 150–400)
RBC: 3.99 MIL/uL — ABNORMAL LOW (ref 4.22–5.81)
RDW: 15.9 % — ABNORMAL HIGH (ref 11.5–15.5)
WBC Count: 5.9 10*3/uL (ref 4.0–10.5)
nRBC: 0 % (ref 0.0–0.2)

## 2021-05-01 LAB — CEA (ACCESS): CEA (CHCC): 5 ng/mL (ref 0.00–5.00)

## 2021-05-01 NOTE — Progress Notes (Signed)
  [place] OFFICE PROGRESS NOTE   Diagnosis: Rectal cancer  INTERVAL HISTORY:   Mr. Helsley returns as scheduled.  He feels well.  No complaint.  No difficulty with bowel function.  No rectal pain or bleeding.  Objective:  Vital signs in last 24 hours:  Blood pressure (!) 160/68, pulse 66, temperature 97.8 F (36.6 C), temperature source Oral, resp. rate 20, height 6' (1.829 m), weight 177 lb 9.6 oz (80.6 kg), SpO2 100 %.    Lymphatics: No cervical, supraclavicular, axillary, or inguinal nodes Resp: Lungs clear bilaterally Cardio: Regular rate and rhythm GI: No hepatosplenomegaly, no mass, reducible bilateral inguinal hernias rectal declined by Mr. Satter Vascular: No leg edema  Lab Results:  Lab Results  Component Value Date   WBC 5.9 05/01/2021   HGB 10.0 (L) 05/01/2021   HCT 32.7 (L) 05/01/2021   MCV 82.0 05/01/2021   PLT 251 05/01/2021   NEUTROABS 4.9 05/01/2021    CMP  Lab Results  Component Value Date   NA 140 10/21/2020   K 4.3 10/21/2020   CL 107 10/21/2020   CO2 23 10/21/2020   GLUCOSE 106 (H) 10/21/2020   BUN 31 (H) 10/21/2020   CREATININE 2.05 (H) 10/21/2020   CALCIUM 8.8 (L) 10/21/2020   PROT 7.5 10/21/2020   ALBUMIN 4.1 10/21/2020   AST 35 10/21/2020   ALT 11 10/21/2020   ALKPHOS 51 10/21/2020   BILITOT 0.4 10/21/2020   GFRNONAA 31 (L) 10/21/2020   GFRAA 42 (L) 02/14/2015    Lab Results  Component Value Date   CEA1 3.59 02/28/2021   CEA 4.25 02/28/2021    Medications: I have reviewed the patient's current medications.   Assessment/Plan: Rectal cancer Colonoscopy 08/03/2020-distal rectal mass abutting the anus, biopsy confirmed adenocarcinoma CT angiography 07/29/2020-hazy soft tissue at the porta hepatis encasing the right hepatic artery, exophytic soft tissue lesion at the upper pole of the left kidney-indeterminate, partially solid nodule in the left lower lobe-present on a CT in 2013 suspicious, suspicious for a  low-grade pulmonary neoplasm, enlarged prostate, MRI abdomen 07/30/2020-no evidence of mass in the porta hepatis, hypoenhancing lesions in the upper pole of the left kidney and interpolar region of the right kidney suspicious for small neoplasms, hemosiderosis of the liver and spleen MRI pelvis 08/17/2020-T2, N0; minimal if any distance from tumor to internal anal sphincter Radiation and capecitabine 09/12/2020-10/21/2020 Sigmoidoscopy 12/08/2020-normal rectum and sigmoid, no tumor seen, slight mucosal edema and loss of vascular pattern in the distal rectum, biopsy-benign colonic mucosa, no malignancy Enlarged prostate with an elevated PSA 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 Bolender is in clinical remission from rectal cancer.  I will refer him to Dr. Carlean Purl for another surveillance sigmoidoscopy.  He will return for an office visit in 3 months.  He stated again today that he will not agree to surgery that would require a colostomy. Betsy Coder, MD  05/01/2021  11:04 AM

## 2021-05-02 ENCOUNTER — Encounter: Payer: Self-pay | Admitting: *Deleted

## 2021-05-02 NOTE — Progress Notes (Signed)
Faxed referral order and office note to [facility] for Dr. Carlean Purl. Due sigmoidoscopy in December/January.

## 2021-05-03 ENCOUNTER — Telehealth: Payer: Self-pay | Admitting: Internal Medicine

## 2021-05-04 NOTE — Telephone Encounter (Signed)
A direct flex sig is ok  Notes for previsit  Only needs a 1 enema prep  Procedure will be unsedated

## 2021-05-04 NOTE — Telephone Encounter (Signed)
Left message for pt to call back  °

## 2021-05-05 NOTE — Telephone Encounter (Signed)
Left message to call Back

## 2021-05-05 NOTE — Telephone Encounter (Signed)
Left message for pt to call back  °

## 2021-05-08 NOTE — Telephone Encounter (Signed)
Left message for Pt to call back. Left message on pt spouse phone and daughter phone to call back.

## 2021-05-09 NOTE — Telephone Encounter (Signed)
Left message for pt to call back  °

## 2021-05-10 ENCOUNTER — Other Ambulatory Visit: Payer: Self-pay

## 2021-05-10 DIAGNOSIS — C20 Malignant neoplasm of rectum: Secondary | ICD-10-CM

## 2021-05-10 NOTE — Telephone Encounter (Signed)
Left message for pt to call back  °

## 2021-05-10 NOTE — Telephone Encounter (Signed)
After multiple unsuccessful attempts to reach pt by phone a letter was created for the pt and sent via My Chart and Mail

## 2021-05-11 ENCOUNTER — Telehealth: Payer: Self-pay

## 2021-05-11 NOTE — Telephone Encounter (Signed)
Attempted to reach patient for pre visit appt - patient did not answer phone and message unable to be left as no voicemail- No show letter sent to patient

## 2021-06-07 ENCOUNTER — Telehealth: Payer: Self-pay | Admitting: *Deleted

## 2021-06-07 ENCOUNTER — Other Ambulatory Visit: Payer: Medicare Other | Admitting: Internal Medicine

## 2021-06-07 NOTE — Telephone Encounter (Signed)
Left VM for patient that [facility] has been trying to reach him to schedule his 6 month sigmoidoscopy. Encouraged him to call office to schedule.

## 2021-06-20 ENCOUNTER — Other Ambulatory Visit: Payer: Self-pay | Admitting: Adult Health

## 2021-06-20 DIAGNOSIS — N400 Enlarged prostate without lower urinary tract symptoms: Secondary | ICD-10-CM

## 2021-06-23 ENCOUNTER — Other Ambulatory Visit: Payer: Self-pay | Admitting: Adult Health

## 2021-06-23 DIAGNOSIS — I10 Essential (primary) hypertension: Secondary | ICD-10-CM

## 2021-06-26 ENCOUNTER — Telehealth: Payer: Self-pay | Admitting: Adult Health

## 2021-06-26 NOTE — Telephone Encounter (Signed)
Patient called to get refill on hydrALAZINE (APRESOLINE) 50 MG tablet Patient states pharmacy is saying they do not have it.     Please send to  [facility] [place], [address] AT [address] Phone:  [phone]  Fax:  [phone]        Please advise

## 2021-06-27 NOTE — Telephone Encounter (Signed)
Spoke with Isaac Phelps at [facility] as the Rx was sent on 1/22.  Isaac Phelps stated the Rx is ready for pick up and I informed the patient's daughter of this.

## 2021-07-26 ENCOUNTER — Other Ambulatory Visit: Payer: Self-pay

## 2021-07-26 DIAGNOSIS — C20 Malignant neoplasm of rectum: Secondary | ICD-10-CM

## 2021-08-03 ENCOUNTER — Encounter: Payer: Self-pay | Admitting: *Deleted

## 2021-08-03 ENCOUNTER — Inpatient Hospital Stay: Payer: Medicare Other | Attending: Radiation Oncology

## 2021-08-03 ENCOUNTER — Inpatient Hospital Stay (HOSPITAL_BASED_OUTPATIENT_CLINIC_OR_DEPARTMENT_OTHER): Payer: Medicare Other | Admitting: Nurse Practitioner

## 2021-08-03 ENCOUNTER — Other Ambulatory Visit: Payer: Self-pay

## 2021-08-03 ENCOUNTER — Telehealth: Payer: Self-pay

## 2021-08-03 ENCOUNTER — Encounter: Payer: Self-pay | Admitting: Nurse Practitioner

## 2021-08-03 VITALS — BP 152/64 | HR 66 | Temp 97.9°F | Resp 19 | Ht 72.0 in | Wt 181.0 lb

## 2021-08-03 DIAGNOSIS — D509 Iron deficiency anemia, unspecified: Secondary | ICD-10-CM | POA: Insufficient documentation

## 2021-08-03 DIAGNOSIS — Z8673 Personal history of transient ischemic attack (TIA), and cerebral infarction without residual deficits: Secondary | ICD-10-CM | POA: Diagnosis not present

## 2021-08-03 DIAGNOSIS — Z7982 Long term (current) use of aspirin: Secondary | ICD-10-CM | POA: Diagnosis not present

## 2021-08-03 DIAGNOSIS — N400 Enlarged prostate without lower urinary tract symptoms: Secondary | ICD-10-CM | POA: Diagnosis not present

## 2021-08-03 DIAGNOSIS — Z85048 Personal history of other malignant neoplasm of rectum, rectosigmoid junction, and anus: Secondary | ICD-10-CM | POA: Insufficient documentation

## 2021-08-03 DIAGNOSIS — C20 Malignant neoplasm of rectum: Secondary | ICD-10-CM

## 2021-08-03 DIAGNOSIS — I10 Essential (primary) hypertension: Secondary | ICD-10-CM | POA: Diagnosis not present

## 2021-08-03 DIAGNOSIS — I2510 Atherosclerotic heart disease of native coronary artery without angina pectoris: Secondary | ICD-10-CM | POA: Diagnosis not present

## 2021-08-03 DIAGNOSIS — M109 Gout, unspecified: Secondary | ICD-10-CM | POA: Insufficient documentation

## 2021-08-03 DIAGNOSIS — Z79899 Other long term (current) drug therapy: Secondary | ICD-10-CM | POA: Diagnosis not present

## 2021-08-03 DIAGNOSIS — E538 Deficiency of other specified B group vitamins: Secondary | ICD-10-CM | POA: Insufficient documentation

## 2021-08-03 LAB — CBC WITH DIFFERENTIAL (CANCER CENTER ONLY)
Abs Immature Granulocytes: 0.02 10*3/uL (ref 0.00–0.07)
Basophils Absolute: 0 10*3/uL (ref 0.0–0.1)
Basophils Relative: 1 %
Eosinophils Absolute: 0.1 10*3/uL (ref 0.0–0.5)
Eosinophils Relative: 2 %
HCT: 30.3 % — ABNORMAL LOW (ref 39.0–52.0)
Hemoglobin: 9.2 g/dL — ABNORMAL LOW (ref 13.0–17.0)
Immature Granulocytes: 0 %
Lymphocytes Relative: 7 %
Lymphs Abs: 0.4 10*3/uL — ABNORMAL LOW (ref 0.7–4.0)
MCH: 24.3 pg — ABNORMAL LOW (ref 26.0–34.0)
MCHC: 30.4 g/dL (ref 30.0–36.0)
MCV: 80.2 fL (ref 80.0–100.0)
Monocytes Absolute: 0.4 10*3/uL (ref 0.1–1.0)
Monocytes Relative: 7 %
Neutro Abs: 4.3 10*3/uL (ref 1.7–7.7)
Neutrophils Relative %: 83 %
Platelet Count: 270 10*3/uL (ref 150–400)
RBC: 3.78 MIL/uL — ABNORMAL LOW (ref 4.22–5.81)
RDW: 16.6 % — ABNORMAL HIGH (ref 11.5–15.5)
WBC Count: 5.2 10*3/uL (ref 4.0–10.5)
nRBC: 0 % (ref 0.0–0.2)

## 2021-08-03 LAB — CEA (ACCESS): CEA (CHCC): 4.97 ng/mL (ref 0.00–5.00)

## 2021-08-03 NOTE — Telephone Encounter (Signed)
-----   Message from Tania Ade, RN sent at 08/03/2021 11:31 AM EST ----- ?Regarding: Surveillance Sigmoidoscopy due ?Refer to Dr Carlean Purl, established patient--rectal cancer, surveillance sigmoidoscopy; please call daughter, [name], [phone] with appt ?Referral order placed today. ? ?Thanks, ?Susan,RN ? ?

## 2021-08-03 NOTE — Progress Notes (Signed)
?  [place] ?OFFICE PROGRESS NOTE ? ? ?Diagnosis: Rectal cancer ? ?INTERVAL HISTORY:  ? ?Mr. Furr returns as scheduled.  No change in bowel habits.  No blood or pain with bowel movements.  He has not had the flexible sigmoidoscopy.  He agrees to another referral to get this scheduled. ? ?Objective: ? ?Vital signs in last 24 hours: ? ?Blood pressure (!) 152/64, pulse 66, temperature 97.9 ?F (36.6 ?C), temperature source Oral, resp. rate 19, height 6' (1.829 m), weight 181 lb (82.1 kg), SpO2 98 %. ?  ? ?Lymphatics: No palpable cervical, supraclavicular, axillary or inguinal lymph nodes. ?Resp: Lungs clear bilaterally. ?Cardio: Regular rate and rhythm. ?GI: Abdomen soft and nontender.  No hepatosplenomegaly. ?Vascular: No leg edema. ? ?Lab Results: ? ?Lab Results  ?Component Value Date  ? WBC 5.2 08/03/2021  ? HGB 9.2 (L) 08/03/2021  ? HCT 30.3 (L) 08/03/2021  ? MCV 80.2 08/03/2021  ? PLT 270 08/03/2021  ? NEUTROABS 4.3 08/03/2021  ? ? ?Imaging: ? ?No results found. ? ?Medications: I have reviewed the patient's current medications. ? ?Assessment/Plan: ?Rectal cancer ?Colonoscopy 08/03/2020-distal rectal mass abutting the anus, biopsy confirmed adenocarcinoma ?CT angiography 07/29/2020-hazy soft tissue at the porta hepatis encasing the right hepatic artery, exophytic soft tissue lesion at the upper pole of the left kidney-indeterminate, partially solid nodule in the left lower lobe-present on a CT in 2013 suspicious, suspicious for a low-grade pulmonary neoplasm, enlarged prostate, ?MRI abdomen 07/30/2020-no evidence of mass in the porta hepatis, hypoenhancing lesions in the upper pole of the left kidney and interpolar region of the right kidney suspicious for small neoplasms, hemosiderosis of the liver and spleen ?MRI pelvis 08/17/2020-T2, N0; minimal if any distance from tumor to internal anal sphincter ?Radiation and capecitabine 09/12/2020-10/21/2020 ?Sigmoidoscopy 12/08/2020-normal rectum and sigmoid, no  tumor seen, slight mucosal edema and loss of vascular pattern in the distal rectum, biopsy-benign colonic mucosa, no malignancy ?Enlarged prostate with an elevated PSA ?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  ? ?Disposition: Mr. Krueger remains in clinical remission from rectal cancer.  He agrees to a surveillance sigmoidoscopy.  We made a referral to Dr. Carlean Purl.  He will return for lab and follow-up in 3 months.  We are available to see him sooner if needed. ? ? ? ?Ned Card ANP/GNP-BC  ? ?08/03/2021  ?11:03 AM ? ? ? ? ? ? ? ?

## 2021-08-03 NOTE — Progress Notes (Signed)
Sent staff message to Dr. Karilyn Cota procedure scheduler re: referral for surveillance sigmoidoscopy needed and to reach out to daughter instead of patient. Also cc'd nurse. ?

## 2021-08-03 NOTE — Telephone Encounter (Signed)
Left message for pt Daughter [name] [phone] to call back: ?

## 2021-08-07 NOTE — Telephone Encounter (Signed)
Left message for pt Daughter [name] [phone] to call back: ?

## 2021-08-09 ENCOUNTER — Encounter: Payer: Self-pay | Admitting: Physician Assistant

## 2021-08-11 NOTE — Telephone Encounter (Signed)
Left message for pt Daughter [name] [phone] to call back: ?A letter was created and sent to pt via mail after several unsuccessful attempts to reach pt by phone:  ?

## 2021-08-11 NOTE — Telephone Encounter (Signed)
A letter was created and sent to pt via Mail after several  unsuccessful attempts to reach pt by phone  ?

## 2021-08-23 ENCOUNTER — Encounter: Payer: Self-pay | Admitting: Physician Assistant

## 2021-08-23 ENCOUNTER — Telehealth: Payer: Self-pay | Admitting: Physician Assistant

## 2021-08-23 ENCOUNTER — Ambulatory Visit (INDEPENDENT_AMBULATORY_CARE_PROVIDER_SITE_OTHER): Payer: Medicare Other | Admitting: Physician Assistant

## 2021-08-23 ENCOUNTER — Telehealth: Payer: Self-pay

## 2021-08-23 VITALS — BP 152/80 | HR 74 | Ht 72.0 in | Wt 183.2 lb

## 2021-08-23 DIAGNOSIS — Z85048 Personal history of other malignant neoplasm of rectum, rectosigmoid junction, and anus: Secondary | ICD-10-CM

## 2021-08-23 DIAGNOSIS — C20 Malignant neoplasm of rectum: Secondary | ICD-10-CM

## 2021-08-23 NOTE — Patient Instructions (Signed)
If you are age 86 or older, your body mass index should be between 23-30. Your Body mass index is 24.85 kg/m?Marland Kitchen If this is out of the aforementioned range listed, please consider follow up with your Primary Care Provider. ? ?If you are age 38 or younger, your body mass index should be between 19-25. Your Body mass index is 24.85 kg/m?Marland Kitchen If this is out of the aformentioned range listed, please consider follow up with your Primary Care Provider.   ? ?Due to recent changes in healthcare laws, you may see the results of your imaging and laboratory studies on MyChart before your provider has had a chance to review them.  We understand that in some cases there may be results that are confusing or concerning to you. Not all laboratory results come back in the same time frame and the provider may be waiting for multiple results in order to interpret others.  Please give [place] 48 hours in order for your provider to thoroughly review all the results before contacting the office for clarification of your results.  ? ? ?Thank you for choosing me and [facility] ? ?Ellouise Newer, PA-C ?

## 2021-08-23 NOTE — Telephone Encounter (Signed)
Called and spoke with Isaac Phelps. Patient has been scheduled for a flex sig in the [place] with Dr. Carlean Purl on Monday, 10/09/21 at 3:30 pm, arriving at 2:30 pm with a care partner. Isaac Phelps is aware that I will send pt's instructions via my chart and will also place a copy in the mail. Isaac Phelps is aware that pt will be sedated for this procedure. Isaac Phelps verbalized understanding and had no concerns at the end of the call. ? ?Ambulatory referral to GI in epic. Instructions sent to pt via my chart and mailed. ?

## 2021-08-23 NOTE — Telephone Encounter (Signed)
-----   Message from Levin Erp, [place] sent at 08/23/2021  1:11 PM EDT ----- ?Regarding: FW: Repeat flex sig? ?[place], ? ?Can you please call this patient and schedule him for flex sigmoidoscopy with Dr. Carlean Purl in the [facility].  He prefers that communication go through his daughter [name] who is listed in his chart.  I did already call and talk with her and tell her that Dr. Carlean Purl would like to proceed.  She is awaiting a phone call. ? ?Thanks, JLL ?----- Message ----- ?From: Gatha Mayer, MD ?Sent: 08/23/2021  12:32 PM EDT ?To: Levin Erp, PA ?Subject: RE: Repeat flex sig?                          ? ?Chart review tells [place] that oncology is looking for me to repeat this for surveillance of his cancer treatment so we should do it if the patient is willing.  It is not what I wrote down but when the oncologist need help in these situations I typically provide what they are asking for as they are managing the cancer.  In this case we have no other good way of knowing the status, I guess. ?----- Message ----- ?From: Levin Erp, PA ?Sent: 08/23/2021  10:17 AM EDT ?To: Gatha Mayer, MD ?Subject: Repeat flex sig?                              ? ?I saw this patient this afternoon in clinic, he was referred back to [place] by oncology for repeat flex sig but you said no repeat/recall after the last normal one.  Please let me know if patient needs repeat flex sigmoidoscopy at this point.  If he does we will have it arranged. ? ?Thanks, JLL ? ? ? ?

## 2021-08-23 NOTE — Telephone Encounter (Signed)
Telephone call ? ?Patient had office visit earlier this morning with me.  I communicated with Dr. Carlean Purl over lunch and he does want to go ahead and do repeat flex sigmoidoscopy at this point.  I will have my nursing staff call and get this arranged for Mr. Sudol.  I did call as per their request and discuss this with his daughter [name] and she verbalized understanding.  She knows to be awaiting a another phone call this afternoon from our office to arrange for flex sig. ? ?Ellouise Newer, PA-C ?

## 2021-08-23 NOTE — Progress Notes (Signed)
? ?Chief Complaint: History of rectal cancer ? ?HPI: ?   Isaac Phelps is an 88 year old African-American male with a past medical history as listed below including CVA on aspirin, adenocarcinoma of the rectum, known to Dr. Carlean Purl, who returns to clinic today for follow-up of his rectal cancer. ?   08/03/2020 colonoscopy with palpable rectal mass and digital exam the anterior and the rectum, 3 cm long, but finger width, likely malignant tumor in the distal rectum.  Pathology with adenocarcinoma with signet ring features. ?   12/08/2020 flex sigmoidoscopy was normal.  Pathology with benign mucosa. ?   08/03/2021 patient seen by oncology, at that time was doing well.  He has been treated with radiation and chemotherapy 09/12/2020-10/21/2020.  At that visit he was referred to [place] for repeat flex sigmoidoscopy. ?   Today, patient presents to clinic accompanied by his daughter [name] and tells me that he is doing very well.  He has no symptoms at all, having normal bowel movements, no blood in stool, no weight loss or other complaints.  Tells me he was sent here by his oncologist because they thought he needed another flex sig or anoscopy. ?   Denies fever, chills or abdominal pain. ? ?Past Medical History:  ?Diagnosis Date  ? Adenocarcinoma of rectum ([place])   ? Anxiety   ? BENIGN PROSTATIC HYPERTROPHY 04/01/2008  ? Coronary artery disease   ? GERD (gastroesophageal reflux disease)   ? HYPERLIPIDEMIA 05/26/2007  ? HYPERTENSION 02/07/2007  ? Syncope and collapse   ? TOBACCO ABUSE 04/01/2008  ? ? ?Past Surgical History:  ?Procedure Laterality Date  ? APPENDECTOMY    ? BIOPSY  08/03/2020  ? Procedure: BIOPSY;  Surgeon: Gatha Mayer, MD;  Location: [place];  Service: Endoscopy;;  ? COLONOSCOPY WITH PROPOFOL N/A 08/03/2020  ? Procedure: COLONOSCOPY WITH PROPOFOL;  Surgeon: Gatha Mayer, MD;  Location[facility] ENDOSCOPY;  Service: Endoscopy;  Laterality: N/A;  ? ESOPHAGOGASTRODUODENOSCOPY (EGD) WITH PROPOFOL N/A 08/03/2020  ?  Procedure: ESOPHAGOGASTRODUODENOSCOPY (EGD) WITH PROPOFOL;  Surgeon: Gatha Mayer, MD;  Location: [facility] ENDOSCOPY;  Service: Endoscopy;  Laterality: N/A;  ? TONSILLECTOMY    ? ? ?Current Outpatient Medications  ?Medication Sig Dispense Refill  ? acetaminophen (TYLENOL) 650 MG CR tablet Take 650 mg by mouth daily as needed for pain.    ? hydrALAZINE (APRESOLINE) 50 MG tablet Take 1 tablet (50 mg total) by mouth every 8 (eight) hours. 135 tablet 1  ? polyvinyl alcohol (LIQUIFILM TEARS) 1.4 % ophthalmic solution Place 1 drop into both eyes daily as needed for dry eyes.    ? simvastatin (ZOCOR) 40 MG tablet TAKE 1 TABLET(40 MG) BY MOUTH DAILY 90 tablet 1  ? tamsulosin (FLOMAX) 0.4 MG CAPS capsule Take 1 capsule (0.4 mg total) by mouth daily. Need physical exam for further refills 90 capsule 0  ? amLODipine (NORVASC) 10 MG tablet Take 1 tablet (10 mg total) by mouth daily. 90 tablet 1  ? carvedilol (COREG) 3.125 MG tablet Take 1 tablet (3.125 mg total) by mouth 2 (two) times daily with a meal. 180 tablet 1  ? ?Current Facility-Administered Medications  ?Medication Dose Route Frequency Provider Last Rate Last Admin  ? 0.9 %  sodium chloride infusion  500 mL Intravenous Once Gatha Mayer, MD      ? ? ?Allergies as of 08/23/2021  ? (No Known Allergies)  ? ? ?Family History  ?Problem Relation Age of Onset  ? Arthritis Neg Hx   ?  family  ? Hypertension Neg Hx   ?     family  ? Stroke Neg Hx   ?     family , 1st degree relative  ? Colon cancer Neg Hx   ? Colon polyps Neg Hx   ? Esophageal cancer Neg Hx   ? Rectal cancer Neg Hx   ? Stomach cancer Neg Hx   ? ? ?Social History  ? ?Socioeconomic History  ? Marital status: Widowed  ?  Spouse name: Not on file  ? Number of children: 1  ? Years of education: Not on file  ? Highest education level: Not on file  ?Occupational History  ? Not on file  ?Tobacco Use  ? Smoking status: Former  ?  Packs/day: 0.30  ?  Types: Cigarettes  ? Smokeless tobacco: Never  ?Vaping Use  ? Vaping  Use: Never used  ?Substance and Sexual Activity  ? Alcohol use: No  ? Drug use: No  ? Sexual activity: Not on file  ?Other Topics Concern  ? Not on file  ?Social History Narrative  ? Not on file  ? ?Social Determinants of Health  ? ?Financial Resource Strain: Low Risk   ? Difficulty of Paying Living Expenses: Not hard at all  ?Food Insecurity: No Food Insecurity  ? Worried About Charity fundraiser in the Last Year: Never true  ? Ran Out of Food in the Last Year: Never true  ?Transportation Needs: No Transportation Needs  ? Lack of Transportation (Medical): No  ? Lack of Transportation (Non-Medical): No  ?Physical Activity: Inactive  ? Days of Exercise per Week: 0 days  ? Minutes of Exercise per Session: 0 min  ?Stress: No Stress Concern Present  ? Feeling of Stress : Not at all  ?Social Connections: Moderately Integrated  ? Frequency of Communication with Friends and Family: Twice a week  ? Frequency of Social Gatherings with Friends and Family: Twice a week  ? Attends Religious Services: More than 4 times per year  ? Active Member of Clubs or Organizations: No  ? Attends Archivist Meetings: Never  ? Marital Status: Married  ?Intimate Partner Violence: Not At Risk  ? Fear of Current or Ex-Partner: No  ? Emotionally Abused: No  ? Physically Abused: No  ? Sexually Abused: No  ? ? ?Review of Systems:    ?Constitutional: No weight loss, fever or chills ?Cardiovascular: No chest pain ?Respiratory: No SOB  ?Gastrointestinal: See HPI and otherwise negative ? ? Physical Exam:  ?Vital signs: ?BP (!) 152/80 (BP Location: Right Arm, Patient Position: Sitting, Cuff Size: Normal)   Pulse 74   Ht 6' (1.829 m)   Wt 183 lb 4 oz (83.1 kg)   SpO2 99%   BMI 24.85 kg/m?  ? ?Constitutional:   Pleasant AA male appears to be in NAD, Well developed, Well nourished, alert and cooperative ?Respiratory: Respirations even and unlabored. Lungs clear to auscultation bilaterally.   No wheezes, crackles, or rhonchi.   ?Cardiovascular: Normal S1, S2. No MRG. Regular rate and rhythm. No peripheral edema, cyanosis or pallor.  ?Gastrointestinal:  Soft, nondistended, nontender. No rebound or guarding. Normal bowel sounds. No appreciable masses or hepatomegaly. ?Rectal:  Not performed.  ?Psychiatric: Oriented to person, place and time. Demonstrates good judgement and reason without abnormal affect or behaviors. ? ?RELEVANT LABS AND IMAGING: ?CBC ?   ?Component Value Date/Time  ? WBC 5.2 08/03/2021 1031  ? WBC 6.0 08/03/2020 0546  ? RBC 3.78 (L)  08/03/2021 1031  ? HGB 9.2 (L) 08/03/2021 1031  ? HCT 30.3 (L) 08/03/2021 1031  ? PLT 270 08/03/2021 1031  ? MCV 80.2 08/03/2021 1031  ? MCH 24.3 (L) 08/03/2021 1031  ? MCHC 30.4 08/03/2021 1031  ? RDW 16.6 (H) 08/03/2021 1031  ? LYMPHSABS 0.4 (L) 08/03/2021 1031  ? MONOABS 0.4 08/03/2021 1031  ? EOSABS 0.1 08/03/2021 1031  ? BASOSABS 0.0 08/03/2021 1031  ? ? ?CMP  ?   ?Component Value Date/Time  ? NA 140 10/21/2020 1322  ? K 4.3 10/21/2020 1322  ? CL 107 10/21/2020 1322  ? CO2 23 10/21/2020 1322  ? GLUCOSE 106 (H) 10/21/2020 1322  ? GLUCOSE 102 (H) 03/22/2006 0959  ? BUN 31 (H) 10/21/2020 1322  ? CREATININE 2.05 (H) 10/21/2020 1322  ? CALCIUM 8.8 (L) 10/21/2020 1322  ? PROT 7.5 10/21/2020 1322  ? ALBUMIN 4.1 10/21/2020 1322  ? AST 35 10/21/2020 1322  ? ALT 11 10/21/2020 1322  ? ALKPHOS 51 10/21/2020 1322  ? BILITOT 0.4 10/21/2020 1322  ? GFRNONAA 31 (L) 10/21/2020 1322  ? GFRAA 42 (L) 02/14/2015 0443  ? ? ?Assessment: ?1.  History of rectal adenocarcinoma: Status post radiation and chemotherapy 09/12/2020-10/21/2020, follow-up flex sig 12/08/2020 was normal with benign colonic mucosa on biopsy ? ?Plan: ?1.  Per Dr. Carlean Purl there was no recall needed after last flex sig colonoscopy.  Recent oncology visit with Ned Card, NP patient was referred to [place] for repeat flex sig.  Discussed with patient and his daughter that I will confirm with Dr. Carlean Purl the need for repeat flex sigmoidoscopy at this  point.  Patient is willing to have another if we recommend, but otherwise would prefer not to. ?2.  Patient to follow in clinic per recommendations after discussion with Dr. Carlean Purl as above. ? ?Ellouise Newer, PA-C ?[place]

## 2021-09-27 ENCOUNTER — Encounter: Payer: Self-pay | Admitting: Adult Health

## 2021-09-27 ENCOUNTER — Ambulatory Visit (INDEPENDENT_AMBULATORY_CARE_PROVIDER_SITE_OTHER): Payer: Medicare Other | Admitting: Adult Health

## 2021-09-27 VITALS — BP 160/60 | HR 62 | Temp 97.9°F | Ht 72.0 in | Wt 183.0 lb

## 2021-09-27 DIAGNOSIS — M159 Polyosteoarthritis, unspecified: Secondary | ICD-10-CM

## 2021-09-27 DIAGNOSIS — I10 Essential (primary) hypertension: Secondary | ICD-10-CM | POA: Diagnosis not present

## 2021-09-27 MED ORDER — AMLODIPINE BESYLATE 10 MG PO TABS: 10.0000 mg | ORAL_TABLET | Freq: Every day | ORAL | 0 refills | Status: DC

## 2021-09-27 MED ORDER — CARVEDILOL 3.125 MG PO TABS: 3.1250 mg | ORAL_TABLET | Freq: Two times a day (BID) | ORAL | 0 refills | Status: DC

## 2021-09-27 NOTE — Progress Notes (Signed)
? ?Subjective:  ? ? Patient ID: Isaac Phelps, male    DOB: 08-19-1930, 88 y.o.   MRN: [id number] ? ?HPI ?88 year old male who presents to the office today to get a handicap placard.  He has chronic pain due to arthritis and cannot walk without a cane and has a hard time walking distances greater than 200 feet. ? ?On exam it was noted that his blood pressure was elevated.  He has only been taking his hydroxyzine and has been out of Coreg and Norvasc for roughly 3 months. ? ?He is coming back next month for his annual exam. ? ?Review of Systems ?See HPI  ? ?Past Medical History:  ?Diagnosis Date  ? Adenocarcinoma of rectum ([place])   ? Anxiety   ? BENIGN PROSTATIC HYPERTROPHY 04/01/2008  ? Coronary artery disease   ? GERD (gastroesophageal reflux disease)   ? HYPERLIPIDEMIA 05/26/2007  ? HYPERTENSION 02/07/2007  ? Syncope and collapse   ? TOBACCO ABUSE 04/01/2008  ? ? ?Social History  ? ?Socioeconomic History  ? Marital status: Widowed  ?  Spouse name: Not on file  ? Number of children: 1  ? Years of education: Not on file  ? Highest education level: Not on file  ?Occupational History  ? Not on file  ?Tobacco Use  ? Smoking status: Former  ?  Packs/day: 0.30  ?  Types: Cigarettes  ? Smokeless tobacco: Never  ?Vaping Use  ? Vaping Use: Never used  ?Substance and Sexual Activity  ? Alcohol use: No  ? Drug use: No  ? Sexual activity: Not on file  ?Other Topics Concern  ? Not on file  ?Social History Narrative  ? Not on file  ? ?Social Determinants of Health  ? ?Financial Resource Strain: Low Risk   ? Difficulty of Paying Living Expenses: Not hard at all  ?Food Insecurity: No Food Insecurity  ? Worried About Charity fundraiser in the Last Year: Never true  ? Ran Out of Food in the Last Year: Never true  ?Transportation Needs: No Transportation Needs  ? Lack of Transportation (Medical): No  ? Lack of Transportation (Non-Medical): No  ?Physical Activity: Inactive  ? Days of Exercise per Week: 0 days  ? Minutes of Exercise per  Session: 0 min  ?Stress: No Stress Concern Present  ? Feeling of Stress : Not at all  ?Social Connections: Moderately Integrated  ? Frequency of Communication with Friends and Family: Twice a week  ? Frequency of Social Gatherings with Friends and Family: Twice a week  ? Attends Religious Services: More than 4 times per year  ? Active Member of Clubs or Organizations: No  ? Attends Archivist Meetings: Never  ? Marital Status: Married  ?Intimate Partner Violence: Not At Risk  ? Fear of Current or Ex-Partner: No  ? Emotionally Abused: No  ? Physically Abused: No  ? Sexually Abused: No  ? ? ?Past Surgical History:  ?Procedure Laterality Date  ? APPENDECTOMY    ? BIOPSY  08/03/2020  ? Procedure: BIOPSY;  Surgeon: Gatha Mayer, MD;  Location: [place];  Service: Endoscopy;;  ? COLONOSCOPY WITH PROPOFOL N/A 08/03/2020  ? Procedure: COLONOSCOPY WITH PROPOFOL;  Surgeon: Gatha Mayer, MD;  Location: [facility] ENDOSCOPY;  Service: Endoscopy;  Laterality: N/A;  ? ESOPHAGOGASTRODUODENOSCOPY (EGD) WITH PROPOFOL N/A 08/03/2020  ? Procedure: ESOPHAGOGASTRODUODENOSCOPY (EGD) WITH PROPOFOL;  Surgeon: Gatha Mayer, MD;  Location: [facility] ENDOSCOPY;  Service: Endoscopy;  Laterality: N/A;  ? TONSILLECTOMY    ? ? ?  Family History  ?Problem Relation Age of Onset  ? Arthritis Neg Hx   ?     family  ? Hypertension Neg Hx   ?     family  ? Stroke Neg Hx   ?     family , 1st degree relative  ? Colon cancer Neg Hx   ? Colon polyps Neg Hx   ? Esophageal cancer Neg Hx   ? Rectal cancer Neg Hx   ? Stomach cancer Neg Hx   ? ? ?No Known Allergies ? ?Current Outpatient Medications on File Prior to Visit  ?Medication Sig Dispense Refill  ? acetaminophen (TYLENOL) 650 MG CR tablet Take 650 mg by mouth daily as needed for pain.    ? hydrALAZINE (APRESOLINE) 50 MG tablet Take 1 tablet (50 mg total) by mouth every 8 (eight) hours. 135 tablet 1  ? polyvinyl alcohol (LIQUIFILM TEARS) 1.4 % ophthalmic solution Place 1 drop into both eyes daily as  needed for dry eyes.    ? simvastatin (ZOCOR) 40 MG tablet TAKE 1 TABLET(40 MG) BY MOUTH DAILY 90 tablet 1  ? tamsulosin (FLOMAX) 0.4 MG CAPS capsule Take 1 capsule (0.4 mg total) by mouth daily. Need physical exam for further refills 90 capsule 0  ? ?Current Facility-Administered Medications on File Prior to Visit  ?Medication Dose Route Frequency Provider Last Rate Last Admin  ? 0.9 %  sodium chloride infusion  500 mL Intravenous Once Gatha Mayer, MD      ? ? ?BP (!) 160/60   Pulse 62   Temp 97.9 ?F (36.6 ?C) (Oral)   Ht 6' (1.829 m)   Wt 183 lb (83 kg)   SpO2 100%   BMI 24.82 kg/m?  ? ? ?   ?Objective:  ? Physical Exam ?Vitals and nursing note reviewed.  ?Constitutional:   ?   Appearance: Normal appearance.  ?Cardiovascular:  ?   Rate and Rhythm: Normal rate and regular rhythm.  ?   Pulses: Normal pulses.  ?   Heart sounds: Normal heart sounds.  ?Pulmonary:  ?   Effort: Pulmonary effort is normal.  ?   Breath sounds: Normal breath sounds.  ?Musculoskeletal:     ?   General: Normal range of motion.  ?Skin: ?   General: Skin is warm and dry.  ?Neurological:  ?   General: No focal deficit present.  ?   Mental Status: He is alert and oriented to person, place, and time.  ?   Gait: Gait abnormal (slow steady gait with cane).  ?Psychiatric:     ?   Mood and Affect: Mood normal.     ?   Behavior: Behavior normal.     ?   Thought Content: Thought content normal.  ? ?   ?Assessment & Plan:  ?1. Primary osteoarthritis involving multiple joints ?- Handicap form given  ? ?2. Essential hypertension ?- Will refill his medication and reevaluate at CPE next month  ?- amLODipine (NORVASC) 10 MG tablet; Take 1 tablet (10 mg total) by mouth daily.  Dispense: 90 tablet; Refill: 0 ?- carvedilol (COREG) 3.125 MG tablet; Take 1 tablet (3.125 mg total) by mouth 2 (two) times daily with a meal.  Dispense: 180 tablet; Refill: 0 ? ?Dorothyann Peng, NP ? ?

## 2021-09-27 NOTE — Patient Instructions (Signed)
Please follow up next month ( May) for your annual exam  ? ?I have sent in your blood pressure medication that you are out of  ?

## 2021-10-09 ENCOUNTER — Other Ambulatory Visit: Payer: Medicare Other | Admitting: Internal Medicine

## 2021-11-01 ENCOUNTER — Ambulatory Visit (INDEPENDENT_AMBULATORY_CARE_PROVIDER_SITE_OTHER): Payer: Medicare Other | Admitting: Adult Health

## 2021-11-01 VITALS — BP 180/90 | HR 70 | Temp 98.4°F | Ht 69.0 in | Wt 183.0 lb

## 2021-11-01 DIAGNOSIS — N400 Enlarged prostate without lower urinary tract symptoms: Secondary | ICD-10-CM

## 2021-11-01 DIAGNOSIS — M170 Bilateral primary osteoarthritis of knee: Secondary | ICD-10-CM | POA: Diagnosis not present

## 2021-11-01 DIAGNOSIS — I10 Essential (primary) hypertension: Secondary | ICD-10-CM | POA: Diagnosis not present

## 2021-11-01 DIAGNOSIS — E785 Hyperlipidemia, unspecified: Secondary | ICD-10-CM | POA: Diagnosis not present

## 2021-11-01 LAB — COMPREHENSIVE METABOLIC PANEL
ALT: 11 U/L (ref 0–53)
AST: 28 U/L (ref 0–37)
Albumin: 4.1 g/dL (ref 3.5–5.2)
Alkaline Phosphatase: 107 U/L (ref 39–117)
BUN: 34 mg/dL — ABNORMAL HIGH (ref 6–23)
CO2: 24 mEq/L (ref 19–32)
Calcium: 9.2 mg/dL (ref 8.4–10.5)
Chloride: 105 mEq/L (ref 96–112)
Creatinine, Ser: 2.27 mg/dL — ABNORMAL HIGH (ref 0.40–1.50)
GFR: 25.08 mL/min — ABNORMAL LOW (ref >60.00)
Glucose, Bld: 109 mg/dL — ABNORMAL HIGH (ref 70–99)
Potassium: 4.4 mEq/L (ref 3.5–5.1)
Sodium: 136 mEq/L (ref 135–145)
Total Bilirubin: 0.4 mg/dL (ref 0.2–1.2)
Total Protein: 8 g/dL (ref 6.0–8.3)

## 2021-11-01 LAB — URINALYSIS, ROUTINE W REFLEX MICROSCOPIC
Bilirubin Urine: NEGATIVE
Hgb urine dipstick: NEGATIVE
Ketones, ur: NEGATIVE
Leukocytes,Ua: NEGATIVE
Nitrite: NEGATIVE
RBC / HPF: NONE SEEN (ref 0–?)
Specific Gravity, Urine: 1.02 (ref 1.000–1.030)
Total Protein, Urine: 30 — AB
Urine Glucose: NEGATIVE
Urobilinogen, UA: 1 (ref 0.0–1.0)
pH: 6 (ref 5.0–8.0)

## 2021-11-01 LAB — CBC WITH DIFFERENTIAL/PLATELET
Basophils Absolute: 0 10*3/uL (ref 0.0–0.1)
Basophils Relative: 0.8 % (ref 0.0–3.0)
Eosinophils Absolute: 0.1 10*3/uL (ref 0.0–0.7)
Eosinophils Relative: 1.8 % (ref 0.0–5.0)
HCT: 28 % — ABNORMAL LOW (ref 39.0–52.0)
Hemoglobin: 8.9 g/dL — ABNORMAL LOW (ref 13.0–17.0)
Lymphocytes Relative: 7.8 % — ABNORMAL LOW (ref 12.0–46.0)
Lymphs Abs: 0.5 10*3/uL — ABNORMAL LOW (ref 0.7–4.0)
MCHC: 31.9 g/dL (ref 30.0–36.0)
MCV: 75.6 fl — ABNORMAL LOW (ref 78.0–100.0)
Monocytes Absolute: 0.5 10*3/uL (ref 0.1–1.0)
Monocytes Relative: 7.8 % (ref 3.0–12.0)
Neutro Abs: 5.1 10*3/uL (ref 1.4–7.7)
Neutrophils Relative %: 81.8 % — ABNORMAL HIGH (ref 43.0–77.0)
Platelets: 250 10*3/uL (ref 150.0–400.0)
RBC: 3.7 Mil/uL — ABNORMAL LOW (ref 4.22–5.81)
RDW: 17.4 % — ABNORMAL HIGH (ref 11.5–15.5)
WBC: 6.2 10*3/uL (ref 4.0–10.5)

## 2021-11-01 LAB — TSH: TSH: 2.2 u[IU]/mL (ref 0.35–5.50)

## 2021-11-01 MED ORDER — HYDRALAZINE HCL 100 MG PO TABS: 100.0000 mg | ORAL_TABLET | Freq: Three times a day (TID) | ORAL | 1 refills | Status: DC

## 2021-11-01 NOTE — Patient Instructions (Signed)
It was great seeing you today   We will follow up with you regarding your blood work   I have increased your hydralazine from 50 mg three times a day to 100 mg three times a day   Please follow up in 2-3 weeks

## 2021-11-01 NOTE — Progress Notes (Signed)
Subjective:    Patient ID: Isaac Phelps, male    DOB: 1930-09-30, 88 y.o.   MRN: [id number]  HPI Patient presents for yearly preventative medicine examination. He is a pleasant 88 year old male who  has a past medical history of Adenocarcinoma of rectum ([place]), Anxiety, BENIGN PROSTATIC HYPERTROPHY (04/01/2008), Coronary artery disease, GERD (gastroesophageal reflux disease), HYPERLIPIDEMIA (05/26/2007), HYPERTENSION (02/07/2007), Syncope and collapse, and TOBACCO ABUSE (04/01/2008).  HTN -currently managed with Norvasc 10 mg daily, Coreg 3.125.twice daily, and hydralazine 50 mg 3 times daily.  He does not check his blood pressures at home. He does report that he took his blood pressure medication this morning. He does take it most days but will forget a " few times a week". He denies symptoms of hypertension.  BP Readings from Last 3 Encounters:  11/01/21 (!) 180/90  09/27/21 (!) 160/60  08/23/21 (!) 152/80   Hyperlipidemia -takes simvastatin 40 mg daily.  He denies myalgia or fatigue Lab Results  Component Value Date   CHOL 187 08/01/2020   HDL 34 (L) 08/01/2020   LDLCALC 134 (H) 08/01/2020   LDLDIRECT 151.4 05/26/2007   TRIG 94 08/01/2020   CHOLHDL 5.5 08/01/2020   BPH -managed with Flomax 0.4 mg daily.  He feels as though his symptoms are well controlled on this dose  Osteoarthritis - bilateral knees. He uses voltaren gel which works well for him    All immunizations and health maintenance protocols were reviewed with the patient and needed orders were placed.  Appropriate screening laboratory values were ordered for the patient including screening of hyperlipidemia, renal function and hepatic function. If indicated by BPH, a PSA was ordered.  Medication reconciliation,  past medical history, social history, problem list and allergies were reviewed in detail with the patient  Goals were established with regard to weight loss, exercise, and  diet in compliance with  medications   Review of Systems  Constitutional: Negative.   HENT: Negative.    Eyes: Negative.   Respiratory: Negative.    Cardiovascular: Negative.   Gastrointestinal: Negative.   Endocrine: Negative.   Genitourinary: Negative.   Musculoskeletal:  Positive for arthralgias (bilateral knees).  Skin: Negative.   Allergic/Immunologic: Negative.   Neurological: Negative.   Hematological: Negative.   Psychiatric/Behavioral: Negative.    All other systems reviewed and are negative.  Past Medical History:  Diagnosis Date   Adenocarcinoma of rectum ([place])    Anxiety    BENIGN PROSTATIC HYPERTROPHY 04/01/2008   Coronary artery disease    GERD (gastroesophageal reflux disease)    HYPERLIPIDEMIA 05/26/2007   HYPERTENSION 02/07/2007   Syncope and collapse    TOBACCO ABUSE 04/01/2008    Social History   Socioeconomic History   Marital status: Widowed    Spouse name: Not on file   Number of children: 1   Years of education: Not on file   Highest education level: Not on file  Occupational History   Not on file  Tobacco Use   Smoking status: Former    Packs/day: 0.30    Types: Cigarettes   Smokeless tobacco: Never  Vaping Use   Vaping Use: Never used  Substance and Sexual Activity   Alcohol use: No   Drug use: No   Sexual activity: Not on file  Other Topics Concern   Not on file  Social History Narrative   Not on file   Social Determinants of Health   Financial Resource Strain: Low Risk    Difficulty of  Paying Living Expenses: Not hard at all  Food Insecurity: No Food Insecurity   Worried About [place] in the Last Year: Never true   Ran Out of Food in the Last Year: Never true  Transportation Needs: No Transportation Needs   Lack of Transportation (Medical): No   Lack of Transportation (Non-Medical): No  Physical Activity: Inactive   Days of Exercise per Week: 0 days   Minutes of Exercise per Session: 0 min  Stress: No Stress Concern Present    Feeling of Stress : Not at all  Social Connections: Moderately Integrated   Frequency of Communication with Friends and Family: Twice a week   Frequency of Social Gatherings with Friends and Family: Twice a week   Attends Religious Services: More than 4 times per year   Active Member of [employer] or Organizations: No   Attends Archivist Meetings: Never   Marital Status: Married  Human resources officer Violence: Not At Risk   Fear of Current or Ex-Partner: No   Emotionally Abused: No   Physically Abused: No   Sexually Abused: No    Past Surgical History:  Procedure Laterality Date   APPENDECTOMY     BIOPSY  08/03/2020   Procedure: BIOPSY;  Surgeon: Gatha Mayer, MD;  Location: [facility];  Service: Endoscopy;;   COLONOSCOPY WITH PROPOFOL N/A 08/03/2020   Procedure: COLONOSCOPY WITH PROPOFOL;  Surgeon: Gatha Mayer, MD;  Location: [facility];  Service: Endoscopy;  Laterality: N/A;   ESOPHAGOGASTRODUODENOSCOPY (EGD) WITH PROPOFOL N/A 08/03/2020   Procedure: ESOPHAGOGASTRODUODENOSCOPY (EGD) WITH PROPOFOL;  Surgeon: Gatha Mayer, MD;  Location: [facility];  Service: Endoscopy;  Laterality: N/A;   TONSILLECTOMY      Family History  Problem Relation Age of Onset   Arthritis Neg Hx        family   Hypertension Neg Hx        family   Stroke Neg Hx        family , 1st degree relative   Colon cancer Neg Hx    Colon polyps Neg Hx    Esophageal cancer Neg Hx    Rectal cancer Neg Hx    Stomach cancer Neg Hx     No Known Allergies  Current Outpatient Medications on File Prior to Visit  Medication Sig Dispense Refill   acetaminophen (TYLENOL) 650 MG CR tablet Take 650 mg by mouth daily as needed for pain.     amLODipine (NORVASC) 10 MG tablet Take 1 tablet (10 mg total) by mouth daily. 90 tablet 0   carvedilol (COREG) 3.125 MG tablet Take 1 tablet (3.125 mg total) by mouth 2 (two) times daily with a meal. 180 tablet 0   polyvinyl alcohol (LIQUIFILM TEARS) 1.4 % ophthalmic  solution Place 1 drop into both eyes daily as needed for dry eyes.     simvastatin (ZOCOR) 40 MG tablet TAKE 1 TABLET(40 MG) BY MOUTH DAILY 90 tablet 1   tamsulosin (FLOMAX) 0.4 MG CAPS capsule Take 1 capsule (0.4 mg total) by mouth daily. Need physical exam for further refills 90 capsule 0   Current Facility-Administered Medications on File Prior to Visit  Medication Dose Route Frequency Provider Last Rate Last Admin   0.9 %  sodium chloride infusion  500 mL Intravenous Once Gatha Mayer, MD        BP (!) 180/90 (BP Location: Left Arm, Patient Position: Sitting)   Pulse 70   Temp 98.4 F (36.9 C) (Oral)  Ht '5\' 9"'$  (1.753 m)   Wt 183 lb (83 kg)   SpO2 98%   BMI 27.02 kg/m       Objective:   Physical Exam Vitals and nursing note reviewed.  Constitutional:      General: He is not in acute distress.    Appearance: Normal appearance. He is well-developed and normal weight.  HENT:     Head: Normocephalic and atraumatic.     Right Ear: Tympanic membrane, ear canal and external ear normal. There is no impacted cerumen.     Left Ear: Tympanic membrane, ear canal and external ear normal. There is no impacted cerumen.     Nose: Nose normal. No congestion or rhinorrhea.     Mouth/Throat:     Mouth: Mucous membranes are moist.     Pharynx: Oropharynx is clear. No oropharyngeal exudate or posterior oropharyngeal erythema.  Eyes:     General:        Right eye: No discharge.        Left eye: No discharge.     Extraocular Movements: Extraocular movements intact.     Conjunctiva/sclera: Conjunctivae normal.     Pupils: Pupils are equal, round, and reactive to light.  Neck:     Vascular: No carotid bruit.     Trachea: No tracheal deviation.  Cardiovascular:     Rate and Rhythm: Normal rate and regular rhythm.     Pulses: Normal pulses.     Heart sounds: Normal heart sounds. No murmur heard.   No friction rub. No gallop.  Pulmonary:     Effort: Pulmonary effort is normal. No  respiratory distress.     Breath sounds: Normal breath sounds. No stridor. No wheezing, rhonchi or rales.  Chest:     Chest wall: No tenderness.  Abdominal:     General: Bowel sounds are normal. There is no distension.     Palpations: Abdomen is soft. There is no mass.     Tenderness: There is no abdominal tenderness. There is no right CVA tenderness, left CVA tenderness, guarding or rebound.     Hernia: No hernia is present.  Musculoskeletal:        General: No swelling, tenderness, deformity or signs of injury. Normal range of motion.     Right lower leg: No edema.     Left lower leg: No edema.  Lymphadenopathy:     Cervical: No cervical adenopathy.  Skin:    General: Skin is warm and dry.     Capillary Refill: Capillary refill takes less than 2 seconds.     Coloration: Skin is not jaundiced or pale.     Findings: No bruising, erythema, lesion or rash.  Neurological:     General: No focal deficit present.     Mental Status: He is alert and oriented to person, place, and time.     Cranial Nerves: No cranial nerve deficit.     Sensory: No sensory deficit.     Motor: No weakness.     Coordination: Coordination normal.     Gait: Gait normal.     Deep Tendon Reflexes: Reflexes normal.  Psychiatric:        Mood and Affect: Mood normal.        Behavior: Behavior normal.        Thought Content: Thought content normal.        Judgment: Judgment normal.      Assessment & Plan:  1. Essential hypertension - Not at goal despite  taking his medications today. Will increase Hydralazine to 100 mg TID. Stressed importance of taking his medication daily.  - Follow up in 2 weeks  - Urinalysis; Future - CBC with Differential/Platelet; Future - Comprehensive metabolic panel; Future - TSH; Future - hydrALAZINE (APRESOLINE) 100 MG tablet; Take 1 tablet (100 mg total) by mouth every 8 (eight) hours.  Dispense: 270 tablet; Refill: 1 - TSH - Comprehensive metabolic panel - CBC with  Differential/Platelet - Urinalysis  2. Benign prostatic hyperplasia without lower urinary tract symptoms - Continue Flomax  - CBC with Differential/Platelet; Future - Comprehensive metabolic panel; Future - TSH; Future - TSH - Comprehensive metabolic panel - CBC with Differential/Platelet  3. Dyslipidemia - Is not fasting today. Will keep statin at current dose  - CBC with Differential/Platelet; Future - Comprehensive metabolic panel; Future - TSH; Future - TSH - Comprehensive metabolic panel - CBC with Differential/Platelet  4. Primary osteoarthritis of both knees - Discussed knee injections with the patient and he refuses at this time   Dorothyann Peng, NP

## 2021-11-09 ENCOUNTER — Other Ambulatory Visit: Payer: Self-pay

## 2021-11-09 ENCOUNTER — Inpatient Hospital Stay (HOSPITAL_BASED_OUTPATIENT_CLINIC_OR_DEPARTMENT_OTHER): Payer: Medicare Other | Admitting: Oncology

## 2021-11-09 ENCOUNTER — Inpatient Hospital Stay: Payer: Medicare Other | Attending: Radiation Oncology

## 2021-11-09 VITALS — BP 177/68 | HR 64 | Temp 98.3°F | Resp 16 | Wt 183.4 lb

## 2021-11-09 DIAGNOSIS — C20 Malignant neoplasm of rectum: Secondary | ICD-10-CM | POA: Diagnosis not present

## 2021-11-09 DIAGNOSIS — I10 Essential (primary) hypertension: Secondary | ICD-10-CM | POA: Insufficient documentation

## 2021-11-09 DIAGNOSIS — N400 Enlarged prostate without lower urinary tract symptoms: Secondary | ICD-10-CM | POA: Diagnosis not present

## 2021-11-09 DIAGNOSIS — Z79899 Other long term (current) drug therapy: Secondary | ICD-10-CM | POA: Diagnosis not present

## 2021-11-09 DIAGNOSIS — D509 Iron deficiency anemia, unspecified: Secondary | ICD-10-CM | POA: Diagnosis present

## 2021-11-09 DIAGNOSIS — Z85048 Personal history of other malignant neoplasm of rectum, rectosigmoid junction, and anus: Secondary | ICD-10-CM | POA: Insufficient documentation

## 2021-11-09 DIAGNOSIS — I2510 Atherosclerotic heart disease of native coronary artery without angina pectoris: Secondary | ICD-10-CM | POA: Insufficient documentation

## 2021-11-09 DIAGNOSIS — M109 Gout, unspecified: Secondary | ICD-10-CM | POA: Insufficient documentation

## 2021-11-09 DIAGNOSIS — E538 Deficiency of other specified B group vitamins: Secondary | ICD-10-CM | POA: Insufficient documentation

## 2021-11-09 LAB — CBC WITH DIFFERENTIAL (CANCER CENTER ONLY)
Abs Immature Granulocytes: 0.02 10*3/uL (ref 0.00–0.07)
Basophils Absolute: 0 10*3/uL (ref 0.0–0.1)
Basophils Relative: 1 %
Eosinophils Absolute: 0.1 10*3/uL (ref 0.0–0.5)
Eosinophils Relative: 2 %
HCT: 29 % — ABNORMAL LOW (ref 39.0–52.0)
Hemoglobin: 8.7 g/dL — ABNORMAL LOW (ref 13.0–17.0)
Immature Granulocytes: 0 %
Lymphocytes Relative: 7 %
Lymphs Abs: 0.4 10*3/uL — ABNORMAL LOW (ref 0.7–4.0)
MCH: 23.2 pg — ABNORMAL LOW (ref 26.0–34.0)
MCHC: 30 g/dL (ref 30.0–36.0)
MCV: 77.3 fL — ABNORMAL LOW (ref 80.0–100.0)
Monocytes Absolute: 0.4 10*3/uL (ref 0.1–1.0)
Monocytes Relative: 7 %
Neutro Abs: 4.8 10*3/uL (ref 1.7–7.7)
Neutrophils Relative %: 83 %
Platelet Count: 257 10*3/uL (ref 150–400)
RBC: 3.75 MIL/uL — ABNORMAL LOW (ref 4.22–5.81)
RDW: 17.6 % — ABNORMAL HIGH (ref 11.5–15.5)
WBC Count: 5.8 10*3/uL (ref 4.0–10.5)
nRBC: 0 % (ref 0.0–0.2)

## 2021-11-09 LAB — CEA (ACCESS): CEA (CHCC): 4.65 ng/mL (ref 0.00–5.00)

## 2021-11-09 NOTE — Progress Notes (Signed)
[facility] OFFICE PROGRESS NOTE   Diagnosis: Rectal cancer  INTERVAL HISTORY:   Mr. Crass returns as scheduled.  He feels well.  No difficulty with bowel function.  No bleeding.  He is not taking iron.  He has noted a pruritic dark area at the right upper back.  Objective:  Vital signs in last 24 hours:  Blood pressure (!) 177/68, pulse 64, temperature 98.3 F (36.8 C), temperature source Oral, resp. rate 16, weight 183 lb 6.4 oz (83.2 kg), SpO2 99 %.    Lymphatics: No cervical, supraclavicular, axillary, or inguinal nodes Resp: Distant breath sounds, no respiratory distress Cardio: Regular rate and rhythm GI: Nontender, no hepatosplenomegaly, no mass Vascular: No leg edema  Skin: 5-6 cm slightly raised brown oval lesion at the right upper back  Lab Results:  Lab Results  Component Value Date   WBC 5.8 11/09/2021   HGB 8.7 (L) 11/09/2021   HCT 29.0 (L) 11/09/2021   MCV 77.3 (L) 11/09/2021   PLT 257 11/09/2021   NEUTROABS 4.8 11/09/2021    CMP  Lab Results  Component Value Date   NA 136 11/01/2021   K 4.4 11/01/2021   CL 105 11/01/2021   CO2 24 11/01/2021   GLUCOSE 109 (H) 11/01/2021   BUN 34 (H) 11/01/2021   CREATININE 2.27 (H) 11/01/2021   CALCIUM 9.2 11/01/2021   PROT 8.0 11/01/2021   ALBUMIN 4.1 11/01/2021   AST 28 11/01/2021   ALT 11 11/01/2021   ALKPHOS 107 11/01/2021   BILITOT 0.4 11/01/2021   GFRNONAA 31 (L) 10/21/2020   GFRAA 42 (L) 02/14/2015    Lab Results  Component Value Date   CEA1 3.59 02/28/2021   CEA 4.97 08/03/2021    Medications: I have reviewed the patient's current medications.   Assessment/Plan: Rectal cancer Colonoscopy 08/03/2020-distal rectal mass abutting the anus, biopsy confirmed adenocarcinoma CT angiography 07/29/2020-hazy soft tissue at the porta hepatis encasing the right hepatic artery, exophytic soft tissue lesion at the upper pole of the left kidney-indeterminate, partially solid nodule in the left  lower lobe-present on a CT in 2013 suspicious, suspicious for a low-grade pulmonary neoplasm, enlarged prostate, MRI abdomen 07/30/2020-no evidence of mass in the porta hepatis, hypoenhancing lesions in the upper pole of the left kidney and interpolar region of the right kidney suspicious for small neoplasms, hemosiderosis of the liver and spleen MRI pelvis 08/17/2020-T2, N0; minimal if any distance from tumor to internal anal sphincter Radiation and capecitabine 09/12/2020-10/21/2020 Sigmoidoscopy 12/08/2020-normal rectum and sigmoid, no tumor seen, slight mucosal edema and loss of vascular pattern in the distal rectum, biopsy-benign colonic mucosa, no malignancy Enlarged prostate with an elevated PSA CVAs confirmed on brain MRI 07/31/2020-maintained on aspirin Hypertension Gout History of coronary artery disease Iron deficiency anemia-Feraheme 07/30/2020 Ferrous sulfate 11/09/2021 B12 deficiency-07/31/2020 B12 161, vitamin B12 therapy resumed 12/19/2020      Disposition: Mr. Henegar remains in clinical remission from rectal cancer.  He does not wish to undergo sigmoidoscopy surveillance unless he develops recurrent rectal bleeding. He has persistent microcytic anemia, likely secondary to iron deficiency.  He will begin ferrous sulfate twice daily.  He will use a stool softener and MiraLAX as needed for constipation while on iron.  I recommended he return for a CBC in 6 weeks.  He does not wish to return for 3 months.  He will call for symptoms of anemia.  He will follow-up with his primary provider to evaluate the hyperpigmented plaque at the right upper back.  I doubt this is related to the history of rectal cancer.  Betsy Coder, MD  11/09/2021  11:26 AM

## 2021-11-10 ENCOUNTER — Other Ambulatory Visit (HOSPITAL_BASED_OUTPATIENT_CLINIC_OR_DEPARTMENT_OTHER): Payer: Self-pay

## 2021-11-10 ENCOUNTER — Encounter: Payer: Self-pay | Admitting: *Deleted

## 2021-11-10 DIAGNOSIS — C20 Malignant neoplasm of rectum: Secondary | ICD-10-CM

## 2021-11-10 LAB — FERRITIN: Ferritin: 12 ng/mL — ABNORMAL LOW (ref 24–336)

## 2021-11-10 NOTE — Progress Notes (Unsigned)
Call to lab to add ferritin to samples collected on 11/09/21-not able to add on.

## 2021-11-15 ENCOUNTER — Encounter: Payer: Self-pay | Admitting: Adult Health

## 2021-11-22 ENCOUNTER — Encounter: Payer: Self-pay | Admitting: Adult Health

## 2021-11-22 ENCOUNTER — Ambulatory Visit (INDEPENDENT_AMBULATORY_CARE_PROVIDER_SITE_OTHER): Payer: Medicare Other | Admitting: Adult Health

## 2021-11-22 VITALS — BP 130/60 | HR 65 | Temp 97.8°F | Ht 69.0 in | Wt 182.0 lb

## 2021-11-22 DIAGNOSIS — I10 Essential (primary) hypertension: Secondary | ICD-10-CM

## 2021-11-22 NOTE — Progress Notes (Signed)
Subjective:    Patient ID: Isaac Phelps, male    DOB: [birth year], 88 y.o.   MRN: [id number]  HPI 88 year old male who  has a past medical history of Adenocarcinoma of rectum ([place]), Anxiety, BENIGN PROSTATIC HYPERTROPHY (04/01/2008), Coronary artery disease, GERD (gastroesophageal reflux disease), HYPERLIPIDEMIA (05/26/2007), HYPERTENSION (02/07/2007), Syncope and collapse, and TOBACCO ABUSE (04/01/2008).  He presents to the office today for 3-week follow-up regarding hypertension.  He is currently managed with Norvasc 10 mg daily, Coreg 3.125 mg twice daily and hydralazine 100 mg 3 times daily.  When he was last seen in the office on 11/01/2021 his blood pressure 180/90.  At this time hydralazine was increased from 50 to 100 mg 3 times daily.  He did report that he takes his blood pressure medication most days but will forget "a few times a week".  He was asymptomatic in the office.  Today his blood pressure is improved. He is not having any side effects such as dizziness, lightheadedness, blurred vision, or syncope.   BP Readings from Last 3 Encounters:  11/22/21 130/60  11/09/21 (!) 177/68  11/01/21 (!) 180/90    Review of Systems See HPI   Past Medical History:  Diagnosis Date   Adenocarcinoma of rectum ([place])    Anxiety    BENIGN PROSTATIC HYPERTROPHY 04/01/2008   Coronary artery disease    GERD (gastroesophageal reflux disease)    HYPERLIPIDEMIA 05/26/2007   HYPERTENSION 02/07/2007   Syncope and collapse    TOBACCO ABUSE 04/01/2008    Social History   Socioeconomic History   Marital status: Widowed    Spouse name: Not on file   Number of children: 1   Years of education: Not on file   Highest education level: Not on file  Occupational History   Not on file  Tobacco Use   Smoking status: Former    Packs/day: 0.30    Types: Cigarettes   Smokeless tobacco: Never  Vaping Use   Vaping Use: Never used  Substance and Sexual Activity   Alcohol use: No   Drug use: No    Sexual activity: Not on file  Other Topics Concern   Not on file  Social History Narrative   Not on file   Social Determinants of Health   Financial Resource Strain: Low Risk  (11/28/2020)   Overall Financial Resource Strain (CARDIA)    Difficulty of Paying Living Expenses: Not hard at all  Food Insecurity: No Food Insecurity (11/28/2020)   Hunger Vital Sign    Worried About Running Out of Food in the Last Year: Never true    [place] in the Last Year: Never true  Transportation Needs: No Transportation Needs (11/28/2020)   PRAPARE - Hydrologist (Medical): No    Lack of Transportation (Non-Medical): No  Physical Activity: Inactive (11/28/2020)   Exercise Vital Sign    Days of Exercise per Week: 0 days    Minutes of Exercise per Session: 0 min  Stress: No Stress Concern Present (11/28/2020)   [place]    Feeling of Stress : Not at all  Social Connections: Moderately Integrated (11/28/2020)   Social Connection and Isolation Panel [NHANES]    Frequency of Communication with Friends and Family: Twice a week    Frequency of Social Gatherings with Friends and Family: Twice a week    Attends Religious Services: More than 4 times per year  Active Member of Clubs or Organizations: No    Attends Archivist Meetings: Never    Marital Status: Married  Human resources officer Violence: Not At Risk (11/28/2020)   Humiliation, Afraid, Rape, and Kick questionnaire    Fear of Current or Ex-Partner: No    Emotionally Abused: No    Physically Abused: No    Sexually Abused: No    Past Surgical History:  Procedure Laterality Date   APPENDECTOMY     BIOPSY  08/03/2020   Procedure: BIOPSY;  Surgeon: Gatha Mayer, MD;  Location: [facility];  Service: Endoscopy;;   COLONOSCOPY WITH PROPOFOL N/A 08/03/2020   Procedure: COLONOSCOPY WITH PROPOFOL;  Surgeon: Gatha Mayer, MD;  Location: [facility];  Service: Endoscopy;  Laterality: N/A;   ESOPHAGOGASTRODUODENOSCOPY (EGD) WITH PROPOFOL N/A 08/03/2020   Procedure: ESOPHAGOGASTRODUODENOSCOPY (EGD) WITH PROPOFOL;  Surgeon: Gatha Mayer, MD;  Location: [facility];  Service: Endoscopy;  Laterality: N/A;   TONSILLECTOMY      Family History  Problem Relation Age of Onset   Arthritis Neg Hx        family   Hypertension Neg Hx        family   Stroke Neg Hx        family , 1st degree relative   Colon cancer Neg Hx    Colon polyps Neg Hx    Esophageal cancer Neg Hx    Rectal cancer Neg Hx    Stomach cancer Neg Hx     No Known Allergies  Current Outpatient Medications on File Prior to Visit  Medication Sig Dispense Refill   acetaminophen (TYLENOL) 650 MG CR tablet Take 650 mg by mouth daily as needed for pain.     amLODipine (NORVASC) 10 MG tablet Take 1 tablet (10 mg total) by mouth daily. 90 tablet 0   carvedilol (COREG) 3.125 MG tablet Take 1 tablet (3.125 mg total) by mouth 2 (two) times daily with a meal. 180 tablet 0   docusate sodium (COLACE) 100 MG capsule Take 100 mg by mouth 2 (two) times daily as needed for mild constipation.     ferrous sulfate 325 (65 FE) MG EC tablet Take 325 mg by mouth 2 (two) times daily with a meal.     hydrALAZINE (APRESOLINE) 100 MG tablet Take 1 tablet (100 mg total) by mouth every 8 (eight) hours. 270 tablet 1   polyethylene glycol (MIRALAX / GLYCOLAX) 17 g packet Take 17 g by mouth daily.     polyvinyl alcohol (LIQUIFILM TEARS) 1.4 % ophthalmic solution Place 1 drop into both eyes daily as needed for dry eyes.     simvastatin (ZOCOR) 40 MG tablet TAKE 1 TABLET(40 MG) BY MOUTH DAILY 90 tablet 1   tamsulosin (FLOMAX) 0.4 MG CAPS capsule Take 1 capsule (0.4 mg total) by mouth daily. Need physical exam for further refills 90 capsule 0   Current Facility-Administered Medications on File Prior to Visit  Medication Dose Route Frequency Provider Last Rate Last Admin   0.9 %  sodium chloride  infusion  500 mL Intravenous Once Gatha Mayer, MD        BP 130/60   Pulse 65   Temp 97.8 F (36.6 C) (Oral)   Ht '5\' 9"'$  (1.753 m)   Wt 182 lb (82.6 kg)   SpO2 97%   BMI 26.88 kg/m       Objective:   Physical Exam Vitals and nursing note reviewed.  Constitutional:  Appearance: Normal appearance.  Cardiovascular:     Rate and Rhythm: Normal rate and regular rhythm.     Pulses: Normal pulses.     Heart sounds: Normal heart sounds.  Pulmonary:     Effort: Pulmonary effort is normal.     Breath sounds: Normal breath sounds.  Skin:    General: Skin is warm and dry.  Neurological:     General: No focal deficit present.     Mental Status: He is alert and oriented to person, place, and time.  Psychiatric:        Mood and Affect: Mood normal.        Behavior: Behavior normal.        Thought Content: Thought content normal.       Assessment & Plan:   1. Essential hypertension - better controlled since the increase in Hydralzine - Continue with current therapy - Follow up PRN   Dorothyann Peng, NP

## 2021-11-30 ENCOUNTER — Ambulatory Visit (INDEPENDENT_AMBULATORY_CARE_PROVIDER_SITE_OTHER): Payer: Medicare Other

## 2021-11-30 VITALS — Ht 69.0 in | Wt 182.0 lb

## 2021-11-30 DIAGNOSIS — Z0000 Encounter for general adult medical examination without abnormal findings: Secondary | ICD-10-CM | POA: Diagnosis not present

## 2021-11-30 NOTE — Progress Notes (Signed)
Subjective:   Isaac Phelps is a 88 y.o. male who presents for Medicare Annual/Subsequent preventive examination.  Review of Systems    Virtual Visit via Telephone Note  I connected with  Isaac Phelps on 11/30/21 at  3:45 PM EDT by telephone and verified that I am speaking with the correct person using two identifiers.  Location: Patient: Home Provider: Office Persons participating in the virtual visit: patient/Nurse Health Advisor   I discussed the limitations, risks, security and privacy concerns of performing an evaluation and management service by telephone and the availability of in person appointments. The patient expressed understanding and agreed to proceed.  Interactive audio and video telecommunications were attempted between this nurse and patient, however failed, due to patient having technical difficulties OR patient did not have access to video capability.  We continued and completed visit with audio only.  Some vital signs may be absent or patient reported.   Criselda Peaches, LPN  Cardiac Risk Factors include: advanced age (>12mn, >>91women);hypertension;male gender     Objective:    Today's Vitals   11/30/21 1551 11/30/21 1552  Weight: 182 lb (82.6 kg)   Height: '5\' 9"'$  (1.753 m)   PainSc:  0-No pain   Body mass index is 26.88 kg/m.     11/30/2021    3:59 PM 08/03/2021   10:43 AM 02/28/2021   10:43 AM 12/19/2020   11:00 AM 11/28/2020    9:22 AM 09/26/2020    2:14 PM 09/06/2020    2:59 PM  Advanced Directives  Does Patient Have a Medical Advance Directive? Yes No No No Yes No Yes  Type of ATheatre managerof [place] will    Does patient want to make changes to medical advance directive? No - Patient declined No - Patient declined No - Patient declined No - Patient declined  No - Patient declined No - Patient declined  Copy of [place] Chart? No - copy requested    No - copy  requested    Would patient like information on creating a medical advance directive?  No - Patient declined     No - Patient declined    Phelps Medications (verified) Outpatient Encounter Medications as of 11/30/2021  Medication Sig   acetaminophen (TYLENOL) 650 MG CR tablet Take 650 mg by mouth daily as needed for pain.   amLODipine (NORVASC) 10 MG tablet Take 1 tablet (10 mg total) by mouth daily.   carvedilol (COREG) 3.125 MG tablet Take 1 tablet (3.125 mg total) by mouth 2 (two) times daily with a meal.   docusate sodium (COLACE) 100 MG capsule Take 100 mg by mouth 2 (two) times daily as needed for mild constipation.   ferrous sulfate 325 (65 FE) MG EC tablet Take 325 mg by mouth 2 (two) times daily with a meal.   hydrALAZINE (APRESOLINE) 100 MG tablet Take 1 tablet (100 mg total) by mouth every 8 (eight) hours.   polyethylene glycol (MIRALAX / GLYCOLAX) 17 g packet Take 17 g by mouth daily.   polyvinyl alcohol (LIQUIFILM TEARS) 1.4 % ophthalmic solution Place 1 drop into both eyes daily as needed for dry eyes.   simvastatin (ZOCOR) 40 MG tablet TAKE 1 TABLET(40 MG) BY MOUTH DAILY   tamsulosin (FLOMAX) 0.4 MG CAPS capsule Take 1 capsule (0.4 mg total) by mouth daily. Need physical exam for further refills   Facility-Administered Encounter Medications as of 11/30/2021  Medication   0.9 %  sodium chloride infusion    Allergies (verified) Patient has no known allergies.   History: Past Medical History:  Diagnosis Date   Adenocarcinoma of rectum ([place])    Anxiety    BENIGN PROSTATIC HYPERTROPHY 04/01/2008   Coronary artery disease    GERD (gastroesophageal reflux disease)    HYPERLIPIDEMIA 05/26/2007   HYPERTENSION 02/07/2007   Syncope and collapse    TOBACCO ABUSE 04/01/2008   Past Surgical History:  Procedure Laterality Date   APPENDECTOMY     BIOPSY  08/03/2020   Procedure: BIOPSY;  Surgeon: Gatha Mayer, MD;  Location: [place];  Service: Endoscopy;;   COLONOSCOPY  WITH PROPOFOL N/A 08/03/2020   Procedure: COLONOSCOPY WITH PROPOFOL;  Surgeon: Gatha Mayer, MD;  [facility] ENDOSCOPY;  Service: Endoscopy;  Laterality: N/A;   ESOPHAGOGASTRODUODENOSCOPY (EGD) WITH PROPOFOL N/A 08/03/2020   Procedure: ESOPHAGOGASTRODUODENOSCOPY (EGD) WITH PROPOFOL;  Surgeon: Gatha Mayer, MD;  Location: [place];  Service: Endoscopy;  Laterality: N/A;   TONSILLECTOMY     Family History  Problem Relation Age of Onset   Arthritis Neg Hx        family   Hypertension Neg Hx        family   Stroke Neg Hx        family , 1st degree relative   Colon cancer Neg Hx    Colon polyps Neg Hx    Esophageal cancer Neg Hx    Rectal cancer Neg Hx    Stomach cancer Neg Hx    Social History   Socioeconomic History   Marital status: Widowed    Spouse name: Not on file   Number of children: 1   Years of education: Not on file   Highest education level: Not on file  Occupational History   Not on file  Tobacco Use   Smoking status: Former    Packs/day: 0.30    Types: Cigarettes   Smokeless tobacco: Never  Vaping Use   Vaping Use: Never used  Substance and Sexual Activity   Alcohol use: No   Drug use: No   Sexual activity: Not on file  Other Topics Concern   Not on file  Social History Narrative   Not on file   Social Determinants of Health   Financial Resource Strain: Low Risk  (11/30/2021)   Overall Financial Resource Strain (CARDIA)    Difficulty of Paying Living Expenses: Not hard at all  Food Insecurity: No Food Insecurity (11/30/2021)   Hunger Vital Sign    Worried About Running Out of Food in the Last Year: Never true    [place] in the Last Year: Never true  Transportation Needs: No Transportation Needs (11/30/2021)   PRAPARE - Hydrologist (Medical): No    Lack of Transportation (Non-Medical): No  Physical Activity: Inactive (11/30/2021)   Exercise Vital Sign    Days of Exercise per Week: 0 days    Minutes of  Exercise per Session: 0 min  Stress: No Stress Concern Present (11/30/2021)   [place]    Feeling of Stress : Not at all  Social Connections: Socially Isolated (11/30/2021)   Social Connection and Isolation Panel [NHANES]    Frequency of Communication with Friends and Family: More than three times a week    Frequency of Social Gatherings with Friends and Family: More than three times a week  Attends Religious Services: Never    Active Member of Clubs or Organizations: No    Attends Archivist Meetings: Never    Marital Status: Widowed      Clinical Intake:  Pre-visit preparation completed: No  Diabetic?  No  Interpreter Needed?: No   Activities of Daily Living    11/30/2021    3:57 PM  In your present state of health, do you have any difficulty performing the following activities:  Hearing? 0  Vision? 0  Difficulty concentrating or making decisions? 0  Walking or climbing stairs? 0  Dressing or bathing? 0  Doing errands, shopping? 0  Preparing Food and eating ? N  Using the Toilet? N  In the past six months, have you accidently leaked urine? N  Do you have problems with loss of bowel control? N  Managing your Medications? N  Managing your Finances? N  Housekeeping or managing your Housekeeping? N    Patient Care Team: Dorothyann Peng, NP as PCP - General (Family Medicine) Ladell Pier, MD as Consulting Physician (Oncology)  Indicate any recent Medical Services you may have received from other than [facility] providers in the past year (date may be approximate).     Assessment:   This is a routine wellness examination for [place].  Hearing/Vision screen Hearing Screening - Comments:: No hearing difficulty Vision Screening - Comments:: Wear glasses. Followed by Dr Schuyler Amor  Dietary issues and exercise activities discussed: Exercise limited by: None identified   Goals Addressed                This Visit's Progress     no Phelps goals (pt-stated)         Depression Screen    11/30/2021    3:55 PM 11/28/2020    9:23 AM 11/28/2020    9:20 AM 04/15/2018   10:40 AM 08/12/2015    8:57 AM 05/25/2014    8:52 AM 05/25/2013   10:32 AM  PHQ 2/9 Scores  PHQ - 2 Score 0 0 0 0 0 0 0    Fall Risk    11/30/2021    3:58 PM 11/28/2020    9:22 AM 04/23/2018    2:44 PM 04/15/2018   10:40 AM 08/12/2015    8:57 AM  Fall Risk   Falls in the past year? 0 0 0 0 No  Comment   E[place]i Telephone Survey: data to providers prior to load    Number falls in past yr: 0 0  0   Injury with Fall? 0 0     Risk for fall due to : No Fall Risks      Follow up  Falls evaluation completed       [place]:  Any stairs in or around the home? No  If so, are there any without handrails? No  Home free of loose throw rugs in walkways, pet beds, electrical cords, etc? Yes  Adequate lighting in your home to reduce risk of falls? Yes   ASSISTIVE DEVICES UTILIZED TO PREVENT FALLS:  Life alert? No  Use of a cane, walker or w/c? No  Grab bars in the bathroom? No  Shower chair or bench in shower? Yes  Elevated toilet seat or a handicapped toilet? No   TIMED UP AND GO:  Was the test performed? No . Audio Visit  Cognitive Function:      11/30/2021    3:59 PM  6CIT Screen  What Year? 0  points  What month? 0 points  What time? 0 points  Count back from 20 0 points  Months in reverse 0 points  Repeat phrase 0 points  Total Score 0 points    Immunizations Immunization History  Administered Date(s) Administered   Influenza Split 04/05/2011, 03/12/2012   Influenza Whole 03/04/2006, 01/23/2010   Influenza, High Dose Seasonal PF 03/03/2013, 02/15/2015   Influenza-Unspecified 04/18/2017   PFIZER(Purple Top)SARS-COV-2 Vaccination 09/28/2019, 10/19/2019, 05/04/2020   Pneumococcal Conjugate-13 03/03/2015   Pneumococcal Polysaccharide-23 04/18/2017   Td  03/04/2006   Tdap 03/03/2015    TDAP status: Up to date  Flu Vaccine status: Declined, Education has been provided regarding the importance of this vaccine but patient still declined. Advised may receive this vaccine at local pharmacy or Health Dept. Aware to provide a copy of the vaccination record if obtained from local pharmacy or Health Dept. Verbalized acceptance and understanding.  Pneumococcal vaccine status: Up to date  Covid-19 vaccine status: Completed vaccines  Qualifies for Shingles Vaccine? Yes   Zostavax completed No   Shingrix Completed?: No.    Education has been provided regarding the importance of this vaccine. Patient has been advised to call insurance company to determine out of pocket expense if they have not yet received this vaccine. Advised may also receive vaccine at local pharmacy or Health Dept. Verbalized acceptance and understanding.  Screening Tests Health Maintenance  Topic Date Due   COVID-19 Vaccine (4 - Booster for Pfizer series) 12/16/2021 (Originally 06/29/2020)   Zoster Vaccines- Shingrix (1 of 2) 03/02/2022 (Originally 01/19/1950)   INFLUENZA VACCINE  01/02/2022   TETANUS/TDAP  03/02/2025   Pneumonia Vaccine 81+ Years old  Completed   HPV VACCINES  Aged Out    Health Maintenance  There are no preventive care reminders to display for this patient.   Colorectal cancer screening: No longer required.   Lung Cancer Screening: (Low Dose CT Chest recommended if Age 2-80 years, 30 pack-year currently smoking OR have quit w/in 15years.) does not qualify.     Additional Screening:  Hepatitis C Screening: does not qualify; Completed   Vision Screening: Recommended annual ophthalmology exams for early detection of glaucoma and other disorders of the eye. Is the patient up to date with their annual eye exam?  Yes  Who is the provider or what is the name of the office in which the patient attends annual eye exams? Dr Schuyler Amor If pt is not established  with a provider, would they like to be referred to a provider to establish care? No .   Dental Screening: Recommended annual dental exams for proper oral hygiene  Community Resource Referral / Chronic Care Management:  CRR required this visit?  No   CCM required this visit?  No      Plan:     I have personally reviewed and noted the following in the patient's chart:   Medical and social history Use of alcohol, tobacco or illicit drugs  Phelps medications and supplements including opioid prescriptions. Patient is not currently taking opioid prescriptions. Functional ability and status Nutritional status Physical activity Advanced directives List of other physicians Hospitalizations, surgeries, and ER visits in previous 12 months Vitals Screenings to include cognitive, depression, and falls Referrals and appointments  In addition, I have reviewed and discussed with patient certain preventive protocols, quality metrics, and best practice recommendations. A written personalized care plan for preventive services as well as general preventive health recommendations were provided to patient.     Nino Parsley  Redmond Pulling, [place]   6/83/[id number]   Nurse Notes: None

## 2021-11-30 NOTE — Patient Instructions (Signed)
Isaac Phelps , Thank you for taking time to come for your Medicare Wellness Visit. I appreciate your ongoing commitment to your health goals. Please review the following plan we discussed and let me know if I can assist you in the future.   These are the goals we discussed:  Goals   None     This is a list of the screening recommended for you and due dates:  Health Maintenance  Topic Date Due   Zoster (Shingles) Vaccine (1 of 2) Never done   COVID-19 Vaccine (4 - Booster for Pfizer series) 06/29/2020   Flu Shot  01/02/2022   Tetanus Vaccine  03/02/2025   Pneumonia Vaccine  Completed   HPV Vaccine  Aged Out    Advanced directives: Yes  Conditions/risks identified: None  Next appointment: Follow up in one year for your annual wellness visit.   Preventive Care 35 Years and Older, Male Preventive care refers to lifestyle choices and visits with your health care provider that can promote health and wellness. What does preventive care include? A yearly physical exam. This is also called an annual well check. Dental exams once or twice a year. Routine eye exams. Ask your health care provider how often you should have your eyes checked. Personal lifestyle choices, including: Daily care of your teeth and gums. Regular physical activity. Eating a healthy diet. Avoiding tobacco and drug use. Limiting alcohol use. Practicing safe sex. Taking low doses of aspirin every day. Taking vitamin and mineral supplements as recommended by your health care provider. What happens during an annual well check? The services and screenings done by your health care provider during your annual well check will depend on your age, overall health, lifestyle risk factors, and family history of disease. Counseling  Your health care provider may ask you questions about your: Alcohol use. Tobacco use. Drug use. Emotional well-being. Home and relationship well-being. Sexual activity. Eating  habits. History of falls. Memory and ability to understand (cognition). Work and work Statistician. Screening  You may have the following tests or measurements: Height, weight, and BMI. Blood pressure. Lipid and cholesterol levels. These may be checked every 5 years, or more frequently if you are over 85 years old. Skin check. Lung cancer screening. You may have this screening every year starting at age 87 if you have a 30-pack-year history of smoking and currently smoke or have quit within the past 15 years. Fecal occult blood test (FOBT) of the stool. You may have this test every year starting at age 62. Flexible sigmoidoscopy or colonoscopy. You may have a sigmoidoscopy every 5 years or a colonoscopy every 10 years starting at age 68. Prostate cancer screening. Recommendations will vary depending on your family history and other risks. Hepatitis C blood test. Hepatitis B blood test. Sexually transmitted disease (STD) testing. Diabetes screening. This is done by checking your blood sugar (glucose) after you have not eaten for a while (fasting). You may have this done every 1-3 years. Abdominal aortic aneurysm (AAA) screening. You may need this if you are a current or former smoker. Osteoporosis. You may be screened starting at age 22 if you are at high risk. Talk with your health care provider about your test results, treatment options, and if necessary, the need for more tests. Vaccines  Your health care provider may recommend certain vaccines, such as: Influenza vaccine. This is recommended every year. Tetanus, diphtheria, and acellular pertussis (Tdap, Td) vaccine. You may need a Td booster every 10 years.  Zoster vaccine. You may need this after age 19. Pneumococcal 13-valent conjugate (PCV13) vaccine. One dose is recommended after age 62. Pneumococcal polysaccharide (PPSV23) vaccine. One dose is recommended after age 5. Talk to your health care provider about which screenings and  vaccines you need and how often you need them. This information is not intended to replace advice given to you by your health care provider. Make sure you discuss any questions you have with your health care provider. Document Released: 06/17/2015 Document Revised: 02/08/2016 Document Reviewed: 03/22/2015 Elsevier Interactive Patient Education  2017 [employer] Prevention in the Home Falls can cause injuries. They can happen to people of all ages. There are many things you can do to make your home safe and to help prevent falls. What can I do on the outside of my home? Regularly fix the edges of walkways and driveways and fix any cracks. Remove anything that might make you trip as you walk through a door, such as a raised step or threshold. Trim any bushes or trees on the path to your home. Use bright outdoor lighting. Clear any walking paths of anything that might make someone trip, such as rocks or tools. Regularly check to see if handrails are loose or broken. Make sure that both sides of any steps have handrails. Any raised decks and porches should have guardrails on the edges. Have any leaves, snow, or ice cleared regularly. Use sand or salt on walking paths during winter. Clean up any spills in your garage right away. This includes oil or grease spills. What can I do in the bathroom? Use night lights. Install grab bars by the toilet and in the tub and shower. Do not use towel bars as grab bars. Use non-skid mats or decals in the tub or shower. If you need to sit down in the shower, use a plastic, non-slip stool. Keep the floor dry. Clean up any water that spills on the floor as soon as it happens. Remove soap buildup in the tub or shower regularly. Attach bath mats securely with double-sided non-slip rug tape. Do not have throw rugs and other things on the floor that can make you trip. What can I do in the bedroom? Use night lights. Make sure that you have a light by your  bed that is easy to reach. Do not use any sheets or blankets that are too big for your bed. They should not hang down onto the floor. Have a firm chair that has side arms. You can use this for support while you get dressed. Do not have throw rugs and other things on the floor that can make you trip. What can I do in the kitchen? Clean up any spills right away. Avoid walking on wet floors. Keep items that you use a lot in easy-to-reach places. If you need to reach something above you, use a strong step stool that has a grab bar. Keep electrical cords out of the way. Do not use floor polish or wax that makes floors slippery. If you must use wax, use non-skid floor wax. Do not have throw rugs and other things on the floor that can make you trip. What can I do with my stairs? Do not leave any items on the stairs. Make sure that there are handrails on both sides of the stairs and use them. Fix handrails that are broken or loose. Make sure that handrails are as long as the stairways. Check any carpeting to make sure that  it is firmly attached to the stairs. Fix any carpet that is loose or worn. Avoid having throw rugs at the top or bottom of the stairs. If you do have throw rugs, attach them to the floor with carpet tape. Make sure that you have a light switch at the top of the stairs and the bottom of the stairs. If you do not have them, ask someone to add them for you. What else can I do to help prevent falls? Wear shoes that: Do not have high heels. Have rubber bottoms. Are comfortable and fit you well. Are closed at the toe. Do not wear sandals. If you use a stepladder: Make sure that it is fully opened. Do not climb a closed stepladder. Make sure that both sides of the stepladder are locked into place. Ask someone to hold it for you, if possible. Clearly mark and make sure that you can see: Any grab bars or handrails. First and last steps. Where the edge of each step is. Use tools that  help you move around (mobility aids) if they are needed. These include: Canes. Walkers. Scooters. Crutches. Turn on the lights when you go into a dark area. Replace any light bulbs as soon as they burn out. Set up your furniture so you have a clear path. Avoid moving your furniture around. If any of your floors are uneven, fix them. If there are any pets around you, be aware of where they are. Review your medicines with your doctor. Some medicines can make you feel dizzy. This can increase your chance of falling. Ask your doctor what other things that you can do to help prevent falls. This information is not intended to replace advice given to you by your health care provider. Make sure you discuss any questions you have with your health care provider. Document Released: 03/17/2009 Document Revised: 10/27/2015 Document Reviewed: 06/25/2014 Elsevier Interactive Patient Education  2017 [employer].

## 2021-12-14 ENCOUNTER — Encounter (HOSPITAL_COMMUNITY): Payer: Self-pay | Admitting: Emergency Medicine

## 2021-12-14 ENCOUNTER — Ambulatory Visit (HOSPITAL_COMMUNITY)
Admission: EM | Admit: 2021-12-14 | Discharge: 2021-12-14 | Disposition: A | Discharge status: Home / Self Care | Payer: Medicare Other | Attending: Urgent Care | Admitting: Urgent Care

## 2021-12-14 DIAGNOSIS — S00212A Abrasion of left eyelid and periocular area, initial encounter: Secondary | ICD-10-CM | POA: Diagnosis not present

## 2021-12-14 MED ORDER — ERYTHROMYCIN 5 MG/GM OP OINT
TOPICAL_OINTMENT | OPHTHALMIC | 0 refills | Status: DC
Start: 2021-12-14 — End: 2022-05-14

## 2021-12-14 NOTE — ED Provider Notes (Signed)
[place]    CSN: [id number] Arrival date & time: 12/14/21  1807      History   Chief Complaint Chief Complaint  Patient presents with   Eye Problem    HPI Isaac Phelps is a 88 y.o. male.   Pleasant 88 year old male presents today with concern of blood on his pillow when he woke up this morning from his left eye.  He reports there was blood to his medial left eye, and under it as well.  He denies any ocular pain.  He denies any blurred vision, change in vision, pain with extraocular movements, headache, photophobia, or eye discharge.  He notes no eyelid swelling, flaking, or itchiness.  He has not tried any treatments to his eye. Patient does have a history of hypertension, but states he did not take his medications today, and frequently forgets.   Eye Problem   Past Medical History:  Diagnosis Date   Adenocarcinoma of rectum ([place])    Anxiety    BENIGN PROSTATIC HYPERTROPHY 04/01/2008   Coronary artery disease    GERD (gastroesophageal reflux disease)    HYPERLIPIDEMIA 05/26/2007   HYPERTENSION 02/07/2007   Syncope and collapse    TOBACCO ABUSE 04/01/2008    Patient Active Problem List   Diagnosis Date Noted   Adenocarcinoma of rectum ([place])    Cerebral embolism with cerebral infarction 08/01/2020   Iron deficiency anemia    HTN (hypertension), malignant 07/29/2020   Hypertensive urgency    AKI (acute kidney injury) ([place])    Anemia of chronic disease 05/07/2017   Gouty arthritis 03/21/2016   CKD (chronic kidney disease) stage 3, GFR 30-59 ml/min ([place]) 02/09/2015   TOBACCO ABUSE 04/01/2008   BPH (benign prostatic hyperplasia) 04/01/2008   Dyslipidemia 05/26/2007   Essential hypertension 02/07/2007    Past Surgical History:  Procedure Laterality Date   APPENDECTOMY     BIOPSY  08/03/2020   Procedure: BIOPSY;  Surgeon: Gatha Mayer, MD;  Location: [facility];  Service: Endoscopy;;   COLONOSCOPY WITH PROPOFOL N/A 08/03/2020   Procedure:  COLONOSCOPY WITH PROPOFOL;  Surgeon: Gatha Mayer, MD;  Location: [facility] ENDOSCOPY;  Service: Endoscopy;  Laterality: N/A;   ESOPHAGOGASTRODUODENOSCOPY (EGD) WITH PROPOFOL N/A 08/03/2020   Procedure: ESOPHAGOGASTRODUODENOSCOPY (EGD) WITH PROPOFOL;  Surgeon: Gatha Mayer, MD;  Location: [facility];  Service: Endoscopy;  Laterality: N/A;   TONSILLECTOMY         Home Medications    Prior to Admission medications   Medication Sig Start Date End Date Taking? Authorizing Provider  erythromycin ophthalmic ointment Place a 1/2 inch ribbon of ointment to the affected area of L eye three times daily for a maximum of 5 days 12/14/21  Yes Hadia Minier L, PA  acetaminophen (TYLENOL) 650 MG CR tablet Take 650 mg by mouth daily as needed for pain.    [provider]  amLODipine (NORVASC) 10 MG tablet Take 1 tablet (10 mg total) by mouth daily. 09/27/21 11/26/21  Nafziger, Tommi Rumps, NP  carvedilol (COREG) 3.125 MG tablet Take 1 tablet (3.125 mg total) by mouth 2 (two) times daily with a meal. 09/27/21 12/26/21  Nafziger, Tommi Rumps, NP  docusate sodium (COLACE) 100 MG capsule Take 100 mg by mouth 2 (two) times daily as needed for mild constipation. 11/09/21   Ladell Pier, MD  ferrous sulfate 325 (65 FE) MG EC tablet Take 325 mg by mouth 2 (two) times daily with a meal. 11/09/21   Ladell Pier, MD  hydrALAZINE (  APRESOLINE) 100 MG tablet Take 1 tablet (100 mg total) by mouth every 8 (eight) hours. 11/01/21 01/30/22  Nafziger, Tommi Rumps, NP  polyethylene glycol (MIRALAX / GLYCOLAX) 17 g packet Take 17 g by mouth daily. 11/09/21   Ladell Pier, MD  polyvinyl alcohol (LIQUIFILM TEARS) 1.4 % ophthalmic solution Place 1 drop into both eyes daily as needed for dry eyes.    [provider]  simvastatin (ZOCOR) 40 MG tablet TAKE 1 TABLET(40 MG) BY MOUTH DAILY 04/19/21   Nafziger, Tommi Rumps, NP  tamsulosin (FLOMAX) 0.4 MG CAPS capsule Take 1 capsule (0.4 mg total) by mouth daily. Need physical exam for further refills  06/20/21   Nafziger, Tommi Rumps, NP    Family History Family History  Problem Relation Age of Onset   Arthritis Neg Hx        family   Hypertension Neg Hx        family   Stroke Neg Hx        family , 1st degree relative   Colon cancer Neg Hx    Colon polyps Neg Hx    Esophageal cancer Neg Hx    Rectal cancer Neg Hx    Stomach cancer Neg Hx     Social History Social History   Tobacco Use   Smoking status: Former    Packs/day: 0.30    Types: Cigarettes   Smokeless tobacco: Never  Vaping Use   Vaping Use: Never used  Substance Use Topics   Alcohol use: No   Drug use: No     Allergies   Patient has no known allergies.   Review of Systems Review of Systems As per HPI  Physical Exam Triage Vital Signs ED Triage Vitals  Enc Vitals Group     BP 12/14/21 1928 (!) 181/82     Pulse Rate 12/14/21 1928 74     Resp 12/14/21 1928 18     Temp 12/14/21 1928 98.3 F (36.8 C)     Temp Source 12/14/21 1928 Oral     SpO2 12/14/21 1928 98 %     Weight --      Height --      Head Circumference --      Peak Flow --      Pain Score 12/14/21 1927 0     Pain Loc --      Pain Edu? --      Excl. in [place]? --    No data found.  Updated Vital Signs BP (!) 181/82 (BP Location: Left Arm) Comment: reports not taking all BP meds today.  Pulse 74   Temp 98.3 F (36.8 C) (Oral)   Resp 18   SpO2 98%   Visual Acuity Right Eye Distance:   Left Eye Distance:   Bilateral Distance:    Right Eye Near:   Left Eye Near:    Bilateral Near:     Physical Exam Vitals and nursing note reviewed. Exam conducted with a chaperone present.  Constitutional:      General: He is not in acute distress.    Appearance: Normal appearance. He is normal weight. He is not ill-appearing, toxic-appearing or diaphoretic.  HENT:     Head: Normocephalic and atraumatic.     Right Ear: External ear normal.     Left Ear: External ear normal.     Nose: No congestion or rhinorrhea.     Mouth/Throat:      Mouth: Mucous membranes are moist.     Pharynx: No  posterior oropharyngeal erythema.  Eyes:     General: Lids are everted, no foreign bodies appreciated. Vision grossly intact. Gaze aligned appropriately. No allergic shiner, visual field deficit or scleral icterus.       Right eye: No foreign body, discharge or hordeolum.        Left eye: No foreign body, discharge or hordeolum.     Extraocular Movements: Extraocular movements intact.     Right eye: Normal extraocular motion and no nystagmus.     Left eye: Normal extraocular motion and no nystagmus.     Conjunctiva/sclera: Conjunctivae normal.     Right eye: Right conjunctiva is not injected. No chemosis, exudate or hemorrhage.    Left eye: Left conjunctiva is not injected. No chemosis, exudate or hemorrhage.    Pupils: Pupils are equal, round, and reactive to light.      Comments: Red lines annotated visible abrasions Pink lines indicate location of dried blood  Pulmonary:     Effort: Pulmonary effort is normal. No respiratory distress.  Skin:    Findings: No rash.  Neurological:     Mental Status: He is alert.      UC Treatments / Results  Labs (all labs ordered are listed, but only abnormal results are displayed) Labs Reviewed - No data to display  EKG   Radiology No results found.  Procedures Procedures (including critical care time)  Medications Ordered in UC Medications - No data to display  Initial Impression / Assessment and Plan / UC Course  I have reviewed the triage vital signs and the nursing notes.  Pertinent labs & imaging results that were available during my care of the patient were reviewed by me and considered in my medical decision making (see chart for details).     Abrasion of L eyelid -discussed with patient and daughter that it appears he must of cut himself likely with a fingernail while sleeping.  Patient's eyeball, conjunctive a, and eyelid apart from the 3 small abrasions, are completely  normal.  Patient had not tried to remove the dried blood.  Recommended he use a warm moist compress with [place] baby shampoo to cleanse the eye.  We will give him erythromycin eye ointment to place over the abrasion to prevent infection.   Final Clinical Impressions(s) / UC Diagnoses   Final diagnoses:  Abrasion of left eyelid, initial encounter     Discharge Instructions      It appears you have a small cut to the inside of your upper left eyelid. This is likely the cause of the bleeding. Please use a clean warm wet washcloth with [employer] baby shampoo and gently massage the eyelid to cleanse the blood and bacteria. Apply erythromycin eye ointment to the affected area 3 times daily until resolved, for maximum of 5 days.   ED Prescriptions     Medication Sig Dispense Auth. Provider   erythromycin ophthalmic ointment Place a 1/2 inch ribbon of ointment to the affected area of L eye three times daily for a maximum of 5 days 3.5 g Robel Wuertz L, PA      PDMP not reviewed this encounter.   Chaney Malling, [place] 12/14/21 2138

## 2021-12-14 NOTE — ED Triage Notes (Signed)
Pt reports noticing something on his eyelid yesterday and this morning noticed blood was on his pillow and noticed his left eye was bleeding. Bleeding has since then stopped.

## 2021-12-14 NOTE — Discharge Instructions (Signed)
It appears you have a small cut to the inside of your upper left eyelid. This is likely the cause of the bleeding. Please use a clean warm wet washcloth with Wyn[employer] baby shampoo and gently massage the eyelid to cleanse the blood and bacteria. Apply erythromycin eye ointment to the affected area 3 times daily until resolved, for maximum of 5 days.

## 2022-01-05 ENCOUNTER — Other Ambulatory Visit: Payer: Self-pay | Admitting: *Deleted

## 2022-01-05 NOTE — Patient Outreach (Signed)
[facility]) Care Management  01/05/2022  Isaac Phelps 02/05/1931 [id number]    Care Management   Outreach Note  01/05/2022 Name: Isaac Phelps MRN: [id number] DOB: November 30, 1930  Unsuccessful Outreach #1  Follow Up Plan:  A HIPAA compliant phone message was left for the patient providing contact information and requesting a return call.  The care management team will reach out to the patient again over the next 7 days.   Raina Mina, RN Care Management Coordinator [place] Office [phone]

## 2022-02-12 ENCOUNTER — Encounter: Payer: Self-pay | Admitting: Nurse Practitioner

## 2022-02-12 ENCOUNTER — Inpatient Hospital Stay: Payer: Medicare Other | Attending: Radiation Oncology

## 2022-02-12 ENCOUNTER — Inpatient Hospital Stay (HOSPITAL_BASED_OUTPATIENT_CLINIC_OR_DEPARTMENT_OTHER): Payer: Medicare Other | Admitting: Nurse Practitioner

## 2022-02-12 VITALS — BP 151/64 | HR 64 | Temp 98.1°F | Resp 20 | Ht 69.0 in | Wt 178.2 lb

## 2022-02-12 DIAGNOSIS — E538 Deficiency of other specified B group vitamins: Secondary | ICD-10-CM | POA: Diagnosis present

## 2022-02-12 DIAGNOSIS — I10 Essential (primary) hypertension: Secondary | ICD-10-CM | POA: Diagnosis not present

## 2022-02-12 DIAGNOSIS — I2510 Atherosclerotic heart disease of native coronary artery without angina pectoris: Secondary | ICD-10-CM | POA: Insufficient documentation

## 2022-02-12 DIAGNOSIS — C20 Malignant neoplasm of rectum: Secondary | ICD-10-CM

## 2022-02-12 DIAGNOSIS — D509 Iron deficiency anemia, unspecified: Secondary | ICD-10-CM | POA: Insufficient documentation

## 2022-02-12 DIAGNOSIS — M109 Gout, unspecified: Secondary | ICD-10-CM | POA: Insufficient documentation

## 2022-02-12 DIAGNOSIS — N400 Enlarged prostate without lower urinary tract symptoms: Secondary | ICD-10-CM | POA: Insufficient documentation

## 2022-02-12 DIAGNOSIS — Z79899 Other long term (current) drug therapy: Secondary | ICD-10-CM | POA: Insufficient documentation

## 2022-02-12 DIAGNOSIS — Z85048 Personal history of other malignant neoplasm of rectum, rectosigmoid junction, and anus: Secondary | ICD-10-CM | POA: Insufficient documentation

## 2022-02-12 LAB — CBC WITH DIFFERENTIAL (CANCER CENTER ONLY)
Abs Immature Granulocytes: 0.03 10*3/uL (ref 0.00–0.07)
Basophils Absolute: 0 10*3/uL (ref 0.0–0.1)
Basophils Relative: 1 %
Eosinophils Absolute: 0.1 10*3/uL (ref 0.0–0.5)
Eosinophils Relative: 2 %
HCT: 28.5 % — ABNORMAL LOW (ref 39.0–52.0)
Hemoglobin: 8.9 g/dL — ABNORMAL LOW (ref 13.0–17.0)
Immature Granulocytes: 1 %
Lymphocytes Relative: 9 %
Lymphs Abs: 0.4 10*3/uL — ABNORMAL LOW (ref 0.7–4.0)
MCH: 23.7 pg — ABNORMAL LOW (ref 26.0–34.0)
MCHC: 31.2 g/dL (ref 30.0–36.0)
MCV: 76 fL — ABNORMAL LOW (ref 80.0–100.0)
Monocytes Absolute: 0.4 10*3/uL (ref 0.1–1.0)
Monocytes Relative: 7 %
Neutro Abs: 3.8 10*3/uL (ref 1.7–7.7)
Neutrophils Relative %: 80 %
Platelet Count: 236 10*3/uL (ref 150–400)
RBC: 3.75 MIL/uL — ABNORMAL LOW (ref 4.22–5.81)
RDW: 17.8 % — ABNORMAL HIGH (ref 11.5–15.5)
WBC Count: 4.7 10*3/uL (ref 4.0–10.5)
nRBC: 0 % (ref 0.0–0.2)

## 2022-02-12 LAB — FERRITIN: Ferritin: 9 ng/mL — ABNORMAL LOW (ref 24–336)

## 2022-02-12 NOTE — Progress Notes (Signed)
  [place] OFFICE PROGRESS NOTE   Diagnosis: Rectal cancer  INTERVAL HISTORY:   Mr. Doescher returns as scheduled.  He overall feels well.  No bleeding or pain with bowel movements.  No nausea or vomiting.  He occasionally takes an iron pill.  He does not take oral iron consistently due to constipation.  Objective:  Vital signs in last 24 hours:  Blood pressure (!) 151/64, pulse 64, temperature 98.1 F (36.7 C), temperature source Oral, resp. rate 20, height '5\' 9"'$  (1.753 m), weight 178 lb 3.2 oz (80.8 kg), SpO2 98 %.    Lymphatics: No palpable cervical, supraclavicular, axillary or inguinal lymph nodes. Resp: Distant breath sounds.  No respiratory distress. Cardio: Regular rate and rhythm. GI: Abdomen soft and nontender.  No hepatosplenomegaly. Vascular: No leg edema.  Lab Results:  Lab Results  Component Value Date   WBC 4.7 02/12/2022   HGB 8.9 (L) 02/12/2022   HCT 28.5 (L) 02/12/2022   MCV 76.0 (L) 02/12/2022   PLT 236 02/12/2022   NEUTROABS 3.8 02/12/2022    Imaging:  No results found.  Medications: I have reviewed the patient's current medications.  Assessment/Plan: Rectal cancer Colonoscopy 08/03/2020-distal rectal mass abutting the anus, biopsy confirmed adenocarcinoma CT angiography 07/29/2020-hazy soft tissue at the porta hepatis encasing the right hepatic artery, exophytic soft tissue lesion at the upper pole of the left kidney-indeterminate, partially solid nodule in the left lower lobe-present on a CT in 2013 suspicious, suspicious for a low-grade pulmonary neoplasm, enlarged prostate, MRI abdomen 07/30/2020-no evidence of mass in the porta hepatis, hypoenhancing lesions in the upper pole of the left kidney and interpolar region of the right kidney suspicious for small neoplasms, hemosiderosis of the liver and spleen MRI pelvis 08/17/2020-T2, N0; minimal if any distance from tumor to internal anal sphincter Radiation and capecitabine  09/12/2020-10/21/2020 Sigmoidoscopy 12/08/2020-normal rectum and sigmoid, no tumor seen, slight mucosal edema and loss of vascular pattern in the distal rectum, biopsy-benign colonic mucosa, no malignancy Enlarged prostate with an elevated PSA CVAs confirmed on brain MRI 07/31/2020-maintained on aspirin Hypertension Gout History of coronary artery disease Iron deficiency anemia-Feraheme 07/30/2020 Ferrous sulfate 11/09/2021 B12 deficiency-07/31/2020 B12 161, vitamin B12 therapy resumed 12/19/2020  Disposition: Mr. Wenke remains in clinical remission from rectal cancer.  We again discussed sigmoidoscopy surveillance.  He does not wish to proceed with this unless he develops rectal bleeding or pain.  He has a persistent microcytic anemia likely due to iron deficiency.  He takes oral iron sporadically due to constipation.  He will try taking Colace with the oral iron.  He will return for lab and follow-up in 3 months.  We are available to see him sooner if needed.    Ned Card ANP/GNP-BC   02/12/2022  11:43 AM

## 2022-02-13 ENCOUNTER — Telehealth: Payer: Self-pay | Admitting: *Deleted

## 2022-02-13 NOTE — Telephone Encounter (Signed)
Ferritin down to 9. Dr. Benay Spice requests patient be called to inquire if he is taking ferrous sulfate bid and if he would be willing to have iron infusion? Left VM for patient to call back to discuss labs and also sent MyChart message.

## 2022-02-14 NOTE — Telephone Encounter (Signed)
Left 2nd voice mail for patient to call back and left VM w/his daughter requesting she ask him to call office.

## 2022-02-15 NOTE — Telephone Encounter (Signed)
3rd attempt to reach [name] without success. He has not read his MyChart message as of yet.

## 2022-02-22 ENCOUNTER — Telehealth: Payer: Self-pay | Admitting: Adult Health

## 2022-02-22 ENCOUNTER — Other Ambulatory Visit: Payer: Self-pay

## 2022-02-22 DIAGNOSIS — N400 Enlarged prostate without lower urinary tract symptoms: Secondary | ICD-10-CM

## 2022-02-22 MED ORDER — TAMSULOSIN HCL 0.4 MG PO CAPS: 0.4000 mg | ORAL_CAPSULE | Freq: Every day | ORAL | 0 refills | Status: DC

## 2022-02-22 NOTE — Telephone Encounter (Signed)
Rx refilled.

## 2022-02-22 NOTE — Telephone Encounter (Signed)
Refill of tamsulosin (FLOMAX) 0.4 MG CAPS capsule

## 2022-03-07 ENCOUNTER — Telehealth: Payer: Self-pay | Admitting: *Deleted

## 2022-03-07 NOTE — Patient Outreach (Signed)
  Care Coordination   03/07/2022 Name: Isaac Phelps MRN: [id number] DOB: [birth year]   Care Coordination Outreach Attempts:  A second unsuccessful outreach was attempted today to offer the patient with information about available care coordination services as a benefit of their health plan.     Follow Up Plan:  Additional outreach attempts will be made to offer the patient care coordination information and services.   Encounter Outcome:  No Answer  Care Coordination Interventions Activated:  No   Care Coordination Interventions:  No, not indicated    Raina Mina, RN Care Management Coordinator [place] Office [phone]

## 2022-03-14 ENCOUNTER — Telehealth: Payer: Self-pay | Admitting: *Deleted

## 2022-03-14 ENCOUNTER — Encounter: Payer: Self-pay | Admitting: *Deleted

## 2022-03-14 NOTE — Patient Outreach (Signed)
   Care Coordination   Initial Visit Note   03/14/2022 Name: Isaac Phelps MRN: [id number] DOB: 02-18-1931  Isaac Phelps is a 88 y.o. year old male who sees Nafziger, Tommi Rumps, NP for primary care. I spoke with  DPR daughter Isaac Phelps  What matters to the patients health and wellness today?  No needs today    Goals Addressed               This Visit's Progress     COMPLETED: "nothing at this time" (pt-stated)        Care Coordination Interventions: Reviewed medications with patient and discussed adherence with all medications Reviewed scheduled/upcoming provider appointments including pending appointments and verified pt completed his AWV Assessed social determinant of health barriers          SDOH assessments and interventions completed:  Yes  SDOH Interventions Today    Flowsheet Row Most Recent Value  SDOH Interventions   Food Insecurity Interventions Intervention Not Indicated  Housing Interventions Intervention Not Indicated  Transportation Interventions Intervention Not Indicated  Utilities Interventions Intervention Not Indicated        Care Coordination Interventions Activated:  Yes  Care Coordination Interventions:  Yes, provided   Follow up plan: No further intervention required.   Encounter Outcome:  Pt. Visit Completed   Raina Mina, RN Care Management Coordinator [place] Office [phone]

## 2022-03-14 NOTE — Patient Instructions (Signed)
Visit Information  Thank you for taking time to visit with me today. Please don't hesitate to contact me if I can be of assistance to you.   Following are the goals we discussed today:   Goals Addressed               This Visit's Progress     COMPLETED: "nothing at this time" (pt-stated)        Care Coordination Interventions: Reviewed medications with patient and discussed adherence with all medications Reviewed scheduled/upcoming provider appointments including pending appointments and verified pt completed his AWV Assessed social determinant of health barriers          Please call the care guide team at [phone] if you need to cancel or reschedule your appointment.   If you are experiencing a Mental Health or [place] or need someone to talk to, please call the Suicide and Crisis Lifeline: 988 call the [place] National Suicide Prevention Lifeline: 716-197-3264 or TTY: 314-070-9277 TTY 828-788-9992) to talk to a trained counselor  Patient verbalizes understanding of instructions and care plan provided today and agrees to view in [place]. Active MyChart status and patient understanding of how to access instructions and care plan via MyChart confirmed with patient.     No further follow up required: No needs  Raina Mina, RN Care Management Coordinator [place] Office [phone]

## 2022-05-14 ENCOUNTER — Inpatient Hospital Stay (HOSPITAL_BASED_OUTPATIENT_CLINIC_OR_DEPARTMENT_OTHER): Payer: Medicare Other | Admitting: Oncology

## 2022-05-14 ENCOUNTER — Inpatient Hospital Stay: Payer: Medicare Other | Attending: Radiation Oncology

## 2022-05-14 VITALS — BP 156/80 | HR 65 | Temp 98.2°F | Resp 18 | Ht 69.0 in | Wt 181.0 lb

## 2022-05-14 DIAGNOSIS — N400 Enlarged prostate without lower urinary tract symptoms: Secondary | ICD-10-CM | POA: Diagnosis not present

## 2022-05-14 DIAGNOSIS — R5381 Other malaise: Secondary | ICD-10-CM | POA: Diagnosis not present

## 2022-05-14 DIAGNOSIS — Z79899 Other long term (current) drug therapy: Secondary | ICD-10-CM | POA: Insufficient documentation

## 2022-05-14 DIAGNOSIS — K5900 Constipation, unspecified: Secondary | ICD-10-CM | POA: Insufficient documentation

## 2022-05-14 DIAGNOSIS — I10 Essential (primary) hypertension: Secondary | ICD-10-CM | POA: Diagnosis not present

## 2022-05-14 DIAGNOSIS — D509 Iron deficiency anemia, unspecified: Secondary | ICD-10-CM | POA: Diagnosis present

## 2022-05-14 DIAGNOSIS — I2510 Atherosclerotic heart disease of native coronary artery without angina pectoris: Secondary | ICD-10-CM | POA: Insufficient documentation

## 2022-05-14 DIAGNOSIS — M109 Gout, unspecified: Secondary | ICD-10-CM | POA: Insufficient documentation

## 2022-05-14 DIAGNOSIS — E538 Deficiency of other specified B group vitamins: Secondary | ICD-10-CM | POA: Diagnosis present

## 2022-05-14 DIAGNOSIS — Z85048 Personal history of other malignant neoplasm of rectum, rectosigmoid junction, and anus: Secondary | ICD-10-CM | POA: Diagnosis present

## 2022-05-14 DIAGNOSIS — C20 Malignant neoplasm of rectum: Secondary | ICD-10-CM

## 2022-05-14 LAB — CBC WITH DIFFERENTIAL (CANCER CENTER ONLY)
Abs Immature Granulocytes: 0.02 10*3/uL (ref 0.00–0.07)
Basophils Absolute: 0 10*3/uL (ref 0.0–0.1)
Basophils Relative: 1 %
Eosinophils Absolute: 0.1 10*3/uL (ref 0.0–0.5)
Eosinophils Relative: 1 %
HCT: 29.2 % — ABNORMAL LOW (ref 39.0–52.0)
Hemoglobin: 8.9 g/dL — ABNORMAL LOW (ref 13.0–17.0)
Immature Granulocytes: 0 %
Lymphocytes Relative: 10 %
Lymphs Abs: 0.6 10*3/uL — ABNORMAL LOW (ref 0.7–4.0)
MCH: 22.9 pg — ABNORMAL LOW (ref 26.0–34.0)
MCHC: 30.5 g/dL (ref 30.0–36.0)
MCV: 75.3 fL — ABNORMAL LOW (ref 80.0–100.0)
Monocytes Absolute: 0.4 10*3/uL (ref 0.1–1.0)
Monocytes Relative: 7 %
Neutro Abs: 5 10*3/uL (ref 1.7–7.7)
Neutrophils Relative %: 81 %
Platelet Count: 274 10*3/uL (ref 150–400)
RBC: 3.88 MIL/uL — ABNORMAL LOW (ref 4.22–5.81)
RDW: 17.5 % — ABNORMAL HIGH (ref 11.5–15.5)
WBC Count: 6.2 10*3/uL (ref 4.0–10.5)
nRBC: 0 % (ref 0.0–0.2)

## 2022-05-14 LAB — FERRITIN: Ferritin: 8 ng/mL — ABNORMAL LOW (ref 24–336)

## 2022-05-14 LAB — CEA (ACCESS): CEA (CHCC): 5.53 ng/mL — ABNORMAL HIGH (ref 0.00–5.00)

## 2022-05-14 NOTE — Progress Notes (Signed)
[place] OFFICE PROGRESS NOTE   Diagnosis: Rectal cancer, anemia  INTERVAL HISTORY:   Mr. Mcgillicuddy returns as scheduled.  He is not taking iron consistently.  He reports iron causes constipation.  No bleeding.  He generally feels well, but he reports malaise.  Objective:  Vital signs in last 24 hours:  Blood pressure (!) 156/80, pulse 65, temperature 98.2 F (36.8 C), temperature source Oral, resp. rate 18, height '5\' 9"'$  (1.753 m), weight 181 lb (82.1 kg), SpO2 99 %.    Lymphatics: No cervical, supraclavicular, axillary, or inguinal nodes Resp: Lungs clear bilaterally Cardio: Regular rate and rhythm GI: No hepatosplenomegaly, no mass, nontender, reducible bilateral inguinal hernias, rectal exam declined Vascular: No leg edema    Lab Results:  Lab Results  Component Value Date   WBC 6.2 05/14/2022   HGB 8.9 (L) 05/14/2022   HCT 29.2 (L) 05/14/2022   MCV 75.3 (L) 05/14/2022   PLT 274 05/14/2022   NEUTROABS 5.0 05/14/2022    CMP  Lab Results  Component Value Date   NA 136 11/01/2021   K 4.4 11/01/2021   CL 105 11/01/2021   CO2 24 11/01/2021   GLUCOSE 109 (H) 11/01/2021   BUN 34 (H) 11/01/2021   CREATININE 2.27 (H) 11/01/2021   CALCIUM 9.2 11/01/2021   PROT 8.0 11/01/2021   ALBUMIN 4.1 11/01/2021   AST 28 11/01/2021   ALT 11 11/01/2021   ALKPHOS 107 11/01/2021   BILITOT 0.4 11/01/2021   GFRNONAA 31 (L) 10/21/2020   GFRAA 42 (L) 02/14/2015    Lab Results  Component Value Date   CEA1 3.59 02/28/2021   CEA 4.65 11/09/2021    No results found for: "INR", "LABPROT"  Imaging:  No results found.  Medications: I have reviewed the patient's current medications.   Assessment/Plan: Rectal cancer Colonoscopy 08/03/2020-distal rectal mass abutting the anus, biopsy confirmed adenocarcinoma CT angiography 07/29/2020-hazy soft tissue at the porta hepatis encasing the right hepatic artery, exophytic soft tissue lesion at the upper pole of the left  kidney-indeterminate, partially solid nodule in the left lower lobe-present on a CT in 2013 suspicious, suspicious for a low-grade pulmonary neoplasm, enlarged prostate, MRI abdomen 07/30/2020-no evidence of mass in the porta hepatis, hypoenhancing lesions in the upper pole of the left kidney and interpolar region of the right kidney suspicious for small neoplasms, hemosiderosis of the liver and spleen MRI pelvis 08/17/2020-T2, N0; minimal if any distance from tumor to internal anal sphincter Radiation and capecitabine 09/12/2020-10/21/2020 Sigmoidoscopy 12/08/2020-normal rectum and sigmoid, no tumor seen, slight mucosal edema and loss of vascular pattern in the distal rectum, biopsy-benign colonic mucosa, no malignancy Enlarged prostate with an elevated PSA CVAs confirmed on brain MRI 07/31/2020-maintained on aspirin Hypertension Gout History of coronary artery disease Iron deficiency anemia-Feraheme 07/30/2020 Ferrous sulfate 11/09/2021 B12 deficiency-07/31/2020 B12 161, vitamin B12 therapy resumed 12/19/2020 Left lower lobe nodule on CT 07/29/2020    Disposition: Mr. Bhagat has a history of rectal cancer.  Completed a course of radiation and capecitabine in May 2022.  He remains in clinical remission.  He declines a surveillance sigmoidoscopy.  He has iron deficiency anemia, likely related to bleeding from somewhere in the GI tract.  He has constipation with oral iron and he is not taking iron consistently.  He has malaise.  He agrees to a trial of IV iron.  He received Feraheme while hospitalized in February 2022.  He will be scheduled for Feraheme next week.  Mr. Wollman was noted to have  a lung nodule on a CT in February 2022.  He has not undergone follow-up imaging.  He agrees to a follow-up chest CT.  Mr. Misner will return for an office visit in 3 months.  Will see him sooner as needed.  Betsy Coder, MD  05/14/2022  12:11 PM

## 2022-05-22 ENCOUNTER — Inpatient Hospital Stay: Payer: Medicare Other

## 2022-05-22 VITALS — BP 172/62 | HR 83 | Temp 98.1°F | Resp 18

## 2022-05-22 DIAGNOSIS — D500 Iron deficiency anemia secondary to blood loss (chronic): Secondary | ICD-10-CM

## 2022-05-22 DIAGNOSIS — Z85048 Personal history of other malignant neoplasm of rectum, rectosigmoid junction, and anus: Secondary | ICD-10-CM | POA: Diagnosis not present

## 2022-05-22 MED ORDER — SODIUM CHLORIDE 0.9 % IV SOLN: Freq: Once | INTRAVENOUS | Status: AC

## 2022-05-22 MED ORDER — SODIUM CHLORIDE 0.9 % IV SOLN
510.0000 mg | Freq: Once | INTRAVENOUS | Status: AC
  Administered 2022-05-22: 510 mg via INTRAVENOUS
  Filled 2022-05-22: qty 510

## 2022-05-22 NOTE — Patient Instructions (Signed)

## 2022-05-31 ENCOUNTER — Ambulatory Visit (HOSPITAL_BASED_OUTPATIENT_CLINIC_OR_DEPARTMENT_OTHER)
Admission: RE | Admit: 2022-05-31 | Discharge: 2022-05-31 | Disposition: A | Discharge status: Home / Self Care | Payer: Medicare Other | Source: Ambulatory Visit | Attending: Oncology | Admitting: Oncology

## 2022-05-31 ENCOUNTER — Inpatient Hospital Stay: Payer: Medicare Other

## 2022-05-31 VITALS — BP 171/63 | HR 55 | Temp 98.0°F | Resp 18 | Ht 69.0 in | Wt 179.4 lb

## 2022-05-31 DIAGNOSIS — D500 Iron deficiency anemia secondary to blood loss (chronic): Secondary | ICD-10-CM

## 2022-05-31 DIAGNOSIS — C20 Malignant neoplasm of rectum: Secondary | ICD-10-CM | POA: Diagnosis not present

## 2022-05-31 MED ORDER — SODIUM CHLORIDE 0.9 % IV SOLN
510.0000 mg | Freq: Once | INTRAVENOUS | Status: AC
  Administered 2022-05-31: 510 mg via INTRAVENOUS
  Filled 2022-05-31: qty 510

## 2022-05-31 MED ORDER — SODIUM CHLORIDE 0.9 % IV SOLN: Freq: Once | INTRAVENOUS | Status: AC

## 2022-05-31 NOTE — Progress Notes (Signed)
Noted that patient blood pressure has been elevated for some time. Advised patient that he should follow up with his primary physician, Dorothyann Peng.  Pt in agreement.

## 2022-05-31 NOTE — Patient Instructions (Signed)

## 2022-06-08 NOTE — Progress Notes (Signed)
I called several time no answer, I can't leave a message. I mailed out an appt.  Reminder to come on 1/16 with [place].

## 2022-06-11 ENCOUNTER — Telehealth: Payer: Self-pay | Admitting: *Deleted

## 2022-06-11 NOTE — Telephone Encounter (Signed)
-----   Message from Ladell Pier, MD sent at 06/03/2022  3:51 PM EST ----- Please call patient, nodule in left lung has increased slightly since 2022, could be a slow growing lung cancer, has a new smaller nodule in right lung which could be an early lung cancer, schedule an appt next 2-3 weeks to review scans, and decide on further evaluation

## 2022-06-11 NOTE — Telephone Encounter (Signed)
Multiple attempts to reach [name] re: CT results and need for OV to discuss. Letter was mailed to patient on 1/5 w/request to call office.

## 2022-06-15 ENCOUNTER — Telehealth: Payer: Self-pay

## 2022-06-15 NOTE — Telephone Encounter (Signed)
-----  Message from Ladell Pier, MD sent at 06/03/2022  3:51 PM EST ----- Please call patient, nodule in left lung has increased slightly since 2022, could be a slow growing lung cancer, has a new smaller nodule in right lung which could be an early lung cancer, schedule an appt next 2-3 weeks to review scans, and decide on further evaluation

## 2022-06-15 NOTE — Telephone Encounter (Signed)
I spoke with  Isaac Phelps and he confirm his appointment.

## 2022-06-19 ENCOUNTER — Inpatient Hospital Stay: Payer: Medicare Other | Admitting: Oncology

## 2022-07-12 ENCOUNTER — Inpatient Hospital Stay: Payer: Medicare Other | Attending: Radiation Oncology | Admitting: Oncology

## 2022-07-12 ENCOUNTER — Inpatient Hospital Stay: Payer: Medicare Other

## 2022-07-12 VITALS — BP 172/74 | HR 75 | Temp 98.1°F | Resp 20 | Ht 69.0 in | Wt 183.0 lb

## 2022-07-12 DIAGNOSIS — M109 Gout, unspecified: Secondary | ICD-10-CM | POA: Insufficient documentation

## 2022-07-12 DIAGNOSIS — I10 Essential (primary) hypertension: Secondary | ICD-10-CM | POA: Diagnosis not present

## 2022-07-12 DIAGNOSIS — I2510 Atherosclerotic heart disease of native coronary artery without angina pectoris: Secondary | ICD-10-CM | POA: Diagnosis not present

## 2022-07-12 DIAGNOSIS — Z85048 Personal history of other malignant neoplasm of rectum, rectosigmoid junction, and anus: Secondary | ICD-10-CM | POA: Diagnosis present

## 2022-07-12 DIAGNOSIS — N400 Enlarged prostate without lower urinary tract symptoms: Secondary | ICD-10-CM | POA: Diagnosis not present

## 2022-07-12 DIAGNOSIS — D509 Iron deficiency anemia, unspecified: Secondary | ICD-10-CM | POA: Insufficient documentation

## 2022-07-12 DIAGNOSIS — Z79899 Other long term (current) drug therapy: Secondary | ICD-10-CM | POA: Diagnosis not present

## 2022-07-12 DIAGNOSIS — C20 Malignant neoplasm of rectum: Secondary | ICD-10-CM

## 2022-07-12 DIAGNOSIS — Z7982 Long term (current) use of aspirin: Secondary | ICD-10-CM | POA: Insufficient documentation

## 2022-07-12 DIAGNOSIS — E538 Deficiency of other specified B group vitamins: Secondary | ICD-10-CM | POA: Diagnosis present

## 2022-07-12 DIAGNOSIS — D500 Iron deficiency anemia secondary to blood loss (chronic): Secondary | ICD-10-CM | POA: Diagnosis not present

## 2022-07-12 DIAGNOSIS — R911 Solitary pulmonary nodule: Secondary | ICD-10-CM

## 2022-07-12 DIAGNOSIS — R918 Other nonspecific abnormal finding of lung field: Secondary | ICD-10-CM | POA: Insufficient documentation

## 2022-07-12 LAB — CBC WITH DIFFERENTIAL (CANCER CENTER ONLY)
Abs Immature Granulocytes: 0.02 10*3/uL (ref 0.00–0.07)
Basophils Absolute: 0 10*3/uL (ref 0.0–0.1)
Basophils Relative: 1 %
Eosinophils Absolute: 0.1 10*3/uL (ref 0.0–0.5)
Eosinophils Relative: 3 %
HCT: 33.7 % — ABNORMAL LOW (ref 39.0–52.0)
Hemoglobin: 10.4 g/dL — ABNORMAL LOW (ref 13.0–17.0)
Immature Granulocytes: 0 %
Lymphocytes Relative: 9 %
Lymphs Abs: 0.5 10*3/uL — ABNORMAL LOW (ref 0.7–4.0)
MCH: 24.8 pg — ABNORMAL LOW (ref 26.0–34.0)
MCHC: 30.9 g/dL (ref 30.0–36.0)
MCV: 80.4 fL (ref 80.0–100.0)
Monocytes Absolute: 0.4 10*3/uL (ref 0.1–1.0)
Monocytes Relative: 7 %
Neutro Abs: 4.2 10*3/uL (ref 1.7–7.7)
Neutrophils Relative %: 80 %
Platelet Count: 167 10*3/uL (ref 150–400)
RBC: 4.19 MIL/uL — ABNORMAL LOW (ref 4.22–5.81)
RDW: 22.3 % — ABNORMAL HIGH (ref 11.5–15.5)
WBC Count: 5.2 10*3/uL (ref 4.0–10.5)
nRBC: 0 % (ref 0.0–0.2)

## 2022-07-12 LAB — FERRITIN: Ferritin: 92 ng/mL (ref 24–336)

## 2022-07-12 NOTE — Progress Notes (Signed)
[facility] OFFICE PROGRESS NOTE   Diagnosis: Rectal cancer  INTERVAL HISTORY:   Isaac Phelps returns as scheduled.  He reports feeling well.  He received IV iron in December.  He tolerated the IV iron well.  No bleeding.  No new complaint.  He takes iron intermittently.  Objective:  Vital signs in last 24 hours:  Blood pressure (!) 172/74, pulse 75, temperature 98.1 F (36.7 C), temperature source Oral, resp. rate 20, height '5\' 9"'$  (1.753 m), weight 183 lb (83 kg), SpO2 100 %.    Lymphatics: No cervical, supraclavicular, axillary, or inguinal nodes Resp: Clear bilaterally Cardio: Regular rate and rhythm GI: No hepatosplenomegaly, reducible bilateral inguinal hernia Vascular: No leg edema    Portacath/PICC-without erythema  Lab Results:  Lab Results  Component Value Date   WBC 6.2 05/14/2022   HGB 8.9 (L) 05/14/2022   HCT 29.2 (L) 05/14/2022   MCV 75.3 (L) 05/14/2022   PLT 274 05/14/2022   NEUTROABS 5.0 05/14/2022    CMP  Lab Results  Component Value Date   NA 136 11/01/2021   K 4.4 11/01/2021   CL 105 11/01/2021   CO2 24 11/01/2021   GLUCOSE 109 (H) 11/01/2021   BUN 34 (H) 11/01/2021   CREATININE 2.27 (H) 11/01/2021   CALCIUM 9.2 11/01/2021   PROT 8.0 11/01/2021   ALBUMIN 4.1 11/01/2021   AST 28 11/01/2021   ALT 11 11/01/2021   ALKPHOS 107 11/01/2021   BILITOT 0.4 11/01/2021   GFRNONAA 31 (L) 10/21/2020   GFRAA 42 (L) 02/14/2015    Lab Results  Component Value Date   CEA1 3.59 02/28/2021   CEA 5.53 (H) 05/14/2022     Medications: I have reviewed the patient's current medications.   Assessment/Plan: Rectal cancer Colonoscopy 08/03/2020-distal rectal mass abutting the anus, biopsy confirmed adenocarcinoma CT angiography 07/29/2020-hazy soft tissue at the porta hepatis encasing the right hepatic artery, exophytic soft tissue lesion at the upper pole of the left kidney-indeterminate, partially solid nodule in the left lower lobe-present  on a CT in 2013 suspicious, suspicious for a low-grade pulmonary neoplasm, enlarged prostate, MRI abdomen 07/30/2020-no evidence of mass in the porta hepatis, hypoenhancing lesions in the upper pole of the left kidney and interpolar region of the right kidney suspicious for small neoplasms, hemosiderosis of the liver and spleen MRI pelvis 08/17/2020-T2, N0; minimal if any distance from tumor to internal anal sphincter Radiation and capecitabine 09/12/2020-10/21/2020 Sigmoidoscopy 12/08/2020-normal rectum and sigmoid, no tumor seen, slight mucosal edema and loss of vascular pattern in the distal rectum, biopsy-benign colonic mucosa, no malignancy Enlarged prostate with an elevated PSA CVAs confirmed on brain MRI 07/31/2020-maintained on aspirin Hypertension Gout History of coronary artery disease Iron deficiency anemia-Feraheme 07/30/2020 Ferrous sulfate 11/09/2021 Feraheme 05/22/2022, 05/31/2022 B12 deficiency-07/31/2020 B12 161, vitamin B12 therapy resumed 12/19/2020 Left lower lobe nodule on CT 07/29/2020 Chest CT 05/31/2022-subsolid superior segment left lower lobe nodule increased slightly in size, new spiculated subsolid nodule in the right upper lobe, new groundglass nodule at the left upper lobe     Disposition: Isaac Phelps remains in clinical remission from rectal cancer.  He has a history of iron deficiency anemia.  He received IV iron in December.  He will return to the lab for a CBC and ferritin level today.  He declines further GI evaluation.  Isaac Phelps has multiple subsolid lung nodules.  A dominant nodule in the left lower lung has increased slightly compared to a CT from 2022.  These may represent  indolent primary lung cancers.  I think it is unlikely the lung nodules are related to rectal cancer.  We will make referral to pulmonary medicine to consider further evaluation.  He agrees to referral.  They will likely recommend a PET scan and then consider a biopsy.  I reviewed the CT findings  and images with him.  Isaac Phelps will return for an office visit in approximately 5 weeks.  Betsy Coder, MD  07/12/2022  9:42 AM

## 2022-07-13 ENCOUNTER — Encounter: Payer: Self-pay | Admitting: *Deleted

## 2022-08-16 ENCOUNTER — Telehealth: Payer: Self-pay | Admitting: Adult Health

## 2022-08-16 DIAGNOSIS — I10 Essential (primary) hypertension: Secondary | ICD-10-CM

## 2022-08-16 MED ORDER — HYDRALAZINE HCL 100 MG PO TABS: 100.0000 mg | ORAL_TABLET | Freq: Three times a day (TID) | ORAL | 1 refills | Status: DC

## 2022-08-16 MED ORDER — CARVEDILOL 3.125 MG PO TABS: 3.1250 mg | ORAL_TABLET | Freq: Two times a day (BID) | ORAL | 0 refills | Status: DC

## 2022-08-16 MED ORDER — AMLODIPINE BESYLATE 10 MG PO TABS: 10.0000 mg | ORAL_TABLET | Freq: Every day | ORAL | 0 refills | Status: DC

## 2022-08-16 NOTE — Telephone Encounter (Signed)
Rx refilled.

## 2022-08-16 NOTE — Telephone Encounter (Signed)
Requesting refill of carvedilol (COREG) 3.125 MG tablet (Expired) amLODipine (NORVASC) 10 MG tablet (Expired) hydrALAZINE (APRESOLINE) 100 MG tablet (Expired)  [facility] [id number], [place] AT [facility] Phone: [phone]  Fax: [phone]

## 2022-08-16 NOTE — Addendum Note (Signed)
Addended by: Gwenyth Ober R on: 08/16/2022 03:51 PM   Modules accepted: Orders

## 2022-08-23 ENCOUNTER — Inpatient Hospital Stay: Payer: Medicare Other

## 2022-08-23 ENCOUNTER — Encounter: Payer: Self-pay | Admitting: *Deleted

## 2022-08-23 ENCOUNTER — Inpatient Hospital Stay: Payer: Medicare Other | Admitting: Oncology

## 2022-08-23 NOTE — Progress Notes (Signed)
Multiple attempts to reach Mr. Ruane regarding Dr. Juline Patch office has been trying to reach him for appointment. Mailed contact information for Dr. Valeta Harms for Mr. Dukes to call.

## 2022-09-07 ENCOUNTER — Inpatient Hospital Stay: Payer: Medicare Other

## 2022-09-07 ENCOUNTER — Inpatient Hospital Stay: Payer: Medicare Other | Admitting: Oncology

## 2022-09-26 ENCOUNTER — Inpatient Hospital Stay: Payer: Medicare Other | Attending: Radiation Oncology

## 2022-09-26 ENCOUNTER — Other Ambulatory Visit: Payer: Self-pay | Admitting: *Deleted

## 2022-09-26 ENCOUNTER — Telehealth: Payer: Self-pay | Admitting: *Deleted

## 2022-09-26 ENCOUNTER — Inpatient Hospital Stay (HOSPITAL_BASED_OUTPATIENT_CLINIC_OR_DEPARTMENT_OTHER): Payer: Medicare Other | Admitting: Oncology

## 2022-09-26 VITALS — BP 165/78 | HR 76 | Temp 98.1°F | Resp 18 | Ht 69.0 in | Wt 185.3 lb

## 2022-09-26 DIAGNOSIS — N400 Enlarged prostate without lower urinary tract symptoms: Secondary | ICD-10-CM | POA: Insufficient documentation

## 2022-09-26 DIAGNOSIS — M109 Gout, unspecified: Secondary | ICD-10-CM | POA: Insufficient documentation

## 2022-09-26 DIAGNOSIS — D500 Iron deficiency anemia secondary to blood loss (chronic): Secondary | ICD-10-CM

## 2022-09-26 DIAGNOSIS — E538 Deficiency of other specified B group vitamins: Secondary | ICD-10-CM

## 2022-09-26 DIAGNOSIS — D509 Iron deficiency anemia, unspecified: Secondary | ICD-10-CM | POA: Insufficient documentation

## 2022-09-26 DIAGNOSIS — Z8673 Personal history of transient ischemic attack (TIA), and cerebral infarction without residual deficits: Secondary | ICD-10-CM | POA: Insufficient documentation

## 2022-09-26 DIAGNOSIS — I10 Essential (primary) hypertension: Secondary | ICD-10-CM | POA: Diagnosis not present

## 2022-09-26 DIAGNOSIS — I2510 Atherosclerotic heart disease of native coronary artery without angina pectoris: Secondary | ICD-10-CM | POA: Diagnosis not present

## 2022-09-26 DIAGNOSIS — Z85048 Personal history of other malignant neoplasm of rectum, rectosigmoid junction, and anus: Secondary | ICD-10-CM | POA: Diagnosis present

## 2022-09-26 DIAGNOSIS — R911 Solitary pulmonary nodule: Secondary | ICD-10-CM

## 2022-09-26 DIAGNOSIS — Z79899 Other long term (current) drug therapy: Secondary | ICD-10-CM | POA: Insufficient documentation

## 2022-09-26 DIAGNOSIS — Z7982 Long term (current) use of aspirin: Secondary | ICD-10-CM | POA: Diagnosis not present

## 2022-09-26 DIAGNOSIS — C20 Malignant neoplasm of rectum: Secondary | ICD-10-CM

## 2022-09-26 LAB — CBC WITH DIFFERENTIAL (CANCER CENTER ONLY)
Abs Immature Granulocytes: 0.06 10*3/uL (ref 0.00–0.07)
Basophils Absolute: 0 10*3/uL (ref 0.0–0.1)
Basophils Relative: 1 %
Eosinophils Absolute: 0.1 10*3/uL (ref 0.0–0.5)
Eosinophils Relative: 2 %
HCT: 31 % — ABNORMAL LOW (ref 39.0–52.0)
Hemoglobin: 9.7 g/dL — ABNORMAL LOW (ref 13.0–17.0)
Immature Granulocytes: 1 %
Lymphocytes Relative: 10 %
Lymphs Abs: 0.6 10*3/uL — ABNORMAL LOW (ref 0.7–4.0)
MCH: 26.8 pg (ref 26.0–34.0)
MCHC: 31.3 g/dL (ref 30.0–36.0)
MCV: 85.6 fL (ref 80.0–100.0)
Monocytes Absolute: 0.5 10*3/uL (ref 0.1–1.0)
Monocytes Relative: 9 %
Neutro Abs: 4.6 10*3/uL (ref 1.7–7.7)
Neutrophils Relative %: 77 %
Platelet Count: 232 10*3/uL (ref 150–400)
RBC: 3.62 MIL/uL — ABNORMAL LOW (ref 4.22–5.81)
RDW: 16.3 % — ABNORMAL HIGH (ref 11.5–15.5)
WBC Count: 5.9 10*3/uL (ref 4.0–10.5)
nRBC: 0 % (ref 0.0–0.2)

## 2022-09-26 LAB — VITAMIN B12: Vitamin B-12: 264 pg/mL (ref 180–914)

## 2022-09-26 LAB — FERRITIN: Ferritin: 44 ng/mL (ref 24–336)

## 2022-09-26 NOTE — Progress Notes (Signed)
[name] OFFICE PROGRESS NOTE   Diagnosis: Rectal cancer, iron deficiency anemia  INTERVAL HISTORY:   Isaac Phelps returns as scheduled.  He feels well.  No difficulty with bowel function.  No bleeding.  He decided against evaluation by pulmonary medicine.  Objective:  Vital signs in last 24 hours:  Blood pressure (!) 165/78, pulse 76, temperature 98.1 F (36.7 C), resp. rate 18, height  (1.753 m), weight 185 lb 4.8 oz (84.1 kg), SpO2 99 %.   Lymphatics: No cervical, supraclavicular, axillary, or inguinal nodes Resp: Coarse and inspiratory rhonchi at the lower posterior chest bilaterally, no respiratory distress Cardio: Regular rate and rhythm GI: No hepatosplenomegaly, no mass, rectal exam-declined Vascular: Trace edema at the left greater than right lower leg   Lab Results:  Lab Results  Component Value Date   WBC 5.9 09/26/2022   HGB 9.7 (L) 09/26/2022   HCT 31.0 (L) 09/26/2022   MCV 85.6 09/26/2022   PLT 232 09/26/2022   NEUTROABS 4.6 09/26/2022    CMP  Lab Results  Component Value Date   NA 136 11/01/2021   K 4.4 11/01/2021   CL 105 11/01/2021   CO2 24 11/01/2021   GLUCOSE 109 (H) 11/01/2021   BUN 34 (H) 11/01/2021   CREATININE 2.27 (H) 11/01/2021   CALCIUM 9.2 11/01/2021   PROT 8.0 11/01/2021   ALBUMIN 4.1 11/01/2021   AST 28 11/01/2021   ALT 11 11/01/2021   ALKPHOS 107 11/01/2021   BILITOT 0.4 11/01/2021   GFRNONAA 31 (L) 10/21/2020   GFRAA 42 (L) 02/14/2015    Lab Results  Component Value Date   CEA1 3.59 02/28/2021   CEA 5.53 (H) 05/14/2022   iewed the patient's current medications.   Assessment/Plan: Rectal cancer Colonoscopy 08/03/2020-distal rectal mass abutting the anus, biopsy confirmed adenocarcinoma CT angiography 07/29/2020-hazy soft tissue at the porta hepatis encasing the right hepatic artery, exophytic soft tissue lesion at the upper pole of the left kidney-indeterminate, partially solid nodule in the left  lower lobe-present on a CT in 2013 suspicious, suspicious for a low-grade pulmonary neoplasm, enlarged prostate, MRI abdomen 07/30/2020-no evidence of mass in the porta hepatis, hypoenhancing lesions in the upper pole of the left kidney and interpolar region of the right kidney suspicious for small neoplasms, hemosiderosis of the liver and spleen MRI pelvis 08/17/2020-T2, N0; minimal if any distance from tumor to internal anal sphincter Radiation and capecitabine 09/12/2020-10/21/2020 Sigmoidoscopy 12/08/2020-normal rectum and sigmoid, no tumor seen, slight mucosal edema and loss of vascular pattern in the distal rectum, biopsy-benign colonic mucosa, no malignancy Enlarged prostate with an elevated PSA CVAs confirmed on brain MRI 07/31/2020-maintained on aspirin Hypertension Gout History of coronary artery disease Iron deficiency anemia-Feraheme 07/30/2020 Ferrous sulfate 11/09/2021 Feraheme 05/22/2022, 05/31/2022 B12 deficiency-07/31/2020 B12 161, vitamin B12 therapy resumed 12/19/2020 Left lower lobe nodule on CT 07/29/2020 Chest CT 05/31/2022-subsolid superior segment left lower lobe nodule increased slightly in size, new spiculated subsolid nodule in the right upper lobe, new groundglass nodule at the left upper lobe      Disposition: Isaac Phelps has a history of rectal cancer.  He is in clinical remission.  He was found to have multiple lung nodules on a chest CT in December.  He declines a referral to pulmonary medicine.  He agrees to a surveillance chest CT.  He will be scheduled for a CT chest and office visit in 2 months.  Isaac Phelps has persistent anemia.  We will follow-up on the ferritin level from  today and arrange for IV iron as indicated.  He declines oral iron replacement.  He has a history of B12 deficiency and is not taking vitamin B12.  We will check a B12 level today.    Thornton Papas, MD  09/26/2022  9:15 AM

## 2022-09-26 NOTE — Telephone Encounter (Signed)
-----   Message from Ladene Artist, MD sent at 09/26/2022  3:44 PM EDT ----- Please call patient, iron level and vitamin B12 level are normal, is he taking vitamin B12?,  Follow-up as scheduled

## 2022-09-28 ENCOUNTER — Telehealth: Payer: Self-pay

## 2022-09-28 NOTE — Telephone Encounter (Signed)
-----   Message from Gary B Sherrill, MD sent at 09/26/2022  3:44 PM EDT ----- Please call patient, iron level and vitamin B12 level are normal, is he taking vitamin B12?,  Follow-up as scheduled  

## 2022-09-28 NOTE — Telephone Encounter (Signed)
I attempted to contact the patient multiple times with no response, so I contacted the daughter instead. She confirmed that vitamin B12 was on the table, but mentioned that her father typically does not like taking medication. Therefore, it is likely that he is not taking the vitamin B12. She indicated understanding verbally and did not have any additional questions or concerns.

## 2022-10-26 ENCOUNTER — Telehealth: Payer: Self-pay | Admitting: *Deleted

## 2022-10-26 NOTE — Telephone Encounter (Signed)
Attempting to reach Isaac Phelps or his daughter to schedule June CT scan. Requested return call

## 2022-11-05 ENCOUNTER — Encounter: Payer: Self-pay | Admitting: *Deleted

## 2022-11-09 ENCOUNTER — Telehealth: Payer: Self-pay | Admitting: *Deleted

## 2022-11-09 NOTE — Telephone Encounter (Signed)
Called his relative, Asher Muir w/CT appointment on 6/17 after lab. He will inform Mr. Newey and confirmed he is the driver for his appointments that day.

## 2022-11-19 ENCOUNTER — Telehealth: Payer: Self-pay | Admitting: *Deleted

## 2022-11-19 ENCOUNTER — Inpatient Hospital Stay (HOSPITAL_BASED_OUTPATIENT_CLINIC_OR_DEPARTMENT_OTHER): Payer: Medicare Other | Admitting: Oncology

## 2022-11-19 ENCOUNTER — Ambulatory Visit (HOSPITAL_BASED_OUTPATIENT_CLINIC_OR_DEPARTMENT_OTHER)
Admission: RE | Admit: 2022-11-19 | Discharge: 2022-11-19 | Disposition: A | Discharge status: Home / Self Care | Payer: Medicare Other | Source: Ambulatory Visit | Attending: Oncology | Admitting: Oncology

## 2022-11-19 ENCOUNTER — Inpatient Hospital Stay: Payer: Medicare Other | Attending: Radiation Oncology

## 2022-11-19 VITALS — BP 145/70 | HR 75 | Temp 98.2°F | Resp 18 | Ht 69.0 in | Wt 183.0 lb

## 2022-11-19 DIAGNOSIS — R911 Solitary pulmonary nodule: Secondary | ICD-10-CM

## 2022-11-19 DIAGNOSIS — D500 Iron deficiency anemia secondary to blood loss (chronic): Secondary | ICD-10-CM | POA: Insufficient documentation

## 2022-11-19 DIAGNOSIS — I2510 Atherosclerotic heart disease of native coronary artery without angina pectoris: Secondary | ICD-10-CM | POA: Insufficient documentation

## 2022-11-19 DIAGNOSIS — R918 Other nonspecific abnormal finding of lung field: Secondary | ICD-10-CM | POA: Diagnosis not present

## 2022-11-19 DIAGNOSIS — Z85048 Personal history of other malignant neoplasm of rectum, rectosigmoid junction, and anus: Secondary | ICD-10-CM | POA: Diagnosis present

## 2022-11-19 DIAGNOSIS — D509 Iron deficiency anemia, unspecified: Secondary | ICD-10-CM | POA: Diagnosis present

## 2022-11-19 DIAGNOSIS — I700 Atherosclerosis of aorta: Secondary | ICD-10-CM | POA: Insufficient documentation

## 2022-11-19 DIAGNOSIS — E538 Deficiency of other specified B group vitamins: Secondary | ICD-10-CM | POA: Insufficient documentation

## 2022-11-19 LAB — CBC WITH DIFFERENTIAL (CANCER CENTER ONLY)
Abs Immature Granulocytes: 0.04 10*3/uL (ref 0.00–0.07)
Basophils Absolute: 0 10*3/uL (ref 0.0–0.1)
Basophils Relative: 0 %
Eosinophils Absolute: 0.2 10*3/uL (ref 0.0–0.5)
Eosinophils Relative: 2 %
HCT: 30.3 % — ABNORMAL LOW (ref 39.0–52.0)
Hemoglobin: 9.5 g/dL — ABNORMAL LOW (ref 13.0–17.0)
Immature Granulocytes: 1 %
Lymphocytes Relative: 6 %
Lymphs Abs: 0.4 10*3/uL — ABNORMAL LOW (ref 0.7–4.0)
MCH: 26.2 pg (ref 26.0–34.0)
MCHC: 31.4 g/dL (ref 30.0–36.0)
MCV: 83.5 fL (ref 80.0–100.0)
Monocytes Absolute: 0.7 10*3/uL (ref 0.1–1.0)
Monocytes Relative: 10 %
Neutro Abs: 5.4 10*3/uL (ref 1.7–7.7)
Neutrophils Relative %: 81 %
Platelet Count: 227 10*3/uL (ref 150–400)
RBC: 3.63 MIL/uL — ABNORMAL LOW (ref 4.22–5.81)
RDW: 15 % (ref 11.5–15.5)
WBC Count: 6.7 10*3/uL (ref 4.0–10.5)
nRBC: 0 % (ref 0.0–0.2)

## 2022-11-19 LAB — FERRITIN: Ferritin: 21 ng/mL — ABNORMAL LOW (ref 24–336)

## 2022-11-19 NOTE — Progress Notes (Signed)
[facility] OFFICE PROGRESS NOTE   Diagnosis: Rectal cancer, iron deficiency anemia, lung nodules  INTERVAL HISTORY:   Isaac Phelps returns as scheduled.  He reports feeling well.  No complaint.  No bleeding.  He is not taking iron.  He is taking vitamin B12.  Objective:  Vital signs in last 24 hours:  Blood pressure (!) 145/70, pulse 75, temperature 98.2 F (36.8 C), temperature source Oral, resp. rate 18, height 5\' 9"  (1.753 m), weight 183 lb (83 kg), SpO2 98 %.    Lymphatics: No cervical, supraclavicular, axillary, or inguinal nodes Resp: Clear bilaterally Cardio: Regular rate and rhythm GI: Hepatosplenomegaly, no mass, nontender Vascular: No leg edema    Lab Results:  Lab Results  Component Value Date   WBC 6.7 11/19/2022   HGB 9.5 (L) 11/19/2022   HCT 30.3 (L) 11/19/2022   MCV 83.5 11/19/2022   PLT 227 11/19/2022   NEUTROABS 5.4 11/19/2022    CMP  Lab Results  Component Value Date   NA 136 11/01/2021   K 4.4 11/01/2021   CL 105 11/01/2021   CO2 24 11/01/2021   GLUCOSE 109 (H) 11/01/2021   BUN 34 (H) 11/01/2021   CREATININE 2.27 (H) 11/01/2021   CALCIUM 9.2 11/01/2021   PROT 8.0 11/01/2021   ALBUMIN 4.1 11/01/2021   AST 28 11/01/2021   ALT 11 11/01/2021   ALKPHOS 107 11/01/2021   BILITOT 0.4 11/01/2021   GFRNONAA 31 (L) 10/21/2020   GFRAA 42 (L) 02/14/2015    Lab Results  Component Value Date   CEA1 3.59 02/28/2021   CEA 5.53 (H) 05/14/2022    Medications: I have reviewed the patient's current medications.   Assessment/Plan: Rectal cancer Colonoscopy 08/03/2020-distal rectal mass abutting the anus, biopsy confirmed adenocarcinoma CT angiography 07/29/2020-hazy soft tissue at the porta hepatis encasing the right hepatic artery, exophytic soft tissue lesion at the upper pole of the left kidney-indeterminate, partially solid nodule in the left lower lobe-present on a CT in 2013 suspicious, suspicious for a low-grade pulmonary neoplasm,  enlarged prostate, MRI abdomen 07/30/2020-no evidence of mass in the porta hepatis, hypoenhancing lesions in the upper pole of the left kidney and interpolar region of the right kidney suspicious for small neoplasms, hemosiderosis of the liver and spleen MRI pelvis 08/17/2020-T2, N0; minimal if any distance from tumor to internal anal sphincter Radiation and capecitabine 09/12/2020-10/21/2020 Sigmoidoscopy 12/08/2020-normal rectum and sigmoid, no tumor seen, slight mucosal edema and loss of vascular pattern in the distal rectum, biopsy-benign colonic mucosa, no malignancy Enlarged prostate with an elevated PSA CVAs confirmed on brain MRI 07/31/2020-maintained on aspirin Hypertension Gout History of coronary artery disease Iron deficiency anemia-Feraheme 07/30/2020 Ferrous sulfate 11/09/2021 Feraheme 05/22/2022, 05/31/2022 B12 deficiency-07/31/2020 B12 161, vitamin B12 therapy resumed 12/19/2020 Left lower lobe nodule on CT 07/29/2020 Chest CT 05/31/2022-subsolid superior segment left lower lobe nodule increased slightly in size, new spiculated subsolid nodule in the right upper lobe, new groundglass nodule at the left upper lobe       Disposition: Isaac Phelps is in clinical remission from rectal cancer.  He declines a repeat rectal examination.  Ferritin is low again today.  He requests IV iron.  We will arrange for Feraheme beginning next week.  Isaac Phelps has a history of tobacco use.  There were lung nodules on a chest CT in December.  I reviewed the chest CT images from today with Isaac Phelps.  The dominant nodule in the left lower lung and additional lung nodules appear unchanged.  I suspect these may represent indolent non-small cell lung cancers.  He agrees to a PET scan and referral to pulmonary medicine to consider a diagnostic biopsy.  Isaac Phelps will return for an office visit in 2 months.  Thornton Papas, MD  11/19/2022  10:02 AM

## 2022-11-19 NOTE — Addendum Note (Signed)
Addended by: Wandalee Ferdinand on: 11/19/2022 10:56 AM   Modules accepted: Orders

## 2022-11-19 NOTE — Telephone Encounter (Signed)
Called son-in-law with appointment for PET scan on 6/21. Arrive at 10:00 at [facility] admitting and NPO except unflavored water for 6 hours prior.  Placed referral for Dr. Tonia Brooms to see re: lung nodules.

## 2022-11-23 ENCOUNTER — Encounter (HOSPITAL_COMMUNITY)
Admission: RE | Admit: 2022-11-23 | Discharge: 2022-11-23 | Disposition: A | Discharge status: Home / Self Care | Payer: Medicare Other | Source: Ambulatory Visit | Attending: Oncology | Admitting: Oncology

## 2022-11-23 DIAGNOSIS — R911 Solitary pulmonary nodule: Secondary | ICD-10-CM | POA: Insufficient documentation

## 2022-11-23 LAB — GLUCOSE, CAPILLARY: Glucose-Capillary: 122 mg/dL — ABNORMAL HIGH (ref 70–99)

## 2022-11-23 MED ORDER — FLUDEOXYGLUCOSE F - 18 (FDG) INJECTION
10.0000 | Freq: Once | INTRAVENOUS | Status: AC | PRN
  Administered 2022-11-23: 9.02 via INTRAVENOUS

## 2022-11-26 ENCOUNTER — Inpatient Hospital Stay: Payer: Medicare Other

## 2022-11-27 ENCOUNTER — Telehealth: Payer: Self-pay

## 2022-11-27 NOTE — Telephone Encounter (Signed)
-----   Message from Ladene Artist, MD sent at 11/23/2022  3:59 PM EDT ----- Please call patient, lung nodules are unchanged, part solid nodule in the left lower lobe is stable in size and remain suspicious for a lung tumor, with follow-up with pulmonary medicine as scheduled

## 2022-11-27 NOTE — Telephone Encounter (Signed)
I attempted to contact the patient multiple times with no response. I left a message advising them to return the call to the office. Additionally, I sent out their results via mail and informed them to contact us if they have any questions or concerns regarding their results.

## 2022-11-29 ENCOUNTER — Inpatient Hospital Stay: Payer: Medicare Other

## 2022-12-04 ENCOUNTER — Ambulatory Visit (HOSPITAL_COMMUNITY): Payer: Medicare Other

## 2022-12-04 ENCOUNTER — Telehealth: Payer: Self-pay | Admitting: *Deleted

## 2022-12-04 ENCOUNTER — Encounter: Payer: Medicare Other | Admitting: Family Medicine

## 2022-12-04 ENCOUNTER — Inpatient Hospital Stay: Payer: Medicare Other | Attending: Radiation Oncology

## 2022-12-04 VITALS — BP 190/70 | HR 57 | Temp 98.7°F | Resp 18 | Ht 69.0 in | Wt 179.8 lb

## 2022-12-04 DIAGNOSIS — Z85048 Personal history of other malignant neoplasm of rectum, rectosigmoid junction, and anus: Secondary | ICD-10-CM | POA: Diagnosis present

## 2022-12-04 DIAGNOSIS — D500 Iron deficiency anemia secondary to blood loss (chronic): Secondary | ICD-10-CM

## 2022-12-04 DIAGNOSIS — D509 Iron deficiency anemia, unspecified: Secondary | ICD-10-CM | POA: Insufficient documentation

## 2022-12-04 DIAGNOSIS — E538 Deficiency of other specified B group vitamins: Secondary | ICD-10-CM | POA: Diagnosis present

## 2022-12-04 MED ORDER — DIPHENHYDRAMINE HCL 50 MG/ML IJ SOLN: 50.0000 mg | Freq: Once | INTRAMUSCULAR | Status: DC | PRN

## 2022-12-04 MED ORDER — EPINEPHRINE 0.3 MG/0.3ML IJ SOAJ: 0.3000 mg | Freq: Once | INTRAMUSCULAR | Status: DC | PRN

## 2022-12-04 MED ORDER — SODIUM CHLORIDE 0.9 % IV SOLN: Freq: Once | INTRAVENOUS | Status: AC

## 2022-12-04 MED ORDER — METHYLPREDNISOLONE SODIUM SUCC 125 MG IJ SOLR: 125.0000 mg | Freq: Once | INTRAMUSCULAR | Status: DC | PRN

## 2022-12-04 MED ORDER — FAMOTIDINE IN NACL 20-0.9 MG/50ML-% IV SOLN: 20.0000 mg | Freq: Once | INTRAVENOUS | Status: DC | PRN

## 2022-12-04 MED ORDER — SODIUM CHLORIDE 0.9 % IV SOLN
510.0000 mg | Freq: Once | INTRAVENOUS | Status: AC
  Administered 2022-12-04: 510 mg via INTRAVENOUS
  Filled 2022-12-04: qty 510

## 2022-12-04 MED ORDER — ALBUTEROL SULFATE HFA 108 (90 BASE) MCG/ACT IN AERS: 2.0000 | INHALATION_SPRAY | Freq: Once | RESPIRATORY_TRACT | Status: DC | PRN

## 2022-12-04 MED ORDER — SODIUM CHLORIDE 0.9 % IV SOLN: Freq: Once | INTRAVENOUS | Status: DC | PRN

## 2022-12-04 NOTE — Telephone Encounter (Signed)
F/U call to Dr. Myrlene Broker office re: referral placed. Was informed they have left Mychart message and attempted to leave voice mail (was full). Spoke w/patient today and he reports all his appointments are made by his daughter.  Called daughter and provided her the phone # for Dr. Tonia Brooms and she agrees to call for his appointment.

## 2022-12-04 NOTE — Patient Instructions (Signed)

## 2022-12-10 ENCOUNTER — Inpatient Hospital Stay: Payer: Medicare Other

## 2022-12-10 VITALS — BP 160/70 | HR 64 | Temp 98.1°F | Resp 18 | Ht 69.0 in | Wt 178.5 lb

## 2022-12-10 DIAGNOSIS — D500 Iron deficiency anemia secondary to blood loss (chronic): Secondary | ICD-10-CM

## 2022-12-10 DIAGNOSIS — D509 Iron deficiency anemia, unspecified: Secondary | ICD-10-CM | POA: Diagnosis not present

## 2022-12-10 MED ORDER — SODIUM CHLORIDE 0.9 % IV SOLN: Freq: Once | INTRAVENOUS | Status: AC

## 2022-12-10 MED ORDER — SODIUM CHLORIDE 0.9 % IV SOLN
510.0000 mg | Freq: Once | INTRAVENOUS | Status: AC
  Administered 2022-12-10: 510 mg via INTRAVENOUS
  Filled 2022-12-10: qty 510

## 2022-12-10 NOTE — Patient Instructions (Signed)

## 2022-12-21 NOTE — Addendum Note (Signed)
Encounter addended by: Donato Heinz, RT on: 12/21/2022 1:33 PM  Actions taken: Imaging Exam ended

## 2023-01-20 NOTE — Progress Notes (Unsigned)
Synopsis: Referred in August 2024 for pulmonary nodule by Shirline Frees, NP  Subjective:   PATIENT ID: Isaac Phelps GENDER: male DOB: [birth year], MRN: [id number]  No chief complaint on file.   This is a 89 year old gentleman with a past medical history of rectal adenocarcinoma, history of gastroesophageal reflux, hypertension, hyperlipidemia and tobacco use.  He follows with Dr. Truett Perna for iron deficiency anemia.  As well as a history of rectal cancer in 2022.  Patient had a restaging PET scan this was completed in June 2024.  This PET scan revealed a borderline hypermetabolic groundglass nodule in the superior segment of the left lower lobe concerning for an indolent carcinoma.  Also found to have a 6 mm posterior segment right upper lobe nodule patient was referred for consideration and discussion of next best steps.    ***  Past Medical History:  Diagnosis Date   Adenocarcinoma of rectum (HCC)    Anxiety    BENIGN PROSTATIC HYPERTROPHY 04/01/2008   Coronary artery disease    GERD (gastroesophageal reflux disease)    HYPERLIPIDEMIA 05/26/2007   HYPERTENSION 02/07/2007   Syncope and collapse    TOBACCO ABUSE 04/01/2008     Family History  Problem Relation Age of Onset   Arthritis Neg Hx        family   Hypertension Neg Hx        family   Stroke Neg Hx        family , 1st degree relative   Colon cancer Neg Hx    Colon polyps Neg Hx    Esophageal cancer Neg Hx    Rectal cancer Neg Hx    Stomach cancer Neg Hx      Past Surgical History:  Procedure Laterality Date   APPENDECTOMY     BIOPSY  08/03/2020   Procedure: BIOPSY;  Surgeon: Iva Boop, MD;  Location: [facility] ENDOSCOPY;  Service: Endoscopy;;   COLONOSCOPY WITH PROPOFOL N/A 08/03/2020   Procedure: COLONOSCOPY WITH PROPOFOL;  Surgeon: Iva Boop, MD;  Location: [facility] ENDOSCOPY;  Service: Endoscopy;  Laterality: N/A;   ESOPHAGOGASTRODUODENOSCOPY (EGD) WITH PROPOFOL N/A 08/03/2020   Procedure:  ESOPHAGOGASTRODUODENOSCOPY (EGD) WITH PROPOFOL;  Surgeon: Iva Boop, MD;  Location: [facility] ENDOSCOPY;  Service: Endoscopy;  Laterality: N/A;   TONSILLECTOMY      Social History   Socioeconomic History   Marital status: Widowed    Spouse name: Not on file   Number of children: 1   Years of education: Not on file   Highest education level: Not on file  Occupational History   Not on file  Tobacco Use   Smoking status: Former    Current packs/day: 0.30    Types: Cigarettes   Smokeless tobacco: Never  Vaping Use   Vaping status: Never Used  Substance and Sexual Activity   Alcohol use: No   Drug use: No   Sexual activity: Not on file  Other Topics Concern   Not on file  Social History Narrative   Not on file   Social Determinants of Health   Financial Resource Strain: Low Risk  (11/30/2021)   Overall Financial Resource Strain (CARDIA)    Difficulty of Paying Living Expenses: Not hard at all  Food Insecurity: No Food Insecurity (03/14/2022)   Hunger Vital Sign    Worried About Running Out of Food in the Last Year: Never true    Ran Out of Food in the Last Year: Never true  Transportation Needs: No Transportation Needs (  03/14/2022)   PRAPARE - Administrator, Civil Service (Medical): No    Lack of Transportation (Non-Medical): No  Physical Activity: Inactive (11/30/2021)   Exercise Vital Sign    Days of Exercise per Week: 0 days    Minutes of Exercise per Session: 0 min  Stress: No Stress Concern Present (11/30/2021)   [employer] of Occupational Health - Occupational Stress Questionnaire    Feeling of Stress : Not at all  Social Connections: Socially Isolated (11/30/2021)   Social Connection and Isolation Panel [NHANES]    Frequency of Communication with Friends and Family: More than three times a week    Frequency of Social Gatherings with Friends and Family: More than three times a week    Attends Religious Services: Never    Database administrator or  Organizations: No    Attends Banker Meetings: Never    Marital Status: Widowed  Intimate Partner Violence: Not At Risk (11/30/2021)   Humiliation, Afraid, Rape, and Kick questionnaire    Fear of Current or Ex-Partner: No    Emotionally Abused: No    Physically Abused: No    Sexually Abused: No     No Known Allergies   Outpatient Medications Prior to Visit  Medication Sig Dispense Refill   acetaminophen (TYLENOL) 650 MG CR tablet Take 650 mg by mouth daily as needed for pain.     amLODipine (NORVASC) 10 MG tablet Take 1 tablet (10 mg total) by mouth daily. 90 tablet 0   carvedilol (COREG) 3.125 MG tablet Take 1 tablet (3.125 mg total) by mouth 2 (two) times daily with a meal. 180 tablet 0   Cyanocobalamin (VITAMIN B12 PO) Take 1 tablet by mouth daily.     docusate sodium (COLACE) 100 MG capsule Take 100 mg by mouth 2 (two) times daily as needed for mild constipation.     ferrous sulfate 325 (65 FE) MG EC tablet Take 325 mg by mouth 2 (two) times daily with a meal. (Patient not taking: Reported on 12/10/2022)     hydrALAZINE (APRESOLINE) 100 MG tablet Take 1 tablet (100 mg total) by mouth every 8 (eight) hours. 270 tablet 1   polyvinyl alcohol (LIQUIFILM TEARS) 1.4 % ophthalmic solution Place 1 drop into both eyes daily as needed for dry eyes.     simvastatin (ZOCOR) 40 MG tablet TAKE 1 TABLET(40 MG) BY MOUTH DAILY 90 tablet 1   Facility-Administered Medications Prior to Visit  Medication Dose Route Frequency Provider Last Rate Last Admin   0.9 %  sodium chloride infusion  500 mL Intravenous Once Iva Boop, MD        ROS   Objective:  Physical Exam   There were no vitals filed for this visit.   on *** LPM *** RA BMI Readings from Last 3 Encounters:  12/10/22 26.36 kg/m  12/04/22 26.55 kg/m  11/19/22 27.02 kg/m   Wt Readings from Last 3 Encounters:  12/10/22 178 lb 8 oz (81 kg)  12/04/22 179 lb 12.8 oz (81.6 kg)  11/19/22 183 lb (83 kg)     CBC     Component Value Date/Time   WBC 6.7 11/19/2022 0756   WBC 6.2 11/01/2021 1134   RBC 3.63 (L) 11/19/2022 0756   HGB 9.5 (L) 11/19/2022 0756   HCT 30.3 (L) 11/19/2022 0756   PLT 227 11/19/2022 0756   MCV 83.5 11/19/2022 0756   MCH 26.2 11/19/2022 0756   MCHC 31.4 11/19/2022 0756  RDW 15.0 11/19/2022 0756   LYMPHSABS 0.4 (L) 11/19/2022 0756   MONOABS 0.7 11/19/2022 0756   EOSABS 0.2 11/19/2022 0756   BASOSABS 0.0 11/19/2022 0756      Chest Imaging:  Nuclear medicine pet imaging June 2024: 2 pulmonary nodules, largest in the left lower lobe superior segment, subsolid groundglass lesion concerning for indolent carcinoma. The patient's images have been independently reviewed by me.    Pulmonary Functions Testing Results:     No data to display          FeNO:   Pathology:   Echocardiogram:   Heart Catheterization:     Assessment & Plan:     ICD-10-CM   1. Pulmonary nodule  R91.1     2. Advanced age  R85     3. History of rectal cancer  Z85.048       Discussion: ***   Current Outpatient Medications:    acetaminophen (TYLENOL) 650 MG CR tablet, Take 650 mg by mouth daily as needed for pain., Disp: , Rfl:    amLODipine (NORVASC) 10 MG tablet, Take 1 tablet (10 mg total) by mouth daily., Disp: 90 tablet, Rfl: 0   carvedilol (COREG) 3.125 MG tablet, Take 1 tablet (3.125 mg total) by mouth 2 (two) times daily with a meal., Disp: 180 tablet, Rfl: 0   Cyanocobalamin (VITAMIN B12 PO), Take 1 tablet by mouth daily., Disp: , Rfl:    docusate sodium (COLACE) 100 MG capsule, Take 100 mg by mouth 2 (two) times daily as needed for mild constipation., Disp: , Rfl:    ferrous sulfate 325 (65 FE) MG EC tablet, Take 325 mg by mouth 2 (two) times daily with a meal. (Patient not taking: Reported on 12/10/2022), Disp: , Rfl:    hydrALAZINE (APRESOLINE) 100 MG tablet, Take 1 tablet (100 mg total) by mouth every 8 (eight) hours., Disp: 270 tablet, Rfl: 1   polyvinyl alcohol  (LIQUIFILM TEARS) 1.4 % ophthalmic solution, Place 1 drop into both eyes daily as needed for dry eyes., Disp: , Rfl:    simvastatin (ZOCOR) 40 MG tablet, TAKE 1 TABLET(40 MG) BY MOUTH DAILY, Disp: 90 tablet, Rfl: 1  Current Facility-Administered Medications:    0.9 %  sodium chloride infusion, 500 mL, Intravenous, Once, Leone Payor, Maryjean Morn, MD  I spent *** minutes dedicated to the care of this patient on the date of this encounter to include pre-visit review of records, face-to-face time with the patient discussing conditions above, post visit ordering of testing, clinical documentation with the electronic health record, making appropriate referrals as documented, and communicating necessary findings to members of the patients care team.   Josephine Igo, DO [facility] 01/20/2023 12:19 PM

## 2023-01-21 ENCOUNTER — Ambulatory Visit (INDEPENDENT_AMBULATORY_CARE_PROVIDER_SITE_OTHER): Payer: Medicare Other | Admitting: Pulmonary Disease

## 2023-01-21 ENCOUNTER — Encounter: Payer: Self-pay | Admitting: Pulmonary Disease

## 2023-01-21 VITALS — BP 160/90 | HR 63 | Ht 69.0 in | Wt 183.4 lb

## 2023-01-21 DIAGNOSIS — R911 Solitary pulmonary nodule: Secondary | ICD-10-CM

## 2023-01-21 DIAGNOSIS — R54 Age-related physical debility: Secondary | ICD-10-CM

## 2023-01-21 DIAGNOSIS — Z85048 Personal history of other malignant neoplasm of rectum, rectosigmoid junction, and anus: Secondary | ICD-10-CM | POA: Diagnosis not present

## 2023-01-21 NOTE — Patient Instructions (Signed)
Thank you for visiting Dr. Tonia Brooms at [facility]. Today we recommend the following:  No additional follow up needed   Return if symptoms worsen or fail to improve.    Please do your part to reduce the spread of COVID-19.

## 2023-01-24 ENCOUNTER — Inpatient Hospital Stay: Payer: Medicare Other | Admitting: Oncology

## 2023-01-24 ENCOUNTER — Inpatient Hospital Stay: Payer: Medicare Other

## 2023-02-28 ENCOUNTER — Inpatient Hospital Stay: Payer: Medicare Other | Admitting: Oncology

## 2023-02-28 ENCOUNTER — Inpatient Hospital Stay: Payer: Medicare Other

## 2023-03-11 ENCOUNTER — Inpatient Hospital Stay (HOSPITAL_BASED_OUTPATIENT_CLINIC_OR_DEPARTMENT_OTHER): Payer: Medicare Other | Admitting: Oncology

## 2023-03-11 ENCOUNTER — Inpatient Hospital Stay: Payer: Medicare Other | Attending: Radiation Oncology

## 2023-03-11 VITALS — BP 164/72 | HR 62 | Temp 98.1°F | Resp 18 | Ht 69.0 in | Wt 182.1 lb

## 2023-03-11 DIAGNOSIS — D500 Iron deficiency anemia secondary to blood loss (chronic): Secondary | ICD-10-CM | POA: Diagnosis not present

## 2023-03-11 DIAGNOSIS — D509 Iron deficiency anemia, unspecified: Secondary | ICD-10-CM | POA: Diagnosis present

## 2023-03-11 DIAGNOSIS — Z85038 Personal history of other malignant neoplasm of large intestine: Secondary | ICD-10-CM | POA: Insufficient documentation

## 2023-03-11 DIAGNOSIS — N400 Enlarged prostate without lower urinary tract symptoms: Secondary | ICD-10-CM | POA: Insufficient documentation

## 2023-03-11 DIAGNOSIS — E538 Deficiency of other specified B group vitamins: Secondary | ICD-10-CM | POA: Insufficient documentation

## 2023-03-11 DIAGNOSIS — Z85048 Personal history of other malignant neoplasm of rectum, rectosigmoid junction, and anus: Secondary | ICD-10-CM | POA: Diagnosis present

## 2023-03-11 LAB — CBC WITH DIFFERENTIAL (CANCER CENTER ONLY)
Abs Immature Granulocytes: 0.02 10*3/uL (ref 0.00–0.07)
Basophils Absolute: 0 10*3/uL (ref 0.0–0.1)
Basophils Relative: 1 %
Eosinophils Absolute: 0.1 10*3/uL (ref 0.0–0.5)
Eosinophils Relative: 2 %
HCT: 34.1 % — ABNORMAL LOW (ref 39.0–52.0)
Hemoglobin: 10.9 g/dL — ABNORMAL LOW (ref 13.0–17.0)
Immature Granulocytes: 0 %
Lymphocytes Relative: 10 %
Lymphs Abs: 0.6 10*3/uL — ABNORMAL LOW (ref 0.7–4.0)
MCH: 26.8 pg (ref 26.0–34.0)
MCHC: 32 g/dL (ref 30.0–36.0)
MCV: 83.8 fL (ref 80.0–100.0)
Monocytes Absolute: 0.5 10*3/uL (ref 0.1–1.0)
Monocytes Relative: 8 %
Neutro Abs: 4.8 10*3/uL (ref 1.7–7.7)
Neutrophils Relative %: 79 %
Platelet Count: 239 10*3/uL (ref 150–400)
RBC: 4.07 MIL/uL — ABNORMAL LOW (ref 4.22–5.81)
RDW: 15.8 % — ABNORMAL HIGH (ref 11.5–15.5)
WBC Count: 6.1 10*3/uL (ref 4.0–10.5)
nRBC: 0 % (ref 0.0–0.2)

## 2023-03-11 LAB — BASIC METABOLIC PANEL - CANCER CENTER ONLY
Anion gap: 8 (ref 5–15)
BUN: 37 mg/dL — ABNORMAL HIGH (ref 8–23)
CO2: 24 mmol/L (ref 22–32)
Calcium: 9.4 mg/dL (ref 8.9–10.3)
Chloride: 106 mmol/L (ref 98–111)
Creatinine: 2.12 mg/dL — ABNORMAL HIGH (ref 0.61–1.24)
GFR, Estimated: 29 mL/min — ABNORMAL LOW (ref >60)
Glucose, Bld: 102 mg/dL — ABNORMAL HIGH (ref 70–99)
Potassium: 4.7 mmol/L (ref 3.5–5.1)
Sodium: 138 mmol/L (ref 135–145)

## 2023-03-11 LAB — FERRITIN: Ferritin: 58 ng/mL (ref 24–336)

## 2023-03-11 NOTE — Progress Notes (Signed)
[facility] OFFICE PROGRESS NOTE   Diagnosis: Rectal cancer, anemia  INTERVAL HISTORY:   [name] returns as scheduled.  He feels well.  No rectal bleeding or pain.  He has intermittent discomfort at the right inguinal hernias.  No consistent pain.  He saw Dr. Tonia Brooms for evaluation of the lung nodule.  Dr. Tonia Brooms feels the nodule likely represents an indolent lung cancer and recommends observation.  Objective:  Vital signs in last 24 hours:  Blood pressure (!) 164/72, pulse 62, temperature 98.1 F (36.7 C), temperature source Temporal, resp. rate 18, height 5\' 9"  (1.753 m), weight 182 lb 1.6 oz (82.6 kg), SpO2 98%.     Lymphatics: No cervical, supraclavicular, axillary, or inguinal nodes Resp: Lungs clear bilaterally Cardio: Regular rate and rhythm GI: No hepatosplenomegaly, reducible bilateral inguinal hernias Vascular: No leg edema, trace ankle edema bilaterally    Lab Results:  Lab Results  Component Value Date   WBC 6.1 03/11/2023   HGB 10.9 (L) 03/11/2023   HCT 34.1 (L) 03/11/2023   MCV 83.8 03/11/2023   PLT 239 03/11/2023   NEUTROABS 4.8 03/11/2023    CMP  Lab Results  Component Value Date   NA 138 03/11/2023   K 4.7 03/11/2023   CL 106 03/11/2023   CO2 24 03/11/2023   GLUCOSE 102 (H) 03/11/2023   BUN 37 (H) 03/11/2023   CREATININE 2.12 (H) 03/11/2023   CALCIUM 9.4 03/11/2023   PROT 8.0 11/01/2021   ALBUMIN 4.1 11/01/2021   AST 28 11/01/2021   ALT 11 11/01/2021   ALKPHOS 107 11/01/2021   BILITOT 0.4 11/01/2021   GFRNONAA 29 (L) 03/11/2023   GFRAA 42 (L) 02/14/2015    Lab Results  Component Value Date   CEA1 3.59 02/28/2021   CEA 5.53 (H) 05/14/2022     Medications: I have reviewed the patient's current medications.   Assessment/Plan: Rectal cancer Colonoscopy 08/03/2020-distal rectal mass abutting the anus, biopsy confirmed adenocarcinoma CT angiography 07/29/2020-hazy soft tissue at the porta hepatis encasing the right  hepatic artery, exophytic soft tissue lesion at the upper pole of the left kidney-indeterminate, partially solid nodule in the left lower lobe-present on a CT in 2013 suspicious, suspicious for a low-grade pulmonary neoplasm, enlarged prostate, MRI abdomen 07/30/2020-no evidence of mass in the porta hepatis, hypoenhancing lesions in the upper pole of the left kidney and interpolar region of the right kidney suspicious for small neoplasms, hemosiderosis of the liver and spleen MRI pelvis 08/17/2020-T2, N0; minimal if any distance from tumor to internal anal sphincter Radiation and capecitabine 09/12/2020-10/21/2020 Sigmoidoscopy 12/08/2020-normal rectum and sigmoid, no tumor seen, slight mucosal edema and loss of vascular pattern in the distal rectum, biopsy-benign colonic mucosa, no malignancy Enlarged prostate with an elevated PSA CVAs confirmed on brain MRI 07/31/2020-maintained on aspirin Hypertension Gout History of coronary artery disease Iron deficiency anemia-Feraheme 07/30/2020 Ferrous sulfate 11/09/2021 Feraheme 05/22/2022, 05/31/2022 B12 deficiency-07/31/2020 B12 161, vitamin B12 therapy resumed 12/19/2020 Left lower lobe nodule on CT 07/29/2020 Chest CT 05/31/2022-subsolid superior segment left lower lobe nodule increased slightly in size, new spiculated subsolid nodule in the right upper lobe, new groundglass nodule at the left upper lobe CT chest 11/19/2022-stable superior segment left lower lobe nodule and stable solid spiculated nodule in the right upper lobe 11/23/2022-PET-borderline hypermetabolic groundglass nodule in superior segment left lower lobe-stable, concerning for indolent adenocarcinoma, 6 mm posterior segment right upper lobe nodule 10.  Renal insufficiency  Disposition: [name] appears stable.  There is no clinical evidence  for progression of rectal cancer.  He declined a rectal exam today.  He has a history of iron deficiency anemia.  He is not taking iron.  The hemoglobin  is improved today.  We will follow-up on the ferritin level.  He will call for symptoms of anemia.  [name] will call if the inguinal hernias become more painful and we will make a surgical referral.  He has decided instead biopsy and radiation for management of the left lower lobe nodule.  [name] will return for an office and lab visit in 3 months.  Thornton Papas, MD  03/11/2023  10:57 AM

## 2023-06-10 ENCOUNTER — Telehealth: Payer: Self-pay

## 2023-06-10 NOTE — Telephone Encounter (Signed)
 per patients daughter, cancel appt on 06/11/2023 for labs and office visit with Dr. Truett Perna. Patient's daughter did not wish to reschedule at this time

## 2023-06-11 ENCOUNTER — Inpatient Hospital Stay: Payer: Medicare Other | Admitting: Oncology

## 2023-06-11 ENCOUNTER — Other Ambulatory Visit: Payer: Medicare Other

## 2023-06-11 ENCOUNTER — Inpatient Hospital Stay: Payer: Medicare Other

## 2023-07-15 ENCOUNTER — Ambulatory Visit (HOSPITAL_COMMUNITY): Payer: Medicare Other

## 2023-07-16 ENCOUNTER — Ambulatory Visit (HOSPITAL_COMMUNITY)
Admission: RE | Admit: 2023-07-16 | Discharge: 2023-07-16 | Disposition: A | Discharge status: Home / Self Care | Payer: Medicare Other | Source: Ambulatory Visit | Attending: Family Medicine | Admitting: Family Medicine

## 2023-07-16 ENCOUNTER — Encounter (HOSPITAL_BASED_OUTPATIENT_CLINIC_OR_DEPARTMENT_OTHER): Payer: Self-pay | Admitting: Emergency Medicine

## 2023-07-16 ENCOUNTER — Emergency Department (HOSPITAL_BASED_OUTPATIENT_CLINIC_OR_DEPARTMENT_OTHER): Payer: No Typology Code available for payment source | Admitting: Radiology

## 2023-07-16 ENCOUNTER — Inpatient Hospital Stay (HOSPITAL_BASED_OUTPATIENT_CLINIC_OR_DEPARTMENT_OTHER)
Admission: EM | Admit: 2023-07-16 | Discharge: 2023-07-19 | DRG: 305 | Disposition: A | Discharge status: Home Health | Payer: No Typology Code available for payment source | Attending: Internal Medicine | Admitting: Internal Medicine

## 2023-07-16 ENCOUNTER — Encounter: Payer: Self-pay | Admitting: Oncology

## 2023-07-16 ENCOUNTER — Encounter (HOSPITAL_COMMUNITY): Payer: Self-pay

## 2023-07-16 ENCOUNTER — Other Ambulatory Visit: Payer: Self-pay

## 2023-07-16 VITALS — BP 240/100 | HR 78 | Temp 98.1°F | Resp 18

## 2023-07-16 DIAGNOSIS — Z79899 Other long term (current) drug therapy: Secondary | ICD-10-CM

## 2023-07-16 DIAGNOSIS — R609 Edema, unspecified: Secondary | ICD-10-CM

## 2023-07-16 DIAGNOSIS — Z91199 Patient's noncompliance with other medical treatment and regimen due to unspecified reason: Secondary | ICD-10-CM

## 2023-07-16 DIAGNOSIS — I169 Hypertensive crisis, unspecified: Secondary | ICD-10-CM | POA: Diagnosis not present

## 2023-07-16 DIAGNOSIS — I2510 Atherosclerotic heart disease of native coronary artery without angina pectoris: Secondary | ICD-10-CM | POA: Diagnosis present

## 2023-07-16 DIAGNOSIS — N183 Chronic kidney disease, stage 3 unspecified: Secondary | ICD-10-CM | POA: Diagnosis present

## 2023-07-16 DIAGNOSIS — N184 Chronic kidney disease, stage 4 (severe): Secondary | ICD-10-CM | POA: Diagnosis present

## 2023-07-16 DIAGNOSIS — Z9049 Acquired absence of other specified parts of digestive tract: Secondary | ICD-10-CM

## 2023-07-16 DIAGNOSIS — Z7409 Other reduced mobility: Secondary | ICD-10-CM

## 2023-07-16 DIAGNOSIS — N400 Enlarged prostate without lower urinary tract symptoms: Secondary | ICD-10-CM | POA: Diagnosis present

## 2023-07-16 DIAGNOSIS — I10 Essential (primary) hypertension: Secondary | ICD-10-CM | POA: Diagnosis present

## 2023-07-16 DIAGNOSIS — I5032 Chronic diastolic (congestive) heart failure: Secondary | ICD-10-CM

## 2023-07-16 DIAGNOSIS — I130 Hypertensive heart and chronic kidney disease with heart failure and stage 1 through stage 4 chronic kidney disease, or unspecified chronic kidney disease: Secondary | ICD-10-CM | POA: Diagnosis present

## 2023-07-16 DIAGNOSIS — I160 Hypertensive urgency: Principal | ICD-10-CM | POA: Diagnosis present

## 2023-07-16 DIAGNOSIS — Z85048 Personal history of other malignant neoplasm of rectum, rectosigmoid junction, and anus: Secondary | ICD-10-CM

## 2023-07-16 DIAGNOSIS — Z91148 Patient's other noncompliance with medication regimen for other reason: Secondary | ICD-10-CM

## 2023-07-16 DIAGNOSIS — R7989 Other specified abnormal findings of blood chemistry: Secondary | ICD-10-CM

## 2023-07-16 DIAGNOSIS — Z91128 Patient's intentional underdosing of medication regimen for other reason: Secondary | ICD-10-CM

## 2023-07-16 DIAGNOSIS — F17210 Nicotine dependence, cigarettes, uncomplicated: Secondary | ICD-10-CM | POA: Diagnosis present

## 2023-07-16 DIAGNOSIS — E785 Hyperlipidemia, unspecified: Secondary | ICD-10-CM | POA: Diagnosis present

## 2023-07-16 DIAGNOSIS — M1711 Unilateral primary osteoarthritis, right knee: Secondary | ICD-10-CM | POA: Diagnosis present

## 2023-07-16 LAB — TROPONIN I (HIGH SENSITIVITY)
Troponin I (High Sensitivity): 48 ng/L — ABNORMAL HIGH (ref <18)
Troponin I (High Sensitivity): 64 ng/L — ABNORMAL HIGH (ref <18)
Troponin I (High Sensitivity): 76 ng/L — ABNORMAL HIGH (ref <18)

## 2023-07-16 LAB — CBC
HCT: 37.9 % — ABNORMAL LOW (ref 39.0–52.0)
Hemoglobin: 11.8 g/dL — ABNORMAL LOW (ref 13.0–17.0)
MCH: 26.8 pg (ref 26.0–34.0)
MCHC: 31.1 g/dL (ref 30.0–36.0)
MCV: 85.9 fL (ref 80.0–100.0)
Platelets: 242 10*3/uL (ref 150–400)
RBC: 4.41 MIL/uL (ref 4.22–5.81)
RDW: 15.4 % (ref 11.5–15.5)
WBC: 5.6 10*3/uL (ref 4.0–10.5)
nRBC: 0 % (ref 0.0–0.2)

## 2023-07-16 LAB — BASIC METABOLIC PANEL
Anion gap: 9 (ref 5–15)
BUN: 30 mg/dL — ABNORMAL HIGH (ref 8–23)
CO2: 25 mmol/L (ref 22–32)
Calcium: 9.6 mg/dL (ref 8.9–10.3)
Chloride: 105 mmol/L (ref 98–111)
Creatinine, Ser: 1.99 mg/dL — ABNORMAL HIGH (ref 0.61–1.24)
GFR, Estimated: 31 mL/min — ABNORMAL LOW (ref >60)
Glucose, Bld: 94 mg/dL (ref 70–99)
Potassium: 3.8 mmol/L (ref 3.5–5.1)
Sodium: 139 mmol/L (ref 135–145)

## 2023-07-16 LAB — BRAIN NATRIURETIC PEPTIDE: B Natriuretic Peptide: 113.2 pg/mL — ABNORMAL HIGH (ref 0.0–100.0)

## 2023-07-16 MED ORDER — HYDRALAZINE HCL 20 MG/ML IJ SOLN
5.0000 mg | Freq: Once | INTRAMUSCULAR | Status: AC
  Administered 2023-07-16: 5 mg via INTRAVENOUS
  Filled 2023-07-16: qty 1

## 2023-07-16 MED ORDER — AMLODIPINE BESYLATE 5 MG PO TABS
10.0000 mg | ORAL_TABLET | Freq: Once | ORAL | Status: AC
  Administered 2023-07-16: 10 mg via ORAL
  Filled 2023-07-16: qty 2

## 2023-07-16 MED ORDER — CARVEDILOL 6.25 MG PO TABS: 3.1250 mg | ORAL_TABLET | Freq: Once | ORAL | Status: DC

## 2023-07-16 MED ORDER — FUROSEMIDE 10 MG/ML IJ SOLN
40.0000 mg | Freq: Once | INTRAMUSCULAR | Status: AC
  Administered 2023-07-16: 40 mg via INTRAVENOUS
  Filled 2023-07-16: qty 4

## 2023-07-16 MED ORDER — AMLODIPINE BESYLATE 5 MG PO TABS
5.0000 mg | ORAL_TABLET | Freq: Once | ORAL | Status: AC
  Administered 2023-07-16: 5 mg via ORAL
  Filled 2023-07-16: qty 1

## 2023-07-16 NOTE — ED Triage Notes (Signed)
Pt c/o of bilateral feet and ankle swelling that has been intermittent for month but has recently increased.  He says he feels shaky and nervous. In addition, he often feels dizzy and weak.   Interventions: None

## 2023-07-16 NOTE — ED Provider Notes (Signed)
[facility]    CSN: [id number] Arrival date & time: 07/16/23  0809      History   Chief Complaint Chief Complaint  Patient presents with   Joint Swelling   Dizziness    HPI Isaac Phelps is a 89 y.o. male.    Dizziness Associated symptoms: headaches and shortness of breath    He is here with ankle and feet swelling.  This has been going on for years, off/on, but most recently it has been worse, not resolving.  He feels stiffness up the legs to the knees as well.  He has not follow up with a primary care provider for a very long time.  He stopped taking all his medications, reports that he is not on medications at all at this time.  He does c/o dizziness at times, some headaches at times.   No chest pains, but c/o occasional sob.        Past Medical History:  Diagnosis Date   Adenocarcinoma of rectum (HCC)    Anxiety    BENIGN PROSTATIC HYPERTROPHY 04/01/2008   Coronary artery disease    GERD (gastroesophageal reflux disease)    HYPERLIPIDEMIA 05/26/2007   HYPERTENSION 02/07/2007   Syncope and collapse    TOBACCO ABUSE 04/01/2008    Patient Active Problem List   Diagnosis Date Noted   Adenocarcinoma of rectum (HCC)    Cerebral embolism with cerebral infarction 08/01/2020   Iron deficiency anemia    HTN (hypertension), malignant 07/29/2020   Hypertensive urgency    AKI (acute kidney injury) (HCC)    Anemia of chronic disease 05/07/2017   Gouty arthritis 03/21/2016   CKD (chronic kidney disease) stage 3, GFR 30-59 ml/min (HCC) 02/09/2015   TOBACCO ABUSE 04/01/2008   BPH (benign prostatic hyperplasia) 04/01/2008   Dyslipidemia 05/26/2007   Essential hypertension 02/07/2007    Past Surgical History:  Procedure Laterality Date   APPENDECTOMY     BIOPSY  08/03/2020   Procedure: BIOPSY;  Surgeon: Iva Boop, MD;  Location: [facility] ENDOSCOPY;  Service: Endoscopy;;   COLONOSCOPY WITH PROPOFOL N/A 08/03/2020   Procedure: COLONOSCOPY WITH PROPOFOL;   Surgeon: Iva Boop, MD;  Location: [facility] ENDOSCOPY;  Service: Endoscopy;  Laterality: N/A;   ESOPHAGOGASTRODUODENOSCOPY (EGD) WITH PROPOFOL N/A 08/03/2020   Procedure: ESOPHAGOGASTRODUODENOSCOPY (EGD) WITH PROPOFOL;  Surgeon: Iva Boop, MD;  Location: [facility] ENDOSCOPY;  Service: Endoscopy;  Laterality: N/A;   TONSILLECTOMY         Home Medications    Prior to Admission medications   Medication Sig Start Date End Date Taking? Authorizing Provider  acetaminophen (TYLENOL) 650 MG CR tablet Take 650 mg by mouth daily as needed for pain.   Yes [provider]  amLODipine (NORVASC) 10 MG tablet Take 1 tablet (10 mg total) by mouth daily. 08/16/22 03/11/23  Nafziger, Kandee Keen, NP  carvedilol (COREG) 3.125 MG tablet Take 1 tablet (3.125 mg total) by mouth 2 (two) times daily with a meal. 08/16/22 03/11/23  Nafziger, Kandee Keen, NP  Cyanocobalamin (VITAMIN B12 PO) Take 1 tablet by mouth daily.   Yes [provider]  docusate sodium (COLACE) 100 MG capsule Take 100 mg by mouth 2 (two) times daily as needed for mild constipation. 11/09/21  Yes Ladene Artist, MD  ferrous sulfate 325 (65 FE) MG EC tablet Take 325 mg by mouth 2 (two) times daily with a meal. 11/09/21  Yes Ladene Artist, MD  hydrALAZINE (APRESOLINE) 100 MG tablet Take 1 tablet (100  mg total) by mouth every 8 (eight) hours. 08/16/22 03/11/23  Nafziger, Kandee Keen, NP  polyvinyl alcohol (LIQUIFILM TEARS) 1.4 % ophthalmic solution Place 1 drop into both eyes daily as needed for dry eyes.   Yes [provider]  simvastatin (ZOCOR) 40 MG tablet TAKE 1 TABLET(40 MG) BY MOUTH DAILY 04/19/21  Yes Nafziger, Kandee Keen, NP    Family History Family History  Problem Relation Age of Onset   Arthritis Neg Hx        family   Hypertension Neg Hx        family   Stroke Neg Hx        family , 1st degree relative   Colon cancer Neg Hx    Colon polyps Neg Hx    Esophageal cancer Neg Hx    Rectal cancer Neg Hx    Stomach cancer Neg Hx      Social History Social History   Tobacco Use   Smoking status: Every Day    Current packs/day: 0.30    Types: Cigarettes   Smokeless tobacco: Never  Vaping Use   Vaping status: Never Used  Substance Use Topics   Alcohol use: No   Drug use: No     Allergies   Patient has no known allergies.   Review of Systems Review of Systems  Constitutional: Negative.   HENT: Negative.    Respiratory:  Positive for shortness of breath.   Cardiovascular:  Positive for leg swelling.  Gastrointestinal: Negative.   Musculoskeletal: Negative.   Neurological:  Positive for dizziness and headaches.  Psychiatric/Behavioral: Negative.       Physical Exam Triage Vital Signs ED Triage Vitals  Encounter Vitals Group     BP 07/16/23 0852 (!) 241/118     Systolic BP Percentile --      Diastolic BP Percentile --      Pulse Rate 07/16/23 0852 78     Resp 07/16/23 0852 18     Temp 07/16/23 0852 98.1 F (36.7 C)     Temp Source 07/16/23 0852 Oral     SpO2 07/16/23 0852 99 %     Weight --      Height --      Head Circumference --      Peak Flow --      Pain Score 07/16/23 0847 0     Pain Loc --      Pain Education --      Exclude from Growth Chart --    No data found.  Updated Vital Signs BP (!) 240/100 (BP Location: Right Arm)   Pulse 78   Temp 98.1 F (36.7 C) (Oral)   Resp 18   SpO2 99%   Visual Acuity Right Eye Distance:   Left Eye Distance:   Bilateral Distance:    Right Eye Near:   Left Eye Near:    Bilateral Near:     Physical Exam Constitutional:      General: He is not in acute distress.    Appearance: Normal appearance. He is not ill-appearing or toxic-appearing.  Cardiovascular:     Rate and Rhythm: Normal rate and regular rhythm.  Pulmonary:     Effort: Pulmonary effort is normal.     Breath sounds: Normal breath sounds.  Musculoskeletal:     Cervical back: Normal range of motion and neck supple.     Right lower leg: 1+ Pitting Edema present.      Left lower leg: 1+ Pitting Edema present.  Skin:    General: Skin is warm.  Neurological:     General: No focal deficit present.     Mental Status: He is alert.  Psychiatric:        Mood and Affect: Mood normal.      UC Treatments / Results  Labs (all labs ordered are listed, but only abnormal results are displayed) Labs Reviewed - No data to display  EKG   Radiology No results found.  Procedures Procedures (including critical care time)  Medications Ordered in UC Medications - No data to display  Initial Impression / Assessment and Plan / UC Course  I have reviewed the triage vital signs and the nursing notes.  Pertinent labs & imaging results that were available during my care of the patient were reviewed by me and considered in my medical decision making (see chart for details).   Final Clinical Impressions(s) / UC Diagnoses   Final diagnoses:  Malignant hypertension  Edema, unspecified type     Discharge Instructions      He was seen today for swelling of feet/ankles, but his blood pressure if very elevated.  Please go to the ER for further evaluation.     ED Prescriptions   None    PDMP not reviewed this encounter.   Jannifer Franklin, MD 07/16/23 [phone]

## 2023-07-16 NOTE — ED Provider Notes (Addendum)
Patient here with elevated troponins first 1 was 40 8 repeat was 64.  No chest pain.  Patient's blood pressure also elevated.  Patient has not been taking his medications.   Patient has agreed to admission.  Will contact hospitalist for admission for trending of the troponins and for blood pressure control.  Patient's blood pressure little better controlled.  Treated here with Norvasc and hydralazine.  Now 160.   Vanetta Mulders, MD 07/16/23 1830    Vanetta Mulders, MD 07/16/23 1832   Patient's systolic pressures are around 161.  I am not going to give any more hypertensive meds.  Because he started out with systolics of 240.  180 would be probably appropriate for him.  Patient still mentating fine.  Patient's last troponin was at 130.  Will order a third troponin.  Hospitalist will admit him.   Vanetta Mulders, MD 07/16/23 2016

## 2023-07-16 NOTE — Progress Notes (Signed)
Plan of Care Note for accepted transfer   Patient: Isaac Phelps MRN: [id number]   DOA: 07/16/2023  Facility requesting transfer: [facility]   Requesting Provider: Dr. Deretha Emory   Reason for transfer: Hypertensive crisis   Facility course: 89 yr old man with HTN, CAD, CVA, CKD 4, and rectal cancer who presents with increased leg swelling, dizziness, weakness, and shortness of breath.   He initially presented to urgent care where he was noted to have systolic blood pressure of 240.  Family notes that he is noncompliant with his medications and has not taken them in roughly 2 weeks.  In the ED, creatinine appears stable and troponin was 48 and then 64.  He was treated with Norvasc, IV Lasix, and multiple doses of IV hydralazine.    Plan of care: The patient is accepted for admission to Progressive unit, at [facility].   Author: Briscoe Deutscher, MD 07/16/2023  Check [email or web address] for on-call coverage.  Nursing staff, Please call [facility] Admits & Consults System-Wide number on Amion as soon as patient's arrival, so appropriate admitting provider can evaluate the pt.

## 2023-07-16 NOTE — ED Triage Notes (Signed)
C/o central chest pain starting today. Seen at [facility] for bilateral lower leg swelling x 1 year. Patient states he is nervous and feels "worked up"

## 2023-07-16 NOTE — ED Provider Notes (Signed)
[place] EMERGENCY DEPARTMENT AT [facility] Provider Note  CSN: [id number] Arrival date & time: 07/16/23 0865  Chief Complaint(s) Leg Swelling and Chest Pain  HPI Isaac Phelps is a 89 y.o. male with past medical history as below, significant for tobacco abuse, hypertension, IDA, CKD, CAD, anxiety who presents to the ED with complaint of low blood pressure, leg swelling, knee pain  Patient reports he stopped taking all his medications for "some time now."  Reports he stopped taking them because he did not think it was helping and he did not want to see his doctor for refill.  He has been having ongoing swelling to his lower extremities over the past couple years, worsening in the past week or so.  No significant dyspnea, does not check his weights regularly.  Reports that he was on diuretic in the past but stopped taking.  Patient reports that his right knee is aching, has been having some discomfort here for years associate with his arthritis.  No recent falls or injuries, no fevers.  He reports that he had a brief moment of chest pain earlier today while he was getting into a vehicle, sharp stabbing pain lasted for 1 to 2 seconds and resolve spontaneously.   He was seen in urgent care earlier today and sent to the ED for evaluation due to high blood pressure  Past Medical History Past Medical History:  Diagnosis Date   Adenocarcinoma of rectum (HCC)    Anxiety    BENIGN PROSTATIC HYPERTROPHY 04/01/2008   Coronary artery disease    GERD (gastroesophageal reflux disease)    HYPERLIPIDEMIA 05/26/2007   HYPERTENSION 02/07/2007   Syncope and collapse    TOBACCO ABUSE 04/01/2008   Patient Active Problem List   Diagnosis Date Noted   Hypertensive crisis 07/16/2023   Adenocarcinoma of rectum (HCC)    Cerebral embolism with cerebral infarction 08/01/2020   Iron deficiency anemia    HTN (hypertension), malignant 07/29/2020   Hypertensive urgency    AKI (acute kidney injury)  (HCC)    Anemia of chronic disease 05/07/2017   Gouty arthritis 03/21/2016   CKD (chronic kidney disease) stage 3, GFR 30-59 ml/min (HCC) 02/09/2015   TOBACCO ABUSE 04/01/2008   BPH (benign prostatic hyperplasia) 04/01/2008   Dyslipidemia 05/26/2007   Essential hypertension 02/07/2007   Home Medication(s) Prior to Admission medications   Medication Sig Start Date End Date Taking? Authorizing Provider  acetaminophen (TYLENOL) 650 MG CR tablet Take 650 mg by mouth daily as needed for pain.   Yes [provider]  amLODipine (NORVASC) 10 MG tablet Take 1 tablet (10 mg total) by mouth daily. 08/16/22 07/16/23 Yes Nafziger, Kandee Keen, NP  ferrous sulfate 325 (65 FE) MG EC tablet Take 325 mg by mouth 2 (two) times daily with a meal. 11/09/21  Yes Ladene Artist, MD  hydrALAZINE (APRESOLINE) 100 MG tablet Take 1 tablet (100 mg total) by mouth every 8 (eight) hours. 08/16/22 07/16/23 Yes Nafziger, Kandee Keen, NP  ibuprofen (ADVIL) 200 MG tablet Take 200 mg by mouth every 6 (six) hours as needed.   Yes [provider]  polyvinyl alcohol (LIQUIFILM TEARS) 1.4 % ophthalmic solution Place 1 drop into both eyes daily as needed for dry eyes.   Yes [provider]  simvastatin (ZOCOR) 40 MG tablet TAKE 1 TABLET(40 MG) BY MOUTH DAILY 04/19/21  Yes Nafziger, Kandee Keen, NP  carvedilol (COREG) 3.125 MG tablet Take 1 tablet (3.125 mg total) by mouth 2 (two) times daily with a  meal. 08/16/22 03/11/23  Shirline Frees, NP                                                                                                                                    Past Surgical History Past Surgical History:  Procedure Laterality Date   APPENDECTOMY     BIOPSY  08/03/2020   Procedure: BIOPSY;  Surgeon: Iva Boop, MD;  [facility] ENDOSCOPY;  Service: Endoscopy;;   COLONOSCOPY WITH PROPOFOL N/A 08/03/2020   Procedure: COLONOSCOPY WITH PROPOFOL;  Surgeon: Iva Boop, MD;  Location: [facility] ENDOSCOPY;  Service:  Endoscopy;  Laterality: N/A;   ESOPHAGOGASTRODUODENOSCOPY (EGD) WITH PROPOFOL N/A 08/03/2020   Procedure: ESOPHAGOGASTRODUODENOSCOPY (EGD) WITH PROPOFOL;  Surgeon: Iva Boop, MD;  Location: [facility] ENDOSCOPY;  Service: Endoscopy;  Laterality: N/A;   TONSILLECTOMY     Family History Family History  Problem Relation Age of Onset   Arthritis Neg Hx        family   Hypertension Neg Hx        family   Stroke Neg Hx        family , 1st degree relative   Colon cancer Neg Hx    Colon polyps Neg Hx    Esophageal cancer Neg Hx    Rectal cancer Neg Hx    Stomach cancer Neg Hx     Social History Social History   Tobacco Use   Smoking status: Every Day    Current packs/day: 0.30    Types: Cigarettes   Smokeless tobacco: Never  Vaping Use   Vaping status: Never Used  Substance Use Topics   Alcohol use: No   Drug use: No   Allergies Patient has no known allergies.  Review of Systems A thorough review of systems was obtained and all systems are negative except as noted in the HPI and PMH.   Physical Exam Vital Signs  I have reviewed the triage vital signs BP (!) 163/76   Pulse 64   Temp 97.9 F (36.6 C)   Resp (!) 22   Ht 5\' 9"  (1.753 m)   Wt 82 kg   SpO2 97%   BMI 26.70 kg/m  Physical Exam Vitals and nursing note reviewed.  Constitutional:      General: He is not in acute distress.    Appearance: He is well-developed. He is not ill-appearing.  HENT:     Head: Normocephalic and atraumatic.     Right Ear: External ear normal.     Left Ear: External ear normal.     Mouth/Throat:     Mouth: Mucous membranes are moist.  Eyes:     General: No scleral icterus. Cardiovascular:     Rate and Rhythm: Normal rate and regular rhythm.     Pulses: Normal pulses.     Heart sounds: Normal heart sounds.  Pulmonary:     Effort: Pulmonary effort is normal. No respiratory  distress.     Breath sounds: Normal breath sounds.  Abdominal:     General: Abdomen is flat.      Palpations: Abdomen is soft.     Tenderness: There is no abdominal tenderness.  Musculoskeletal:     Cervical back: No rigidity.     Right lower leg: Edema present.     Left lower leg: Edema present.     Comments: 1+ pitting to mid tibia   Mild pain / swelling to right knee, no erythema. Full rom w/o sig pain  Skin:    General: Skin is warm and dry.     Capillary Refill: Capillary refill takes less than 2 seconds.  Neurological:     Mental Status: He is alert.  Psychiatric:        Mood and Affect: Mood normal.        Behavior: Behavior normal.     ED Results and Treatments Labs (all labs ordered are listed, but only abnormal results are displayed) Labs Reviewed  BASIC METABOLIC PANEL - Abnormal; Notable for the following components:      Result Value   BUN 30 (*)    Creatinine, Ser 1.99 (*)    GFR, Estimated 31 (*)    All other components within normal limits  CBC - Abnormal; Notable for the following components:   Hemoglobin 11.8 (*)    HCT 37.9 (*)    All other components within normal limits  BRAIN NATRIURETIC PEPTIDE - Abnormal; Notable for the following components:   B Natriuretic Peptide 113.2 (*)    All other components within normal limits  TROPONIN I (HIGH SENSITIVITY) - Abnormal; Notable for the following components:   Troponin I (High Sensitivity) 48 (*)    All other components within normal limits  TROPONIN I (HIGH SENSITIVITY) - Abnormal; Notable for the following components:   Troponin I (High Sensitivity) 64 (*)    All other components within normal limits  TROPONIN I (HIGH SENSITIVITY) - Abnormal; Notable for the following components:   Troponin I (High Sensitivity) 76 (*)    All other components within normal limits                                                                                                                          Radiology DG Knee 2 Views Right Result Date: 07/16/2023 CLINICAL DATA:  Leg swelling EXAM: RIGHT KNEE - 1-2 VIEW  COMPARISON:  None Available. FINDINGS: No evidence of fracture, dislocation, or joint effusion. Severe medial compartment osteoarthritis with complete joint space loss. Atherosclerotic vascular calcifications. Soft tissues are unremarkable. IMPRESSION: Severe medial compartment osteoarthritis of the right knee. No acute findings. Electronically Signed   By: Duanne Guess D.O.   On: 07/16/2023 14:40   DG Chest 2 View Result Date: 07/16/2023 CLINICAL DATA:  Chest pain.  Bilateral lower extremity swelling. EXAM: CHEST - 2 VIEW COMPARISON:  Chest radiograph dated July 30, 2020. PET-CT dated November 23, 2022. FINDINGS: Stable cardiomegaly.  Aortic atherosclerosis. Central pulmonary vascular congestion. No overt pulmonary edema. No focal consolidation, pleural effusion, or pneumothorax. The previously noted bilateral lung nodules, including the part solid nodule in the superior segment of the left lower lobe, are not well visualized on this exam and better evaluated on the prior PET-CT dated November 23, 2022. No acute osseous abnormality. IMPRESSION: 1. Stable cardiomegaly with central pulmonary vascular congestion. 2. The previously noted bilateral lung nodules, including the part solid nodule in the superior segment of the left lower lobe, are not well visualized on this exam and better evaluated on the prior PET-CT dated November 23, 2022. Electronically Signed   By: Hart Robinsons M.D.   On: 07/16/2023 12:16    Pertinent labs & imaging results that were available during my care of the patient were reviewed by me and considered in my medical decision making (see MDM for details).  Medications Ordered in ED Medications  furosemide (LASIX) injection 40 mg (40 mg Intravenous Given 07/16/23 1251)  amLODipine (NORVASC) tablet 10 mg (10 mg Oral Given 07/16/23 1250)  hydrALAZINE (APRESOLINE) injection 5 mg (5 mg Intravenous Given 07/16/23 1410)  hydrALAZINE (APRESOLINE) injection 5 mg (5 mg Intravenous Given 07/16/23  1521)  hydrALAZINE (APRESOLINE) injection 5 mg (5 mg Intravenous Given 07/16/23 1603)  amLODipine (NORVASC) tablet 5 mg (5 mg Oral Given 07/16/23 1602)                                                                                                                                     Procedures Procedures  CRITICAL CARE Performed by: Sloan Leiter   Total critical care time: 32 minutes  Critical care time was exclusive of separately billable procedures and treating other patients.  Critical care was necessary to treat or prevent imminent or life-threatening deterioration.  Critical care was time spent personally by me on the following activities: development of treatment plan with patient and/or surrogate as well as nursing, discussions with consultants, evaluation of patient's response to treatment, examination of patient, obtaining history from patient or surrogate, ordering and performing treatments and interventions, ordering and review of laboratory studies, ordering and review of radiographic studies, pulse oximetry and re-evaluation of patient's condition. +trop, +bp requiring complex antihypertensive treatment   (including critical care time)  Medical Decision Making / ED Course    Medical Decision Making:    GARV KUECHLE is a 89 y.o. male with past medical history as below, significant for tobacco abuse, hypertension, IDA, CKD, CAD, anxiety who presents to the ED with complaint of low blood pressure, leg swelling, knee pain. The complaint involves an extensive differential diagnosis and also carries with it a high risk of complications and morbidity.  Serious etiology was considered. Ddx includes but is not limited to: Differential includes all life-threatening causes for chest pain. This includes but is not exclusive to acute coronary syndrome, aortic dissection, pulmonary embolism, cardiac tamponade, community-acquired pneumonia,  pericarditis, musculoskeletal chest wall pain,  acute CHF, volume overload, etc.   Complete initial physical exam performed, notably the patient was in no acute distress, blood pressure is elevated..    Reviewed and confirmed nursing documentation for past medical history, family history, social history.  Vital signs reviewed.      Clinical Course as of 07/17/23 0719  Tue Jul 16, 2023  1142 Creatinine(!): 1.99 Similar to prior [SG]  1206 BP(!): 228/86 Coreg/amlodipine/hydralazine at home, stopped taking them all >4 wks ago [SG]  1359 BP(!): 232/89 Bp remains elevated, will give hydralazine  [SG]  1410 BP(!): 174/76 BP improving [SG]    Clinical Course User Index [SG] Sloan Leiter, DO    Brief summary: 89 year old male with history as above here with elevated blood pressure, leg swelling, transient episode of chest pain.  Blood pressure elevated, he stopped taking all of his antihypertensives.  Will give antihypertensive here.  Also give Lasix.  He is not currently having any chest pain, he had chest pain for couple seconds this morning while getting to his vehicle which has since resolved.  He has poorly controlled htn, has not taken his medications in >2 mos per family. Difficult to control his BP here, requiring multiple anti-hypertensive's. He has leg swelling, CXR findings c/w CHF, no hyoxia. Lasix was given. His bp has improved modestly, handoff to dr zackowski pending better bp control and dispo decision.               Additional history obtained: -Additional history obtained from family -External records from outside source obtained and reviewed including: Chart review including previous notes, labs, imaging, consultation notes including  Home meds Primary care documentation  Prior vitals   Lab Tests: -I ordered, reviewed, and interpreted labs.   The pertinent results include:   Labs Reviewed  BASIC METABOLIC PANEL - Abnormal; Notable for the following components:      Result Value   BUN 30 (*)     Creatinine, Ser 1.99 (*)    GFR, Estimated 31 (*)    All other components within normal limits  CBC - Abnormal; Notable for the following components:   Hemoglobin 11.8 (*)    HCT 37.9 (*)    All other components within normal limits  BRAIN NATRIURETIC PEPTIDE - Abnormal; Notable for the following components:   B Natriuretic Peptide 113.2 (*)    All other components within normal limits  TROPONIN I (HIGH SENSITIVITY) - Abnormal; Notable for the following components:   Troponin I (High Sensitivity) 48 (*)    All other components within normal limits  TROPONIN I (HIGH SENSITIVITY) - Abnormal; Notable for the following components:   Troponin I (High Sensitivity) 64 (*)    All other components within normal limits  TROPONIN I (HIGH SENSITIVITY) - Abnormal; Notable for the following components:   Troponin I (High Sensitivity) 76 (*)    All other components within normal limits    Notable for trop +  EKG   EKG Interpretation Date/Time:  Tuesday July 16 2023 10:08:50 EST Ventricular Rate:  68 PR Interval:  208 QRS Duration:  92 QT Interval:  410 QTC Calculation: 435 R Axis:   82  Text Interpretation: Normal sinus rhythm Possible Left atrial enlargement Anteroseptal infarct , age undetermined Abnormal ECG When compared with ECG of 29-Jul-2020 12:04, PREVIOUS ECG IS PRESENT no stemi Interpretation limited secondary to artifact Confirmed by Tanda Rockers ([id number]) on 07/16/2023 11:26:59 AM  Imaging Studies ordered: I ordered imaging studies including cxr knee xr I independently visualized the following imaging with scope of interpretation limited to determining acute life threatening conditions related to emergency care; findings noted above I independently visualized and interpreted imaging. I agree with the radiologist interpretation   Medicines ordered and prescription drug management: Meds ordered this encounter  Medications   furosemide (LASIX) injection 40 mg    amLODipine (NORVASC) tablet 10 mg   hydrALAZINE (APRESOLINE) injection 5 mg   hydrALAZINE (APRESOLINE) injection 5 mg   DISCONTD: carvedilol (COREG) tablet 3.125 mg   hydrALAZINE (APRESOLINE) injection 5 mg   amLODipine (NORVASC) tablet 5 mg    -I have reviewed the patients home medicines and have made adjustments as needed   Consultations Obtained: na   Cardiac Monitoring: The patient was maintained on a cardiac monitor.  I personally viewed and interpreted the cardiac monitored which showed an underlying rhythm of: nsr Continuous pulse oximetry interpreted by myself, 95% on RA.    Social Determinants of Health:  Diagnosis or treatment significantly limited by social determinants of health: current smoker, not taking medications    Reevaluation: After the interventions noted above, I reevaluated the patient and found that they have improved  Co morbidities that complicate the patient evaluation  Past Medical History:  Diagnosis Date   Adenocarcinoma of rectum (HCC)    Anxiety    BENIGN PROSTATIC HYPERTROPHY 04/01/2008   Coronary artery disease    GERD (gastroesophageal reflux disease)    HYPERLIPIDEMIA 05/26/2007   HYPERTENSION 02/07/2007   Syncope and collapse    TOBACCO ABUSE 04/01/2008      Dispostion: Disposition decision including need for hospitalization was considered, and patient disposition pending at time of sign out.    Final Clinical Impression(s) / ED Diagnoses Final diagnoses:  Hypertensive urgency  Elevated troponin        Sloan Leiter, DO 07/17/23 0719

## 2023-07-16 NOTE — Discharge Instructions (Signed)
He was seen today for swelling of feet/ankles, but his blood pressure if very elevated.  Please go to the ER for further evaluation.

## 2023-07-16 NOTE — ED Notes (Signed)
Patient is being discharged from the Urgent Care and sent to the Emergency Department via personal opperated vehicle . Per Dr Haynes Bast, patient is in need of higher level of care due to Hypertension. Patient is aware and verbalizes understanding of plan of care.  Vitals:   07/16/23 0852 07/16/23 0854  BP: (!) 241/118 (!) 240/100  Pulse: 78   Resp: 18   Temp: 98.1 F (36.7 C)   SpO2: 99%

## 2023-07-16 NOTE — ED Notes (Signed)
Report given to the next RN.Marland KitchenMarland Kitchen

## 2023-07-17 ENCOUNTER — Observation Stay (HOSPITAL_COMMUNITY): Payer: No Typology Code available for payment source

## 2023-07-17 ENCOUNTER — Other Ambulatory Visit: Payer: Self-pay

## 2023-07-17 DIAGNOSIS — I169 Hypertensive crisis, unspecified: Secondary | ICD-10-CM | POA: Diagnosis present

## 2023-07-17 DIAGNOSIS — N184 Chronic kidney disease, stage 4 (severe): Secondary | ICD-10-CM | POA: Diagnosis present

## 2023-07-17 DIAGNOSIS — I130 Hypertensive heart and chronic kidney disease with heart failure and stage 1 through stage 4 chronic kidney disease, or unspecified chronic kidney disease: Secondary | ICD-10-CM | POA: Diagnosis present

## 2023-07-17 DIAGNOSIS — Z91128 Patient's intentional underdosing of medication regimen for other reason: Secondary | ICD-10-CM | POA: Diagnosis not present

## 2023-07-17 DIAGNOSIS — N400 Enlarged prostate without lower urinary tract symptoms: Secondary | ICD-10-CM | POA: Diagnosis present

## 2023-07-17 DIAGNOSIS — Z85048 Personal history of other malignant neoplasm of rectum, rectosigmoid junction, and anus: Secondary | ICD-10-CM | POA: Diagnosis not present

## 2023-07-17 DIAGNOSIS — F17210 Nicotine dependence, cigarettes, uncomplicated: Secondary | ICD-10-CM | POA: Diagnosis present

## 2023-07-17 DIAGNOSIS — I5032 Chronic diastolic (congestive) heart failure: Secondary | ICD-10-CM

## 2023-07-17 DIAGNOSIS — Z91148 Patient's other noncompliance with medication regimen for other reason: Secondary | ICD-10-CM | POA: Diagnosis not present

## 2023-07-17 DIAGNOSIS — Z9049 Acquired absence of other specified parts of digestive tract: Secondary | ICD-10-CM | POA: Diagnosis not present

## 2023-07-17 DIAGNOSIS — E785 Hyperlipidemia, unspecified: Secondary | ICD-10-CM | POA: Diagnosis present

## 2023-07-17 DIAGNOSIS — Z7409 Other reduced mobility: Secondary | ICD-10-CM | POA: Diagnosis present

## 2023-07-17 DIAGNOSIS — I2510 Atherosclerotic heart disease of native coronary artery without angina pectoris: Secondary | ICD-10-CM | POA: Diagnosis present

## 2023-07-17 DIAGNOSIS — Z79899 Other long term (current) drug therapy: Secondary | ICD-10-CM | POA: Diagnosis not present

## 2023-07-17 DIAGNOSIS — M1711 Unilateral primary osteoarthritis, right knee: Secondary | ICD-10-CM | POA: Diagnosis present

## 2023-07-17 DIAGNOSIS — Z91199 Patient's noncompliance with other medical treatment and regimen due to unspecified reason: Secondary | ICD-10-CM | POA: Diagnosis not present

## 2023-07-17 DIAGNOSIS — I160 Hypertensive urgency: Secondary | ICD-10-CM | POA: Diagnosis present

## 2023-07-17 LAB — BASIC METABOLIC PANEL
Anion gap: 8 (ref 5–15)
BUN: 30 mg/dL — ABNORMAL HIGH (ref 8–23)
CO2: 23 mmol/L (ref 22–32)
Calcium: 8.8 mg/dL — ABNORMAL LOW (ref 8.9–10.3)
Chloride: 107 mmol/L (ref 98–111)
Creatinine, Ser: 1.73 mg/dL — ABNORMAL HIGH (ref 0.61–1.24)
GFR, Estimated: 37 mL/min — ABNORMAL LOW (ref >60)
Glucose, Bld: 138 mg/dL — ABNORMAL HIGH (ref 70–99)
Potassium: 3.7 mmol/L (ref 3.5–5.1)
Sodium: 138 mmol/L (ref 135–145)

## 2023-07-17 LAB — ECHOCARDIOGRAM COMPLETE
AR max vel: 2.63 cm2
AV Area VTI: 2.56 cm2
AV Area mean vel: 2.6 cm2
AV Mean grad: 8 mm[Hg]
AV Peak grad: 14 mm[Hg]
Ao pk vel: 1.87 m/s
Area-P 1/2: 2.28 cm2
Calc EF: 66.2 %
Height: 69 in
MV VTI: 2.16 cm2
S' Lateral: 2.7 cm
Single Plane A2C EF: 69.7 %
Single Plane A4C EF: 62.5 %
Weight: 2892.44 [oz_av]

## 2023-07-17 MED ORDER — CARVEDILOL 3.125 MG PO TABS: 3.1250 mg | ORAL_TABLET | Freq: Two times a day (BID) | ORAL | Status: DC

## 2023-07-17 MED ORDER — ALBUTEROL SULFATE (2.5 MG/3ML) 0.083% IN NEBU: 2.5000 mg | INHALATION_SOLUTION | RESPIRATORY_TRACT | Status: DC | PRN

## 2023-07-17 MED ORDER — CARVEDILOL 3.125 MG PO TABS
3.1250 mg | ORAL_TABLET | Freq: Two times a day (BID) | ORAL | Status: DC
Start: 2023-07-17 — End: 2023-07-19
  Administered 2023-07-17 – 2023-07-19 (×5): 3.125 mg via ORAL
  Filled 2023-07-17 (×5): qty 1

## 2023-07-17 MED ORDER — HEPARIN SODIUM (PORCINE) 5000 UNIT/ML IJ SOLN
5000.0000 [IU] | Freq: Three times a day (TID) | INTRAMUSCULAR | Status: DC
  Administered 2023-07-17 – 2023-07-19 (×6): 5000 [IU] via SUBCUTANEOUS
  Filled 2023-07-17 (×6): qty 1

## 2023-07-17 MED ORDER — ACETAMINOPHEN 650 MG RE SUPP: 650.0000 mg | Freq: Four times a day (QID) | RECTAL | Status: DC | PRN

## 2023-07-17 MED ORDER — ONDANSETRON HCL 4 MG PO TABS: 4.0000 mg | ORAL_TABLET | Freq: Four times a day (QID) | ORAL | Status: DC | PRN

## 2023-07-17 MED ORDER — ONDANSETRON HCL 4 MG/2ML IJ SOLN: 4.0000 mg | Freq: Four times a day (QID) | INTRAMUSCULAR | Status: DC | PRN

## 2023-07-17 MED ORDER — TRAZODONE HCL 50 MG PO TABS: 25.0000 mg | ORAL_TABLET | Freq: Every evening | ORAL | Status: DC | PRN

## 2023-07-17 MED ORDER — HYDRALAZINE HCL 50 MG PO TABS
100.0000 mg | ORAL_TABLET | Freq: Three times a day (TID) | ORAL | Status: DC
  Administered 2023-07-17 – 2023-07-19 (×8): 100 mg via ORAL
  Filled 2023-07-17 (×8): qty 2

## 2023-07-17 MED ORDER — ACETAMINOPHEN 325 MG PO TABS
650.0000 mg | ORAL_TABLET | Freq: Four times a day (QID) | ORAL | Status: DC | PRN
  Administered 2023-07-19: 650 mg via ORAL
  Filled 2023-07-17: qty 2

## 2023-07-17 MED ORDER — SIMVASTATIN 40 MG PO TABS
40.0000 mg | ORAL_TABLET | Freq: Every day | ORAL | Status: DC
Start: 2023-07-17 — End: 2023-07-19
  Administered 2023-07-17 – 2023-07-18 (×2): 40 mg via ORAL
  Filled 2023-07-17 (×2): qty 1

## 2023-07-17 NOTE — ED Notes (Signed)
Pt has no c/o chest pain at this time and pt was sleeping when entered the room.  VSS Pt updated that we are waiting for a room.

## 2023-07-17 NOTE — Progress Notes (Signed)
Pt in SVT HR @ 174. Placed on bedside monitor. Rapid RN, Chart RN and MD notified. Hotel manager @ bedside. BP- 167/64. Pulse back to 75 within 3 min. MD called primary RN @ bedside.  Pt stable. Family and Pt updated on event.

## 2023-07-17 NOTE — H&P (Signed)
 History and Physical  Isaac Phelps UJW:[id number] DOB: Feb 05, 1931 DOA: 07/16/2023  PCP: Shirline Frees, NP   Chief Complaint: Dizziness, leg swelling  HPI: Isaac Phelps is a 89 y.o. male with medical history significant for treated rectal cancer, hypertension, BPH admitted to the hospital with hypertensive urgency.  Patient lives alone with the assistance of his daughter who takes him to appointments, etc.  He says that he is usually compliant with his medications, however has not taken his blood pressure medication for a few weeks because he never got around to following up with his PCP.  They would not provide him refills until he had a follow-up appointment.  He tells me that for the last few days he was feeling a little bit dizzy, he also has lower extremity swelling around his ankles says that he has had this "for a while."He denies any chest pain, fevers, orthopnea.  Workup in the emergency department shows significant hypertension, mildly elevated troponin.  He was given IV Lasix, several antihypertensive medications.  Currently states that he feels back to normal and has no complaints.  Review of Systems: Please see HPI for pertinent positives and negatives. A complete 10 system review of systems are otherwise negative.  Past Medical History:  Diagnosis Date   Adenocarcinoma of rectum (HCC)    Anxiety    BENIGN PROSTATIC HYPERTROPHY 04/01/2008   Coronary artery disease    GERD (gastroesophageal reflux disease)    HYPERLIPIDEMIA 05/26/2007   HYPERTENSION 02/07/2007   Syncope and collapse    TOBACCO ABUSE 04/01/2008   Past Surgical History:  Procedure Laterality Date   APPENDECTOMY     BIOPSY  08/03/2020   Procedure: BIOPSY;  Surgeon: Iva Boop, MD;  Location: [facility] ENDOSCOPY;  Service: Endoscopy;;   COLONOSCOPY WITH PROPOFOL N/A 08/03/2020   Procedure: COLONOSCOPY WITH PROPOFOL;  Surgeon: Iva Boop, MD;  Location: [facility] ENDOSCOPY;  Service: Endoscopy;  Laterality: N/A;    ESOPHAGOGASTRODUODENOSCOPY (EGD) WITH PROPOFOL N/A 08/03/2020   Procedure: ESOPHAGOGASTRODUODENOSCOPY (EGD) WITH PROPOFOL;  Surgeon: Iva Boop, MD;  Location: [facility] ENDOSCOPY;  Service: Endoscopy;  Laterality: N/A;   TONSILLECTOMY     Social History:  reports that he has been smoking cigarettes. He has never used smokeless tobacco. He reports that he does not drink alcohol and does not use drugs.  No Known Allergies  Family History  Problem Relation Age of Onset   Arthritis Neg Hx        family   Hypertension Neg Hx        family   Stroke Neg Hx        family , 1st degree relative   Colon cancer Neg Hx    Colon polyps Neg Hx    Esophageal cancer Neg Hx    Rectal cancer Neg Hx    Stomach cancer Neg Hx      Prior to Admission medications   Medication Sig Start Date End Date Taking? Authorizing Provider  acetaminophen (TYLENOL) 650 MG CR tablet Take 650 mg by mouth daily as needed for pain.   Yes [provider]  amLODipine (NORVASC) 10 MG tablet Take 1 tablet (10 mg total) by mouth daily. 08/16/22 07/16/23 Yes Nafziger, Kandee Keen, NP  ferrous sulfate 325 (65 FE) MG EC tablet Take 325 mg by mouth 2 (two) times daily with a meal. 11/09/21  Yes Ladene Artist, MD  hydrALAZINE (APRESOLINE) 100 MG tablet Take 1 tablet (100 mg total) by mouth every 8 (eight) hours.  08/16/22 07/16/23 Yes Nafziger, Kandee Keen, NP  ibuprofen (ADVIL) 200 MG tablet Take 200 mg by mouth every 6 (six) hours as needed.   Yes [provider]  polyvinyl alcohol (LIQUIFILM TEARS) 1.4 % ophthalmic solution Place 1 drop into both eyes daily as needed for dry eyes.   Yes [provider]  simvastatin (ZOCOR) 40 MG tablet TAKE 1 TABLET(40 MG) BY MOUTH DAILY 04/19/21  Yes Nafziger, Kandee Keen, NP  carvedilol (COREG) 3.125 MG tablet Take 1 tablet (3.125 mg total) by mouth 2 (two) times daily with a meal. 08/16/22 03/11/23  Shirline Frees, NP    Physical Exam: BP (!) 219/92 (BP Location: Left Arm)   Pulse 65   Temp  98.4 F (36.9 C) (Oral)   Resp (!) 22   Ht 5\' 9"  (1.753 m)   Wt 82 kg   SpO2 100%   BMI 26.70 kg/m  General:  Alert, oriented, calm, in no acute distress, elderly gentleman who appears slightly younger than his stated age.  He is a good historian. Eyes: EOMI, clear conjuctivae, white sclerea Neck: supple, no masses, trachea mildline  Cardiovascular: RRR, no murmurs or rubs, he has some mild ankle edema Respiratory: clear to auscultation bilaterally, no wheezes, no crackles  Abdomen: soft, nontender, nondistended, normal bowel tones heard  Skin: dry, no rashes  Musculoskeletal: no joint effusions, normal range of motion  Psychiatric: appropriate affect, normal speech  Neurologic: extraocular muscles intact, clear speech, moving all extremities with intact sensorium         Labs on Admission:  Basic Metabolic Panel: Recent Labs  Lab 07/16/23 1004  NA 139  K 3.8  CL 105  CO2 25  GLUCOSE 94  BUN 30*  CREATININE 1.99*  CALCIUM 9.6   Liver Function Tests: No results for input(s): "AST", "ALT", "ALKPHOS", "BILITOT", "PROT", "ALBUMIN" in the last 168 hours. No results for input(s): "LIPASE", "AMYLASE" in the last 168 hours. No results for input(s): "AMMONIA" in the last 168 hours. CBC: Recent Labs  Lab 07/16/23 1004  WBC 5.6  HGB 11.8*  HCT 37.9*  MCV 85.9  PLT 242   Cardiac Enzymes: No results for input(s): "CKTOTAL", "CKMB", "CKMBINDEX", "TROPONINI" in the last 168 hours. BNP (last 3 results) Recent Labs    07/16/23 1004  BNP 113.2*    ProBNP (last 3 results) No results for input(s): "PROBNP" in the last 8760 hours.  CBG: No results for input(s): "GLUCAP" in the last 168 hours.  Radiological Exams on Admission: DG Knee 2 Views Right Result Date: 07/16/2023 CLINICAL DATA:  Leg swelling EXAM: RIGHT KNEE - 1-2 VIEW COMPARISON:  None Available. FINDINGS: No evidence of fracture, dislocation, or joint effusion. Severe medial compartment osteoarthritis with  complete joint space loss. Atherosclerotic vascular calcifications. Soft tissues are unremarkable. IMPRESSION: Severe medial compartment osteoarthritis of the right knee. No acute findings. Electronically Signed   By: Duanne Guess D.O.   On: 07/16/2023 14:40   DG Chest 2 View Result Date: 07/16/2023 CLINICAL DATA:  Chest pain.  Bilateral lower extremity swelling. EXAM: CHEST - 2 VIEW COMPARISON:  Chest radiograph dated July 30, 2020. PET-CT dated November 23, 2022. FINDINGS: Stable cardiomegaly. Aortic atherosclerosis. Central pulmonary vascular congestion. No overt pulmonary edema. No focal consolidation, pleural effusion, or pneumothorax. The previously noted bilateral lung nodules, including the part solid nodule in the superior segment of the left lower lobe, are not well visualized on this exam and better evaluated on the prior PET-CT dated November 23, 2022. No acute  osseous abnormality. IMPRESSION: 1. Stable cardiomegaly with central pulmonary vascular congestion. 2. The previously noted bilateral lung nodules, including the part solid nodule in the superior segment of the left lower lobe, are not well visualized on this exam and better evaluated on the prior PET-CT dated November 23, 2022. Electronically Signed   By: Hart Robinsons M.D.   On: 07/16/2023 12:16   Assessment/Plan Isaac Phelps is a 89 y.o. male with medical history significant for treated rectal cancer, hypertension, CKD 4 BPH admitted to the hospital with hypertensive urgency.   Hypertensive urgency-with some dizziness, lower extremity edema, due to medication noncompliance.  No chest pain, headache, confusion or other alarm symptoms. -Observation admission -Monitor on telemetry -Resume home Coreg and p.o. hydralazine  Chronic heart failure with preserved EF-he has some mild lower extremity edema, this may simply be related to his uncontrolled hypertension.  Does not appear to be on scheduled diuretics at baseline.  Received a dose  of IV Lasix prior to admission. -Update 2D echo  Elevated troponin-likely mild troponin leak due to significant hypertension, patient without chest pain or acute EKG changes.   Hyperlipidemia-continue home simvastatin  DVT prophylaxis: Heparin subcu    Code Status: Full Code  Consults called: None  Admission status: Observation  Time spent: 48 minutes  Kelijah Towry Sharlette Dense MD [facility] Pager [phone]  If 7PM-7AM, please contact night-coverage [email or web address] Password [facility]  07/17/2023, 9:41 AM

## 2023-07-17 NOTE — ED Notes (Signed)
Updated the patient on transport arriving in approx 15 minutes. Patient voices no needs at this time.

## 2023-07-17 NOTE — Plan of Care (Deleted)
Patient went into SVT this morning for about 3 minutes, remained stable. Resolved spontaneously.  Requested cardiology consultation, also checking electrolytes including BMP and Mg.

## 2023-07-18 DIAGNOSIS — I169 Hypertensive crisis, unspecified: Secondary | ICD-10-CM | POA: Diagnosis not present

## 2023-07-18 DIAGNOSIS — I5032 Chronic diastolic (congestive) heart failure: Secondary | ICD-10-CM

## 2023-07-18 LAB — BASIC METABOLIC PANEL
Anion gap: 9 (ref 5–15)
BUN: 30 mg/dL — ABNORMAL HIGH (ref 8–23)
CO2: 23 mmol/L (ref 22–32)
Calcium: 8.6 mg/dL — ABNORMAL LOW (ref 8.9–10.3)
Chloride: 107 mmol/L (ref 98–111)
Creatinine, Ser: 2.03 mg/dL — ABNORMAL HIGH (ref 0.61–1.24)
GFR, Estimated: 31 mL/min — ABNORMAL LOW (ref >60)
Glucose, Bld: 117 mg/dL — ABNORMAL HIGH (ref 70–99)
Potassium: 3.7 mmol/L (ref 3.5–5.1)
Sodium: 139 mmol/L (ref 135–145)

## 2023-07-18 LAB — CBC
HCT: 33.8 % — ABNORMAL LOW (ref 39.0–52.0)
Hemoglobin: 10.6 g/dL — ABNORMAL LOW (ref 13.0–17.0)
MCH: 27 pg (ref 26.0–34.0)
MCHC: 31.4 g/dL (ref 30.0–36.0)
MCV: 86.2 fL (ref 80.0–100.0)
Platelets: 225 10*3/uL (ref 150–400)
RBC: 3.92 MIL/uL — ABNORMAL LOW (ref 4.22–5.81)
RDW: 15.6 % — ABNORMAL HIGH (ref 11.5–15.5)
WBC: 6.1 10*3/uL (ref 4.0–10.5)
nRBC: 0 % (ref 0.0–0.2)

## 2023-07-18 MED ORDER — FUROSEMIDE 10 MG/ML IJ SOLN
20.0000 mg | Freq: Once | INTRAMUSCULAR | Status: AC
  Administered 2023-07-18: 20 mg via INTRAVENOUS
  Filled 2023-07-18: qty 2

## 2023-07-18 NOTE — Assessment & Plan Note (Signed)
-   Out of medications due to no further refills for multiple weeks.  Have discussed this with his daughter as well as he will need to follow-up with PCP -BP better controlled this morning after restarting medications - Repeat echo also obtained showing grade 1 diastolic dysfunction, moderate LVH.  EF 65 to 70%

## 2023-07-18 NOTE — Assessment & Plan Note (Signed)
-   continue Coreg and hydralazine - lowered amlodipine dose at discharge

## 2023-07-18 NOTE — Assessment & Plan Note (Signed)
-  continue zocor

## 2023-07-18 NOTE — Assessment & Plan Note (Addendum)
-   no s/s exac; trop leak from uncontrolled HTN; very mild BNP elevation - repeat echo noted as above  - trial of lasix

## 2023-07-18 NOTE — Evaluation (Signed)
Occupational Therapy Evaluation Patient Details Name: Isaac Phelps MRN: [id number] DOB: [birth year] Today's Date: 07/18/2023   History of Present Illness   89 y.o. male with medical history significant for treated rectal cancer, hypertension, BPH admitted to the hospital with hypertensive urgency.     Clinical Impressions Patient admitted for the diagnosis above.  PTA he lives at home alone, but family does check on him and assist as needed for meals and iADL.  Patient typically walks with a SPC.  Currently he is CGA to supervision for mobility and CGA to Min A for lower body ADL.  Patient should return to his baseline quickly, so no post acute OT is anticipated.  OT will continue efforts in the acute setting to address deficits.       If plan is discharge home, recommend the following:   Assist for transportation;Assistance with cooking/housework     Functional Status Assessment   Patient has had a recent decline in their functional status and demonstrates the ability to make significant improvements in function in a reasonable and predictable amount of time.     Equipment Recommendations   None recommended by OT     Recommendations for Other Services         Precautions/Restrictions   Precautions Precautions: Fall Restrictions Weight Bearing Restrictions Per Provider Order: No     Mobility Bed Mobility Overal bed mobility: Needs Assistance Bed Mobility: Supine to Sit     Supine to sit: Supervision          Transfers Overall transfer level: Needs assistance Equipment used: Rolling walker (2 wheels) Transfers: Sit to/from Stand, Bed to chair/wheelchair/BSC Sit to Stand: Contact guard assist, Min assist     Step pivot transfers: Supervision            Balance Overall balance assessment: Needs assistance Sitting-balance support: Feet supported Sitting balance-Leahy Scale: Good     Standing balance support: Reliant on assistive device for  balance Standing balance-Leahy Scale: Fair                             ADL either performed or assessed with clinical judgement   ADL       Grooming: Wash/dry hands;Supervision/safety;Standing               Lower Body Dressing: Contact guard assist;Minimal assistance;Sit to/from stand   Toilet Transfer: Contact guard assist;Ambulation;Rolling walker (2 wheels)                   Vision Patient Visual Report: No change from baseline       Perception Perception: Not tested       Praxis Praxis: Not tested       Pertinent Vitals/Pain Pain Assessment Pain Assessment: No/denies pain     Extremity/Trunk Assessment Upper Extremity Assessment Upper Extremity Assessment: Overall WFL for tasks assessed   Lower Extremity Assessment Lower Extremity Assessment: Defer to PT evaluation   Cervical / Trunk Assessment Cervical / Trunk Assessment: Kyphotic   Communication Communication Communication: No apparent difficulties   Cognition Arousal: Alert Behavior During Therapy: Flat affect Cognition: No apparent impairments                               Following commands: Intact       Cueing  General Comments       Watch BP and HR   Exercises  Shoulder Instructions      Home Living Family/patient expects to be discharged to:: Private residence Living Arrangements: Spouse/significant other Available Help at Discharge: Family;Available PRN/intermittently Type of Home: House Home Access: Stairs to enter     Home Layout: Two level Alternate Level Stairs-Number of Steps: 2 stairs to [employer] Shower/Tub: Chief Strategy Officer: Standard Bathroom Accessibility: Yes How Accessible: Accessible via walker Home Equipment: Cane - single point;Grab bars - tub/shower          Prior Functioning/Environment Prior Level of Function : Independent/Modified Independent             Mobility Comments:  Walks with a SPC ADLs Comments: Performs own ADL, family assists with iADL.  Generally does light meal prep or orders out.    OT Problem List: Decreased activity tolerance;Impaired balance (sitting and/or standing)   OT Treatment/Interventions: Self-care/ADL training;Therapeutic activities;DME and/or AE instruction;Patient/family education      OT Goals(Current goals can be found in the care plan section)   Acute Rehab OT Goals Patient Stated Goal: Return home OT Goal Formulation: With patient Time For Goal Achievement: 08/01/23 Potential to Achieve Goals: Good ADL Goals Pt Will Perform Grooming: with modified independence;standing Pt Will Perform Lower Body Dressing: with modified independence;sit to/from stand Pt Will Transfer to Toilet: with modified independence;regular height toilet;ambulating   OT Frequency:  Min 1X/week    Co-evaluation              AM-PAC OT "6 Clicks" Daily Activity     Outcome Measure Help from another person eating meals?: None Help from another person taking care of personal grooming?: A Little Help from another person toileting, which includes using toliet, bedpan, or urinal?: A Little Help from another person bathing (including washing, rinsing, drying)?: A Little Help from another person to put on and taking off regular upper body clothing?: None Help from another person to put on and taking off regular lower body clothing?: A Little 6 Click Score: 20   End of Session Equipment Utilized During Treatment: Rolling walker (2 wheels) Nurse Communication: Mobility status  Activity Tolerance: Patient tolerated treatment well Patient left: in bed;with call bell/phone within reach;with bed alarm set;with family/visitor present  OT Visit Diagnosis: Unsteadiness on feet (R26.81)                Time: 1440-1505 OT Time Calculation (min): 25 min Charges:  OT General Charges $OT Visit: 1 Visit OT Evaluation $OT Eval Moderate Complexity: 1  Mod OT Treatments $Self Care/Home Management : 8-22 mins  07/18/2023  RP, OTR/L  Acute Rehabilitation Services  Office:  [phone]   Suzanna Obey 07/18/2023, 3:10 PM

## 2023-07-18 NOTE — Assessment & Plan Note (Signed)
-   patient has history of CKD3b. Baseline creat ~ 1.8-2, eGFR~ 32 - watch renal fxn on lasix

## 2023-07-18 NOTE — Progress Notes (Signed)
Progress Note    Isaac Phelps   [name]  DOB: Mar 31, 1931  DOA: 07/16/2023     1 PCP: Shirline Frees, NP  Initial CC: elevated BP  Hospital Course: Isaac Phelps is a 89 y.o. male with medical history significant for treated rectal cancer, hypertension, BPH admitted to the hospital with hypertensive urgency.  Patient lives alone with the assistance of his daughter who takes him to appointments, etc.   He has been out of blood pressure medications for several weeks as he has not wanted to leave the house due to worsening swelling in his feet to go to his primary care. On workup in the ER he was found to have uncontrolled blood pressure as high as 219/92. He denied any chest pain or dyspnea at that time.  Troponins were mildly elevated. He was admitted for achieving blood pressure control.  Interval History:  Admitted yesterday for uncontrolled hypertension.  Explained importance of medication compliance and also spoke with daughter on the phone.  Apparently he has not wanted to leave the house due to swelling in his feet worsening recently.  Assessment and Plan: * Hypertensive crisis-resolved as of 07/18/2023 - Out of medications due to no further refills for multiple weeks.  Have discussed this with his daughter as well as he will need to follow-up with PCP -BP better controlled this morning after restarting medications - Repeat echo also obtained showing grade 1 diastolic dysfunction, moderate LVH.  EF 65 to 70%  Chronic diastolic CHF (congestive heart failure) (HCC) - no s/s exac; trop leak from uncontrolled HTN; very mild BNP elevation - repeat echo noted as above  - trial of lasix  CKD (chronic kidney disease) stage 3, GFR 30-59 ml/min (HCC) - patient has history of CKD3b. Baseline creat ~ 1.8-2, eGFR~ 32 - watch renal fxn on lasix   Essential hypertension - continue Coreg and hydralazine - Hold amlodipine for now  Dyslipidemia - continue zocor    Old records  reviewed in assessment of this patient  Antimicrobials:   DVT prophylaxis:  heparin injection 5,000 Units Start: 07/17/23 1400 SCDs Start: 07/17/23 0939   Code Status:   Code Status: Full Code  Mobility Assessment (Last 72 Hours)     Mobility Assessment     Row Name 07/18/23 1100 07/17/23 2127 07/17/23 1300 07/17/23 0130     Does patient have an order for bedrest or is patient medically unstable No - Continue assessment No - Continue assessment No - Continue assessment No - Continue assessment    What is the highest level of mobility based on the progressive mobility assessment? Level 5 (Walks with assist in room/hall) - Balance while stepping forward/back and can walk in room with assist - Complete Level 5 (Walks with assist in room/hall) - Balance while stepping forward/back and can walk in room with assist - Complete Level 5 (Walks with assist in room/hall) - Balance while stepping forward/back and can walk in room with assist - Complete Level 5 (Walks with assist in room/hall) - Balance while stepping forward/back and can walk in room with assist - Complete             Barriers to discharge: none Disposition Plan:  Home HH orders placed: TBD Status is: Inpt  Objective: Blood pressure (!) 177/79, pulse 66, temperature 98.4 F (36.9 C), temperature source Oral, resp. rate (!) 26, height 5\' 9"  (1.753 m), weight 82 kg, SpO2 97%.  Examination:  Physical Exam Constitutional:  General: He is not in acute distress.    Appearance: Normal appearance.  HENT:     Head: Normocephalic and atraumatic.     Mouth/Throat:     Mouth: Mucous membranes are moist.  Eyes:     Extraocular Movements: Extraocular movements intact.  Cardiovascular:     Rate and Rhythm: Normal rate and regular rhythm.  Pulmonary:     Effort: Pulmonary effort is normal. No respiratory distress.     Breath sounds: Normal breath sounds. No wheezing.  Abdominal:     General: Bowel sounds are normal. There  is no distension.     Palpations: Abdomen is soft.     Tenderness: There is no abdominal tenderness.  Musculoskeletal:        General: Normal range of motion.     Cervical back: Normal range of motion and neck supple.  Skin:    General: Skin is warm and dry.  Neurological:     General: No focal deficit present.     Mental Status: He is alert.  Psychiatric:        Mood and Affect: Mood normal.        Behavior: Behavior normal.      Consultants:    Procedures:    Data Reviewed: Results for orders placed or performed during the hospital encounter of 07/16/23 (from the past 24 hours)  Basic metabolic panel     Status: Abnormal   Collection Time: 07/18/23  4:17 AM  Result Value Ref Range   Sodium 139 135 - 145 mmol/L   Potassium 3.7 3.5 - 5.1 mmol/L   Chloride 107 98 - 111 mmol/L   CO2 23 22 - 32 mmol/L   Glucose, Bld 117 (H) 70 - 99 mg/dL   BUN 30 (H) 8 - 23 mg/dL   Creatinine, Ser 1.61 (H) 0.61 - 1.24 mg/dL   Calcium 8.6 (L) 8.9 - 10.3 mg/dL   GFR, Estimated 31 (L) >60 mL/min   Anion gap 9 5 - 15  CBC     Status: Abnormal   Collection Time: 07/18/23  4:17 AM  Result Value Ref Range   WBC 6.1 4.0 - 10.5 K/uL   RBC 3.92 (L) 4.22 - 5.81 MIL/uL   Hemoglobin 10.6 (L) 13.0 - 17.0 g/dL   HCT 09.6 (L) 04.5 - 40.9 %   MCV 86.2 80.0 - 100.0 fL   MCH 27.0 26.0 - 34.0 pg   MCHC 31.4 30.0 - 36.0 g/dL   RDW 81.1 (H) 91.4 - 78.2 %   Platelets 225 150 - 400 K/uL   nRBC 0.0 0.0 - 0.2 %    I have reviewed pertinent nursing notes, vitals, labs, and images as necessary. I have ordered labwork to follow up on as indicated.  I have reviewed the last notes from staff over past 24 hours. I have discussed patient's care plan and test results with nursing staff, CM/SW, and other staff as appropriate.  Time spent: Greater than 50% of the 55 minute visit was spent in counseling/coordination of care for the patient as laid out in the A&P.   LOS: 1 day   Lewie Chamber, MD [facility] 07/18/2023, 12:17 PM

## 2023-07-18 NOTE — Hospital Course (Signed)
Isaac Phelps is a 89 y.o. male with medical history significant for treated rectal cancer, hypertension, BPH admitted to the hospital with hypertensive urgency.  Patient lives alone with the assistance of his daughter who takes him to appointments, etc.   He has been out of blood pressure medications for several weeks as he has not wanted to leave the house due to worsening swelling in his feet to go to his primary care. On workup in the ER he was found to have uncontrolled blood pressure as high as 219/92. He denied any chest pain or dyspnea at that time.  Troponins were mildly elevated. He was admitted for achieving blood pressure control.

## 2023-07-19 ENCOUNTER — Other Ambulatory Visit (HOSPITAL_COMMUNITY): Payer: Self-pay

## 2023-07-19 ENCOUNTER — Encounter: Payer: Self-pay | Admitting: Oncology

## 2023-07-19 ENCOUNTER — Other Ambulatory Visit: Payer: Self-pay

## 2023-07-19 DIAGNOSIS — Z7409 Other reduced mobility: Secondary | ICD-10-CM

## 2023-07-19 DIAGNOSIS — I5032 Chronic diastolic (congestive) heart failure: Secondary | ICD-10-CM | POA: Diagnosis not present

## 2023-07-19 DIAGNOSIS — I169 Hypertensive crisis, unspecified: Secondary | ICD-10-CM | POA: Diagnosis not present

## 2023-07-19 MED ORDER — ACETAMINOPHEN ER 650 MG PO TBCR: 650.0000 mg | EXTENDED_RELEASE_TABLET | Freq: Every day | ORAL | Status: AC | PRN

## 2023-07-19 MED ORDER — CARVEDILOL 3.125 MG PO TABS
3.1250 mg | ORAL_TABLET | Freq: Two times a day (BID) | ORAL | 0 refills | Status: DC
  Filled 2023-07-19: qty 60, 30d supply, fill #0

## 2023-07-19 MED ORDER — HYDRALAZINE HCL 100 MG PO TABS
100.0000 mg | ORAL_TABLET | Freq: Three times a day (TID) | ORAL | 2 refills | Status: DC
  Filled 2023-07-19: qty 90, 30d supply, fill #0

## 2023-07-19 MED ORDER — AMLODIPINE BESYLATE 5 MG PO TABS
5.0000 mg | ORAL_TABLET | Freq: Every day | ORAL | 0 refills | Status: DC
  Filled 2023-07-19: qty 90, 90d supply, fill #0

## 2023-07-19 NOTE — Evaluation (Signed)
Physical Therapy Evaluation Patient Details Name: Isaac Phelps MRN: [id number] DOB: [birth year] Today's Date: 07/19/2023  History of Present Illness  89 y.o. male admitted to the hospital with hypertensive urgency. Pt with medical history significant for treated rectal cancer, hypertension, BPH.  Clinical Impression  Pt admitted with above diagnosis. Pt ambulated 130' with RW, no loss of balance, vital signs stable. At baseline he walks with a cane, lives alone, denies falls in past 6 months. He would benefit from a RW and HHPT. He reports his daughter is looking into ALFs for him. From a PT standpoint he is safe to DC home. He reports his daughter can bring him groceries.  Pt currently with functional limitations due to the deficits listed below (see PT Problem List). Pt will benefit from acute skilled PT to increase their independence and safety with mobility to allow discharge.           If plan is discharge home, recommend the following: Assistance with cooking/housework;Assist for transportation;Help with stairs or ramp for entrance   Can travel by private vehicle        Equipment Recommendations Rolling walker (2 wheels)  Recommendations for Other Services       Functional Status Assessment Patient has had a recent decline in their functional status and demonstrates the ability to make significant improvements in function in a reasonable and predictable amount of time.     Precautions / Restrictions Precautions Precautions: Fall Recall of Precautions/Restrictions: Intact Restrictions Weight Bearing Restrictions Per Provider Order: No      Mobility  Bed Mobility Overal bed mobility: Modified Independent Bed Mobility: Supine to Sit     Supine to sit: Modified independent (Device/Increase time), HOB elevated, Used rails          Transfers Overall transfer level: Needs assistance Equipment used: Rolling walker (2 wheels) Transfers: Sit to/from Stand Sit to Stand:  Supervision           General transfer comment: VCs hand placement    Ambulation/Gait Ambulation/Gait assistance: Supervision Gait Distance (Feet): 130 Feet Assistive device: Rolling walker (2 wheels) Gait Pattern/deviations: Step-through pattern, Trunk flexed, Decreased stride length Gait velocity: decr     General Gait Details: steady, no loss of balance, pt reports chronic B knee pain 2* arthritis (RN notified of pt request for tylenol)  Stairs            Wheelchair Mobility     Tilt Bed    Modified Rankin (Stroke Patients Only)       Balance Overall balance assessment: Needs assistance Sitting-balance support: Feet supported Sitting balance-Leahy Scale: Good     Standing balance support: Reliant on assistive device for balance Standing balance-Leahy Scale: Fair                               Pertinent Vitals/Pain Pain Assessment Pain Assessment: Faces Faces Pain Scale: Hurts even more Pain Location: B knees (chronic, due to arthritis per pt) with walking Pain Descriptors / Indicators: Aching Pain Intervention(s): Limited activity within patient's tolerance, Monitored during session, Repositioned, Patient requesting pain meds-RN notified    Home Living Family/patient expects to be discharged to:: Private residence Living Arrangements: Spouse/significant other Available Help at Discharge: Family;Available PRN/intermittently Type of Home: House Home Access: Stairs to enter     Alternate Level Stairs-Number of Steps: 2 stairs to kitchen Home Layout: Two level Home Equipment: Cane - single point;Grab bars - tub/shower  Prior Function Prior Level of Function : Independent/Modified Independent             Mobility Comments: Walks with a SPC; denies falls in past 6 months ADLs Comments: Performs own ADL, family assists with iADL.  Generally does light meal prep or orders out. Daughter takes him to get groceries.      Extremity/Trunk Assessment   Upper Extremity Assessment Upper Extremity Assessment: Overall WFL for tasks assessed    Lower Extremity Assessment Lower Extremity Assessment: Overall WFL for tasks assessed    Cervical / Trunk Assessment Cervical / Trunk Assessment: Kyphotic  Communication   Communication Communication: Impaired Factors Affecting Communication: Hearing impaired    Cognition Arousal: Alert Behavior During Therapy: WFL for tasks assessed/performed   PT - Cognitive impairments: No apparent impairments                         Following commands: Intact       Cueing       General Comments      Exercises     Assessment/Plan    PT Assessment Patient needs continued PT services  PT Problem List Decreased mobility;Decreased activity tolerance;Decreased balance       PT Treatment Interventions DME instruction;Gait training;Therapeutic exercise;Patient/family education    PT Goals (Current goals can be found in the Care Plan section)  Acute Rehab PT Goals Patient Stated Goal: hopes to go to an ALF eventually, daughter is looking into this PT Goal Formulation: With patient Time For Goal Achievement: 08/02/23 Potential to Achieve Goals: Good    Frequency Min 1X/week     Co-evaluation               AM-PAC PT "6 Clicks" Mobility  Outcome Measure Help needed turning from your back to your side while in a flat bed without using bedrails?: None Help needed moving from lying on your back to sitting on the side of a flat bed without using bedrails?: None Help needed moving to and from a bed to a chair (including a wheelchair)?: None Help needed standing up from a chair using your arms (e.g., wheelchair or bedside chair)?: None Help needed to walk in hospital room?: None Help needed climbing 3-5 steps with a railing? : A Little 6 Click Score: 23    End of Session Equipment Utilized During Treatment: Gait belt Activity Tolerance:  Patient tolerated treatment well Patient left: in chair;with call bell/phone within reach Nurse Communication: Mobility status PT Visit Diagnosis: Difficulty in walking, not elsewhere classified (R26.2);Pain Pain - Right/Left:  (both) Pain - part of body: Knee    Time: 1010-1034 PT Time Calculation (min) (ACUTE ONLY): 24 min   Charges:   PT Evaluation $PT Eval Moderate Complexity: 1 Mod PT Treatments $Gait Training: 8-22 mins PT General Charges $$ ACUTE PT VISIT: 1 Visit         Tamala Ser PT 07/19/2023  Acute Rehabilitation Services  Office [phone]

## 2023-07-19 NOTE — TOC Transition Note (Signed)
Transition of Care [facility]) - Discharge Note   Patient Details  Name: Isaac Phelps MRN: [id number] Date of Birth: [birth year]  Transition [facility]) CM/SW Contact:  Larrie Kass, LCSW Phone Number: 07/19/2023, 11:49 AM   Clinical Narrative:    CSW met with the patient to discuss recommendations. Pt has agreed to use a walker and home health services. Pt has no preference for an agency but would like the CSW to speak with his daughter about the recommendations. CSW spoke with the pt's daughter, Larita Fife, about the recommendations for home health and a walker. She stated that she has no preferences for an agency either. CSW ordered a walker for the pt through [employer], and [facility] was able to accept the pt for home health physical therapy. Pt's daughter inquired about resources for assisted living facilities. CSW provided the pt's daughter with the contact information for [employer]. Pt's daughter will pick up the patient after she gets off work. No further TOC needs , TOC sign off.   Final next level of care: Home w Home Health Services Barriers to Discharge: Barriers Resolved   Patient Goals and CMS Choice Patient states their goals for this hospitalization and ongoing recovery are:: retun home with home health CMS Medicare.gov Compare Post Acute Care list provided to:: Patient Choice offered to / list presented to : Patient, Adult Children      Discharge Placement                       Discharge Plan and Services Additional resources added to the After Visit Summary for                    DME Agency: [employer] Date DME Agency Contacted: 07/19/23 Time DME Agency Contacted: [phone] Representative spoke with at DME Agency: Vaughan Basta HH Arranged: PT HH Agency: [facility] Date [facility] Agency Contacted: 07/19/23 Time HH Agency Contacted: [phone] Representative spoke with at [facility] Agency: Elnita Maxwell  Social Drivers of Health (SDOH)  Interventions SDOH Screenings   Food Insecurity: No Food Insecurity (07/17/2023)  Housing: Low Risk  (07/17/2023)  Transportation Needs: No Transportation Needs (07/17/2023)  Utilities: Not At Risk (07/17/2023)  Alcohol Screen: Low Risk  (11/30/2021)  Depression (PHQ2-9): Low Risk  (11/30/2021)  Financial Resource Strain: Low Risk  (11/30/2021)  Physical Activity: Inactive (11/30/2021)  Social Connections: Socially Isolated (07/17/2023)  Stress: No Stress Concern Present (11/30/2021)  Tobacco Use: High Risk (07/16/2023)     Readmission Risk Interventions     No data to display

## 2023-07-19 NOTE — Discharge Summary (Signed)
Physician Discharge Summary   MAYANK TEUSCHER UJW:[id number] DOB: [birth year] DOA: 07/16/2023  PCP: Shirline Frees, NP  Admit date: 07/16/2023 Discharge date:  07/19/2023  Admitted From: Home Disposition:  Home with HH Discharging physician: Lewie Chamber, MD Barriers to discharge: none  Recommendations at discharge: Patient knows to follow up with PCP for refills Follow up BP, adjust meds as necessary; amlodipine dose lowered to 5 mg at discharge  Home Health: PT Equipment: RW  Discharge Condition: stable CODE STATUS: Full  Diet recommendation:  Diet Orders (From admission, onward)     Start     Ordered   07/19/23 0000  Diet - low sodium heart healthy        07/19/23 1043   07/17/23 0940  Diet regular Room service appropriate? Yes; Fluid consistency: Thin  Diet effective now       Question Answer Comment  Room service appropriate? Yes   Fluid consistency: Thin      07/17/23 0940            Hospital Course: Isaac Phelps is a 89 y.o. male with medical history significant for treated rectal cancer, hypertension, BPH admitted to the hospital with hypertensive urgency.  Patient lives alone with the assistance of his daughter who takes him to appointments, etc.   He has been out of blood pressure medications for several weeks as he has not wanted to leave the house due to worsening swelling in his feet to go to his primary care. On workup in the ER he was found to have uncontrolled blood pressure as high as 219/92. He denied any chest pain or dyspnea at that time.  Troponins were mildly elevated. He was admitted for achieving blood pressure control.  Assessment and Plan: * Hypertensive crisis-resolved as of 07/18/2023 - Out of medications due to no further refills for multiple weeks.  Have discussed this with his daughter as well as he will need to follow-up with PCP -BP better controlled this morning after restarting medications - Repeat echo also obtained showing grade 1  diastolic dysfunction, moderate LVH.  EF 65 to 70%  Chronic diastolic CHF (congestive heart failure) (HCC) - no s/s exac; trop leak from uncontrolled HTN; very mild BNP elevation - repeat echo noted as above  - s/p trial of lasix; LE edema improved well  CKD (chronic kidney disease) stage 3, GFR 30-59 ml/min (HCC) - patient has history of CKD3b. Baseline creat ~ 1.8-2, eGFR~ 32   Essential hypertension - continue Coreg and hydralazine - lowered amlodipine dose at discharge   Impaired mobility - No OT needs - getting HHPT at discharge and RW - eventual plan for ALF per patient and daughter  Dyslipidemia - continue zocor    The patient's acute and chronic medical conditions were treated accordingly. On day of discharge, patient was felt deemed stable for discharge. Patient/family member advised to call PCP or come back to ER if needed.   Principal Diagnosis: Hypertensive crisis  Discharge Diagnoses: Active Hospital Problems   Diagnosis Date Noted   Chronic diastolic CHF (congestive heart failure) (HCC) 07/18/2023    Priority: 2.   CKD (chronic kidney disease) stage 3, GFR 30-59 ml/min (HCC) 02/09/2015    Priority: 3.   Essential hypertension 02/07/2007    Priority: 4.   Impaired mobility 07/19/2023    Priority: 5.   Dyslipidemia 05/26/2007    Resolved Hospital Problems   Diagnosis Date Noted Date Resolved   Hypertensive crisis 07/16/2023 07/18/2023    Priority:  1.     Discharge Instructions     Diet - low sodium heart healthy   Complete by: As directed    Increase activity slowly   Complete by: As directed       Allergies as of 07/19/2023   No Known Allergies      Medication List     TAKE these medications    acetaminophen 650 MG CR tablet Commonly known as: TYLENOL Take 1 tablet (650 mg total) by mouth daily as needed for pain.   amLODipine 5 MG tablet Commonly known as: NORVASC Take 1 tablet (5 mg total) by mouth daily. What changed:   medication strength how much to take   carvedilol 3.125 MG tablet Commonly known as: COREG Take 1 tablet (3.125 mg total) by mouth 2 (two) times daily with a meal.   ferrous sulfate 325 (65 FE) MG EC tablet Take 325 mg by mouth 2 (two) times daily with a meal.   hydrALAZINE 100 MG tablet Commonly known as: APRESOLINE Take 1 tablet (100 mg total) by mouth every 8 (eight) hours.   ibuprofen 200 MG tablet Commonly known as: ADVIL Take 200 mg by mouth every 6 (six) hours as needed.   polyvinyl alcohol 1.4 % ophthalmic solution Commonly known as: LIQUIFILM TEARS Place 1 drop into both eyes daily as needed for dry eyes.   simvastatin 40 MG tablet Commonly known as: ZOCOR TAKE 1 TABLET(40 MG) BY MOUTH DAILY               Durable Medical Equipment  (From admission, onward)           Start     Ordered   07/19/23 1041  For home use only DME Walker rolling  Once       Question Answer Comment  Walker: With 5 Inch Wheels   Patient needs a walker to treat with the following condition Difficulty in walking, not elsewhere classified      07/19/23 1041   07/19/23 1038  For home use only DME Walker rolling  Once       Question Answer Comment  Walker: With 5 Inch Wheels   Patient needs a walker to treat with the following condition Generalized muscle weakness      07/19/23 1038            Follow-up Information     Nafziger, Kandee Keen, NP. Schedule an appointment as soon as possible for a visit in 2 week(s).   Specialty: Family Medicine Contact information: [address] [place] [address] [phone]                No Known Allergies  Consultations:   Procedures:   Discharge Exam: BP (!) 150/58   Pulse 72   Temp 98.8 F (37.1 C) (Oral)   Resp 18   Ht 5\' 9"  (1.753 m)   Wt 82 kg   SpO2 98%   BMI 26.70 kg/m  Physical Exam Constitutional:      General: He is not in acute distress.    Appearance: Normal appearance.  HENT:      Head: Normocephalic and atraumatic.     Mouth/Throat:     Mouth: Mucous membranes are moist.  Eyes:     Extraocular Movements: Extraocular movements intact.  Cardiovascular:     Rate and Rhythm: Normal rate and regular rhythm.  Pulmonary:     Effort: Pulmonary effort is normal. No respiratory distress.     Breath sounds: Normal  breath sounds. No wheezing.  Abdominal:     General: Bowel sounds are normal. There is no distension.     Palpations: Abdomen is soft.     Tenderness: There is no abdominal tenderness.  Musculoskeletal:        General: Swelling (trace LE edema) present. Normal range of motion.     Cervical back: Normal range of motion and neck supple.  Skin:    General: Skin is warm and dry.  Neurological:     General: No focal deficit present.     Mental Status: He is alert.  Psychiatric:        Mood and Affect: Mood normal.        Behavior: Behavior normal.      The results of significant diagnostics from this hospitalization (including imaging, microbiology, ancillary and laboratory) are listed below for reference.   Microbiology: No results found for this or any previous visit (from the past 240 hours).   Labs: BNP (last 3 results) Recent Labs    07/16/23 1004  BNP 113.2*   Basic Metabolic Panel: Recent Labs  Lab 07/16/23 1004 07/17/23 1204 07/18/23 0417  NA 139 138 139  K 3.8 3.7 3.7  CL 105 107 107  CO2 25 23 23   GLUCOSE 94 138* 117*  BUN 30* 30* 30*  CREATININE 1.99* 1.73* 2.03*  CALCIUM 9.6 8.8* 8.6*   Liver Function Tests: No results for input(s): "AST", "ALT", "ALKPHOS", "BILITOT", "PROT", "ALBUMIN" in the last 168 hours. No results for input(s): "LIPASE", "AMYLASE" in the last 168 hours. No results for input(s): "AMMONIA" in the last 168 hours. CBC: Recent Labs  Lab 07/16/23 1004 07/18/23 0417  WBC 5.6 6.1  HGB 11.8* 10.6*  HCT 37.9* 33.8*  MCV 85.9 86.2  PLT 242 225   Cardiac Enzymes: No results for input(s): "CKTOTAL", "CKMB",  "CKMBINDEX", "TROPONINI" in the last 168 hours. BNP: Invalid input(s): "POCBNP" CBG: No results for input(s): "GLUCAP" in the last 168 hours. D-Dimer No results for input(s): "DDIMER" in the last 72 hours. Hgb A1c No results for input(s): "HGBA1C" in the last 72 hours. Lipid Profile No results for input(s): "CHOL", "HDL", "LDLCALC", "TRIG", "CHOLHDL", "LDLDIRECT" in the last 72 hours. Thyroid function studies No results for input(s): "TSH", "T4TOTAL", "T3FREE", "THYROIDAB" in the last 72 hours.  Invalid input(s): "FREET3" Anemia work up No results for input(s): "VITAMINB12", "FOLATE", "FERRITIN", "TIBC", "IRON", "RETICCTPCT" in the last 72 hours. Urinalysis    Component Value Date/Time   COLORURINE YELLOW 11/01/2021 1134   APPEARANCEUR CLEAR 11/01/2021 1134   LABSPEC 1.020 11/01/2021 1134   PHURINE 6.0 11/01/2021 1134   GLUCOSEU NEGATIVE 11/01/2021 1134   HGBUR NEGATIVE 11/01/2021 1134   BILIRUBINUR NEGATIVE 11/01/2021 1134   KETONESUR NEGATIVE 11/01/2021 1134   PROTEINUR 100 (A) 10/18/2020 1151   UROBILINOGEN 1.0 11/01/2021 1134   NITRITE NEGATIVE 11/01/2021 1134   LEUKOCYTESUR NEGATIVE 11/01/2021 1134   Sepsis Labs Recent Labs  Lab 07/16/23 1004 07/18/23 0417  WBC 5.6 6.1   Microbiology No results found for this or any previous visit (from the past 240 hours).  Procedures/Studies: ECHOCARDIOGRAM COMPLETE Result Date: 07/17/2023    ECHOCARDIOGRAM REPORT   Patient Name:   Isaac Phelps Date of Exam: 07/17/2023 Medical Rec #:  [id number]     Height:       69.0 in Accession #:    [id number]    Weight:       180.8 lb Date of Birth:  1930-07-28  BSA:          1.979 m Patient Age:    89 years      BP:           129/58 mmHg Patient Gender: M             HR:           71 bpm. Exam Location:  Inpatient Procedure: 2D Echo, Cardiac Doppler, Color Doppler and Strain Analysis (Both            Spectral and Color Flow Doppler were utilized during procedure). Indications:    CHF   History:        Patient has prior history of Echocardiogram examinations, most                 recent 07/30/2020. Stroke; Risk Factors:Hypertension and Current                 Smoker.  Sonographer:    Amy Chionchio Referring Phys: [id number] MIR M [facility] IMPRESSIONS  1. Left ventricular ejection fraction, by estimation, is 65 to 70%. Left ventricular ejection fraction by 2D MOD biplane is 66.2 %. The left ventricle has normal function. The left ventricle has no regional wall motion abnormalities. There is moderate left ventricular hypertrophy. Left ventricular diastolic parameters are consistent with Grade I diastolic dysfunction (impaired relaxation). The global longitudinal strain is indeterminate.  2. Right ventricular systolic function is normal. The right ventricular size is normal. There is normal pulmonary artery systolic pressure. The estimated right ventricular systolic pressure is 23.8 mmHg.  3. Left atrial size was moderately dilated.  4. Right atrial size was mildly dilated.  5. The mitral valve is grossly normal. Trivial mitral valve regurgitation.  6. The aortic valve is tricuspid. Aortic valve regurgitation is not visualized. Aortic valve sclerosis/calcification is present, without any evidence of aortic stenosis. Aortic valve mean gradient measures 8.0 mmHg.  7. Aortic dilatation noted. There is mild dilatation of the ascending aorta, measuring 41 mm.  8. The inferior vena cava is normal in size with greater than 50% respiratory variability, suggesting right atrial pressure of 3 mmHg. Comparison(s): Changes from prior study are noted. 07/30/2020: LVEF 60-65%. FINDINGS  Left Ventricle: Left ventricular ejection fraction, by estimation, is 65 to 70%. Left ventricular ejection fraction by 2D MOD biplane is 66.2 %. The left ventricle has normal function. The left ventricle has no regional wall motion abnormalities. Strain  was performed and the global longitudinal strain is indeterminate. The left  ventricular internal cavity size was normal in size. There is moderate left ventricular hypertrophy. Left ventricular diastolic parameters are consistent with Grade I diastolic dysfunction (impaired relaxation). Indeterminate filling pressures. Right Ventricle: The right ventricular size is normal. No increase in right ventricular wall thickness. Right ventricular systolic function is normal. There is normal pulmonary artery systolic pressure. The tricuspid regurgitant velocity is 2.28 m/s, and  with an assumed right atrial pressure of 3 mmHg, the estimated right ventricular systolic pressure is 23.8 mmHg. Left Atrium: Left atrial size was moderately dilated. Right Atrium: Right atrial size was mildly dilated. Pericardium: There is no evidence of pericardial effusion. Mitral Valve: The mitral valve is grossly normal. Trivial mitral valve regurgitation. MV peak gradient, 5.7 mmHg. The mean mitral valve gradient is 3.0 mmHg. Tricuspid Valve: The tricuspid valve is grossly normal. Tricuspid valve regurgitation is trivial. Aortic Valve: The aortic valve is tricuspid. Aortic valve regurgitation is not visualized. Aortic valve sclerosis/calcification is present, without any evidence  of aortic stenosis. Aortic valve mean gradient measures 8.0 mmHg. Aortic valve peak gradient measures 14.0 mmHg. Aortic valve area, by VTI measures 2.56 cm. Pulmonic Valve: The pulmonic valve was grossly normal. Pulmonic valve regurgitation is trivial. Aorta: Aortic dilatation noted. There is mild dilatation of the ascending aorta, measuring 41 mm. Venous: The inferior vena cava is normal in size with greater than 50% respiratory variability, suggesting right atrial pressure of 3 mmHg. IAS/Shunts: No atrial level shunt detected by color flow Doppler. Additional Comments: 3D imaging was not performed.  LEFT VENTRICLE PLAX 2D                        Biplane EF (MOD) LVIDd:         4.10 cm         LV Biplane EF:   Left LVIDs:         2.70 cm                           ventricular LV PW:         1.30 cm                          ejection LV IVS:        1.60 cm                          fraction by LVOT diam:     2.10 cm                          2D MOD LV SV:         83                               biplane is LV SV Index:   42                               66.2 %. LVOT Area:     3.46 cm                                Diastology                                LV e' medial:    4.79 cm/s LV Volumes (MOD)               LV E/e' medial:  17.7 LV vol d, MOD    102.0 ml      LV e' lateral:   6.20 cm/s A2C:                           LV E/e' lateral: 13.7 LV vol d, MOD    126.0 ml A4C: LV vol s, MOD    30.9 ml A2C: LV vol s, MOD    47.2 ml A4C: LV SV MOD A2C:   71.1 ml LV SV MOD A4C:   126.0 ml LV SV MOD BP:    75.2 ml RIGHT VENTRICLE  IVC RV Basal diam:  4.30 cm     IVC diam: 1.40 cm RV Mid diam:    3.30 cm RV S prime:     18.60 cm/s TAPSE (M-mode): 2.9 cm LEFT ATRIUM             Index        RIGHT ATRIUM           Index LA Vol (A2C):   99.2 ml 50.12 ml/m  RA Area:     24.10 cm LA Vol (A4C):   93.7 ml 47.34 ml/m  RA Volume:   72.40 ml  36.58 ml/m LA Biplane Vol: 96.5 ml 48.76 ml/m  AORTIC VALVE                     PULMONIC VALVE AV Area (Vmax):    2.63 cm      PV Vmax:          1.21 m/s AV Area (Vmean):   2.60 cm      PV Peak grad:     5.9 mmHg AV Area (VTI):     2.56 cm      PR End Diast Vel: 3.21 msec AV Vmax:           187.00 cm/s AV Vmean:          131.000 cm/s AV VTI:            0.326 m AV Peak Grad:      14.0 mmHg AV Mean Grad:      8.0 mmHg LVOT Vmax:         142.00 cm/s LVOT Vmean:        98.400 cm/s LVOT VTI:          0.241 m LVOT/AV VTI ratio: 0.74  AORTA Ao Root diam: 3.30 cm Ao Asc diam:  4.10 cm MITRAL VALVE                TRICUSPID VALVE MV Area (PHT): 2.28 cm     TR Peak grad:   20.8 mmHg MV Area VTI:   2.16 cm     TR Vmax:        228.00 cm/s MV Peak grad:  5.7 mmHg MV Mean grad:  3.0 mmHg     SHUNTS MV Vmax:       1.19 m/s      Systemic VTI:  0.24 m MV Vmean:      75.4 cm/s    Systemic Diam: 2.10 cm MV Decel Time: 333 msec MV E velocity: 84.80 cm/s MV A velocity: 107.00 cm/s MV E/A ratio:  0.79 Zoila Shutter MD Electronically signed by Zoila Shutter MD Signature Date/Time: 07/17/2023/3:27:10 PM    Final    DG Knee 2 Views Right Result Date: 07/16/2023 CLINICAL DATA:  Leg swelling EXAM: RIGHT KNEE - 1-2 VIEW COMPARISON:  None Available. FINDINGS: No evidence of fracture, dislocation, or joint effusion. Severe medial compartment osteoarthritis with complete joint space loss. Atherosclerotic vascular calcifications. Soft tissues are unremarkable. IMPRESSION: Severe medial compartment osteoarthritis of the right knee. No acute findings. Electronically Signed   By: Duanne Guess D.O.   On: 07/16/2023 14:40   DG Chest 2 View Result Date: 07/16/2023 CLINICAL DATA:  Chest pain.  Bilateral lower extremity swelling. EXAM: CHEST - 2 VIEW COMPARISON:  Chest radiograph dated July 30, 2020. PET-CT dated November 23, 2022. FINDINGS: Stable cardiomegaly. Aortic atherosclerosis. Central pulmonary vascular congestion. No overt pulmonary edema. No focal consolidation, pleural  effusion, or pneumothorax. The previously noted bilateral lung nodules, including the part solid nodule in the superior segment of the left lower lobe, are not well visualized on this exam and better evaluated on the prior PET-CT dated November 23, 2022. No acute osseous abnormality. IMPRESSION: 1. Stable cardiomegaly with central pulmonary vascular congestion. 2. The previously noted bilateral lung nodules, including the part solid nodule in the superior segment of the left lower lobe, are not well visualized on this exam and better evaluated on the prior PET-CT dated November 23, 2022. Electronically Signed   By: Hart Robinsons M.D.   On: 07/16/2023 12:16     Time coordinating discharge: Over 30 minutes    Lewie Chamber, MD  [facility] 07/19/2023, 2:09 PM

## 2023-07-19 NOTE — Assessment & Plan Note (Signed)
-   No OT needs - getting HHPT at discharge and RW - eventual plan for ALF per patient and daughter

## 2023-07-22 ENCOUNTER — Telehealth: Payer: Self-pay

## 2023-07-22 NOTE — Transitions of Care (Post Inpatient/ED Visit) (Unsigned)
   07/22/2023  Name: Isaac Phelps MRN: [id number] DOB: [birth year]  Today's TOC FU Call Status: Today's TOC FU Call Status:: Unsuccessful Call (1st Attempt) Unsuccessful Call (1st Attempt) Date: 07/22/23  Attempted to reach the patient regarding the most recent Inpatient/ED visit.  Follow Up Plan: Additional outreach attempts will be made to reach the patient to complete the Transitions of Care (Post Inpatient/ED visit) call.   Signature Karena Addison, LPN [facility] Nurse Health Advisor Direct Dial [phone]

## 2023-07-23 NOTE — Transitions of Care (Post Inpatient/ED Visit) (Unsigned)
   07/23/2023  Name: Isaac Phelps MRN: [id number] DOB: [birth year]  Today's TOC FU Call Status: Today's TOC FU Call Status:: Unsuccessful Call (2nd Attempt) Unsuccessful Call (1st Attempt) Date: 07/22/23 Unsuccessful Call (2nd Attempt) Date: 07/23/23  Attempted to reach the patient regarding the most recent Inpatient/ED visit.  Follow Up Plan: Additional outreach attempts will be made to reach the patient to complete the Transitions of Care (Post Inpatient/ED visit) call.   Signature Karena Addison, LPN [facility] Nurse Health Advisor Direct Dial [phone]

## 2023-07-24 NOTE — Transitions of Care (Post Inpatient/ED Visit) (Signed)
   07/24/2023  Name: Isaac Phelps MRN: [id number] DOB: [birth year]  Today's TOC FU Call Status: Today's TOC FU Call Status:: Unsuccessful Call (3rd Attempt) Unsuccessful Call (1st Attempt) Date: 07/22/23 Unsuccessful Call (2nd Attempt) Date: 07/23/23 Unsuccessful Call (3rd Attempt) Date: 07/24/23  Attempted to reach the patient regarding the most recent Inpatient/ED visit.  Follow Up Plan: No further outreach attempts will be made at this time. We have been unable to contact the patient.  Signature Karena Addison, LPN [facility] Nurse Health Advisor Direct Dial [phone]

## 2023-08-02 ENCOUNTER — Ambulatory Visit: Payer: Medicare Other | Admitting: Adult Health

## 2023-08-02 ENCOUNTER — Encounter: Payer: Self-pay | Admitting: Adult Health

## 2023-08-02 VITALS — BP 150/80 | HR 78 | Temp 98.0°F | Ht 69.0 in | Wt 185.0 lb

## 2023-08-02 DIAGNOSIS — E785 Hyperlipidemia, unspecified: Secondary | ICD-10-CM

## 2023-08-02 DIAGNOSIS — N1831 Chronic kidney disease, stage 3a: Secondary | ICD-10-CM | POA: Diagnosis not present

## 2023-08-02 DIAGNOSIS — R600 Localized edema: Secondary | ICD-10-CM

## 2023-08-02 DIAGNOSIS — I5032 Chronic diastolic (congestive) heart failure: Secondary | ICD-10-CM

## 2023-08-02 DIAGNOSIS — I10 Essential (primary) hypertension: Secondary | ICD-10-CM | POA: Diagnosis not present

## 2023-08-02 DIAGNOSIS — Z7409 Other reduced mobility: Secondary | ICD-10-CM

## 2023-08-02 DIAGNOSIS — I169 Hypertensive crisis, unspecified: Secondary | ICD-10-CM

## 2023-08-02 MED ORDER — POTASSIUM CHLORIDE CRYS ER 10 MEQ PO TBCR
10.0000 meq | EXTENDED_RELEASE_TABLET | Freq: Two times a day (BID) | ORAL | 0 refills | Status: DC
Start: 2023-08-02 — End: 2023-09-17

## 2023-08-02 MED ORDER — FUROSEMIDE 20 MG PO TABS: 20.0000 mg | ORAL_TABLET | Freq: Every day | ORAL | 0 refills | Status: DC

## 2023-08-02 NOTE — Progress Notes (Signed)
 Subjective:    Patient ID: Isaac Phelps, male    DOB: 1930/07/16, 89 y.o.   MRN: [id number]  HPI 89 year old male who  has a past medical history of Adenocarcinoma of rectum (HCC), Anxiety, BENIGN PROSTATIC HYPERTROPHY (04/01/2008), Coronary artery disease, GERD (gastroesophageal reflux disease), HYPERLIPIDEMIA (05/26/2007), HYPERTENSION (02/07/2007), Syncope and collapse, and TOBACCO ABUSE (04/01/2008).  He presents to the office today for TCM visit   Admit Date 07/16/2023 Discharge Date 07/19/2023  He presented to the emergency room with hypertensive urgency.  Per ER note he had been out of his blood pressure medications for several weeks as he has not wanted to leave the house due to worsening swelling in his feet to go to his primary care.  In the ER he was found to have uncontrolled blood pressure as high as 240/100  He denied any chest pain or dyspnea at this time.  His troponins were mildly elevated.  Hospital Course  Hypertensive Crisis  -BP better controlled during hospital admission after restarting medications.  Repeat echo also obtained showing grade 1 diastolic dysfunction, moderate LVH.  EF 65 to 70%  Chronic diastolic CHF -No signs or symptoms of exacerbation.  Troponin elevated from uncontrolled hypertension, very mild BNP elevation. -He had a trial of Lasix and his lower extremity edema improved.  CKD stage III -baseline creatinine 1.8-2.0, EGFR 32  Essential Hypertension  - Continued on Coreg and hydralazine - Norvasc lowered to 5 mg upon discharge  BP Readings from Last 3 Encounters:  08/02/23 (!) 150/80  07/19/23 (!) 169/65  07/16/23 (!) 240/100   Impaired Mobility  - No OT needs - Getting HHPT at discharge  - per family, eventual plan is for ALF  Dyslipidemia - Continue with Simvastatin   Today he presents to the office with his daughter.  Overall he is feeling good.  Has been working with home health at home.  They have been monitoring his blood  pressures with readings in the 150s to 180s systolic.  He has not had any headaches or blurred vision.  He does report that the lower extremity edema has returned.  He is trying to elevate his feet at rest but does not see much difference.  He is also happy to report that about a month ago he quit smoking and has started drinking more water and eating a lower sodium diet.   Review of Systems  Constitutional: Negative.   HENT: Negative.    Eyes: Negative.   Respiratory: Negative.  Negative for chest tightness and shortness of breath.   Cardiovascular:  Positive for leg swelling. Negative for chest pain and palpitations.  Gastrointestinal: Negative.   Endocrine: Negative.   Genitourinary: Negative.   Musculoskeletal: Negative.   Skin: Negative.   Allergic/Immunologic: Negative.   Neurological: Negative.   Hematological: Negative.   Psychiatric/Behavioral: Negative.    All other systems reviewed and are negative.  Past Medical History:  Diagnosis Date   Adenocarcinoma of rectum (HCC)    Anxiety    BENIGN PROSTATIC HYPERTROPHY 04/01/2008   Coronary artery disease    GERD (gastroesophageal reflux disease)    HYPERLIPIDEMIA 05/26/2007   HYPERTENSION 02/07/2007   Syncope and collapse    TOBACCO ABUSE 04/01/2008    Social History   Socioeconomic History   Marital status: Widowed    Spouse name: Not on file   Number of children: 1   Years of education: Not on file   Highest education level: Not on file  Occupational  History   Not on file  Tobacco Use   Smoking status: Every Day    Current packs/day: 0.30    Types: Cigarettes   Smokeless tobacco: Never  Vaping Use   Vaping status: Never Used  Substance and Sexual Activity   Alcohol use: No   Drug use: No   Sexual activity: Not on file  Other Topics Concern   Not on file  Social History Narrative   Not on file   Social Drivers of Health   Financial Resource Strain: Low Risk  (11/30/2021)   Overall Financial Resource  Strain (CARDIA)    Difficulty of Paying Living Expenses: Not hard at all  Food Insecurity: No Food Insecurity (07/17/2023)   Hunger Vital Sign    Worried About Running Out of Food in the Last Year: Never true    Ran Out of Food in the Last Year: Never true  Transportation Needs: No Transportation Needs (07/17/2023)   PRAPARE - Administrator, Civil Service (Medical): No    Lack of Transportation (Non-Medical): No  Physical Activity: Inactive (11/30/2021)   Exercise Vital Sign    Days of Exercise per Week: 0 days    Minutes of Exercise per Session: 0 min  Stress: No Stress Concern Present (11/30/2021)   [employer] of Occupational Health - Occupational Stress Questionnaire    Feeling of Stress : Not at all  Social Connections: Socially Isolated (07/17/2023)   Social Connection and Isolation Panel [NHANES]    Frequency of Communication with Friends and Family: Three times a week    Frequency of Social Gatherings with Friends and Family: More than three times a week    Attends Religious Services: Never    Database administrator or Organizations: No    Attends Banker Meetings: Never    Marital Status: Widowed  Intimate Partner Violence: Not At Risk (07/17/2023)   Humiliation, Afraid, Rape, and Kick questionnaire    Fear of Current or Ex-Partner: No    Emotionally Abused: No    Physically Abused: No    Sexually Abused: No    Past Surgical History:  Procedure Laterality Date   APPENDECTOMY     BIOPSY  08/03/2020   Procedure: BIOPSY;  Surgeon: Iva Boop, MD;  Location: [facility] ENDOSCOPY;  Service: Endoscopy;;   COLONOSCOPY WITH PROPOFOL N/A 08/03/2020   Procedure: COLONOSCOPY WITH PROPOFOL;  Surgeon: Iva Boop, MD;  Location: [facility] ENDOSCOPY;  Service: Endoscopy;  Laterality: N/A;   ESOPHAGOGASTRODUODENOSCOPY (EGD) WITH PROPOFOL N/A 08/03/2020   Procedure: ESOPHAGOGASTRODUODENOSCOPY (EGD) WITH PROPOFOL;  Surgeon: Iva Boop, MD;  Location: [facility] ENDOSCOPY;   Service: Endoscopy;  Laterality: N/A;   TONSILLECTOMY      Family History  Problem Relation Age of Onset   Arthritis Neg Hx        family   Hypertension Neg Hx        family   Stroke Neg Hx        family , 1st degree relative   Colon cancer Neg Hx    Colon polyps Neg Hx    Esophageal cancer Neg Hx    Rectal cancer Neg Hx    Stomach cancer Neg Hx     No Known Allergies  Current Outpatient Medications on File Prior to Visit  Medication Sig Dispense Refill   acetaminophen (TYLENOL) 650 MG CR tablet Take 1 tablet (650 mg total) by mouth daily as needed for pain.     amLODipine (NORVASC)  5 MG tablet Take 1 tablet (5 mg total) by mouth daily. 90 tablet 0   carvedilol (COREG) 3.125 MG tablet Take 1 tablet (3.125 mg total) by mouth 2 (two) times daily with a meal. 180 tablet 0   ferrous sulfate 325 (65 FE) MG EC tablet Take 325 mg by mouth 2 (two) times daily with a meal.     hydrALAZINE (APRESOLINE) 100 MG tablet Take 1 tablet (100 mg total) by mouth every 8 (eight) hours. 90 tablet 2   ibuprofen (ADVIL) 200 MG tablet Take 200 mg by mouth every 6 (six) hours as needed.     polyvinyl alcohol (LIQUIFILM TEARS) 1.4 % ophthalmic solution Place 1 drop into both eyes daily as needed for dry eyes.     simvastatin (ZOCOR) 40 MG tablet TAKE 1 TABLET(40 MG) BY MOUTH DAILY 90 tablet 1   No current facility-administered medications on file prior to visit.    BP (!) 150/80   Pulse 78   Temp 98 F (36.7 C) (Oral)   Ht 5\' 9"  (1.753 m)   Wt 185 lb (83.9 kg)   SpO2 98%   BMI 27.32 kg/m       Objective:   Physical Exam Vitals and nursing note reviewed.  Constitutional:      Appearance: Normal appearance.  Cardiovascular:     Rate and Rhythm: Normal rate and regular rhythm.     Pulses: Normal pulses.     Heart sounds: Normal heart sounds.  Pulmonary:     Effort: Pulmonary effort is normal.     Breath sounds: Normal breath sounds.  Musculoskeletal:     Right lower leg: 2+ Pitting  Edema present.     Left lower leg: 2+ Pitting Edema present.  Skin:    General: Skin is warm and dry.  Neurological:     General: No focal deficit present.     Mental Status: He is alert and oriented to person, place, and time.  Psychiatric:        Mood and Affect: Mood normal.        Behavior: Behavior normal.        Thought Content: Thought content normal.        Judgment: Judgment normal.        Assessment & Plan:  1. Hypertensive crisis (Primary) -Reviewed hospital notes, labs, imaging, medication changes and discharge instructions. All questions answered to the best of my ability.  - Has resolved  2. Chronic diastolic CHF (congestive heart failure) (HCC) - Continue to monitor  - Will start  on lasix for lower extremity edema   3. Stage 3a chronic kidney disease (HCC) - Will continue to monitor kidney function   4. Essential hypertension - Still elevated but improving  - Follow up in 2 weeks after starting lasix   5. Impaired mobility - Continue to work with PT   6. Dyslipidemia - Continue statin   7. Lower extremity edema  - furosemide (LASIX) 20 MG tablet; Take 1 tablet (20 mg total) by mouth daily.  Dispense: 30 tablet; Refill: 0 - potassium chloride (KLOR-CON M) 10 MEQ tablet; Take 1 tablet (10 mEq total) by mouth 2 (two) times daily.  Dispense: 30 tablet; Refill: 0  Shirline Frees, NP

## 2023-08-02 NOTE — Patient Instructions (Signed)
 I am going to send in some Lasix to help get the fluid off of your legs. I have also added potassium to take with the lasix  Follow up in 2 weeks

## 2023-08-13 ENCOUNTER — Ambulatory Visit: Payer: Medicare Other | Admitting: Adult Health

## 2023-08-14 ENCOUNTER — Telehealth: Payer: Self-pay

## 2023-08-14 NOTE — Telephone Encounter (Signed)
VO given to Vernona Rieger  ?

## 2023-08-14 NOTE — Telephone Encounter (Signed)
Okay for verbal orders? Please advise 

## 2023-08-14 NOTE — Telephone Encounter (Signed)
 Copied from CRM [id number]. Topic: Clinical - Home Health Verbal Orders >> Aug 14, 2023 10:54 AM Pascal Lux wrote: Caller/Agency: [facility] Callback Number: [phone] (can leave a message)  Service Requested: Skilled Nursing Frequency: nurse will call and provide their orders after evaluation  Any new concerns about the patient? Yes, Blood pressure high - reporting for today 180/86

## 2023-08-23 ENCOUNTER — Ambulatory Visit (INDEPENDENT_AMBULATORY_CARE_PROVIDER_SITE_OTHER): Admitting: Adult Health

## 2023-08-23 VITALS — BP 170/70 | HR 83 | Temp 98.1°F | Ht 69.0 in | Wt 185.0 lb

## 2023-08-23 DIAGNOSIS — I10 Essential (primary) hypertension: Secondary | ICD-10-CM

## 2023-08-23 DIAGNOSIS — R600 Localized edema: Secondary | ICD-10-CM

## 2023-08-23 LAB — BASIC METABOLIC PANEL
BUN: 38 mg/dL — ABNORMAL HIGH (ref 6–23)
CO2: 26 meq/L (ref 19–32)
Calcium: 9.1 mg/dL (ref 8.4–10.5)
Chloride: 103 meq/L (ref 96–112)
Creatinine, Ser: 2.52 mg/dL — ABNORMAL HIGH (ref 0.40–1.50)
GFR: 21.84 mL/min — ABNORMAL LOW (ref >60.00)
Glucose, Bld: 107 mg/dL — ABNORMAL HIGH (ref 70–99)
Potassium: 4.5 meq/L (ref 3.5–5.1)
Sodium: 138 meq/L (ref 135–145)

## 2023-08-23 MED ORDER — CARVEDILOL 6.25 MG PO TABS
6.2500 mg | ORAL_TABLET | Freq: Two times a day (BID) | ORAL | 0 refills | Status: DC
Start: 2023-08-23 — End: 2023-11-21

## 2023-08-23 MED ORDER — AMLODIPINE BESYLATE 10 MG PO TABS: 10.0000 mg | ORAL_TABLET | Freq: Every day | ORAL | 1 refills | Status: DC

## 2023-08-23 NOTE — Patient Instructions (Addendum)
 I am going to increase your Norvasc to 10 mg daily and increased Carvedilol to 6.25 mg twice daily.   We may increase your lasix once I see blood work   Please work on cutting back on salt   Please bring your blood pressure log with you to the next appointment

## 2023-08-23 NOTE — Progress Notes (Signed)
 Subjective:    Patient ID: Isaac Phelps, male    DOB: 1931-03-31, 89 y.o.   MRN: [id number]  HPI 89 year old male who  has a past medical history of Adenocarcinoma of rectum (HCC), Anxiety, BENIGN PROSTATIC HYPERTROPHY (04/01/2008), Coronary artery disease, GERD (gastroesophageal reflux disease), HYPERLIPIDEMIA (05/26/2007), HYPERTENSION (02/07/2007), Syncope and collapse, and TOBACCO ABUSE (04/01/2008).  He presents to the office today for follow up regarding hypertension with his daughter.  When he was last seen roughly 3 weeks ago it was for follow-up after being admitted for hypertensive crisis.  He had been out of his blood pressure medications for several weeks and had not wanted to leave the house due to worsening swelling.  In the ER he was found to have uncontrolled blood pressure as high as 240/100.  He was placed back on his regimen of Norvasc, Coreg, and hydralazine but Coreg was decreased to 5 mg upon discharge  When I saw him for follow-up he had been monitoring his blood pressures at home with readings in the 150s over 180 systolic.  He continued to have lower extremity edema and he was started on Lasix 20 mg daily   Today he reports that at home his blood pressures have still been elevated in the 160-200 range  He reports that he has run out of Coreg about a week ago. Family who is with him today is unsure of BP's before Coreg ran out but reports that they were still elevated. His lower extremity edema varies day to day but has improved overall. He does drink plenty of water but his diet is high in sodium as he eats out a lot.    Review of Systems See HPI   Past Medical History:  Diagnosis Date   Adenocarcinoma of rectum (HCC)    Anxiety    BENIGN PROSTATIC HYPERTROPHY 04/01/2008   Coronary artery disease    GERD (gastroesophageal reflux disease)    HYPERLIPIDEMIA 05/26/2007   HYPERTENSION 02/07/2007   Syncope and collapse    TOBACCO ABUSE 04/01/2008    Social History    Socioeconomic History   Marital status: Widowed    Spouse name: Not on file   Number of children: 1   Years of education: Not on file   Highest education level: Not on file  Occupational History   Not on file  Tobacco Use   Smoking status: Former    Current packs/day: 0.00    Types: Cigarettes    Quit date: 2025    Years since quitting: 0.2   Smokeless tobacco: Never  Vaping Use   Vaping status: Never Used  Substance and Sexual Activity   Alcohol use: No   Drug use: No   Sexual activity: Not on file  Other Topics Concern   Not on file  Social History Narrative   Not on file   Social Drivers of Health   Financial Resource Strain: Low Risk  (11/30/2021)   Overall Financial Resource Strain (CARDIA)    Difficulty of Paying Living Expenses: Not hard at all  Food Insecurity: No Food Insecurity (07/17/2023)   Hunger Vital Sign    Worried About Running Out of Food in the Last Year: Never true    Ran Out of Food in the Last Year: Never true  Transportation Needs: No Transportation Needs (07/17/2023)   PRAPARE - Administrator, Civil Service (Medical): No    Lack of Transportation (Non-Medical): No  Physical Activity: Inactive (11/30/2021)  Exercise Vital Sign    Days of Exercise per Week: 0 days    Minutes of Exercise per Session: 0 min  Stress: No Stress Concern Present (11/30/2021)   [employer] of Occupational Health - Occupational Stress Questionnaire    Feeling of Stress : Not at all  Social Connections: Socially Isolated (07/17/2023)   Social Connection and Isolation Panel [NHANES]    Frequency of Communication with Friends and Family: Three times a week    Frequency of Social Gatherings with Friends and Family: More than three times a week    Attends Religious Services: Never    Database administrator or Organizations: No    Attends Banker Meetings: Never    Marital Status: Widowed  Intimate Partner Violence: Not At Risk (07/17/2023)    Humiliation, Afraid, Rape, and Kick questionnaire    Fear of Current or Ex-Partner: No    Emotionally Abused: No    Physically Abused: No    Sexually Abused: No    Past Surgical History:  Procedure Laterality Date   APPENDECTOMY     BIOPSY  08/03/2020   Procedure: BIOPSY;  Surgeon: Iva Boop, MD;  Location: [facility] ENDOSCOPY;  Service: Endoscopy;;   COLONOSCOPY WITH PROPOFOL N/A 08/03/2020   Procedure: COLONOSCOPY WITH PROPOFOL;  Surgeon: Iva Boop, MD;  Location: [facility] ENDOSCOPY;  Service: Endoscopy;  Laterality: N/A;   ESOPHAGOGASTRODUODENOSCOPY (EGD) WITH PROPOFOL N/A 08/03/2020   Procedure: ESOPHAGOGASTRODUODENOSCOPY (EGD) WITH PROPOFOL;  Surgeon: Iva Boop, MD;  Location: [facility] ENDOSCOPY;  Service: Endoscopy;  Laterality: N/A;   TONSILLECTOMY      Family History  Problem Relation Age of Onset   Arthritis Neg Hx        family   Hypertension Neg Hx        family   Stroke Neg Hx        family , 1st degree relative   Colon cancer Neg Hx    Colon polyps Neg Hx    Esophageal cancer Neg Hx    Rectal cancer Neg Hx    Stomach cancer Neg Hx     No Known Allergies  Current Outpatient Medications on File Prior to Visit  Medication Sig Dispense Refill   acetaminophen (TYLENOL) 650 MG CR tablet Take 1 tablet (650 mg total) by mouth daily as needed for pain.     amLODipine (NORVASC) 5 MG tablet Take 1 tablet (5 mg total) by mouth daily. 90 tablet 0   carvedilol (COREG) 3.125 MG tablet Take 1 tablet (3.125 mg total) by mouth 2 (two) times daily with a meal. 180 tablet 0   ferrous sulfate 325 (65 FE) MG EC tablet Take 325 mg by mouth 2 (two) times daily with a meal.     furosemide (LASIX) 20 MG tablet Take 1 tablet (20 mg total) by mouth daily. 30 tablet 0   hydrALAZINE (APRESOLINE) 100 MG tablet Take 1 tablet (100 mg total) by mouth every 8 (eight) hours. 90 tablet 2   ibuprofen (ADVIL) 200 MG tablet Take 200 mg by mouth every 6 (six) hours as needed.     polyvinyl alcohol  (LIQUIFILM TEARS) 1.4 % ophthalmic solution Place 1 drop into both eyes daily as needed for dry eyes.     potassium chloride (KLOR-CON M) 10 MEQ tablet Take 1 tablet (10 mEq total) by mouth 2 (two) times daily. 30 tablet 0   simvastatin (ZOCOR) 40 MG tablet TAKE 1 TABLET(40 MG) BY MOUTH DAILY 90 tablet  1   No current facility-administered medications on file prior to visit.    BP (!) 170/70   Pulse 83   Temp 98.1 F (36.7 C) (Oral)   Ht 5\' 9"  (1.753 m)   Wt 185 lb (83.9 kg)   SpO2 97%   BMI 27.32 kg/m       Objective:   Physical Exam Vitals and nursing note reviewed.  Constitutional:      Appearance: Normal appearance.  Cardiovascular:     Rate and Rhythm: Normal rate and regular rhythm.     Pulses: Normal pulses.     Heart sounds: Normal heart sounds.  Pulmonary:     Effort: Pulmonary effort is normal.     Breath sounds: Normal breath sounds.  Musculoskeletal:     Right lower leg: Edema present.     Left lower leg: Edema present.  Skin:    General: Skin is warm and dry.  Neurological:     General: No focal deficit present.     Mental Status: He is alert and oriented to person, place, and time.  Psychiatric:        Mood and Affect: Mood normal.        Behavior: Behavior normal.        Thought Content: Thought content normal.        Judgment: Judgment normal.        Assessment & Plan:   1. Essential hypertension (Primary) - Not at goal. Will increase Norvasc to 10 mg and increase Coreg to 6.25 mg BID.  - Needs to cut out sodium and we discussed simple dietary changes since he eats out quite often.  - Will have him follow up in 4 weeks  - Basic Metabolic Panel; Future - carvedilol (COREG) 6.25 MG tablet; Take 1 tablet (6.25 mg total) by mouth 2 (two) times daily with a meal.  Dispense: 180 tablet; Refill: 0 - amLODipine (NORVASC) 10 MG tablet; Take 1 tablet (10 mg total) by mouth daily.  Dispense: 90 tablet; Refill: 1  2. Lower extremity edema -  Improved but  not at goal  - Consider increase in lasix.  - needs to cut back on sodium  - Basic Metabolic Panel; Future  Time spent with patient today was 31 minutes which consisted of chart review, discussing  HTN, lower extremity edema and diet high in sodium, work up, treatment answering questions and documentation.

## 2023-09-02 ENCOUNTER — Other Ambulatory Visit: Payer: Self-pay | Admitting: Adult Health

## 2023-09-02 DIAGNOSIS — R600 Localized edema: Secondary | ICD-10-CM

## 2023-09-04 ENCOUNTER — Telehealth: Payer: Self-pay

## 2023-09-04 NOTE — Telephone Encounter (Signed)
 Please advise per your last note:   1. Essential hypertension (Primary) - Not at goal. Will increase Norvasc to 10 mg and increase Coreg to 6.25 mg BID.  - Needs to cut out sodium and we discussed simple dietary changes since he eats out quite often.  - Will have him follow up in 4 weeks

## 2023-09-04 NOTE — Telephone Encounter (Signed)
 Copied from CRM [id number]. Topic: Clinical - Medical Advice >> Sep 04, 2023 11:52 AM [place] J wrote: Reason for CRM: Renato Gails calling in from [facility] regarding patients frequent high blood pressure. Recent report of blood pressure is 190/80. Renato Gails was wondering if Kandee Keen would like to see the patient sometime before the 24th of this month.  Call back number: [phone]

## 2023-09-05 ENCOUNTER — Telehealth: Payer: Self-pay | Admitting: *Deleted

## 2023-09-05 ENCOUNTER — Other Ambulatory Visit: Payer: Self-pay | Admitting: Adult Health

## 2023-09-05 DIAGNOSIS — I10 Essential (primary) hypertension: Secondary | ICD-10-CM

## 2023-09-05 NOTE — Telephone Encounter (Signed)
 Copied from CRM [id number]. Topic: General - Other >> Sep 05, 2023 11:07 AM [place] E wrote: Reason for CRM: Patient's Physical therapy assistant, Everlean Alstrom, called in wanted to relayed to patient's PCP that his blood pressure from today was 174/82. Everlean Alstrom stated that the patient is not having any other symptoms regarding this, just wanted to make PCP aware. Callback number for Everlean Alstrom is [phone].

## 2023-09-05 NOTE — Telephone Encounter (Signed)
 Noted.

## 2023-09-05 NOTE — Telephone Encounter (Signed)
 Spoke to pt daughter Automotive engineer) lab results given as well as message below. Pt is okay with being referred out.    Does pt need to keep appt. For 4/24? Please advise

## 2023-09-05 NOTE — Telephone Encounter (Signed)
 Everlean Alstrom notified that pt has been referred to Hypertension clinic

## 2023-09-05 NOTE — Telephone Encounter (Signed)
 Isaac Phelps notified of f/u.

## 2023-09-13 ENCOUNTER — Telehealth: Payer: Self-pay

## 2023-09-13 NOTE — Telephone Encounter (Signed)
 Spoke to [employer] and she stated that PT visited pt today. Per Demetria the PT has pitting edema and elevated Bp. PT also noted that pt medication are not organized and she is not sure what pt need to be taking esp. For HTN. I advised that pt was referred to Lipid clinic but do not know the date. I called pt daughter due to pt not answering to see but no answer from her as well. Nurse is also advising a pill pack so that pt can take medication as needed.

## 2023-09-13 NOTE — Telephone Encounter (Signed)
 Called everyone on pt emergency contact still no answer.

## 2023-09-13 NOTE — Telephone Encounter (Signed)
 Spoke to Vernona Rieger and advised that pt is suppose to be seen by HTN clinic. However, pt has not answered the phone to schedule. As far as the edema, pt can f/u in office for evaluation. Called pt to schedule no answer for ov for the edema but no answer. Medication list has been faxed with confirmation to [facility].

## 2023-09-13 NOTE — Telephone Encounter (Signed)
 Copied from CRM [id number]. Topic: General - Other >> Sep 13, 2023 10:32 AM Izetta Dakin wrote: Reason for CRM: Vernona Rieger with [facility] calling to verify patient medications, Provided clinic fax # and informed her that this can not be completed over the phone. She also states that patient is being discharded from physical therapy, nursing will remain, patient's BP 186/86, patient not taking hydralazine, potassium, simvastatin.  Callback # [phone].

## 2023-09-13 NOTE — Telephone Encounter (Signed)
 Left message to return phone call.

## 2023-09-13 NOTE — Telephone Encounter (Signed)
 Unable to reach pt and daughter to advised to call the [facility] clinic back.

## 2023-09-16 ENCOUNTER — Other Ambulatory Visit: Payer: Self-pay | Admitting: Adult Health

## 2023-09-16 DIAGNOSIS — E785 Hyperlipidemia, unspecified: Secondary | ICD-10-CM

## 2023-09-16 DIAGNOSIS — R600 Localized edema: Secondary | ICD-10-CM

## 2023-09-16 DIAGNOSIS — I10 Essential (primary) hypertension: Secondary | ICD-10-CM

## 2023-09-17 ENCOUNTER — Telehealth: Payer: Self-pay | Admitting: *Deleted

## 2023-09-17 NOTE — Telephone Encounter (Signed)
 Copied from CRM [id number]. Topic: General - Other >> Sep 16, 2023  1:28 PM Dyann Glaser G wrote: Reason for CRM: Leigh from [facility] stated the patients BP is 192/80 today and it's still elevated. Forwarding to the clinic. She also stated they sent the refill for his meds today and that maybe why his BP maybe elevated.

## 2023-09-17 NOTE — Telephone Encounter (Signed)
 Noted.

## 2023-09-26 ENCOUNTER — Ambulatory Visit: Admitting: Adult Health

## 2023-09-26 ENCOUNTER — Encounter: Payer: Self-pay | Admitting: Adult Health

## 2023-09-26 VITALS — BP 170/84 | HR 73 | Temp 98.2°F | Ht 69.0 in | Wt 193.0 lb

## 2023-09-26 DIAGNOSIS — I10 Essential (primary) hypertension: Secondary | ICD-10-CM

## 2023-09-26 DIAGNOSIS — R600 Localized edema: Secondary | ICD-10-CM

## 2023-09-26 LAB — BASIC METABOLIC PANEL WITH GFR
BUN: 43 mg/dL — ABNORMAL HIGH (ref 6–23)
CO2: 24 meq/L (ref 19–32)
Calcium: 8.8 mg/dL (ref 8.4–10.5)
Chloride: 107 meq/L (ref 96–112)
Creatinine, Ser: 2 mg/dL — ABNORMAL HIGH (ref 0.40–1.50)
GFR: 28.81 mL/min — ABNORMAL LOW (ref >60.00)
Glucose, Bld: 89 mg/dL (ref 70–99)
Potassium: 4.3 meq/L (ref 3.5–5.1)
Sodium: 139 meq/L (ref 135–145)

## 2023-09-26 NOTE — Progress Notes (Signed)
 Subjective:    Patient ID: Isaac Phelps, male    DOB: 08-Aug-1930, 89 y.o.   MRN: [id number]  HPI 89 year old male who  has a past medical history of Adenocarcinoma of rectum (HCC), Anxiety, BENIGN PROSTATIC HYPERTROPHY (04/01/2008), Coronary artery disease, GERD (gastroesophageal reflux disease), HYPERLIPIDEMIA (05/26/2007), HYPERTENSION (02/07/2007), Syncope and collapse, and TOBACCO ABUSE (04/01/2008).  He presents to the office today for follow-up regarding hypertension.  He had a hospital admission in February 2025 for hypertensive crisis.  At this time  he had been out of his blood pressure medications for several weeks and had not wanted to leave the house due to worsening swelling.  In the ER he was found to have uncontrolled blood pressure as high as 240/100.  He was placed back on his regimen of Norvasc , Coreg , and hydralazine  but Norvasc  was decreased to 5 mg upon discharge.  When I initially saw him for follow-up after his hospital admission his blood pressures were not at goal and he continued to have lower extremity edema, he was started on Lasix  20 mg daily.  1 month ago Norvasc  was increased to 10 mg daily and Coreg  was increased to 6.25 mg twice daily.  Diet was high in sodium and he was advised to cut back on the amount of salt he intakes every day.  Today he is with his daughter.  He has been checking his blood pressure periodically at home with readings in the 150s to 190s over 70s to 90s.  His daughter reports that he is taking all of his medication except for hydralazine  which she has been out of, she was unaware that this was sent into the pharmacy earlier this month.  He was referred to the advanced hypertension clinic but they were not able to get in touch with him.  He continues to have lower extremity edema despite taking Lasix  20 mg daily.  He does report that he has cut out smoking and all fried foods and is eating more baked and broiled these days.  He is not staying as  hydrated as he should though.  He does not have any pain in his lower extremities while at rest or while walking.  Has not noticed any redness or warmth in his calfs. BMP done on 07/16/2023 mildly elevated at 113.2 and Chest xray at this time showed stable cardiomegaly with central pulmonary vascular congestion. Echo dated 07/17/2023 showed an EF of 65-70 %  Review of Systems See HPI   Past Medical History:  Diagnosis Date   Adenocarcinoma of rectum (HCC)    Anxiety    BENIGN PROSTATIC HYPERTROPHY 04/01/2008   Coronary artery disease    GERD (gastroesophageal reflux disease)    HYPERLIPIDEMIA 05/26/2007   HYPERTENSION 02/07/2007   Syncope and collapse    TOBACCO ABUSE 04/01/2008    Social History   Socioeconomic History   Marital status: Widowed    Spouse name: Not on file   Number of children: 1   Years of education: Not on file   Highest education level: Not on file  Occupational History   Not on file  Tobacco Use   Smoking status: Former    Current packs/day: 0.00    Types: Cigarettes    Quit date: 2025    Years since quitting: 0.3   Smokeless tobacco: Never  Vaping Use   Vaping status: Never Used  Substance and Sexual Activity   Alcohol  use: No   Drug use: No   Sexual  activity: Not on file  Other Topics Concern   Not on file  Social History Narrative   Not on file   Social Drivers of Health   Financial Resource Strain: Low Risk  (11/30/2021)   Overall Financial Resource Strain (CARDIA)    Difficulty of Paying Living Expenses: Not hard at all  Food Insecurity: No Food Insecurity (07/17/2023)   Hunger Vital Sign    Worried About Running Out of Food in the Last Year: Never true    Ran Out of Food in the Last Year: Never true  Transportation Needs: No Transportation Needs (07/17/2023)   PRAPARE - Administrator, Civil Service (Medical): No    Lack of Transportation (Non-Medical): No  Physical Activity: Inactive (11/30/2021)   Exercise Vital Sign    Days  of Exercise per Week: 0 days    Minutes of Exercise per Session: 0 min  Stress: No Stress Concern Present (11/30/2021)   [employer] of Occupational Health - Occupational Stress Questionnaire    Feeling of Stress : Not at all  Social Connections: Socially Isolated (07/17/2023)   Social Connection and Isolation Panel [NHANES]    Frequency of Communication with Friends and Family: Three times a week    Frequency of Social Gatherings with Friends and Family: More than three times a week    Attends Religious Services: Never    Database administrator or Organizations: No    Attends Banker Meetings: Never    Marital Status: Widowed  Intimate Partner Violence: Not At Risk (07/17/2023)   Humiliation, Afraid, Rape, and Kick questionnaire    Fear of Current or Ex-Partner: No    Emotionally Abused: No    Physically Abused: No    Sexually Abused: No    Past Surgical History:  Procedure Laterality Date   APPENDECTOMY     BIOPSY  08/03/2020   Procedure: BIOPSY;  Surgeon: Kenney Peacemaker, MD;  Location: [facility] ENDOSCOPY;  Service: Endoscopy;;   COLONOSCOPY WITH PROPOFOL  N/A 08/03/2020   Procedure: COLONOSCOPY WITH PROPOFOL ;  Surgeon: Kenney Peacemaker, MD;  Location: [facility] ENDOSCOPY;  Service: Endoscopy;  Laterality: N/A;   ESOPHAGOGASTRODUODENOSCOPY (EGD) WITH PROPOFOL  N/A 08/03/2020   Procedure: ESOPHAGOGASTRODUODENOSCOPY (EGD) WITH PROPOFOL ;  Surgeon: Kenney Peacemaker, MD;  Location: [facility] ENDOSCOPY;  Service: Endoscopy;  Laterality: N/A;   TONSILLECTOMY      Family History  Problem Relation Age of Onset   Arthritis Neg Hx        family   Hypertension Neg Hx        family   Stroke Neg Hx        family , 1st degree relative   Colon cancer Neg Hx    Colon polyps Neg Hx    Esophageal cancer Neg Hx    Rectal cancer Neg Hx    Stomach cancer Neg Hx     No Known Allergies  Current Outpatient Medications on File Prior to Visit  Medication Sig Dispense Refill   acetaminophen  (TYLENOL )  650 MG CR tablet Take 1 tablet (650 mg total) by mouth daily as needed for pain.     amLODipine  (NORVASC ) 10 MG tablet Take 1 tablet (10 mg total) by mouth daily. 90 tablet 1   carvedilol  (COREG ) 6.25 MG tablet Take 1 tablet (6.25 mg total) by mouth 2 (two) times daily with a meal. 180 tablet 0   ferrous sulfate 325 (65 FE) MG EC tablet Take 325 mg by mouth 2 (two) times  daily with a meal.     furosemide  (LASIX ) 20 MG tablet TAKE 1 TABLET BY MOUTH EVERY DAY 30 tablet 0   hydrALAZINE  (APRESOLINE ) 100 MG tablet TAKE 1 TABLET(100 MG) BY MOUTH EVERY 8 HOURS 270 tablet 0   ibuprofen (ADVIL) 200 MG tablet Take 200 mg by mouth every 6 (six) hours as needed.     polyvinyl alcohol  (LIQUIFILM TEARS) 1.4 % ophthalmic solution Place 1 drop into both eyes daily as needed for dry eyes.     potassium chloride  (KLOR-CON ) 10 MEQ tablet TAKE 1 TABLET BY MOUTH TWICE DAILY 30 tablet 0   simvastatin  (ZOCOR ) 40 MG tablet TAKE 1 TABLET(40 MG) BY MOUTH DAILY 90 tablet 1   No current facility-administered medications on file prior to visit.    BP (!) 170/84   Pulse 73   Temp 98.2 F (36.8 C) (Oral)   Ht 5\' 9"  (1.753 m)   Wt 193 lb (87.5 kg)   SpO2 96%   BMI 28.50 kg/m       Objective:   Physical Exam Vitals and nursing note reviewed.  Constitutional:      Appearance: Normal appearance.  Cardiovascular:     Rate and Rhythm: Normal rate and regular rhythm.     Pulses: Normal pulses.     Heart sounds: Normal heart sounds.     Comments: Pitting edema noted from foot to below the knee bilaterally.  No redness or warmth noted in calfs. Pulmonary:     Effort: Pulmonary effort is normal.     Breath sounds: Normal breath sounds.  Musculoskeletal:     Right lower leg: 2+ Pitting Edema present.     Left lower leg: 2+ Pitting Edema present.  Skin:    General: Skin is warm and dry.  Neurological:     General: No focal deficit present.     Mental Status: He is alert and oriented to person, place, and time.   Psychiatric:        Mood and Affect: Mood normal.        Behavior: Behavior normal.        Thought Content: Thought content normal.        Judgment: Judgment normal.        Assessment & Plan:  1. Essential hypertension (Primary) - Pick up and start hydralazine  today  - Phone number given to daughter to call HTN clinic for appointment - Continue to refrain from sodium  - Increase fluid  - Basic Metabolic Panel; Future - Basic Metabolic Panel  2. Lower extremity edema - Likely PVD. No apparent signs of DVT and this is not a new issue. Will do Venous US . Check lab work and see if we can increase lasix .  - elevate legs at  rest  - Basic Metabolic Panel; Future - Basic Metabolic Panel - VAS US  LOWER EXTREMITY VENOUS REFLUX; Future  Alto Atta, NP

## 2023-09-26 NOTE — Patient Instructions (Addendum)
 Please call the Advanced Hypertension Clinic at [facility] [phone]   I will follow up with you regarding your blood work

## 2023-10-02 ENCOUNTER — Other Ambulatory Visit: Payer: Self-pay

## 2023-10-02 MED ORDER — FUROSEMIDE 40 MG PO TABS: 40.0000 mg | ORAL_TABLET | Freq: Every day | ORAL | 0 refills | Status: DC

## 2023-11-18 ENCOUNTER — Emergency Department (HOSPITAL_BASED_OUTPATIENT_CLINIC_OR_DEPARTMENT_OTHER)

## 2023-11-18 ENCOUNTER — Encounter (HOSPITAL_BASED_OUTPATIENT_CLINIC_OR_DEPARTMENT_OTHER): Payer: Self-pay | Admitting: Emergency Medicine

## 2023-11-18 ENCOUNTER — Emergency Department (HOSPITAL_BASED_OUTPATIENT_CLINIC_OR_DEPARTMENT_OTHER): Admitting: Radiology

## 2023-11-18 ENCOUNTER — Other Ambulatory Visit: Payer: Self-pay

## 2023-11-18 ENCOUNTER — Emergency Department (HOSPITAL_BASED_OUTPATIENT_CLINIC_OR_DEPARTMENT_OTHER)
Admission: EM | Admit: 2023-11-18 | Discharge: 2023-11-18 | Disposition: A | Discharge status: Home / Self Care | Attending: Emergency Medicine | Admitting: Emergency Medicine

## 2023-11-18 DIAGNOSIS — Z8673 Personal history of transient ischemic attack (TIA), and cerebral infarction without residual deficits: Secondary | ICD-10-CM | POA: Diagnosis not present

## 2023-11-18 DIAGNOSIS — Z79899 Other long term (current) drug therapy: Secondary | ICD-10-CM | POA: Insufficient documentation

## 2023-11-18 DIAGNOSIS — M7989 Other specified soft tissue disorders: Secondary | ICD-10-CM | POA: Diagnosis present

## 2023-11-18 DIAGNOSIS — D649 Anemia, unspecified: Secondary | ICD-10-CM | POA: Insufficient documentation

## 2023-11-18 DIAGNOSIS — R600 Localized edema: Secondary | ICD-10-CM | POA: Insufficient documentation

## 2023-11-18 DIAGNOSIS — N189 Chronic kidney disease, unspecified: Secondary | ICD-10-CM | POA: Diagnosis not present

## 2023-11-18 DIAGNOSIS — I130 Hypertensive heart and chronic kidney disease with heart failure and stage 1 through stage 4 chronic kidney disease, or unspecified chronic kidney disease: Secondary | ICD-10-CM | POA: Diagnosis not present

## 2023-11-18 DIAGNOSIS — I5032 Chronic diastolic (congestive) heart failure: Secondary | ICD-10-CM | POA: Diagnosis not present

## 2023-11-18 DIAGNOSIS — I10 Essential (primary) hypertension: Secondary | ICD-10-CM

## 2023-11-18 LAB — CBC WITH DIFFERENTIAL/PLATELET
Abs Immature Granulocytes: 0.02 10*3/uL (ref 0.00–0.07)
Basophils Absolute: 0 10*3/uL (ref 0.0–0.1)
Basophils Relative: 1 %
Eosinophils Absolute: 0.1 10*3/uL (ref 0.0–0.5)
Eosinophils Relative: 2 %
HCT: 32.2 % — ABNORMAL LOW (ref 39.0–52.0)
Hemoglobin: 10.2 g/dL — ABNORMAL LOW (ref 13.0–17.0)
Immature Granulocytes: 0 %
Lymphocytes Relative: 8 %
Lymphs Abs: 0.5 10*3/uL — ABNORMAL LOW (ref 0.7–4.0)
MCH: 26.6 pg (ref 26.0–34.0)
MCHC: 31.7 g/dL (ref 30.0–36.0)
MCV: 83.9 fL (ref 80.0–100.0)
Monocytes Absolute: 0.5 10*3/uL (ref 0.1–1.0)
Monocytes Relative: 8 %
Neutro Abs: 5.5 10*3/uL (ref 1.7–7.7)
Neutrophils Relative %: 81 %
Platelets: 230 10*3/uL (ref 150–400)
RBC: 3.84 MIL/uL — ABNORMAL LOW (ref 4.22–5.81)
RDW: 15.2 % (ref 11.5–15.5)
WBC: 6.6 10*3/uL (ref 4.0–10.5)
nRBC: 0 % (ref 0.0–0.2)

## 2023-11-18 LAB — COMPREHENSIVE METABOLIC PANEL WITH GFR
ALT: 12 U/L (ref 0–44)
AST: 40 U/L (ref 15–41)
Albumin: 3.9 g/dL (ref 3.5–5.0)
Alkaline Phosphatase: 98 U/L (ref 38–126)
Anion gap: 10 (ref 5–15)
BUN: 30 mg/dL — ABNORMAL HIGH (ref 8–23)
CO2: 22 mmol/L (ref 22–32)
Calcium: 9.2 mg/dL (ref 8.9–10.3)
Chloride: 107 mmol/L (ref 98–111)
Creatinine, Ser: 2.12 mg/dL — ABNORMAL HIGH (ref 0.61–1.24)
GFR, Estimated: 29 mL/min — ABNORMAL LOW (ref >60)
Glucose, Bld: 110 mg/dL — ABNORMAL HIGH (ref 70–99)
Potassium: 4.5 mmol/L (ref 3.5–5.1)
Sodium: 140 mmol/L (ref 135–145)
Total Bilirubin: 0.3 mg/dL (ref 0.0–1.2)
Total Protein: 7.9 g/dL (ref 6.5–8.1)

## 2023-11-18 LAB — MAGNESIUM: Magnesium: 2.1 mg/dL (ref 1.7–2.4)

## 2023-11-18 MED ORDER — HYDRALAZINE HCL 25 MG PO TABS
100.0000 mg | ORAL_TABLET | Freq: Once | ORAL | Status: AC
  Administered 2023-11-18: 100 mg via ORAL
  Filled 2023-11-18: qty 4

## 2023-11-18 MED ORDER — FUROSEMIDE 10 MG/ML IJ SOLN
40.0000 mg | Freq: Once | INTRAMUSCULAR | Status: AC
  Administered 2023-11-18: 40 mg via INTRAVENOUS
  Filled 2023-11-18: qty 4

## 2023-11-18 MED ORDER — AMLODIPINE BESYLATE 5 MG PO TABS
10.0000 mg | ORAL_TABLET | Freq: Once | ORAL | Status: AC
  Administered 2023-11-18: 10 mg via ORAL
  Filled 2023-11-18: qty 2

## 2023-11-18 NOTE — ED Provider Notes (Signed)
 [place] EMERGENCY DEPARTMENT AT [facility] Provider Note   CSN: [id number] Arrival date & time: 11/18/23  1002     Patient presents with: Leg Swelling   Isaac Phelps is a 89 y.o. male.   Patient with history of chronic kidney disease, hypertension, stroke, chronic diastolic CHF --presents to the emergency department today for evaluation of lower extremity swelling.  This has been a chronic problem for the patient.  Lasix  increased from 20 mg to 40 mg at the end of April.  Patient does not feel that this has helped much.  Patient has had increasing swelling causing increasing pain in his legs.  Denies shortness of breath or cough.  No fevers.  No redness or warmth.  It hurts to walk.  Has not taken his medications today.  Reports urinary output when he takes his fluid pill.       Prior to Admission medications   Medication Sig Start Date End Date Taking? Authorizing Provider  acetaminophen  (TYLENOL ) 650 MG CR tablet Take 1 tablet (650 mg total) by mouth daily as needed for pain. 07/19/23   Faith Homes, MD  amLODipine  (NORVASC ) 10 MG tablet Take 1 tablet (10 mg total) by mouth daily. 08/23/23   Nafziger, Randel Buss, NP  carvedilol  (COREG ) 6.25 MG tablet Take 1 tablet (6.25 mg total) by mouth 2 (two) times daily with a meal. 08/23/23 11/21/23  Nafziger, Randel Buss, NP  ferrous sulfate 325 (65 FE) MG EC tablet Take 325 mg by mouth 2 (two) times daily with a meal. 11/09/21   Sumner Ends, MD  furosemide  (LASIX ) 40 MG tablet Take 1 tablet (40 mg total) by mouth daily. 10/02/23   Nafziger, Randel Buss, NP  hydrALAZINE  (APRESOLINE ) 100 MG tablet TAKE 1 TABLET(100 MG) BY MOUTH EVERY 8 HOURS 09/17/23   Nafziger, Randel Buss, NP  ibuprofen (ADVIL) 200 MG tablet Take 200 mg by mouth every 6 (six) hours as needed.    [provider]  polyvinyl alcohol  (LIQUIFILM TEARS) 1.4 % ophthalmic solution Place 1 drop into both eyes daily as needed for dry eyes.    [provider]  potassium chloride   (KLOR-CON ) 10 MEQ tablet TAKE 1 TABLET BY MOUTH TWICE DAILY 09/17/23   Nafziger, Randel Buss, NP  simvastatin  (ZOCOR ) 40 MG tablet TAKE 1 TABLET(40 MG) BY MOUTH DAILY 09/17/23   Nafziger, Cory, NP    Allergies: Patient has no known allergies.    Review of Systems  Updated Vital Signs BP (!) 230/94 (BP Location: Left Arm)   Pulse 73   Temp 98.8 F (37.1 C)   Resp 18   Ht 5' 9 (1.753 m)   Wt 87.5 kg   SpO2 96%   BMI 28.49 kg/m   Physical Exam Vitals and nursing note reviewed.  Constitutional:      Appearance: He is well-developed. He is not diaphoretic.  HENT:     Head: Normocephalic and atraumatic.     Mouth/Throat:     Mouth: Mucous membranes are not dry.   Eyes:     Conjunctiva/sclera: Conjunctivae normal.   Neck:     Vascular: Normal carotid pulses. No carotid bruit or JVD.     Trachea: Trachea normal. No tracheal deviation.   Cardiovascular:     Rate and Rhythm: Normal rate and regular rhythm.     Pulses: No decreased pulses.          Radial pulses are 2+ on the right side and 2+ on the left side.  Heart sounds: Normal heart sounds, S1 normal and S2 normal. Heart sounds not distant. No murmur heard. Pulmonary:     Effort: Pulmonary effort is normal. No respiratory distress.     Breath sounds: Normal breath sounds. No wheezing.  Chest:     Chest wall: No tenderness.  Abdominal:     General: Bowel sounds are normal.     Palpations: Abdomen is soft.     Tenderness: There is no abdominal tenderness. There is no guarding or rebound.   Musculoskeletal:     Cervical back: Normal range of motion and neck supple. No muscular tenderness.     Right lower leg: Edema present.     Left lower leg: Edema present.     Comments: Symmetric lower extremity edema (approx 3+) without signs of cellulitis.  Patient with mild general tenderness of the foot and lower legs bilaterally.   Skin:    General: Skin is warm and dry.     Coloration: Skin is not pale.   Neurological:      Mental Status: He is alert. Mental status is at baseline.   Psychiatric:        Mood and Affect: Mood normal.    ED Course  Patient seen and examined. History obtained directly from patient and son at bedside.  Reviewed previous PCP notes.  Labs/EKG: Ordered CBC, CMP, magnesium.  Imaging: Ordered DVT study (patient had one ordered April, never had this done from what I can tell), chest x-ray to ensure no signs of pulmonary edema.  Medications/Fluids: Ordered: Oral antihypertensives including amlodipine  and hydralazine , dose of IV Lasix ..   Most recent vital signs reviewed and are as follows: BP (!) 230/94 (BP Location: Left Arm)   Pulse 73   Temp 98.8 F (37.1 C)   Resp 18   Ht 5' 9 (1.753 m)   Wt 87.5 kg   SpO2 96%   BMI 28.49 kg/m   Initial impression: Bilateral lower extremity edema, hypertension  2:37 PM Reassessment performed. Patient appears stable.   Patient with very good urinary output after dose of Lasix  here.  Labs personally reviewed and interpreted including: CBC with mild anemia hemoglobin 10.2 at baseline; CMP creatinine slightly elevated above baseline of 2-2.1, potassium is normal, BUN is at 30; magnesium normal.  Imaging results reviewed: DVT study was negative for DVT.  Reviewed pertinent lab work and imaging with patient at bedside. Questions answered.   Patient discussed and seen by Dr. Tamela Fake.  Most current vital signs reviewed and are as follows: BP (!) 171/83   Pulse 80   Temp 98.8 F (37.1 C)   Resp (!) 30   Ht 5' 9 (1.753 m)   Wt 87.5 kg   SpO2 97%   BMI 28.49 kg/m   Plan: Discharge to home.  Will have patient temporarily increase Lasix  from 40 mg to 60 mg (1-1/2 tablets) for the next 3 days.  Prescriptions written for: None  Other home care instructions discussed: Elevate legs, avoid high salt intake  ED return instructions discussed: Return with chest pain, shortness of breath, worsening pain in the legs, new or worsening  symptoms.  Follow-up instructions discussed: Patient encouraged to follow-up with their PCP in 7 days.   Encouraged cardiology follow-up, referral placed due to difficulty with fluid overload in setting of elevated blood pressure and chronic kidney disease.  May benefit from specialized heart failure care.   (all labs ordered are listed, but only abnormal results are displayed) Labs Reviewed  CBC WITH DIFFERENTIAL/PLATELET - Abnormal; Notable for the following components:      Result Value   RBC 3.84 (*)    Hemoglobin 10.2 (*)    HCT 32.2 (*)    Lymphs Abs 0.5 (*)    All other components within normal limits  COMPREHENSIVE METABOLIC PANEL WITH GFR - Abnormal; Notable for the following components:   Glucose, Bld 110 (*)    BUN 30 (*)    Creatinine, Ser 2.12 (*)    GFR, Estimated 29 (*)    All other components within normal limits  MAGNESIUM    EKG: EKG Interpretation Date/Time:  Monday November 18 2023 10:08:35 EDT Ventricular Rate:  71 PR Interval:  196 QRS Duration:  92 QT Interval:  384 QTC Calculation: 417 R Axis:   -22  Text Interpretation: Normal sinus rhythm Possible Left atrial enlargement Left ventricular hypertrophy ( R in aVL , Cornell product ) Cannot rule out Septal infarct (cited on or before 16-Jul-2023) Abnormal ECG When compared with ECG of 17-Jul-2023 11:12, T wave inversion no longer evident in Inferior leads T wave inversion no longer evident in Lateral leads Confirmed by Scarlette Currier ([id number]) on 11/18/2023 2:04:23 PM  Radiology: US  Venous Img Lower Bilateral Result Date: 11/18/2023 CLINICAL DATA:  Worsening leg swelling. EXAM: Bilateral LOWER EXTREMITY VENOUS DOPPLER ULTRASOUND TECHNIQUE: Gray-scale sonography with compression, as well as color and duplex ultrasound, were performed to evaluate the deep venous system(s) from the level of the common femoral vein through the popliteal and proximal calf veins. COMPARISON:  None Available. FINDINGS: VENOUS Normal  compressibility of the common femoral, superficial femoral, and popliteal veins, as well as the visualized calf veins. Visualized portions of profunda femoral vein and great saphenous vein unremarkable. No filling defects to suggest DVT on grayscale or color Doppler imaging. Doppler waveforms show normal direction of venous flow, normal respiratory plasticity and response to augmentation. Some limitation in evaluation of the calf veins due to overlapping soft tissue edema. OTHER None. Limitations: none IMPRESSION: No evidence of bilateral lower extremity DVT. Electronically Signed   By: Adrianna Horde M.D.   On: 11/18/2023 12:25   DG Chest Port 1 View Result Date: 11/18/2023 CLINICAL DATA:  Bilateral lower extremity edema.  Hypertension. EXAM: PORTABLE CHEST 1 VIEW COMPARISON:  07/16/2023. FINDINGS: Stable cardiomegaly. Aortic atherosclerosis. No focal consolidation, sizeable pleural effusion, or pneumothorax. Known bilateral lung nodules including a part solid nodule in the superior segment of the left lower lobe are not well visualized on this exam and better evaluated on the prior PET-CT dated 11/23/2022. No acute osseous abnormality. IMPRESSION: 1. No acute cardiopulmonary findings. 2. Stable cardiomegaly. 3. Known bilateral lung nodules including a part solid nodule in the superior segment of the left lower lobe are not well visualized on this exam and better evaluated on the prior PET-CT dated 11/23/2022. Electronically Signed   By: Mannie Seek M.D.   On: 11/18/2023 11:22     Procedures   Medications Ordered in the ED  amLODipine  (NORVASC ) tablet 10 mg (10 mg Oral Given 11/18/23 1112)  furosemide  (LASIX ) injection 40 mg (40 mg Intravenous Given 11/18/23 1114)  hydrALAZINE  (APRESOLINE ) tablet 100 mg (100 mg Oral Given 11/18/23 1113)                                    Medical Decision Making Amount and/or Complexity of Data Reviewed Labs: ordered. Radiology: ordered.  Risk  Prescription  drug management.   Patient presents today with worsening bilateral lower extremity edema.  This has been a chronic problem for the patient.  He does have a history of CKD and CHF.  He is mainly complaining of pain and stiffness with ambulation.  Symptoms are bilateral.  Lower extremities appear well-perfused.  DVT study today was performed and negative.  Patient's chronic kidney disease is stable.  Normal electrolytes.  Lasix  was increased from 20 to 40 mg about a month and a half ago.  Good diuresis today in the ED.  Will have him take 60 mg for the next 3 days to help with swelling.  Hypertensive on arrival without signs of acute endorgan damage.  Chest x-ray is clear.  Patient was given his home blood pressure medications with improvement in blood pressure.  He has upcoming follow-up with blood pressure clinic.  The patient's vital signs, pertinent lab work and imaging were reviewed and interpreted as discussed in the ED course. Hospitalization was considered for further testing, treatments, or serial exams/observation. However as patient is well-appearing, has a stable exam, and reassuring studies today, I do not feel that they warrant admission at this time. This plan was discussed with the patient who verbalizes agreement and comfort with this plan and seems reliable and able to return to the Emergency Department with worsening or changing symptoms.       Final diagnoses:  Bilateral lower extremity edema  Hypertension, unspecified type  Chronic kidney disease, unspecified CKD stage  Chronic diastolic CHF (congestive heart failure) [facility])    ED Discharge Orders          Ordered    Ambulatory referral to Cardiology       Comments: If you have not heard from the Cardiology office within the next 72 hours please call [phone].   11/18/23 1439               Lyna Sandhoff, PA-C 11/18/23 1444    Scarlette Currier, MD 11/19/23 1810

## 2023-11-18 NOTE — ED Notes (Signed)
 Reminded patient to call for assistance before getting up, urinal given to patient and call bell in reach

## 2023-11-18 NOTE — ED Triage Notes (Signed)
 Pt via pov from home with bilateral lower leg swelling. Pt states he has been seen for the same in the past. Pt has bp 230/94 in triage. States he has not taken his BP meds today. Pt alert & oriented, NAD noted.

## 2023-11-18 NOTE — ED Notes (Signed)
 Reviewed AVS/discharge instruction with patient. Time allotted for and all questions answered. Patient is agreeable for d/c and escorted to ed exit by staff.

## 2023-11-18 NOTE — Discharge Instructions (Signed)
 You were evaluated in the emergency department today for high blood pressure and lower extremity swelling.  Your lab work appears stable with stable kidney function, normal electrolytes.  The ultrasound of your legs does not show any signs of blood clot as a cause of your swelling.  Your chest x-ray looks clear without signs of fluid buildup in the lungs.  Your blood pressure was improved after we gave you your home medications.  You had good urinary output after a dose of IV Lasix  to help with the swelling in your legs.  We would like you to temporarily take 1-1/2 tablets of furosemide  for the next 3 days (Tuesday, Wednesday, an Thursday) to help with the swelling in your legs.  Please try to avoid salt in your diet during this time.  Continue taking your blood pressure medications as prescribed.  Please follow-up with your primary care doctor in the next 1 week.  Return with worsening pain, redness, fever.  Return if you develop shortness of breath or trouble lying flat.

## 2023-11-18 NOTE — ED Notes (Signed)
 Patient transported to Ultrasound

## 2023-11-18 NOTE — ED Notes (Signed)
 X-ray at bedside

## 2023-11-19 ENCOUNTER — Encounter (HOSPITAL_BASED_OUTPATIENT_CLINIC_OR_DEPARTMENT_OTHER): Payer: Self-pay

## 2023-11-19 ENCOUNTER — Telehealth: Payer: Self-pay | Admitting: *Deleted

## 2023-11-19 NOTE — Transitions of Care (Post Inpatient/ED Visit) (Signed)
   11/19/2023  Name: Isaac Phelps MRN: [id number] DOB: Oct 16, 1930  Today's TOC FU Call Status: Today's TOC FU Call Status:: Unsuccessful Call (1st Attempt) Unsuccessful Call (1st Attempt) Date: 11/19/23  Attempted to reach the patient regarding the most recent Inpatient/ED visit.  Follow Up Plan: Additional outreach attempts will be made to reach the patient to complete the Transitions of Care (Post Inpatient/ED visit) call.   Signature: Nicolina Barrios CMA

## 2023-11-20 ENCOUNTER — Telehealth: Payer: Self-pay

## 2023-11-20 NOTE — Transitions of Care (Post Inpatient/ED Visit) (Signed)
   11/20/2023  Name: Isaac Phelps MRN: [id number] DOB: Nov 08, 1930  Today's TOC FU Call Status: Today's TOC FU Call Status:: Unsuccessful Call (2nd Attempt) Unsuccessful Call (1st Attempt) Date: 11/18/23 Unsuccessful Call (2nd Attempt) Date: 11/20/23  Attempted to reach the patient regarding the most recent Inpatient/ED visit.  Follow Up Plan: Additional outreach attempts will be made to reach the patient to complete the Transitions of Care (Post Inpatient/ED visit) call.   Signature: Alano Blasco, CMA

## 2023-11-21 ENCOUNTER — Ambulatory Visit (HOSPITAL_BASED_OUTPATIENT_CLINIC_OR_DEPARTMENT_OTHER): Admitting: Family

## 2023-11-21 ENCOUNTER — Encounter (HOSPITAL_BASED_OUTPATIENT_CLINIC_OR_DEPARTMENT_OTHER): Payer: Self-pay | Admitting: Family

## 2023-11-21 VITALS — BP 162/78 | HR 62 | Ht 72.0 in | Wt 187.0 lb

## 2023-11-21 DIAGNOSIS — I6523 Occlusion and stenosis of bilateral carotid arteries: Secondary | ICD-10-CM | POA: Diagnosis not present

## 2023-11-21 DIAGNOSIS — I10 Essential (primary) hypertension: Secondary | ICD-10-CM | POA: Diagnosis not present

## 2023-11-21 DIAGNOSIS — R600 Localized edema: Secondary | ICD-10-CM

## 2023-11-21 MED ORDER — ISOSORBIDE MONONITRATE ER 30 MG PO TB24: 30.0000 mg | ORAL_TABLET | Freq: Every day | ORAL | 1 refills | Status: DC

## 2023-11-21 MED ORDER — NEBIVOLOL HCL 10 MG PO TABS: 10.0000 mg | ORAL_TABLET | Freq: Every day | ORAL | 1 refills | Status: DC

## 2023-11-21 NOTE — Progress Notes (Signed)
 Advanced [facility]pertension Clinic Initial Assessment:    Date:  11/21/2023   ID:  [name], DOB 1930/08/18, MRN [id number]  PCP:  Merna Huxley, NP  Cardiologist:  None  Nephrologist:  Referring MD: Merna Huxley, NP   CC: Hypertension  History of Present Illness:    Isaac Phelps is a 89 y.o. male with a hx of HTN, LE edema, carotid stenosis, HLD, ascending aorta dilation here to establish care in the Advanced [facility]pertension Clinic.   Carotid duplex 2022 bilateral 1-39% stenosis. Echo 07/2023 normal LVEF 65-70%, gr1dd, RV normal, normal PASP, moderate LAE, mild LAE, trivial MR, aortic valve sclerosis without stenosis, mild dilation ascending aorta 41mm.  Discussed the use of AI scribe software for clinical note transcription with the patient, who gave verbal consent to proceed.  History of Present Illness [name] is a 89 year old male with hypertension who presents for a hypertension clinic visit. He is accompanied by his daughter. Pleasant gentleman who enjoys watching sports. BP in clinic 162/78 in R arm and 150/80 in L arm.   He manages hypertension with amlodipine , carvedilol , and a diuretic, taken at 8 AM. He lacks a home blood pressure cuff. The diuretic increases urination but does not significantly reduce swelling. Swelling, particularly around the ankles, persists throughout the day despite elevating his feet. A recent ER visit resulted in Lasix  administration, which reduced fluid retention. He has a 90-day supply of Lasix , taking one and a half tablets for three days, with slight improvement in swelling. Compression socks have been used previously for swelling.  He experiences no chest pain, heart palpitations, or significant changes in breathing. Breathing is stable. He does not take potassium supplements and does not consult a nephrologist. He previously obtained medications from the [facility] but now uses [facility], paying out of pocket for prescriptions.     Previous  antihypertensives:   Past Medical History:  Diagnosis Date   Adenocarcinoma of rectum (HCC)    Anxiety    BENIGN PROSTATIC HYPERTROPHY 04/01/2008   Coronary artery disease    GERD (gastroesophageal reflux disease)    HYPERLIPIDEMIA 05/26/2007   HYPERTENSION 02/07/2007   Syncope and collapse    TOBACCO ABUSE 04/01/2008    Past Surgical History:  Procedure Laterality Date   APPENDECTOMY     BIOPSY  08/03/2020   Procedure: BIOPSY;  Surgeon: Avram Lupita BRAVO, MD;  Location: [facility] ENDOSCOPY;  Service: Endoscopy;;   COLONOSCOPY WITH PROPOFOL  N/A 08/03/2020   Procedure: COLONOSCOPY WITH PROPOFOL ;  Surgeon: Avram Lupita BRAVO, MD;  Location: [facility] ENDOSCOPY;  Service: Endoscopy;  Laterality: N/A;   ESOPHAGOGASTRODUODENOSCOPY (EGD) WITH PROPOFOL  N/A 08/03/2020   Procedure: ESOPHAGOGASTRODUODENOSCOPY (EGD) WITH PROPOFOL ;  Surgeon: Avram Lupita BRAVO, MD;  Location: [facility] ENDOSCOPY;  Service: Endoscopy;  Laterality: N/A;   TONSILLECTOMY      Current Medications: Current Meds  Medication Sig   acetaminophen  (TYLENOL ) 650 MG CR tablet Take 1 tablet (650 mg total) by mouth daily as needed for pain.   amLODipine  (NORVASC ) 10 MG tablet Take 1 tablet (10 mg total) by mouth daily.   carvedilol  (COREG ) 6.25 MG tablet Take 1 tablet (6.25 mg total) by mouth 2 (two) times daily with a meal.   ferrous sulfate 325 (65 FE) MG EC tablet Take 325 mg by mouth 2 (two) times daily with a meal.   furosemide  (LASIX ) 40 MG tablet Take 1 tablet (40 mg total) by mouth daily.   hydrALAZINE  (APRESOLINE ) 100 MG tablet TAKE 1 TABLET(100 MG)  BY MOUTH EVERY 8 HOURS   ibuprofen (ADVIL) 200 MG tablet Take 200 mg by mouth every 6 (six) hours as needed.   polyvinyl alcohol  (LIQUIFILM TEARS) 1.4 % ophthalmic solution Place 1 drop into both eyes daily as needed for dry eyes.   potassium chloride  (KLOR-CON ) 10 MEQ tablet TAKE 1 TABLET BY MOUTH TWICE DAILY (Patient taking differently: Take 10 mEq by mouth daily. Pt said pill is to large to swallow  takes time to dissolve.)   simvastatin  (ZOCOR ) 40 MG tablet TAKE 1 TABLET(40 MG) BY MOUTH DAILY     Allergies:   Patient has no known allergies.   Social History   Socioeconomic History   Marital status: Widowed    Spouse name: Not on file   Number of children: 1   Years of education: Not on file   Highest education level: Not on file  Occupational History   Not on file  Tobacco Use   Smoking status: Former    Current packs/day: 0.00    Types: Cigarettes    Quit date: 2025    Years since quitting: 0.4   Smokeless tobacco: Never  Vaping Use   Vaping status: Never Used  Substance and Sexual Activity   Alcohol  use: No   Drug use: No   Sexual activity: Not on file  Other Topics Concern   Not on file  Social History Narrative   Not on file   Social Drivers of Health   Financial Resource Strain: Low Risk  (11/21/2023)   Overall Financial Resource Strain (CARDIA)    Difficulty of Paying Living Expenses: Not hard at all  Food Insecurity: No Food Insecurity (11/21/2023)   Hunger Vital Sign    Worried About Running Out of Food in the Last Year: Never true    Ran Out of Food in the Last Year: Never true  Transportation Needs: No Transportation Needs (11/21/2023)   PRAPARE - Administrator, Civil Service (Medical): No    Lack of Transportation (Non-Medical): No  Physical Activity: Inactive (11/21/2023)   Exercise Vital Sign    Days of Exercise per Week: 0 days    Minutes of Exercise per Session: 0 min  Stress: No Stress Concern Present (11/21/2023)   [employer] of Occupational Health - Occupational Stress Questionnaire    Feeling of Stress: Not at all  Social Connections: Socially Isolated (11/21/2023)   Social Connection and Isolation Panel    Frequency of Communication with Friends and Family: More than three times a week    Frequency of Social Gatherings with Friends and Family: More than three times a week    Attends Religious Services: Never    Automotive engineer or Organizations: No    Attends Banker Meetings: Never    Marital Status: Widowed     Family History: The patient's family history is negative for Arthritis, Hypertension, Stroke, Colon cancer, Colon polyps, Esophageal cancer, Rectal cancer, and Stomach cancer.  ROS:   Please see the history of present illness.     All other systems reviewed and are negative.  EKGs/Labs/Other Studies Reviewed:         Recent Labs: 07/16/2023: B Natriuretic Peptide 113.2 11/18/2023: ALT 12; BUN 30; Creatinine, Ser 2.12; Hemoglobin 10.2; Magnesium 2.1; Platelets 230; Potassium 4.5; Sodium 140   Recent Lipid Panel    Component Value Date/Time   CHOL 187 08/01/2020 0128   TRIG 94 08/01/2020 0128   TRIG 79 03/22/2006 0959  HDL 34 (L) 08/01/2020 0128   CHOLHDL 5.5 08/01/2020 0128   VLDL 19 08/01/2020 0128   LDLCALC 134 (H) 08/01/2020 0128   LDLDIRECT 151.4 05/26/2007 0947    Physical Exam:   VS:  BP (!) 162/78 (BP Location: Right Arm, Patient Position: Sitting, Cuff Size: Normal) Comment: Left Arm 150/80  Pulse 62   Ht 6' (1.829 m)   Wt 187 lb (84.8 kg)   SpO2 98%   BMI 25.36 kg/m  , BMI Body mass index is 25.36 kg/m. GENERAL:  Well appearing HEENT: Pupils equal round and reactive, fundi not visualized, oral mucosa unremarkable NECK:  No jugular venous distention, waveform within normal limits, carotid upstroke brisk and symmetric, no bruits, no thyromegaly LYMPHATICS:  No cervical adenopathy LUNGS:  Clear to auscultation bilaterally HEART:  RRR.  PMI not displaced or sustained,S1 and S2 within normal limits, no S3, no S4, no clicks, no rubs, no murmurs ABD:  Flat, positive bowel sounds normal in frequency in pitch, no bruits, no rebound, no guarding, no midline pulsatile mass, no hepatomegaly, no splenomegaly EXT:  2 plus pulses throughout, no edema, no cyanosis no clubbing SKIN:  No rashes no nodules NEURO:  Cranial nerves II through XII grossly intact,  motor grossly intact throughout PSYCH:  Cognitively intact, oriented to person place and time   ASSESSMENT/PLAN:    Assessment & Plan Hypertension Current regimen inadequate. Preference for once-daily dosing.  - Switch hydralazine  to Imdur  (isosorbide  mononitrate) 30 mg once daily. Discussed headache side effect, advised Tylenol  if needed, report persistent headaches. Can further up-titrate at follow up. - Switch carvedilol  to nebivolol  10mg  daily for once-daily dosing. -Continue Amlodipine  10mg  daily (consider reducing dose at follow up due to LE edema), Lasix  40mg  daily.  - Compare prescription prices for cost-effective options. Cheaper at [facility] with GoodRx coupon (does not have prescription drug coverage).  Edema Persistent ankle edema with some improvement post-ER visit with increase diuretic 1.5 tabs Lasix  x 3 days then 1 tablet daily. - Continue Lasix  at 60 mg for three days, then return to 40 mg. Monitor for persistent swelling, adjust diuretic therapy if needed. - Encourage use of compression stockings during the day. - Advise keeping feet elevated to reduce swelling. -Prior echo with normal LVEF, gr1dd. Could consider spironolactone at follow up   Chronic kidney disease, unspecified Blood pressure management strategy aims to minimize renal impact. Potassium levels stable, no supplementation required. - Avoid blood pressure medications that negatively impact kidney function.  Carotid stenosis -update carotid duplex    Screening for Secondary Hypertension:     Relevant Labs/Studies:    Latest Ref Rng & Units 11/18/2023   10:56 AM 09/26/2023    9:12 AM 08/23/2023    9:31 AM  Basic Labs  Sodium 135 - 145 mmol/L 140  139  138   Potassium 3.5 - 5.1 mmol/L 4.5  4.3  4.5   Creatinine 0.61 - 1.24 mg/dL 7.87  7.99  7.47        Latest Ref Rng & Units 11/01/2021   11:34 AM 02/18/2017    9:00 AM  Thyroid    TSH 0.35 - 5.50 uIU/mL 2.20  1.74                     Disposition:    FU with MD/APP/PharmD in 4-6 weeks    Medication Adjustments/Labs and Tests Ordered: Current medicines are reviewed at length with the patient today.  Concerns regarding medicines are outlined above.  No  orders of the defined types were placed in this encounter.  No orders of the defined types were placed in this encounter.    Signed, Reche GORMAN Finder, NP  11/21/2023 9:15 AM    [facility]

## 2023-11-21 NOTE — Patient Instructions (Addendum)
 Medication Instructions:  STOP Carvedilol   STOP Hydralazine   START Nebivolol 10mg  daily  START Imdur 30mg  daily    Testing/Procedures: Your physician has requested that you have a carotid duplex. This test is an ultrasound of the carotid arteries in your neck. It looks at blood flow through these arteries that supply the brain with blood. Allow one hour for this exam. There are no restrictions or special instructions.    Follow-Up: Please follow up in 4-6 weeksin [facility] with Dr. Theodis Fiscal, Neomi Banks, NP or Donivan Furry PharmD    Special Instructions:    To prevent or reduce lower extremity swelling: Eat a low salt diet. Salt makes the body hold onto extra fluid which causes swelling. Sit with legs elevated. For example, in the recliner or on an ottoman.  Wear knee-high compression stockings during the daytime. Ones labeled 15-20 mmHg provide good compression.

## 2023-12-11 ENCOUNTER — Encounter (HOSPITAL_BASED_OUTPATIENT_CLINIC_OR_DEPARTMENT_OTHER): Payer: Self-pay | Admitting: Family

## 2023-12-19 ENCOUNTER — Encounter (HOSPITAL_BASED_OUTPATIENT_CLINIC_OR_DEPARTMENT_OTHER): Admitting: Family

## 2023-12-19 ENCOUNTER — Emergency Department (HOSPITAL_BASED_OUTPATIENT_CLINIC_OR_DEPARTMENT_OTHER)

## 2023-12-19 ENCOUNTER — Other Ambulatory Visit: Payer: Self-pay

## 2023-12-19 ENCOUNTER — Emergency Department (HOSPITAL_BASED_OUTPATIENT_CLINIC_OR_DEPARTMENT_OTHER)
Admission: EM | Admit: 2023-12-19 | Discharge: 2023-12-19 | Disposition: A | Discharge status: Home / Self Care | Attending: Emergency Medicine | Admitting: Emergency Medicine

## 2023-12-19 DIAGNOSIS — Z85048 Personal history of other malignant neoplasm of rectum, rectosigmoid junction, and anus: Secondary | ICD-10-CM | POA: Insufficient documentation

## 2023-12-19 DIAGNOSIS — I2510 Atherosclerotic heart disease of native coronary artery without angina pectoris: Secondary | ICD-10-CM | POA: Insufficient documentation

## 2023-12-19 DIAGNOSIS — Z79899 Other long term (current) drug therapy: Secondary | ICD-10-CM | POA: Insufficient documentation

## 2023-12-19 DIAGNOSIS — F17210 Nicotine dependence, cigarettes, uncomplicated: Secondary | ICD-10-CM | POA: Insufficient documentation

## 2023-12-19 DIAGNOSIS — I5032 Chronic diastolic (congestive) heart failure: Secondary | ICD-10-CM | POA: Insufficient documentation

## 2023-12-19 DIAGNOSIS — I110 Hypertensive heart disease with heart failure: Secondary | ICD-10-CM | POA: Insufficient documentation

## 2023-12-19 DIAGNOSIS — R600 Localized edema: Secondary | ICD-10-CM | POA: Diagnosis present

## 2023-12-19 LAB — CBC WITH DIFFERENTIAL/PLATELET
Abs Immature Granulocytes: 0.03 K/uL (ref 0.00–0.07)
Basophils Absolute: 0 K/uL (ref 0.0–0.1)
Basophils Relative: 1 %
Eosinophils Absolute: 0.1 K/uL (ref 0.0–0.5)
Eosinophils Relative: 2 %
HCT: 34.1 % — ABNORMAL LOW (ref 39.0–52.0)
Hemoglobin: 10.6 g/dL — ABNORMAL LOW (ref 13.0–17.0)
Immature Granulocytes: 1 %
Lymphocytes Relative: 10 %
Lymphs Abs: 0.7 K/uL (ref 0.7–4.0)
MCH: 26 pg (ref 26.0–34.0)
MCHC: 31.1 g/dL (ref 30.0–36.0)
MCV: 83.8 fL (ref 80.0–100.0)
Monocytes Absolute: 0.6 K/uL (ref 0.1–1.0)
Monocytes Relative: 9 %
Neutro Abs: 5.3 K/uL (ref 1.7–7.7)
Neutrophils Relative %: 77 %
Platelets: 257 K/uL (ref 150–400)
RBC: 4.07 MIL/uL — ABNORMAL LOW (ref 4.22–5.81)
RDW: 14.6 % (ref 11.5–15.5)
WBC: 6.7 K/uL (ref 4.0–10.5)
nRBC: 0 % (ref 0.0–0.2)

## 2023-12-19 LAB — BASIC METABOLIC PANEL WITH GFR
Anion gap: 12 (ref 5–15)
BUN: 39 mg/dL — ABNORMAL HIGH (ref 8–23)
CO2: 23 mmol/L (ref 22–32)
Calcium: 9.1 mg/dL (ref 8.9–10.3)
Chloride: 106 mmol/L (ref 98–111)
Creatinine, Ser: 2.4 mg/dL — ABNORMAL HIGH (ref 0.61–1.24)
GFR, Estimated: 25 mL/min — ABNORMAL LOW (ref >60)
Glucose, Bld: 100 mg/dL — ABNORMAL HIGH (ref 70–99)
Potassium: 3.9 mmol/L (ref 3.5–5.1)
Sodium: 141 mmol/L (ref 135–145)

## 2023-12-19 NOTE — ED Provider Notes (Addendum)
 [place] EMERGENCY DEPARTMENT AT [facility] Provider Note   CSN: [id number] Arrival date & time: 12/19/23  9083     Patient presents with: Leg Swelling   Isaac Phelps is a 89 y.o. male.   Patient with a known history of bilateral leg swelling.  Patient seen by cardiology June 19 seen by us  on June 16 has seen primary care doctor.  Known to have right-sided heart failure.  Is on Lasix  40 mg once a day.  Patient is still very bothered by the leg swelling.  And it is causing some skin changes.  Denies any shortness of breath.  Past medical history significant for hypertension tobacco abuse coronary artery disease adenocarcinoma of the rectum.  Patient's had his appendix removed patient is former smoker quit just this year.  Vital signs here temp 97.6 pulse 52 respiration 16 blood pressure 151/99 oxygen saturation 99% on room air.       Prior to Admission medications   Medication Sig Start Date End Date Taking? Authorizing Provider  acetaminophen  (TYLENOL ) 650 MG CR tablet Take 1 tablet (650 mg total) by mouth daily as needed for pain. 07/19/23   Patsy Lenis, MD  amLODipine  (NORVASC ) 10 MG tablet Take 1 tablet (10 mg total) by mouth daily. 08/23/23   Nafziger, Darleene, NP  ferrous sulfate 325 (65 FE) MG EC tablet Take 325 mg by mouth 2 (two) times daily with a meal. 11/09/21   Cloretta Arley NOVAK, MD  furosemide  (LASIX ) 40 MG tablet Take 1 tablet (40 mg total) by mouth daily. 10/02/23   Nafziger, Darleene, NP  ibuprofen (ADVIL) 200 MG tablet Take 200 mg by mouth every 6 (six) hours as needed.    [provider]  isosorbide  mononitrate (IMDUR ) 30 MG 24 hr tablet Take 1 tablet (30 mg total) by mouth daily. 11/21/23 02/19/24  Vannie Reche RAMAN, NP  nebivolol  (BYSTOLIC ) 10 MG tablet Take 1 tablet (10 mg total) by mouth daily. 11/21/23   Walker, Caitlin S, NP  polyvinyl alcohol  (LIQUIFILM TEARS) 1.4 % ophthalmic solution Place 1 drop into both eyes daily as needed for dry eyes.     [provider]  simvastatin  (ZOCOR ) 40 MG tablet TAKE 1 TABLET(40 MG) BY MOUTH DAILY 09/17/23   Nafziger, Cory, NP    Allergies: Patient has no known allergies.    Review of Systems  Constitutional:  Negative for chills and fever.  HENT:  Negative for ear pain and sore throat.   Eyes:  Negative for pain and visual disturbance.  Respiratory:  Negative for cough and shortness of breath.   Cardiovascular:  Positive for leg swelling. Negative for chest pain and palpitations.  Gastrointestinal:  Negative for abdominal pain and vomiting.  Genitourinary:  Negative for dysuria and hematuria.  Musculoskeletal:  Negative for arthralgias and back pain.  Skin:  Negative for color change and rash.  Neurological:  Negative for seizures and syncope.  All other systems reviewed and are negative.   Updated Vital Signs BP (!) 151/99 (BP Location: Right Arm)   Pulse (!) 52   Temp 97.6 F (36.4 C)   Resp 16   SpO2 99%   Physical Exam Vitals and nursing note reviewed.  Constitutional:      General: He is not in acute distress.    Appearance: Normal appearance. He is well-developed.  HENT:     Head: Normocephalic and atraumatic.  Eyes:     Extraocular Movements: Extraocular movements intact.     Conjunctiva/sclera: Conjunctivae  normal.     Pupils: Pupils are equal, round, and reactive to light.  Cardiovascular:     Rate and Rhythm: Normal rate and regular rhythm.     Heart sounds: No murmur heard. Pulmonary:     Effort: Pulmonary effort is normal. No respiratory distress.     Breath sounds: Rales present.     Comments: Some faint rales bilaterally. Abdominal:     Palpations: Abdomen is soft.     Tenderness: There is no abdominal tenderness.  Musculoskeletal:        General: No swelling.     Cervical back: Neck supple.     Right lower leg: Edema present.     Left lower leg: Edema present.     Comments: Bilateral leg swelling really from the knee down.  Chronic skin changes.   No open wounds.  Cap refill to both toes is good.  Skin:    General: Skin is warm and dry.     Capillary Refill: Capillary refill takes less than 2 seconds.  Neurological:     General: No focal deficit present.     Mental Status: He is alert and oriented to person, place, and time.  Psychiatric:        Mood and Affect: Mood normal.     (all labs ordered are listed, but only abnormal results are displayed) Labs Reviewed  CBC WITH DIFFERENTIAL/PLATELET  BASIC METABOLIC PANEL WITH GFR    EKG: None  Radiology: [facility] Chest Port 1 View Result Date: 12/19/2023 CLINICAL DATA:  Bilateral lower extremity edema. EXAM: PORTABLE CHEST 1 VIEW COMPARISON:  November 18, 2023. FINDINGS: Stable cardiomegaly. Both lungs are clear. The visualized skeletal structures are unremarkable. IMPRESSION: No active disease. Electronically Signed   By: Lynwood Landy Raddle M.D.   On: 12/19/2023 10:40     Procedures   Medications Ordered in the ED - No data to display                                  Medical Decision Making Amount and/or Complexity of Data Reviewed Labs: ordered. Radiology: ordered.   The leg swelling certainly is secondary to the right-sided heart failure.  Is followed by cardiology they saw him on June 19 they did document the bilateral lower extremity edema.  Chest x-ray here today shows no active disease.  Will see what electrolytes are like.  Maybe he can increase his Lasix  temporarily based on his renal function.  And follow-up closely with cardiology.  CBC white count 6.7 hemoglobin 10.6 platelets 257 basic metabolic panel sodium 141 potassium 3.9 glucose 100.  Patient's GFR is 25 with a creatinine of 2.4.  Chest x-ray had no evidence of any fluid.  Patient's GFR is very marginal.  Will check with where he has been at.  But this order prohibits any increase in Lasix .  Would recommend patient follow back up with cardiology and primary care doctor  Final diagnoses:  Chronic diastolic heart  failure (HCC)  Bilateral leg edema    ED Discharge Orders     None          Geraldene Hamilton, MD 12/19/23 1049    Geraldene Hamilton, MD 12/19/23 1228

## 2023-12-19 NOTE — ED Notes (Addendum)
 DC paperwork given and verbally understood... Provider aware of V/S and cleared for DC.[email or web address]

## 2023-12-19 NOTE — ED Triage Notes (Signed)
 Pt caox4 c/o increased lower leg edema and pain stating he was seen last month for the same but it has continued to get worse. Pt states he has been taking his diuretic as prescribed.

## 2023-12-19 NOTE — Discharge Instructions (Signed)
 Follow-up with either primary care doctor or cardiology whichever one can see you first in about a week.  To have your renal function rechecked.  You may be at a new baseline for your renal function.  Or could be slowly getting worse.  That is the reason for the recheck.  The leg swelling is secondary to the chronic fluid in the legs.  Which is secondary to your right-sided heart failure.

## 2023-12-20 ENCOUNTER — Encounter (HOSPITAL_COMMUNITY): Payer: Self-pay

## 2023-12-27 ENCOUNTER — Ambulatory Visit (HOSPITAL_COMMUNITY)
Admission: RE | Admit: 2023-12-27 | Discharge: 2023-12-27 | Disposition: A | Discharge status: Home / Self Care | Source: Ambulatory Visit | Attending: Adult Health | Admitting: Adult Health

## 2023-12-27 ENCOUNTER — Ambulatory Visit: Payer: Self-pay | Admitting: Adult Health

## 2023-12-27 DIAGNOSIS — R600 Localized edema: Secondary | ICD-10-CM

## 2024-01-01 ENCOUNTER — Encounter (HOSPITAL_BASED_OUTPATIENT_CLINIC_OR_DEPARTMENT_OTHER)

## 2024-01-13 ENCOUNTER — Other Ambulatory Visit: Payer: Self-pay

## 2024-01-13 ENCOUNTER — Emergency Department (HOSPITAL_BASED_OUTPATIENT_CLINIC_OR_DEPARTMENT_OTHER)

## 2024-01-13 ENCOUNTER — Encounter (HOSPITAL_BASED_OUTPATIENT_CLINIC_OR_DEPARTMENT_OTHER): Payer: Self-pay

## 2024-01-13 ENCOUNTER — Inpatient Hospital Stay (HOSPITAL_BASED_OUTPATIENT_CLINIC_OR_DEPARTMENT_OTHER)
Admission: EM | Admit: 2024-01-13 | Discharge: 2024-01-20 | DRG: 291 | Disposition: A | Discharge status: Skilled Nursing Facility | Attending: Internal Medicine | Admitting: Internal Medicine

## 2024-01-13 DIAGNOSIS — N179 Acute kidney failure, unspecified: Secondary | ICD-10-CM | POA: Diagnosis present

## 2024-01-13 DIAGNOSIS — Z7401 Bed confinement status: Secondary | ICD-10-CM | POA: Diagnosis not present

## 2024-01-13 DIAGNOSIS — R609 Edema, unspecified: Secondary | ICD-10-CM | POA: Diagnosis not present

## 2024-01-13 DIAGNOSIS — L97929 Non-pressure chronic ulcer of unspecified part of left lower leg with unspecified severity: Secondary | ICD-10-CM | POA: Diagnosis present

## 2024-01-13 DIAGNOSIS — Z602 Problems related to living alone: Secondary | ICD-10-CM | POA: Diagnosis present

## 2024-01-13 DIAGNOSIS — I10 Essential (primary) hypertension: Secondary | ICD-10-CM | POA: Diagnosis not present

## 2024-01-13 DIAGNOSIS — L97919 Non-pressure chronic ulcer of unspecified part of right lower leg with unspecified severity: Secondary | ICD-10-CM | POA: Diagnosis present

## 2024-01-13 DIAGNOSIS — L0390 Cellulitis, unspecified: Secondary | ICD-10-CM

## 2024-01-13 DIAGNOSIS — I5033 Acute on chronic diastolic (congestive) heart failure: Secondary | ICD-10-CM | POA: Diagnosis present

## 2024-01-13 DIAGNOSIS — T501X6A Underdosing of loop [high-ceiling] diuretics, initial encounter: Secondary | ICD-10-CM | POA: Diagnosis present

## 2024-01-13 DIAGNOSIS — Z7982 Long term (current) use of aspirin: Secondary | ICD-10-CM

## 2024-01-13 DIAGNOSIS — R600 Localized edema: Secondary | ICD-10-CM | POA: Diagnosis present

## 2024-01-13 DIAGNOSIS — I130 Hypertensive heart and chronic kidney disease with heart failure and stage 1 through stage 4 chronic kidney disease, or unspecified chronic kidney disease: Principal | ICD-10-CM | POA: Diagnosis present

## 2024-01-13 DIAGNOSIS — I872 Venous insufficiency (chronic) (peripheral): Secondary | ICD-10-CM | POA: Diagnosis present

## 2024-01-13 DIAGNOSIS — Z7409 Other reduced mobility: Secondary | ICD-10-CM | POA: Diagnosis present

## 2024-01-13 DIAGNOSIS — I160 Hypertensive urgency: Secondary | ICD-10-CM | POA: Diagnosis present

## 2024-01-13 DIAGNOSIS — Z79899 Other long term (current) drug therapy: Secondary | ICD-10-CM

## 2024-01-13 DIAGNOSIS — Z91128 Patient's intentional underdosing of medication regimen for other reason: Secondary | ICD-10-CM

## 2024-01-13 DIAGNOSIS — I2510 Atherosclerotic heart disease of native coronary artery without angina pectoris: Secondary | ICD-10-CM | POA: Diagnosis present

## 2024-01-13 DIAGNOSIS — E785 Hyperlipidemia, unspecified: Secondary | ICD-10-CM | POA: Diagnosis present

## 2024-01-13 DIAGNOSIS — N400 Enlarged prostate without lower urinary tract symptoms: Secondary | ICD-10-CM | POA: Diagnosis present

## 2024-01-13 DIAGNOSIS — N1832 Chronic kidney disease, stage 3b: Secondary | ICD-10-CM | POA: Diagnosis present

## 2024-01-13 DIAGNOSIS — Z91148 Patient's other noncompliance with medication regimen for other reason: Secondary | ICD-10-CM

## 2024-01-13 DIAGNOSIS — Z87891 Personal history of nicotine dependence: Secondary | ICD-10-CM

## 2024-01-13 DIAGNOSIS — I779 Disorder of arteries and arterioles, unspecified: Secondary | ICD-10-CM | POA: Diagnosis present

## 2024-01-13 DIAGNOSIS — K219 Gastro-esophageal reflux disease without esophagitis: Secondary | ICD-10-CM | POA: Diagnosis present

## 2024-01-13 DIAGNOSIS — Z85048 Personal history of other malignant neoplasm of rectum, rectosigmoid junction, and anus: Secondary | ICD-10-CM

## 2024-01-13 LAB — URINALYSIS, W/ REFLEX TO CULTURE (INFECTION SUSPECTED)
Bacteria, UA: NONE SEEN
Bilirubin Urine: NEGATIVE
Glucose, UA: NEGATIVE mg/dL
Ketones, ur: NEGATIVE mg/dL
Leukocytes,Ua: NEGATIVE
Nitrite: NEGATIVE
Protein, ur: 30 mg/dL — AB
Specific Gravity, Urine: 1.016 (ref 1.005–1.030)
pH: 5.5 (ref 5.0–8.0)

## 2024-01-13 LAB — COMPREHENSIVE METABOLIC PANEL WITH GFR
ALT: 10 U/L (ref 0–44)
AST: 36 U/L (ref 15–41)
Albumin: 3.8 g/dL (ref 3.5–5.0)
Alkaline Phosphatase: 117 U/L (ref 38–126)
Anion gap: 12 (ref 5–15)
BUN: 30 mg/dL — ABNORMAL HIGH (ref 8–23)
CO2: 22 mmol/L (ref 22–32)
Calcium: 9.3 mg/dL (ref 8.9–10.3)
Chloride: 107 mmol/L (ref 98–111)
Creatinine, Ser: 2.43 mg/dL — ABNORMAL HIGH (ref 0.61–1.24)
GFR, Estimated: 25 mL/min — ABNORMAL LOW (ref >60)
Glucose, Bld: 86 mg/dL (ref 70–99)
Potassium: 4 mmol/L (ref 3.5–5.1)
Sodium: 141 mmol/L (ref 135–145)
Total Bilirubin: 0.3 mg/dL (ref 0.0–1.2)
Total Protein: 8 g/dL (ref 6.5–8.1)

## 2024-01-13 LAB — MRSA NEXT GEN BY PCR, NASAL: MRSA by PCR Next Gen: NOT DETECTED

## 2024-01-13 LAB — CBC WITH DIFFERENTIAL/PLATELET
Abs Immature Granulocytes: 0.03 K/uL (ref 0.00–0.07)
Basophils Absolute: 0 K/uL (ref 0.0–0.1)
Basophils Relative: 1 %
Eosinophils Absolute: 0.1 K/uL (ref 0.0–0.5)
Eosinophils Relative: 2 %
HCT: 32.6 % — ABNORMAL LOW (ref 39.0–52.0)
Hemoglobin: 10.1 g/dL — ABNORMAL LOW (ref 13.0–17.0)
Immature Granulocytes: 0 %
Lymphocytes Relative: 8 %
Lymphs Abs: 0.5 K/uL — ABNORMAL LOW (ref 0.7–4.0)
MCH: 26.1 pg (ref 26.0–34.0)
MCHC: 31 g/dL (ref 30.0–36.0)
MCV: 84.2 fL (ref 80.0–100.0)
Monocytes Absolute: 0.6 K/uL (ref 0.1–1.0)
Monocytes Relative: 8 %
Neutro Abs: 5.6 K/uL (ref 1.7–7.7)
Neutrophils Relative %: 81 %
Platelets: 267 K/uL (ref 150–400)
RBC: 3.87 MIL/uL — ABNORMAL LOW (ref 4.22–5.81)
RDW: 14.9 % (ref 11.5–15.5)
WBC: 6.9 K/uL (ref 4.0–10.5)
nRBC: 0 % (ref 0.0–0.2)

## 2024-01-13 LAB — LACTIC ACID, PLASMA
Lactic Acid, Venous: 0.8 mmol/L (ref 0.5–1.9)
Lactic Acid, Venous: 1.1 mmol/L (ref 0.5–1.9)

## 2024-01-13 LAB — BRAIN NATRIURETIC PEPTIDE: B Natriuretic Peptide: 166.3 pg/mL — ABNORMAL HIGH (ref 0.0–100.0)

## 2024-01-13 MED ORDER — HYDRALAZINE HCL 20 MG/ML IJ SOLN
10.0000 mg | Freq: Once | INTRAMUSCULAR | Status: AC
  Administered 2024-01-13 (×2): 10 mg via INTRAVENOUS
  Filled 2024-01-13: qty 1

## 2024-01-13 MED ORDER — CEFAZOLIN SODIUM-DEXTROSE 2-4 GM/100ML-% IV SOLN
2.0000 g | Freq: Once | INTRAVENOUS | Status: AC
  Administered 2024-01-13 (×2): 2 g via INTRAVENOUS
  Filled 2024-01-13: qty 100

## 2024-01-13 MED ORDER — ACETAMINOPHEN 325 MG PO TABS
650.0000 mg | ORAL_TABLET | Freq: Four times a day (QID) | ORAL | Status: DC | PRN
  Administered 2024-01-15 – 2024-01-16 (×3): 650 mg via ORAL
  Filled 2024-01-13 (×2): qty 2

## 2024-01-13 MED ORDER — CEFAZOLIN SODIUM-DEXTROSE 2-4 GM/100ML-% IV SOLN: 2.0000 g | Freq: Two times a day (BID) | INTRAVENOUS | Status: DC

## 2024-01-13 MED ORDER — HYDRALAZINE HCL 20 MG/ML IJ SOLN: 10.0000 mg | INTRAMUSCULAR | Status: DC | PRN

## 2024-01-13 MED ORDER — HYDROMORPHONE HCL 1 MG/ML IJ SOLN
0.5000 mg | INTRAMUSCULAR | Status: DC | PRN
  Administered 2024-01-13 (×2): 0.5 mg via INTRAVENOUS
  Filled 2024-01-13 (×2): qty 1

## 2024-01-13 MED ORDER — ONDANSETRON HCL 4 MG/2ML IJ SOLN: 4.0000 mg | Freq: Four times a day (QID) | INTRAMUSCULAR | Status: DC | PRN

## 2024-01-13 MED ORDER — ISOSORBIDE MONONITRATE ER 60 MG PO TB24
30.0000 mg | ORAL_TABLET | Freq: Every day | ORAL | Status: DC
  Administered 2024-01-13 (×2): 30 mg via ORAL
  Filled 2024-01-13: qty 1

## 2024-01-13 MED ORDER — FUROSEMIDE 10 MG/ML IJ SOLN
40.0000 mg | Freq: Once | INTRAMUSCULAR | Status: AC
  Administered 2024-01-13 (×2): 40 mg via INTRAVENOUS
  Filled 2024-01-13: qty 4

## 2024-01-13 MED ORDER — LABETALOL HCL 5 MG/ML IV SOLN: 10.0000 mg | INTRAVENOUS | Status: DC | PRN

## 2024-01-13 MED ORDER — HEPARIN SODIUM (PORCINE) 5000 UNIT/ML IJ SOLN
5000.0000 [IU] | Freq: Three times a day (TID) | INTRAMUSCULAR | Status: DC
  Administered 2024-01-13 – 2024-01-20 (×24): 5000 [IU] via SUBCUTANEOUS
  Filled 2024-01-13 (×19): qty 1

## 2024-01-13 MED ORDER — AMLODIPINE BESYLATE 10 MG PO TABS
10.0000 mg | ORAL_TABLET | Freq: Every day | ORAL | Status: DC
  Administered 2024-01-13 – 2024-01-20 (×11): 10 mg via ORAL
  Filled 2024-01-13 (×8): qty 1

## 2024-01-13 MED ORDER — ACETAMINOPHEN 650 MG RE SUPP: 650.0000 mg | Freq: Four times a day (QID) | RECTAL | Status: DC | PRN

## 2024-01-13 MED ORDER — ONDANSETRON HCL 4 MG PO TABS
4.0000 mg | ORAL_TABLET | Freq: Four times a day (QID) | ORAL | Status: DC | PRN
Start: 2024-01-13 — End: 2024-01-20

## 2024-01-13 MED ORDER — ALBUTEROL SULFATE (2.5 MG/3ML) 0.083% IN NEBU: 2.5000 mg | INHALATION_SOLUTION | RESPIRATORY_TRACT | Status: DC | PRN

## 2024-01-13 MED ORDER — TRAZODONE HCL 50 MG PO TABS
25.0000 mg | ORAL_TABLET | Freq: Every evening | ORAL | Status: DC | PRN
  Administered 2024-01-16: 25 mg via ORAL
  Filled 2024-01-13: qty 1

## 2024-01-13 MED ORDER — OXYCODONE HCL 5 MG PO TABS
5.0000 mg | ORAL_TABLET | ORAL | Status: DC | PRN
  Administered 2024-01-13 – 2024-01-19 (×12): 5 mg via ORAL
  Filled 2024-01-13 (×9): qty 1

## 2024-01-13 NOTE — ED Provider Notes (Addendum)
 [place] EMERGENCY DEPARTMENT AT [facility] Provider Note   CSN: [id number] Arrival date & time: 01/13/24  1141     Patient presents with: Leg Swelling   Isaac Phelps is a 89 y.o. male.   89 year old male presents with bilateral lower extremity edema has been worse for the past 2 weeks.  He denies any fever or chills.  He has noted some increasing weeping in his legs.  States he has had chronic edema.  Was hospitalized for similar symptoms she states possibly 6 months ago had IV diuresis at that time.  According to the daughter at bedside, his swelling has gradually increased.  He denies any shortness of breath orthopnea.       Prior to Admission medications   Medication Sig Start Date End Date Taking? Authorizing Provider  acetaminophen  (TYLENOL ) 650 MG CR tablet Take 1 tablet (650 mg total) by mouth daily as needed for pain. 07/19/23   Patsy Lenis, MD  amLODipine  (NORVASC ) 10 MG tablet Take 1 tablet (10 mg total) by mouth daily. 08/23/23   Nafziger, Darleene, NP  ferrous sulfate 325 (65 FE) MG EC tablet Take 325 mg by mouth 2 (two) times daily with a meal. 11/09/21   Cloretta Arley NOVAK, MD  furosemide  (LASIX ) 40 MG tablet Take 1 tablet (40 mg total) by mouth daily. 10/02/23   Nafziger, Darleene, NP  ibuprofen (ADVIL) 200 MG tablet Take 200 mg by mouth every 6 (six) hours as needed.    [provider]  isosorbide  mononitrate (IMDUR ) 30 MG 24 hr tablet Take 1 tablet (30 mg total) by mouth daily. 11/21/23 02/19/24  Vannie Reche RAMAN, NP  nebivolol  (BYSTOLIC ) 10 MG tablet Take 1 tablet (10 mg total) by mouth daily. 11/21/23   Walker, Caitlin S, NP  polyvinyl alcohol  (LIQUIFILM TEARS) 1.4 % ophthalmic solution Place 1 drop into both eyes daily as needed for dry eyes.    [provider]  simvastatin  (ZOCOR ) 40 MG tablet TAKE 1 TABLET(40 MG) BY MOUTH DAILY 09/17/23   Nafziger, Cory, NP    Allergies: Patient has no known allergies.    Review of Systems  All other systems  reviewed and are negative.   Updated Vital Signs BP (!) 238/76 (BP Location: Left Arm)   Pulse (!) 53   Temp 99.6 F (37.6 C) (Oral)   Resp 16   SpO2 98%   Physical Exam Vitals and nursing note reviewed.  Constitutional:      General: He is not in acute distress.    Appearance: Normal appearance. He is well-developed. He is not toxic-appearing.  HENT:     Head: Normocephalic and atraumatic.  Eyes:     General: Lids are normal.     Conjunctiva/sclera: Conjunctivae normal.     Pupils: Pupils are equal, round, and reactive to light.  Neck:     Thyroid : No thyroid  mass.     Trachea: No tracheal deviation.  Cardiovascular:     Rate and Rhythm: Normal rate and regular rhythm.     Heart sounds: Normal heart sounds. No murmur heard.    No gallop.  Pulmonary:     Effort: Pulmonary effort is normal. No respiratory distress.     Breath sounds: Normal breath sounds. No stridor. No decreased breath sounds, wheezing, rhonchi or rales.  Abdominal:     General: There is no distension.     Palpations: Abdomen is soft.     Tenderness: There is no abdominal tenderness. There is no rebound.  Musculoskeletal:        General: No tenderness. Normal range of motion.     Cervical back: Normal range of motion and neck supple.     Comments: Dried skin noted with possible early cellulitis to bilateral lower extremities.  Lymphadenopathy:     Comments: 3+ bilateral lower extremity edema.  Skin:    General: Skin is warm and dry.     Findings: No abrasion or rash.  Neurological:     Mental Status: He is alert and oriented to person, place, and time. Mental status is at baseline.     GCS: GCS eye subscore is 4. GCS verbal subscore is 5. GCS motor subscore is 6.     Cranial Nerves: No cranial nerve deficit.     Sensory: No sensory deficit.     Motor: Motor function is intact.  Psychiatric:        Attention and Perception: Attention normal.        Speech: Speech normal.        Behavior: Behavior  normal.     (all labs ordered are listed, but only abnormal results are displayed) Labs Reviewed  COMPREHENSIVE METABOLIC PANEL WITH GFR - Abnormal; Notable for the following components:      Result Value   BUN 30 (*)    Creatinine, Ser 2.43 (*)    GFR, Estimated 25 (*)    All other components within normal limits  CBC WITH DIFFERENTIAL/PLATELET - Abnormal; Notable for the following components:   RBC 3.87 (*)    Hemoglobin 10.1 (*)    HCT 32.6 (*)    Lymphs Abs 0.5 (*)    All other components within normal limits  URINALYSIS, W/ REFLEX TO CULTURE (INFECTION SUSPECTED) - Abnormal; Notable for the following components:   Hgb urine dipstick TRACE (*)    Protein, ur 30 (*)    All other components within normal limits  LACTIC ACID, PLASMA  LACTIC ACID, PLASMA    EKG: None  Radiology: No results found.   Procedures   Medications Ordered in the ED - No data to display                                  Medical Decision Making Amount and/or Complexity of Data Reviewed Labs: ordered. Radiology: ordered.  Risk Prescription drug management. Decision regarding hospitalization.   Patient's labs significant for chronic kidney disease.  Patient has severe lower extremity edema at this time.  Possible early developing cellulitis.  Patient was started on Ancef  for this.  Patient's blood pressure noted and treated with hydralazine  10 mg IV push.  Patient also given Lasix  40 mg IV push. Patient will require IV diuresis due to his weeping legs.  Will consult hospitalist for admission  CRITICAL CARE Performed by: Curtistine ONEIDA Dawn Total critical care time: 45 minutes Critical care time was exclusive of separately billable procedures and treating other patients. Critical care was necessary to treat or prevent imminent or life-threatening deterioration. Critical care was time spent personally by me on the following activities: development of treatment plan with patient and/or surrogate  as well as nursing, discussions with consultants, evaluation of patient's response to treatment, examination of patient, obtaining history from patient or surrogate, ordering and performing treatments and interventions, ordering and review of laboratory studies, ordering and review of radiographic studies, pulse oximetry and re-evaluation of patient's condition.     Final diagnoses:  None  ED Discharge Orders     None          Dasie Faden, MD 01/13/24 1418    Dasie Faden, MD 01/13/24 1451

## 2024-01-13 NOTE — Progress Notes (Signed)
 PHARMACY NOTE:  ANTIMICROBIAL RENAL DOSAGE ADJUSTMENT  Current antimicrobial regimen includes a mismatch between antimicrobial dosage and estimated renal function.  As per policy approved by the Pharmacy & Therapeutics and Medical Executive Committees, the antimicrobial dosage will be adjusted accordingly.  Current antimicrobial dosage:  Ancef  1gm IV q8h  Indication: cellulitis  Renal Function:  Estimated Creatinine Clearance: 22.2 mL/min (A) (by C-G formula based on SCr of 2.43 mg/dL (H)). []      On intermittent HD, scheduled: []      On CRRT    Antimicrobial dosage has been changed to:  Ancef  2gm IV q12h  Additional comments:   Thank you for allowing pharmacy to be a part of this patient's care.  Rosaline Millet, [facility] 01/13/2024 4:37 PM

## 2024-01-13 NOTE — ED Notes (Signed)
 Report given to the Floor RN.

## 2024-01-13 NOTE — Plan of Care (Signed)
  Problem: Elimination: Goal: Will not experience complications related to urinary retention Outcome: Progressing   Problem: Health Behavior/Discharge Planning: Goal: Ability to manage health-related needs will improve Outcome: Not Progressing   Problem: Clinical Measurements: Goal: Cardiovascular complication will be avoided Outcome: Not Progressing

## 2024-01-13 NOTE — Progress Notes (Addendum)
 89 year old with past medical history significant for hypertension, lower extremity edema, carotid stenosis, presenting with worsening lower extremity edema, he has chronic CKD with creatinine range 1.9-2.4 .Evaluated in the ED; and Dr. Dasie requested admission for lower extremity edema, managed with diuresis and treatment for possible superimposed cellulitis.  Patient was found also to have significant elevated systolic blood pressure 200 range.  Per Dr. Dasie patient is asymptomatic.  Patient will receive IV hydralazine  and IV Lasix  in the ED. Accepted to Step down.

## 2024-01-13 NOTE — H&P (Signed)
 History and Physical  Isaac Phelps FMW:[id number] DOB: 07-25-1930 DOA: 01/13/2024  PCP: Merna Huxley, NP   Chief Complaint: Leg swelling  HPI: Isaac Phelps is a 89 y.o. male with medical history significant for poorly controlled hypertension, GERD, hyperlipidemia, grade 1 diastolic dysfunction being admitted to the hospital with complaints of lower extremity edema and found to have poorly controlled hypertension.  Notably, patient was admitted to the hospital in February with asymptomatic systolic blood pressure in the 220s after having run out of his blood pressure medications and not obtaining refills.  He has chronic lower extremity edema, something he also complained of at that time.  During that hospitalization, he was started on amlodipine , hydralazine  and Coreg .  He tells me that now he presented to the emergency department this morning because he has increasing weeping in his bilateral lower extremities which are soaking his shoes.  He denies any fevers or chills, denies any pain or tenderness in his lower extremities.  States that the amount of swelling in his legs is stable, not any worse in the last few weeks.  He denies any cough, orthopnea, shortness of breath.  He denies any chest pain, headache, blurry vision, confusion or any other concerns.  He denies nausea or vomiting.  Currently, he tells me that he takes his medications, he takes 2 blood pressure medications as well as a water pill.  However he is not sure what the names of those medications are.  On evaluation in the emergency department, there was some concern for cellulitis.  Patient was given a dose of IV antibiotics, IV hydralazine , IV Lasix  and admitted to the hospitalist service.  Since admission to the [facility] stepdown unit, he has produced about 800 cc of clear yellow urine.  Review of Systems: Please see HPI for pertinent positives and negatives. A complete 10 system review of systems are otherwise negative.  Past Medical  History:  Diagnosis Date   Adenocarcinoma of rectum (HCC)    Anxiety    BENIGN PROSTATIC HYPERTROPHY 04/01/2008   Coronary artery disease    GERD (gastroesophageal reflux disease)    HYPERLIPIDEMIA 05/26/2007   HYPERTENSION 02/07/2007   Syncope and collapse    TOBACCO ABUSE 04/01/2008   Past Surgical History:  Procedure Laterality Date   APPENDECTOMY     BIOPSY  08/03/2020   Procedure: BIOPSY;  Surgeon: Avram Lupita BRAVO, MD;  Location: [facility] ENDOSCOPY;  Service: Endoscopy;;   COLONOSCOPY WITH PROPOFOL  N/A 08/03/2020   Procedure: COLONOSCOPY WITH PROPOFOL ;  Surgeon: Avram Lupita BRAVO, MD;  Location: [facility] ENDOSCOPY;  Service: Endoscopy;  Laterality: N/A;   ESOPHAGOGASTRODUODENOSCOPY (EGD) WITH PROPOFOL  N/A 08/03/2020   Procedure: ESOPHAGOGASTRODUODENOSCOPY (EGD) WITH PROPOFOL ;  Surgeon: Avram Lupita BRAVO, MD;  Location: [facility] ENDOSCOPY;  Service: Endoscopy;  Laterality: N/A;   TONSILLECTOMY     Social History:  reports that he quit smoking about 7 months ago. His smoking use included cigarettes. He has never used smokeless tobacco. He reports that he does not drink alcohol  and does not use drugs.  No Known Allergies  Family History  Problem Relation Age of Onset   Arthritis Neg Hx        family   Hypertension Neg Hx        family   Stroke Neg Hx        family , 1st degree relative   Colon cancer Neg Hx    Colon polyps Neg Hx    Esophageal cancer Neg Hx    Rectal  cancer Neg Hx    Stomach cancer Neg Hx      Prior to Admission medications   Medication Sig Start Date End Date Taking? Authorizing Provider  aspirin  81 MG chewable tablet Chew 81 mg by mouth daily.   Yes [provider]  acetaminophen  (TYLENOL ) 650 MG CR tablet Take 1 tablet (650 mg total) by mouth daily as needed for pain. 07/19/23   Patsy Lenis, MD  amLODipine  (NORVASC ) 10 MG tablet Take 1 tablet (10 mg total) by mouth daily. 08/23/23   Nafziger, Darleene, NP  ferrous sulfate 325 (65 FE) MG EC tablet Take 325 mg by mouth 2  (two) times daily with a meal. 11/09/21   Cloretta Arley NOVAK, MD  furosemide  (LASIX ) 40 MG tablet Take 1 tablet (40 mg total) by mouth daily. 10/02/23   Nafziger, Darleene, NP  ibuprofen (ADVIL) 200 MG tablet Take 200 mg by mouth every 6 (six) hours as needed.    [provider]  isosorbide  mononitrate (IMDUR ) 30 MG 24 hr tablet Take 1 tablet (30 mg total) by mouth daily. 11/21/23 02/19/24  Vannie Reche RAMAN, NP  nebivolol  (BYSTOLIC ) 10 MG tablet Take 1 tablet (10 mg total) by mouth daily. 11/21/23   Walker, Caitlin S, NP  polyvinyl alcohol  (LIQUIFILM TEARS) 1.4 % ophthalmic solution Place 1 drop into both eyes daily as needed for dry eyes.    [provider]  simvastatin  (ZOCOR ) 40 MG tablet TAKE 1 TABLET(40 MG) BY MOUTH DAILY 09/17/23   Merna Darleene, NP    Physical Exam: BP (!) 226/116 (BP Location: Left Arm)   Pulse (!) 55   Temp 99.6 F (37.6 C) (Oral)   Resp (!) 21   Ht 6' (1.829 m)   Wt 85.3 kg   SpO2 98%   BMI 25.50 kg/m  General:  Alert, oriented, calm, in no acute distress, resting comfortably on room air. Cardiovascular: RRR, no murmurs or rubs, he has 3+ pitting edema in the bilateral lower extremities, his legs including his calves are nontender Respiratory: clear to auscultation bilaterally, no wheezes, no crackles  Abdomen: soft, nontender, nondistended, normal bowel tones heard  Skin: dry, no rashes  Musculoskeletal: no joint effusions, normal range of motion  Psychiatric: appropriate affect, normal speech  Neurologic: extraocular muscles intact, clear speech, moving all extremities with intact sensorium         Labs on Admission:  Basic Metabolic Panel: Recent Labs  Lab 01/13/24 1235  NA 141  K 4.0  CL 107  CO2 22  GLUCOSE 86  BUN 30*  CREATININE 2.43*  CALCIUM 9.3   Liver Function Tests: Recent Labs  Lab 01/13/24 1235  AST 36  ALT 10  ALKPHOS 117  BILITOT 0.3  PROT 8.0  ALBUMIN 3.8   No results for input(s): LIPASE, AMYLASE in the  last 168 hours. No results for input(s): AMMONIA in the last 168 hours. CBC: Recent Labs  Lab 01/13/24 1235  WBC 6.9  NEUTROABS 5.6  HGB 10.1*  HCT 32.6*  MCV 84.2  PLT 267   Cardiac Enzymes: No results for input(s): CKTOTAL, CKMB, CKMBINDEX, TROPONINI in the last 168 hours. BNP (last 3 results) Recent Labs    07/16/23 1004  BNP 113.2*    ProBNP (last 3 results) No results for input(s): PROBNP in the last 8760 hours.  CBG: No results for input(s): GLUCAP in the last 168 hours.  Radiological Exams on Admission: DG Chest Port 1 View Result Date: 01/13/2024 CLINICAL DATA:  Shortness  of breath. EXAM: PORTABLE CHEST 1 VIEW COMPARISON:  12/19/2023 FINDINGS: The cardio pericardial silhouette is enlarged. There is pulmonary vascular congestion. Diffuse interstitial opacity suggests edema. No dense focal airspace consolidation or substantial pleural effusion. No acute bony abnormality. IMPRESSION: Enlargement of the cardiopericardial silhouette with pulmonary vascular congestion and diffuse interstitial opacity suggesting edema. Electronically Signed   By: Camellia Candle M.D.   On: 01/13/2024 16:17   Assessment/Plan Isaac Phelps is a 89 y.o. male with medical history significant for poorly controlled hypertension, GERD, hyperlipidemia, grade 1 diastolic dysfunction being admitted to the hospital with complaints of lower extremity edema and found to have poorly controlled hypertension.  Hypertensive urgency-without chest pain, dizziness, headache, confusion or other concerning symptoms.  I suspect that he has chronically elevated blood pressure as he does have a history of medication noncompliance, was documented to have systolic blood pressure well over 200 when admitted February 2025.  He had minimal response to IV hydralazine , will need gradual blood pressure reduction. -Observation admission -Continue telemetry monitoring -Received a dose of IV Lasix  in the emergency  department -Continue home Imdur  and amlodipine  now -IV hydralazine  with goal SBP 160-180 today  Grade 1 diastolic dysfunction-patient with what he reports is stable bilateral lower extremity edema, no clear evidence of acute heart failure exacerbation currently he may have some chronic lymphedema -Heart healthy diet with fluid restriction -Check BNP -Continue diuretic as indicated, received a dose of IV Lasix  today  Concern for cellulitis-he was given a dose of Ancef  in the emergency department, however currently I see no erythema, he has no fever, no leukocytosis.  I do not think he has cellulitis.  Will discontinue antibiotics  Hyperlipidemia-continue home statin  Acute kidney injury superimposed on CKD stage III-baseline creatinine roughly 2.0, currently elevated possibly due to chronically uncontrolled hypertension.  DVT prophylaxis: Subcutaneous heparin     Code Status: Full Code  Consults called: None  Admission status: Observation  Time spent: 56 minutes  Jadelynn Boylan CHRISTELLA Gail MD [facility] Pager [phone]  If 7PM-7AM, please contact night-coverage [email or web address] Password [facility]  01/13/2024, 5:21 PM

## 2024-01-13 NOTE — ED Notes (Signed)
 Report given to [facility].

## 2024-01-13 NOTE — ED Notes (Signed)
 Kim with cl called for transport

## 2024-01-13 NOTE — ED Triage Notes (Signed)
 Pt caox4 c/o leg swelling, weeping edema, scabbing, and pain worsening x2 wks.

## 2024-01-14 DIAGNOSIS — I160 Hypertensive urgency: Secondary | ICD-10-CM | POA: Diagnosis not present

## 2024-01-14 DIAGNOSIS — I872 Venous insufficiency (chronic) (peripheral): Secondary | ICD-10-CM | POA: Diagnosis not present

## 2024-01-14 DIAGNOSIS — Z91148 Patient's other noncompliance with medication regimen for other reason: Secondary | ICD-10-CM

## 2024-01-14 LAB — CBC
HCT: 30 % — ABNORMAL LOW (ref 39.0–52.0)
Hemoglobin: 9 g/dL — ABNORMAL LOW (ref 13.0–17.0)
MCH: 25.6 pg — ABNORMAL LOW (ref 26.0–34.0)
MCHC: 30 g/dL (ref 30.0–36.0)
MCV: 85.2 fL (ref 80.0–100.0)
Platelets: 227 K/uL (ref 150–400)
RBC: 3.52 MIL/uL — ABNORMAL LOW (ref 4.22–5.81)
RDW: 15 % (ref 11.5–15.5)
WBC: 6.5 K/uL (ref 4.0–10.5)
nRBC: 0 % (ref 0.0–0.2)

## 2024-01-14 LAB — BASIC METABOLIC PANEL WITH GFR
Anion gap: 8 (ref 5–15)
BUN: 31 mg/dL — ABNORMAL HIGH (ref 8–23)
CO2: 22 mmol/L (ref 22–32)
Calcium: 8.1 mg/dL — ABNORMAL LOW (ref 8.9–10.3)
Chloride: 108 mmol/L (ref 98–111)
Creatinine, Ser: 2.3 mg/dL — ABNORMAL HIGH (ref 0.61–1.24)
GFR, Estimated: 26 mL/min — ABNORMAL LOW (ref >60)
Glucose, Bld: 124 mg/dL — ABNORMAL HIGH (ref 70–99)
Potassium: 4 mmol/L (ref 3.5–5.1)
Sodium: 138 mmol/L (ref 135–145)

## 2024-01-14 MED ORDER — HYDRALAZINE HCL 100 MG PO TABS: 100.0000 mg | ORAL_TABLET | Freq: Three times a day (TID) | ORAL | Status: DC

## 2024-01-14 MED ORDER — SIMVASTATIN 40 MG PO TABS
40.0000 mg | ORAL_TABLET | Freq: Every morning | ORAL | Status: DC
  Administered 2024-01-14 – 2024-01-20 (×9): 40 mg via ORAL
  Filled 2024-01-14 (×7): qty 1

## 2024-01-14 MED ORDER — NEBIVOLOL HCL 10 MG PO TABS: 10.0000 mg | ORAL_TABLET | Freq: Every day | ORAL | Status: DC

## 2024-01-14 MED ORDER — ASPIRIN 81 MG PO CHEW
81.0000 mg | CHEWABLE_TABLET | Freq: Every day | ORAL | Status: DC
  Administered 2024-01-14 – 2024-01-20 (×7): 81 mg via ORAL
  Filled 2024-01-14 (×6): qty 1

## 2024-01-14 MED ORDER — FUROSEMIDE 40 MG PO TABS: 40.0000 mg | ORAL_TABLET | Freq: Every day | ORAL | Status: DC

## 2024-01-14 MED ORDER — ISOSORBIDE MONONITRATE ER 60 MG PO TB24
60.0000 mg | ORAL_TABLET | Freq: Every day | ORAL | Status: DC
  Administered 2024-01-14 (×2): 60 mg via ORAL
  Filled 2024-01-14: qty 1

## 2024-01-14 MED ORDER — CHLORHEXIDINE GLUCONATE CLOTH 2 % EX PADS
6.0000 | MEDICATED_PAD | Freq: Every day | CUTANEOUS | Status: DC
  Administered 2024-01-16 – 2024-01-20 (×6): 6 via TOPICAL

## 2024-01-14 NOTE — Hospital Course (Addendum)
 Isaac Phelps is a 89 y.o. male with medical history significant for poorly controlled hypertension due to noncompliance, GERD, hyperlipidemia, grade 1 diastolic dysfunction being admitted to the hospital with complaints of lower extremity edema and found to have poorly controlled hypertension.    Notably, patient was admitted to the hospital in February with asymptomatic systolic blood pressure in the 220s after having run out of his blood pressure medications and not obtaining refills.  He has chronic lower extremity edema, something he also complained of at that time.   During that hospitalization, he was started on amlodipine , hydralazine  and Coreg .  He complained of worsening LE pain and swelling on this admission to the hospital.  He had a recent LE ultrasound that showed significant venus reflux/insufficiency and plans are for referral to vein specialist.   Collateral information obtained from his daughter who also helps with his meds confirms that he often refuses his BP meds at home, notably lasix  due to the increased urination.   He was admitted for further BP control and LE wound care.  Assessment/Plan   Hypertensive urgency - resolved  HTN -without chest pain, dizziness, headache, confusion or other concerning symptoms.   - etiology suspected due to noncompliance at home; collateral obtained from daughter, especially with lasix  - continue amlodipine  - increased imdur  further; so far good response   Physical deconditioning Fall - night of 8/13 patient lowered himself to the ground after having a dream.  Denied any trauma and recalled the events well - Evaluated by PT with recommendations for SNF; he is now agreeable with going  Venous insufficiency LE edema - patient adamantly refusing care at home with daughter and also avoiding bathing/cleaning due to pain - have instructed him, it's prudent to continue with wound care; may need to pre-medicate if necessary - evaluated by  wound care; follow wound care rec's - HHRN to be arranged if able in order to help continue care and inspection after discharge   Acute on chronic dCHF - likely in setting of uncontrolled HTN; also not fully compliant on lasix  at home - LE edema in setting of venous insufficiency and possibly exac - BNP likely appropriate in setting of underlying CKD but cannot rule out some CHF exac - start on IV lasix  and monitor response; BMP in am also  Acute kidney injury superimposed on CKD stage IIIb -baseline creatinine roughly 2, currently elevated possibly due to chronically uncontrolled hypertension.  Cellulitis ruled out - d/c abx   Hyperlipidemia-continue home statin

## 2024-01-14 NOTE — Plan of Care (Signed)
  Problem: Education: Goal: Knowledge of General Education information will improve Description: Including pain rating scale, medication(s)/side effects and non-pharmacologic comfort measures Outcome: Progressing   Problem: Clinical Measurements: Goal: Respiratory complications will improve Outcome: Progressing   Problem: Nutrition: Goal: Adequate nutrition will be maintained Outcome: Progressing   Problem: Elimination: Goal: Will not experience complications related to urinary retention Outcome: Progressing   

## 2024-01-14 NOTE — Consult Note (Signed)
 WOC Nurse Consult Note: patient with history of edema to B lower legs with prior admissions for same  Reason for Consult:B lower extremity/foot wounds   Wound type: full thickness likely r/t venous insufficiency to B lower legs; full thickness between 2nd and 3rd digits R foot  Pressure Injury POA: NA  Measurement: see nursing flowsheet  Wound bed:R lower leg dark necrotic tissue and heavy hyperkeratotic skin; left lower leg pink moist; 3rd digit R foot brown necrotic (appears dry) and L foot 3rd toe heavy callusing  Drainage (amount, consistency, odor) per nursing flowsheet  Periwound: heavy hyperkeratotic tissue noted to both lower legs  Dressing procedure/placement/frequency:  Cleanse B lower legs with Vashe wound cleanser Soila [id number]) do not rinse and allow to air dry. Apply Vashe moistened gauze/Kerlix to lower legs, cover with ABD pads and secure with dry Kerlix roll gauze beginning right above toes and ending above wounds/hyperkeratotic skin.  After 3 days of Vashe WTD soaks will switch to Xeroform gauze.   Paint in between 2nd and 3rd digits R foot and callus to Left foot 3rd digit with Betadine 2 times daily, may cover with silicone foam if desired or leave open to air.   Patient would benefit from ongoing management of legs by wound care center or vascular surgeon. Would benefit from ongoing management of calluses/wounds to toes by podiatry.   POC discussed with bedside nurse. WOC team will not follow. Re-consult if further needs arise.   Thank you,    Treniya Lobb MSN, RN-BC, CWOCN :

## 2024-01-14 NOTE — Care Management Obs Status (Signed)
 MEDICARE OBSERVATION STATUS NOTIFICATION   Patient Details  Name: Isaac Phelps MRN: [id number] Date of Birth: 1930-12-24   Medicare Observation Status Notification Given:  Yes    Jon ONEIDA Anon, RN 01/14/2024, 3:24 PM

## 2024-01-14 NOTE — Progress Notes (Signed)
 Progress Note    Isaac Phelps   FMW:[id number]  DOB: April 07, 1931  DOA: 01/13/2024     0 PCP: Merna Huxley, NP  Initial CC: LE swelling/pain and uncontrolled BP  Hospital Course: Isaac Phelps is a 89 y.o. male with medical history significant for poorly controlled hypertension due to noncompliance, GERD, hyperlipidemia, grade 1 diastolic dysfunction being admitted to the hospital with complaints of lower extremity edema and found to have poorly controlled hypertension.    Notably, patient was admitted to the hospital in February with asymptomatic systolic blood pressure in the 220s after having run out of his blood pressure medications and not obtaining refills.  He has chronic lower extremity edema, something he also complained of at that time.   During that hospitalization, he was started on amlodipine , hydralazine  and Coreg .  He complained of worsening LE pain and swelling on this admission to the hospital.  He had a recent LE ultrasound that showed significant venus reflux/insufficiency and plans are for referral to vein specialist.   Collateral information obtained from his daughter who also helps with his meds confirms that he often refuses his BP meds at home, notably lasix  due to the increased urination.   He was admitted for further BP control and LE wound care.  Assessment/Plan   Hypertensive urgency -without chest pain, dizziness, headache, confusion or other concerning symptoms.   - etiology suspected due to noncompliance at home; collateral obtained from daughter, especially with lasix  - continue amlodipine  - increase imdur  and watch response overnight  Venous insufficiency LE edema - patient adamantly refusing care at home with daughter and also avoiding bathing/cleaning due to pain - have instructed him, it's prudent to continue with wound care; may need to pre-medicate if necessary - evaluated by wound care; follow wound care rec's - HHRN to be arranged if able  in order to help continue care and inspection after discharge   Grade 1 diastolic dysfunction -patient with what he reports is stable bilateral lower extremity edema, no clear evidence of acute heart failure exacerbation  - LE edema in setting of venous insufficiency  - BNP likely appropriate in setting of underlying CKD  Cellulitis ruled out - d/c abx   Hyperlipidemia-continue home statin   Acute kidney injury superimposed on CKD stage IIIb -baseline creatinine roughly 2.0, currently elevated possibly due to chronically uncontrolled hypertension.  Interval History:  No events overnight. BP regimen further adjusted this morning. Called and spoke with daughter to update her.  Encouraged patient to allow wound care dressings and changes.    Old records reviewed in assessment of this patient  Antimicrobials:   DVT prophylaxis:  heparin  injection 5,000 Units Start: 01/13/24 2200   Code Status:   Code Status: Full Code  Mobility Assessment (Last 72 Hours)     Mobility Assessment     Row Name 01/14/24 0800 01/13/24 1627         Does the patient have exclusion criteria? No - Perform mobility assessment No - Perform mobility assessment      What is the highest level of mobility based on the mobility assessment? Level 2 (Chairfast) - Balance while sitting on edge of bed and cannot stand  after wound care, patient told MD that he was having trouble with legs. Level 4 (Ambulates with assistance) - Balance while stepping forward/back - Complete         Barriers to discharge: none Disposition Plan:  Home HH orders placed: RN Status is: Obs  Objective: Blood pressure (!) 131/37, pulse (!) 48, temperature 99.4 F (37.4 C), temperature source Oral, resp. rate (!) 21, height 6' (1.829 m), weight 84.5 kg, SpO2 92%.  Examination:  Physical Exam Constitutional:      General: He is not in acute distress.    Appearance: Normal appearance.  HENT:     Head: Normocephalic and  atraumatic.     Mouth/Throat:     Mouth: Mucous membranes are moist.  Eyes:     Extraocular Movements: Extraocular movements intact.  Cardiovascular:     Rate and Rhythm: Normal rate and regular rhythm.  Pulmonary:     Effort: Pulmonary effort is normal. No respiratory distress.     Breath sounds: Normal breath sounds. No wheezing.  Abdominal:     General: Bowel sounds are normal. There is no distension.     Palpations: Abdomen is soft.     Tenderness: There is no abdominal tenderness.  Musculoskeletal:        General: Normal range of motion.     Cervical back: Normal range of motion and neck supple.     Comments: B/L LE noted with 2+ edema very TTP with chronic appearing ulcers but no erythema, calor;   Skin:    General: Skin is warm and dry.  Neurological:     General: No focal deficit present.     Mental Status: He is alert.  Psychiatric:        Mood and Affect: Mood normal.        Behavior: Behavior normal.      Consultants:    Procedures:    Data Reviewed: Results for orders placed or performed during the hospital encounter of 01/13/24 (from the past 24 hours)  Lactic acid, plasma     Status: None   Collection Time: 01/13/24  3:37 PM  Result Value Ref Range   Lactic Acid, Venous 1.1 0.5 - 1.9 mmol/L  Brain natriuretic peptide     Status: Abnormal   Collection Time: 01/13/24  5:28 PM  Result Value Ref Range   B Natriuretic Peptide 166.3 (H) 0.0 - 100.0 pg/mL  MRSA Next Gen by PCR, Nasal     Status: None   Collection Time: 01/13/24  5:40 PM   Specimen: Nasal Mucosa; Nasal Swab  Result Value Ref Range   MRSA by PCR Next Gen NOT DETECTED NOT DETECTED  Basic metabolic panel     Status: Abnormal   Collection Time: 01/14/24  2:58 AM  Result Value Ref Range   Sodium 138 135 - 145 mmol/L   Potassium 4.0 3.5 - 5.1 mmol/L   Chloride 108 98 - 111 mmol/L   CO2 22 22 - 32 mmol/L   Glucose, Bld 124 (H) 70 - 99 mg/dL   BUN 31 (H) 8 - 23 mg/dL   Creatinine, Ser 7.69 (H)  0.61 - 1.24 mg/dL   Calcium 8.1 (L) 8.9 - 10.3 mg/dL   GFR, Estimated 26 (L) >60 mL/min   Anion gap 8 5 - 15  CBC     Status: Abnormal   Collection Time: 01/14/24  2:58 AM  Result Value Ref Range   WBC 6.5 4.0 - 10.5 K/uL   RBC 3.52 (L) 4.22 - 5.81 MIL/uL   Hemoglobin 9.0 (L) 13.0 - 17.0 g/dL   HCT 69.9 (L) 60.9 - 47.9 %   MCV 85.2 80.0 - 100.0 fL   MCH 25.6 (L) 26.0 - 34.0 pg   MCHC 30.0 30.0 - 36.0 g/dL  RDW 15.0 11.5 - 15.5 %   Platelets 227 150 - 400 K/uL   nRBC 0.0 0.0 - 0.2 %    I have reviewed pertinent nursing notes, vitals, labs, and images as necessary. I have ordered labwork to follow up on as indicated.  I have reviewed the last notes from staff over past 24 hours. I have discussed patient's care plan and test results with nursing staff, CM/SW, and other staff as appropriate.  Time spent: Greater than 50% of the 55 minute visit was spent in counseling/coordination of care for the patient as laid out in the A&P.   LOS: 0 days   Alm Apo, MD [facility] 01/14/2024, 1:27 PM

## 2024-01-15 DIAGNOSIS — I160 Hypertensive urgency: Secondary | ICD-10-CM | POA: Diagnosis not present

## 2024-01-15 DIAGNOSIS — Z91148 Patient's other noncompliance with medication regimen for other reason: Secondary | ICD-10-CM

## 2024-01-15 DIAGNOSIS — I159 Secondary hypertension, unspecified: Secondary | ICD-10-CM | POA: Diagnosis not present

## 2024-01-15 DIAGNOSIS — I872 Venous insufficiency (chronic) (peripheral): Secondary | ICD-10-CM | POA: Diagnosis not present

## 2024-01-15 MED ORDER — ISOSORBIDE MONONITRATE ER 60 MG PO TB24
90.0000 mg | ORAL_TABLET | Freq: Every day | ORAL | Status: DC
  Administered 2024-01-15 – 2024-01-16 (×3): 90 mg via ORAL
  Filled 2024-01-15 (×2): qty 2

## 2024-01-15 NOTE — Plan of Care (Signed)
 Patient up to recliner with 2:1 and walker.

## 2024-01-15 NOTE — Progress Notes (Signed)
 Progress Note    Isaac Phelps   FMW:[id number]  DOB: 16-Sep-1930  DOA: 01/13/2024     0 PCP: Merna Huxley, NP  Initial CC: LE swelling/pain and uncontrolled BP  Hospital Course: Isaac Phelps is a 89 y.o. male with medical history significant for poorly controlled hypertension due to noncompliance, GERD, hyperlipidemia, grade 1 diastolic dysfunction being admitted to the hospital with complaints of lower extremity edema and found to have poorly controlled hypertension.    Notably, patient was admitted to the hospital in February with asymptomatic systolic blood pressure in the 220s after having run out of his blood pressure medications and not obtaining refills.  He has chronic lower extremity edema, something he also complained of at that time.   During that hospitalization, he was started on amlodipine , hydralazine  and Coreg .  He complained of worsening LE pain and swelling on this admission to the hospital.  He had a recent LE ultrasound that showed significant venus reflux/insufficiency and plans are for referral to vein specialist.   Collateral information obtained from his daughter who also helps with his meds confirms that he often refuses his BP meds at home, notably lasix  due to the increased urination.   He was admitted for further BP control and LE wound care.  Assessment/Plan   Hypertensive urgency - resolved  HTN -without chest pain, dizziness, headache, confusion or other concerning symptoms.   - etiology suspected due to noncompliance at home; collateral obtained from daughter, especially with lasix  - continue amlodipine  - increased imdur  further; so far good response   Venous insufficiency LE edema - patient adamantly refusing care at home with daughter and also avoiding bathing/cleaning due to pain - have instructed him, it's prudent to continue with wound care; may need to pre-medicate if necessary - evaluated by wound care; follow wound care rec's - HHRN to  be arranged if able in order to help continue care and inspection after discharge   Grade 1 diastolic dysfunction -patient with what he reports is stable bilateral lower extremity edema, no clear evidence of acute heart failure exacerbation  - LE edema in setting of venous insufficiency  - BNP likely appropriate in setting of underlying CKD  Cellulitis ruled out - d/c abx   Hyperlipidemia-continue home statin   Acute kidney injury superimposed on CKD stage IIIb -baseline creatinine roughly 2, currently elevated possibly due to chronically uncontrolled hypertension.  Interval History:  BP improving. Tentative plan for discharge home today but trying to obtain PT eval given some deconditioning. He allowed dressings to be applied yesterday.   Old records reviewed in assessment of this patient  Antimicrobials:   DVT prophylaxis:  heparin  injection 5,000 Units Start: 01/13/24 2200   Code Status:   Code Status: Full Code  Mobility Assessment (Last 72 Hours)     Mobility Assessment     Row Name 01/15/24 1200 01/14/24 2000 01/14/24 0800 01/13/24 1627     Does the patient have exclusion criteria? No - Perform mobility assessment Yes- Hold (Level 0) - Assessment complete No - Perform mobility assessment No - Perform mobility assessment    What is the highest level of mobility based on the mobility assessment? Level 2 (Chairfast) - Balance while sitting on edge of bed and cannot stand Level 2 (Chairfast) - Balance while sitting on edge of bed and cannot stand Level 2 (Chairfast) - Balance while sitting on edge of bed and cannot stand  after wound care, patient told MD that he was having  trouble with legs. Level 4 (Ambulates with assistance) - Balance while stepping forward/back - Complete    Is the above level different from baseline mobility prior to current illness? Yes - Recommend PT order No - Consider discontinuing PT/OT -- --       Barriers to discharge: none Disposition Plan:   Home HH orders placed: RN Status is: Obs   Objective: Blood pressure (!) 173/51, pulse (!) 57, temperature 99.4 F (37.4 C), temperature source Oral, resp. rate (!) 28, height 6' (1.829 m), weight 84.5 kg, SpO2 98%.  Examination:  Physical Exam Constitutional:      General: He is not in acute distress.    Appearance: Normal appearance.  HENT:     Head: Normocephalic and atraumatic.     Mouth/Throat:     Mouth: Mucous membranes are moist.  Eyes:     Extraocular Movements: Extraocular movements intact.  Cardiovascular:     Rate and Rhythm: Normal rate and regular rhythm.  Pulmonary:     Effort: Pulmonary effort is normal. No respiratory distress.     Breath sounds: Normal breath sounds. No wheezing.  Abdominal:     General: Bowel sounds are normal. There is no distension.     Palpations: Abdomen is soft.     Tenderness: There is no abdominal tenderness.  Musculoskeletal:        General: Normal range of motion.     Cervical back: Normal range of motion and neck supple.     Comments: B/L LE noted with 2+ edema very TTP with chronic appearing ulcers but no erythema, calor;   Skin:    General: Skin is warm and dry.  Neurological:     General: No focal deficit present.     Mental Status: He is alert.  Psychiatric:        Mood and Affect: Mood normal.        Behavior: Behavior normal.      Consultants:    Procedures:    Data Reviewed: No results found for this or any previous visit (from the past 24 hours).   I have reviewed pertinent nursing notes, vitals, labs, and images as necessary. I have ordered labwork to follow up on as indicated.  I have reviewed the last notes from staff over past 24 hours. I have discussed patient's care plan and test results with nursing staff, CM/SW, and other staff as appropriate.  Time spent: Greater than 50% of the 55 minute visit was spent in counseling/coordination of care for the patient as laid out in the A&P.   LOS: 0 days    Alm Apo, MD [facility] 01/15/2024, 3:52 PM

## 2024-01-15 NOTE — TOC Initial Note (Signed)
 Transition of Care [facility]) - Initial/Assessment Note    Patient Details  Name: Isaac Phelps MRN: [id number] Date of Birth: 12-Feb-1931  Transition of Care [facility] CM/SW Contact:    Isaac ONEIDA Anon, RN Phone Number: 01/15/2024, 11:41 AM  Clinical Narrative:                 NCM met with pt at bedside, introducing role in DC planning. Spoke with pt about needing HHRN services at DC, and pt states to speak with her daughter about these things. NCM spoke with pt daughter Isaac Phelps via telephone at [phone] about pt needing [facility] services at DC and she is agreeable to NCM setting up [facility]. She states pt uses a walker and cane at baseline for ambulation. Denies any current HH or other DME needs. No SDOH risks identified. NCM set up [facility] services through [facility]. HH orders are in place. Care Management following.    Expected Discharge Plan: Home w Home Health Services Barriers to Discharge: Continued Medical Work up   Patient Goals and CMS Choice Patient states their goals for this hospitalization and ongoing recovery are:: To return home CMS Medicare.gov Compare Post Acute Care list provided to:: Patient Choice offered to / list presented to : Patient [place] ownership interest in [facility].provided to:: Patient    Expected Discharge Plan and Services In-house Referral: NA Discharge Planning Services: CM Consult Post Acute Care Choice: Durable Medical Equipment, Home Health Living arrangements for the past 2 months: Single Family Home                 DME Arranged: N/A DME Agency: NA       HH Arranged: RN HH AgencyHotel manager Home Health Care Date [facility] Agency Contacted: 01/14/24 Time HH Agency Contacted: 1312 Representative spoke with at [facility] Agency: Cindie Sillmon  Prior Living Arrangements/Services Living arrangements for the past 2 months: Single Family Home Lives with:: Self Patient language and need for interpreter reviewed:: Yes Do you feel safe going back to the  place where you live?: Yes      Need for Family Participation in Patient Care: Yes (Comment) Care giver support system in place?: Yes (comment) Current home services: DME Isaac Phelps, Cane) Criminal Activity/Legal Involvement Pertinent to Current Situation/Hospitalization: No - Comment as needed  Activities of Daily Living   ADL Screening (condition at time of admission) Independently performs ADLs?: Yes (appropriate for developmental age) Is the patient deaf or have difficulty hearing?: Yes Does the patient have difficulty seeing, even when wearing glasses/contacts?: No Does the patient have difficulty concentrating, remembering, or making decisions?: No  Permission Sought/Granted Permission sought to share information with : Family Supports Permission granted to share information with : Yes, Verbal Permission Granted  Share Information with NAME: Isaac Phelps (Daughter)  [phone]           Emotional Assessment Appearance:: Appears stated age Attitude/Demeanor/Rapport: Engaged Affect (typically observed): Appropriate Orientation: : Oriented to Self, Oriented to Place, Oriented to  Time, Oriented to Situation Alcohol  / Substance Use: Not Applicable Psych Involvement: No (comment)  Admission diagnosis:  Cellulitis [L03.90] Lower extremity edema [R60.0] Cellulitis, unspecified cellulitis site [L03.90] Patient Active Problem List   Diagnosis Date Noted   History of medication noncompliance 01/14/2024   Chronic venous insufficiency 01/13/2024   Impaired mobility 07/19/2023   Chronic diastolic CHF (congestive heart failure) (HCC) 07/18/2023   Adenocarcinoma of rectum (HCC)    Cerebral embolism with cerebral infarction 08/01/2020   Iron deficiency  anemia    HTN (hypertension), malignant 07/29/2020   AKI (acute kidney injury) (HCC)    Anemia of chronic disease 05/07/2017   Gouty arthritis 03/21/2016   CKD (chronic kidney disease) stage 3, GFR 30-59 ml/min (HCC) 02/09/2015    TOBACCO ABUSE 04/01/2008   BPH (benign prostatic hyperplasia) 04/01/2008   Dyslipidemia 05/26/2007   Essential hypertension 02/07/2007   PCP:  Merna Huxley, NP Pharmacy:   [id number] [address] ([address]), [place] - [address] [place] ([place]) [place] [address] Phone: [phone] Fax: [phone]     Social Drivers of Health (SDOH) Social History: SDOH Screenings   Food Insecurity: No Food Insecurity (01/13/2024)  Housing: Low Risk  (01/13/2024)  Transportation Needs: No Transportation Needs (01/13/2024)  Utilities: Not At Risk (01/13/2024)  Alcohol  Screen: Low Risk  (11/21/2023)  Depression (PHQ2-9): Low Risk  (11/21/2023)  Financial Resource Strain: Low Risk  (11/21/2023)  Physical Activity: Inactive (11/21/2023)  Social Connections: Socially Isolated (01/13/2024)  Stress: No Stress Concern Present (11/21/2023)  Tobacco Use: Medium Risk (01/13/2024)  Health Literacy: Inadequate Health Literacy (11/21/2023)   SDOH Interventions:     Readmission Risk Interventions     No data to display

## 2024-01-16 ENCOUNTER — Encounter (HOSPITAL_BASED_OUTPATIENT_CLINIC_OR_DEPARTMENT_OTHER): Admitting: Family

## 2024-01-16 DIAGNOSIS — I10 Essential (primary) hypertension: Secondary | ICD-10-CM | POA: Diagnosis not present

## 2024-01-16 DIAGNOSIS — Z7409 Other reduced mobility: Secondary | ICD-10-CM

## 2024-01-16 DIAGNOSIS — I872 Venous insufficiency (chronic) (peripheral): Secondary | ICD-10-CM | POA: Diagnosis not present

## 2024-01-16 DIAGNOSIS — I160 Hypertensive urgency: Secondary | ICD-10-CM | POA: Diagnosis not present

## 2024-01-16 MED ORDER — SENNOSIDES-DOCUSATE SODIUM 8.6-50 MG PO TABS
1.0000 | ORAL_TABLET | Freq: Two times a day (BID) | ORAL | Status: DC
  Administered 2024-01-16 – 2024-01-20 (×9): 1 via ORAL
  Filled 2024-01-16 (×9): qty 1

## 2024-01-16 MED ORDER — ISOSORBIDE MONONITRATE ER 60 MG PO TB24
120.0000 mg | ORAL_TABLET | Freq: Every day | ORAL | Status: DC
  Administered 2024-01-17 – 2024-01-20 (×4): 120 mg via ORAL
  Filled 2024-01-16 (×4): qty 2

## 2024-01-16 MED ORDER — ISOSORBIDE MONONITRATE ER 60 MG PO TB24
30.0000 mg | ORAL_TABLET | Freq: Once | ORAL | Status: AC
  Administered 2024-01-16: 30 mg via ORAL
  Filled 2024-01-16: qty 1

## 2024-01-16 MED ORDER — ORAL CARE MOUTH RINSE: 15.0000 mL | OROMUCOSAL | Status: DC | PRN

## 2024-01-16 MED ORDER — POLYETHYLENE GLYCOL 3350 17 G PO PACK: 17.0000 g | PACK | Freq: Every day | ORAL | Status: DC | PRN

## 2024-01-16 NOTE — Plan of Care (Signed)

## 2024-01-16 NOTE — Progress Notes (Signed)
 Patient expressed to this nurse that he would not like CPR/intubation measures as he, wants to go in peace when I die. This nurse informed Dr. Patsy of this patients statement/wishes for follow up.

## 2024-01-16 NOTE — Progress Notes (Signed)
 Progress Note    Isaac Phelps   FMW:[id number]  DOB: January 24, 1931  DOA: 01/13/2024     0 PCP: Merna Huxley, NP  Initial CC: LE swelling/pain and uncontrolled BP  Hospital Course: Isaac Phelps is a 89 y.o. male with medical history significant for poorly controlled hypertension due to noncompliance, GERD, hyperlipidemia, grade 1 diastolic dysfunction being admitted to the hospital with complaints of lower extremity edema and found to have poorly controlled hypertension.    Notably, patient was admitted to the hospital in February with asymptomatic systolic blood pressure in the 220s after having run out of his blood pressure medications and not obtaining refills.  He has chronic lower extremity edema, something he also complained of at that time.   During that hospitalization, he was started on amlodipine , hydralazine  and Coreg .  He complained of worsening LE pain and swelling on this admission to the hospital.  He had a recent LE ultrasound that showed significant venus reflux/insufficiency and plans are for referral to vein specialist.   Collateral information obtained from his daughter who also helps with his meds confirms that he often refuses his BP meds at home, notably lasix  due to the increased urination.   He was admitted for further BP control and LE wound care.  Assessment/Plan   Hypertensive urgency - resolved  HTN -without chest pain, dizziness, headache, confusion or other concerning symptoms.   - etiology suspected due to noncompliance at home; collateral obtained from daughter, especially with lasix  - continue amlodipine  - increased imdur  further; so far good response   Physical deconditioning Fall - Overnight patient lowered himself to the ground after having a dream.  Denied any trauma and recalled the events well - Evaluated by PT with recommendations for SNF however he is not wishing to go - Will continue monitoring his progression in the hospital while still  working on blood pressure control some  Venous insufficiency LE edema - patient adamantly refusing care at home with daughter and also avoiding bathing/cleaning due to pain - have instructed him, it's prudent to continue with wound care; may need to pre-medicate if necessary - evaluated by wound care; follow wound care rec's - HHRN to be arranged if able in order to help continue care and inspection after discharge   Grade 1 diastolic dysfunction -patient with what he reports is stable bilateral lower extremity edema, no clear evidence of acute heart failure exacerbation  - LE edema in setting of venous insufficiency  - BNP likely appropriate in setting of underlying CKD  Cellulitis ruled out - d/c abx   Hyperlipidemia-continue home statin   Acute kidney injury superimposed on CKD stage IIIb -baseline creatinine roughly 2, currently elevated possibly due to chronically uncontrolled hypertension.  Interval History:  Appears he had a dream last night and was trying to get out of the bed but too weak to stand so he lowered himself to the floor. No obvious injuries this morning. Working with PT this morning.  He does not wish to go to a rehab facility at this time however.   Old records reviewed in assessment of this patient  Antimicrobials:   DVT prophylaxis:  heparin  injection 5,000 Units Start: 01/13/24 2200   Code Status:   Code Status: Full Code  Mobility Assessment (Last 72 Hours)     Mobility Assessment     Row Name 01/16/24 1319 01/16/24 0800 01/15/24 2000 01/15/24 1200 01/14/24 2000   Does the patient have exclusion criteria? -- No - Perform  mobility assessment No - Perform mobility assessment No - Perform mobility assessment Yes- Hold (Level 0) - Assessment complete   What is the highest level of mobility based on the mobility assessment? Level 3 (Stands with assistance) - Balance while standing  and cannot march in place Level 3 (Stands with assistance) - Balance  while standing  and cannot march in place Level 3 (Stands with assistance) - Balance while standing  and cannot march in place Level 2 (Chairfast) - Balance while sitting on edge of bed and cannot stand Level 2 (Chairfast) - Balance while sitting on edge of bed and cannot stand   Is the above level different from baseline mobility prior to current illness? -- Yes - Recommend PT order Yes - Recommend PT order Yes - Recommend PT order No - Consider discontinuing PT/OT    Row Name 01/14/24 0800 01/13/24 1627         Does the patient have exclusion criteria? No - Perform mobility assessment No - Perform mobility assessment      What is the highest level of mobility based on the mobility assessment? Level 2 (Chairfast) - Balance while sitting on edge of bed and cannot stand  after wound care, patient told MD that he was having trouble with legs. Level 4 (Ambulates with assistance) - Balance while stepping forward/back - Complete         Barriers to discharge: none Disposition Plan:  Home HH orders placed: RN Status is: Obs   Objective: Blood pressure (!) 168/67, pulse (!) 59, temperature 98.8 F (37.1 C), temperature source Axillary, resp. rate 15, height 6' (1.829 m), weight 84.5 kg, SpO2 92%.  Examination:  Physical Exam Constitutional:      General: He is not in acute distress.    Appearance: Normal appearance.  HENT:     Head: Normocephalic and atraumatic.     Mouth/Throat:     Mouth: Mucous membranes are moist.  Eyes:     Extraocular Movements: Extraocular movements intact.  Cardiovascular:     Rate and Rhythm: Normal rate and regular rhythm.  Pulmonary:     Effort: Pulmonary effort is normal. No respiratory distress.     Breath sounds: Normal breath sounds. No wheezing.  Abdominal:     General: Bowel sounds are normal. There is no distension.     Palpations: Abdomen is soft.     Tenderness: There is no abdominal tenderness.  Musculoskeletal:        General: Normal range of  motion.     Cervical back: Normal range of motion and neck supple.     Comments: B/L LE noted with 2+ edema very TTP with chronic appearing ulcers but no erythema, calor;   Skin:    General: Skin is warm and dry.  Neurological:     General: No focal deficit present.     Mental Status: He is alert.  Psychiatric:        Mood and Affect: Mood normal.        Behavior: Behavior normal.      Consultants:    Procedures:    Data Reviewed: No results found for this or any previous visit (from the past 24 hours).   I have reviewed pertinent nursing notes, vitals, labs, and images as necessary. I have ordered labwork to follow up on as indicated.  I have reviewed the last notes from staff over past 24 hours. I have discussed patient's care plan and test results with nursing staff, CM/SW,  and other staff as appropriate.  Time spent: Greater than 50% of the 55 minute visit was spent in counseling/coordination of care for the patient as laid out in the A&P.   LOS: 0 days   Alm Apo, MD [facility] 01/16/2024, 1:39 PM

## 2024-01-16 NOTE — Progress Notes (Signed)
    Patient Name: ANUP BRIGHAM           DOB: 04/19/1931  MRN: [id number]      Admission Date: 01/13/2024  Attending Provider: Patsy Lenis, MD  Primary Diagnosis: Hypertensive urgency   Level of care: Stepdown    CROSS COVER NOTE   Date of Service   01/16/2024   KAHLIL COWANS, 89 y.o. male, was admitted on 01/13/2024 for Hypertensive urgency.    HPI/Events of Note   Unwitnessed fall Fall occurred while patient was trying to get out of bed. Slid out of bed and landed on his buttocks. No head injury. A/O.  Denies pain. No visible sign of injury or deformities.ROM at baseline.   Patient was assisted back to bed without complications.    Interventions/ Plan   Nursing staff to continue monitoring for change in acute symptoms, mobility, and pain. TeleSitter added        Zaelynn Fuchs, DNP, ACNPC- AG [facility] [place] [facility]

## 2024-01-16 NOTE — Evaluation (Signed)
 Physical Therapy Evaluation Patient Details Name: Isaac Phelps MRN: [id number] DOB: 1931-02-03 Today's Date: 01/16/2024  History of Present Illness  89 y.o. male admitted to the hospital 01/13/24 with edema of legs, noted uncontrolled hypertension.PMH: treated rectal cancer, hypertension, BPH, syncope and collapse  Clinical Impression  Pt admitted with above diagnosis.  Pt currently with functional limitations due to the deficits listed below (see PT Problem List). Pt will benefit from acute skilled PT to increase their independence and safety with mobility to allow discharge.     The patient reports resides alone, ambulates without AD usually in the home. Patient reports that he was ambulating when he came in and now cannot ambulate. Patient required mod support to mobilize to sitting, assist with LE's. Mod support to stand from raised bed then, mod support to  step to recliner, decreased descent control. Patient reports right LE pain with WB. Patient will benefit from continued inpatient follow up therapy, <3 hours/day.  BP 177/48,        If plan is discharge home, recommend the following: Two people to help with walking and/or transfers;A lot of help with bathing/dressing/bathroom;Assist for transportation;Help with stairs or ramp for entrance   Can travel by private vehicle        Equipment Recommendations None recommended by PT  Recommendations for Other Services    OT   Functional Status Assessment Patient has had a recent decline in their functional status and demonstrates the ability to make significant improvements in function in a reasonable and predictable amount of time.     Precautions / Restrictions Precautions Precautions: Fall Precaution/Restrictions Comments: weeping Le's Restrictions Weight Bearing Restrictions Per Provider Order: No      Mobility  Bed Mobility Overal bed mobility: Needs Assistance Bed Mobility: Supine to Sit     Supine to sit: Mod  assist, HOB elevated     General bed mobility comments: assist  trunk to sitting upright    Transfers Overall transfer level: Needs assistance Equipment used: Rolling walker (2 wheels) Transfers: Sit to/from Stand, Bed to chair/wheelchair/BSC Sit to Stand: Mod assist, From elevated surface   Step pivot transfers: Mod assist       General transfer comment: extra support to rise from bed x 2, ZPatient requires extra time  to  transition  hands to  RW , leaning far over until could straighten up. Slow to step to recliner, antalgic on the RLE    Ambulation/Gait                  Stairs            Wheelchair Mobility     Tilt Bed    Modified Rankin (Stroke Patients Only)       Balance Overall balance assessment: Needs assistance Sitting-balance support: Bilateral upper extremity supported, Feet supported Sitting balance-Leahy Scale: Fair     Standing balance support: Bilateral upper extremity supported, During functional activity, Reliant on assistive device for balance Standing balance-Leahy Scale: Poor Standing balance comment: strongly  requires  UE support                             Pertinent Vitals/Pain Pain Assessment Pain Assessment: Faces Faces Pain Scale: Hurts even more Pain Location: right  leg Pain Descriptors / Indicators: Aching, Discomfort Pain Intervention(s): Limited activity within patient's tolerance, Monitored during session, Premedicated before session    Home Living Family/patient expects to be discharged to:: Private  residence Living Arrangements: Alone Available Help at Discharge: Family;Available PRN/intermittently Type of Home: House Home Access: Stairs to enter Entrance Stairs-Rails: None Entrance Stairs-Number of Steps: 1 Alternate Level Stairs-Number of Steps: 2 stairs to kitchen Home Layout: Two level Home Equipment: Cane - single point;Grab bars - tub/shower;Rolling Walker (2 wheels)      Prior  Function Prior Level of Function : Independent/Modified Independent;Driving             Mobility Comments: Walks with a SPC; denies falls in past 6 months ADLs Comments: Performs own ADL, family assists with iADL.  Generally does light meal prep or orders out. Daughter takes him to get groceries.     Extremity/Trunk Assessment   Upper Extremity Assessment Upper Extremity Assessment: Overall WFL for tasks assessed    Lower Extremity Assessment Lower Extremity Assessment: LLE deficits/detail;RLE deficits/detail RLE Deficits / Details: lower leg dressing, weeping, decreased WB tolerated LLE Deficits / Details: dressings in place    Cervical / Trunk Assessment Cervical / Trunk Assessment: Normal  Communication   Communication Communication: No apparent difficulties Factors Affecting Communication: Hearing impaired    Cognition Arousal: Alert Behavior During Therapy: WFL for tasks assessed/performed   PT - Cognitive impairments: Difficult to assess, Safety/Judgement                       PT - Cognition Comments: pt did state that he got  to bed edge and sat to floor last night because he was dreaming. Stating that he felt that he can return home(even with limited ambulation this visit) Following commands: Intact       Cueing Cueing Techniques: Verbal cues     General Comments      Exercises     Assessment/Plan    PT Assessment Patient needs continued PT services  PT Problem List Decreased strength;Decreased mobility;Decreased range of motion;Decreased skin integrity;Decreased activity tolerance;Decreased knowledge of use of DME;Pain;Decreased balance       PT Treatment Interventions DME instruction;Therapeutic activities;Gait training;Therapeutic exercise;Patient/family education;Functional mobility training    PT Goals (Current goals can be found in the Care Plan section)  Acute Rehab PT Goals Patient Stated Goal: to go home PT Goal Formulation: With  patient Time For Goal Achievement: 01/30/24 Potential to Achieve Goals: Fair    Frequency Min 2X/week     Co-evaluation               AM-PAC PT 6 Clicks Mobility  Outcome Measure Help needed turning from your back to your side while in a flat bed without using bedrails?: A Lot Help needed moving from lying on your back to sitting on the side of a flat bed without using bedrails?: A Lot Help needed moving to and from a bed to a chair (including a wheelchair)?: A Lot Help needed standing up from a chair using your arms (e.g., wheelchair or bedside chair)?: A Lot Help needed to walk in hospital room?: Total Help needed climbing 3-5 steps with a railing? : Total 6 Click Score: 10    End of Session Equipment Utilized During Treatment: Gait belt Activity Tolerance: Patient limited by pain Patient left: in chair;with call bell/phone within reach;with nursing/sitter in room Nurse Communication: Mobility status PT Visit Diagnosis: Unsteadiness on feet (R26.81);Difficulty in walking, not elsewhere classified (R26.2);Pain    Time: 9166-9087 PT Time Calculation (min) (ACUTE ONLY): 39 min   Charges:   PT Evaluation $PT Eval Low Complexity: 1 Low PT Treatments $Therapeutic Activity: 23-37 mins  PT General Charges $$ ACUTE PT VISIT: 1 Visit         Darice Potters PT Acute Rehabilitation Services Office [phone]   Potters Darice Norris 01/16/2024, 1:23 PM

## 2024-01-16 NOTE — Progress Notes (Incomplete)
 Patient is in agreeance with [facility]

## 2024-01-16 NOTE — Progress Notes (Signed)
 As stated by the patient, the following events took places as such. Patient woke up and did not realize where he was. Did not understand he was in a hospital bed. Patient went to slide out of bed because he was dreaming. Patient has a condom catheter on to assist with urination, patient stated remembering he removed the condom cath as he was attempting to exit the bed. Once he removed the condom cath the floor and his dressing on the low leg extremities became soiled. Patient then proceeded to inch himself to the end of the bed and lowered himself onto the floor after not being able to support his own weight. He then grabbed the pillow from the bed and placed it under his bottom. Patient denied hitting his head. He is alerted and oriented to baseline from previous assessment. Staff began assessing patient condition as soon as he was found on the ground. No signs of injury, alert and speaking with staff. Patient was then lifted back into the bed using the maxi-sky lift. Patient was cleaned and dressings changed. See Flowsheets for Assessment; 01/16/24 @0600   6:57 AM 01/16/2024 Bertram MARLA Merles

## 2024-01-17 DIAGNOSIS — Z7409 Other reduced mobility: Secondary | ICD-10-CM | POA: Diagnosis present

## 2024-01-17 DIAGNOSIS — I5033 Acute on chronic diastolic (congestive) heart failure: Secondary | ICD-10-CM | POA: Diagnosis present

## 2024-01-17 DIAGNOSIS — I10 Essential (primary) hypertension: Secondary | ICD-10-CM | POA: Diagnosis not present

## 2024-01-17 DIAGNOSIS — Z7982 Long term (current) use of aspirin: Secondary | ICD-10-CM | POA: Diagnosis not present

## 2024-01-17 DIAGNOSIS — Z602 Problems related to living alone: Secondary | ICD-10-CM | POA: Diagnosis present

## 2024-01-17 DIAGNOSIS — Z91128 Patient's intentional underdosing of medication regimen for other reason: Secondary | ICD-10-CM | POA: Diagnosis not present

## 2024-01-17 DIAGNOSIS — I779 Disorder of arteries and arterioles, unspecified: Secondary | ICD-10-CM | POA: Diagnosis present

## 2024-01-17 DIAGNOSIS — I160 Hypertensive urgency: Secondary | ICD-10-CM | POA: Diagnosis present

## 2024-01-17 DIAGNOSIS — Z87891 Personal history of nicotine dependence: Secondary | ICD-10-CM | POA: Diagnosis not present

## 2024-01-17 DIAGNOSIS — K219 Gastro-esophageal reflux disease without esophagitis: Secondary | ICD-10-CM | POA: Diagnosis present

## 2024-01-17 DIAGNOSIS — I130 Hypertensive heart and chronic kidney disease with heart failure and stage 1 through stage 4 chronic kidney disease, or unspecified chronic kidney disease: Secondary | ICD-10-CM | POA: Diagnosis present

## 2024-01-17 DIAGNOSIS — E785 Hyperlipidemia, unspecified: Secondary | ICD-10-CM | POA: Diagnosis present

## 2024-01-17 DIAGNOSIS — Z85048 Personal history of other malignant neoplasm of rectum, rectosigmoid junction, and anus: Secondary | ICD-10-CM | POA: Diagnosis not present

## 2024-01-17 DIAGNOSIS — N179 Acute kidney failure, unspecified: Secondary | ICD-10-CM | POA: Diagnosis present

## 2024-01-17 DIAGNOSIS — N400 Enlarged prostate without lower urinary tract symptoms: Secondary | ICD-10-CM | POA: Diagnosis present

## 2024-01-17 DIAGNOSIS — N1832 Chronic kidney disease, stage 3b: Secondary | ICD-10-CM | POA: Diagnosis present

## 2024-01-17 DIAGNOSIS — Z79899 Other long term (current) drug therapy: Secondary | ICD-10-CM | POA: Diagnosis not present

## 2024-01-17 DIAGNOSIS — L97929 Non-pressure chronic ulcer of unspecified part of left lower leg with unspecified severity: Secondary | ICD-10-CM | POA: Diagnosis present

## 2024-01-17 DIAGNOSIS — I872 Venous insufficiency (chronic) (peripheral): Secondary | ICD-10-CM | POA: Diagnosis present

## 2024-01-17 DIAGNOSIS — T501X6A Underdosing of loop [high-ceiling] diuretics, initial encounter: Secondary | ICD-10-CM | POA: Diagnosis present

## 2024-01-17 DIAGNOSIS — Z91148 Patient's other noncompliance with medication regimen for other reason: Secondary | ICD-10-CM | POA: Diagnosis not present

## 2024-01-17 DIAGNOSIS — L97919 Non-pressure chronic ulcer of unspecified part of right lower leg with unspecified severity: Secondary | ICD-10-CM | POA: Diagnosis present

## 2024-01-17 DIAGNOSIS — I2510 Atherosclerotic heart disease of native coronary artery without angina pectoris: Secondary | ICD-10-CM | POA: Diagnosis present

## 2024-01-17 MED ORDER — FUROSEMIDE 10 MG/ML IJ SOLN
40.0000 mg | Freq: Once | INTRAMUSCULAR | Status: AC
  Administered 2024-01-17: 40 mg via INTRAVENOUS
  Filled 2024-01-17: qty 4

## 2024-01-17 NOTE — NC FL2 (Signed)
 [place]  MEDICAID FL2 LEVEL OF CARE FORM     IDENTIFICATION  Patient Name: Isaac Phelps Birthdate: 02/08/1931 Sex: male Admission Date (Current Location): 01/13/2024  [facility] and [place] Number:  Producer, television/film/video and Address:  [facility],  [address] [place], [place] [address]      Provider Number: [id number]  Attending Physician Name and Address:  Patsy Lenis, MD  Relative Name and Phone Number:  Lurena Helling (Daughter)  [phone]    Current Level of Care: Hospital Recommended Level of Care: Skilled Nursing Facility Prior Approval Number:    Date Approved/Denied:   PASRR Number: [id number]  Discharge Plan: SNF    Current Diagnoses: Patient Active Problem List   Diagnosis Date Noted   Hypertensive urgency 01/17/2024   History of medication noncompliance 01/14/2024   Chronic venous insufficiency 01/13/2024   Impaired mobility 07/19/2023   Chronic diastolic CHF (congestive heart failure) (HCC) 07/18/2023   Adenocarcinoma of rectum (HCC)    Cerebral embolism with cerebral infarction 08/01/2020   Iron deficiency anemia    HTN (hypertension), malignant 07/29/2020   AKI (acute kidney injury) (HCC)    Anemia of chronic disease 05/07/2017   Gouty arthritis 03/21/2016   CKD (chronic kidney disease) stage 3, GFR 30-59 ml/min (HCC) 02/09/2015   TOBACCO ABUSE 04/01/2008   BPH (benign prostatic hyperplasia) 04/01/2008   Dyslipidemia 05/26/2007   Essential hypertension 02/07/2007    Orientation RESPIRATION BLADDER Height & Weight     Self, Time, Situation, Place  Normal Incontinent Weight: 84.5 kg Height:  6' (182.9 cm)  BEHAVIORAL SYMPTOMS/MOOD NEUROLOGICAL BOWEL NUTRITION STATUS      Continent Diet (Heart Healthy)  AMBULATORY STATUS COMMUNICATION OF NEEDS Skin   Extensive Assist Verbally Other (Comment) (Wound on toe, Vascular ulcer)                       Personal Care Assistance Level of Assistance  Bathing, Dressing, Feeding Bathing  Assistance: Limited assistance Feeding assistance: Limited assistance Dressing Assistance: Limited assistance     Functional Limitations Info  Hearing, Sight, Speech Sight Info: Impaired Hearing Info: Impaired Speech Info: Adequate    SPECIAL CARE FACTORS FREQUENCY  PT (By licensed PT), OT (By licensed OT)     PT Frequency: 5xwk OT Frequency: 5xwk            Contractures Contractures Info: Not present    Additional Factors Info  Code Status, Allergies Code Status Info: FULL Allergies Info: No Known Allergies           Current Medications (01/17/2024):  This is the current hospital active medication list Current Facility-Administered Medications  Medication Dose Route Frequency Provider Last Rate Last Admin   acetaminophen  (TYLENOL ) tablet 650 mg  650 mg Oral Q6H PRN Zella, Mir M, MD   650 mg at 01/16/24 1351   Or   acetaminophen  (TYLENOL ) suppository 650 mg  650 mg Rectal Q6H PRN Zella, Mir M, MD       albuterol  (PROVENTIL ) (2.5 MG/3ML) 0.083% nebulizer solution 2.5 mg  2.5 mg Nebulization Q2H PRN Zella, Mir M, MD       amLODipine  (NORVASC ) tablet 10 mg  10 mg Oral Daily Ikramullah, Mir M, MD   10 mg at 01/17/24 9071   aspirin  chewable tablet 81 mg  81 mg Oral Daily Lue Elsie BROCKS, MD   81 mg at 01/17/24 9071   Chlorhexidine  Gluconate Cloth 2 % PADS 6 each  6 each Topical Daily Girguis,  Alm, MD   6 each at 01/16/24 1002   heparin  injection 5,000 Units  5,000 Units Subcutaneous Q8H Zella, Mir M, MD   5,000 Units at 01/17/24 9389   isosorbide  mononitrate (IMDUR ) 24 hr tablet 120 mg  120 mg Oral Daily Patsy Alm, MD   120 mg at 01/17/24 9071   ondansetron  (ZOFRAN ) tablet 4 mg  4 mg Oral Q6H PRN Zella, Mir M, MD       Or   ondansetron  (ZOFRAN ) injection 4 mg  4 mg Intravenous Q6H PRN Zella Katha HERO, MD       Oral care mouth rinse  15 mL Mouth Rinse PRN Patsy Alm, MD       oxyCODONE  (Oxy IR/ROXICODONE ) immediate release tablet 5  mg  5 mg Oral Q4H PRN Zella, Mir M, MD   5 mg at 01/17/24 9071   polyethylene glycol (MIRALAX  / GLYCOLAX ) packet 17 g  17 g Oral Daily PRN Patsy Alm, MD       senna-docusate (Senokot-S) tablet 1 tablet  1 tablet Oral BID Patsy Alm, MD   1 tablet at 01/17/24 9071   simvastatin  (ZOCOR ) tablet 40 mg  40 mg Oral q AM Lue Elsie BROCKS, MD   40 mg at 01/17/24 9389   traZODone  (DESYREL ) tablet 25 mg  25 mg Oral QHS PRN Zella Katha HERO, MD   25 mg at 01/16/24 9970     Discharge Medications: Please see discharge summary for a list of discharge medications.  Relevant Imaging Results:  Relevant Lab Results:   Additional Information SSN: [id number]  Jon ONEIDA Anon, RN

## 2024-01-17 NOTE — TOC Progression Note (Signed)
 Transition of Care [facility]) - Progression Note    Patient Details  Name: Isaac Phelps MRN: [id number] Date of Birth: 08-09-1930  [facility]) CM/SW Contact  Jon ONEIDA Anon, RN Phone Number: 01/17/2024, 11:36 AM  Clinical Narrative:    PT recommending SNF. Pt and daughter discussed and agree to recommendation. NCM set out referrals for SNF placement, awaiting bed offers. Care Management continuing to follow.  Expected Discharge Plan: Home w Home Health Services Barriers to Discharge: Continued Medical Work up               Expected Discharge Plan and Services In-house Referral: NA Discharge Planning Services: CM Consult Post Acute Care Choice: Durable Medical Equipment, Home Health Living arrangements for the past 2 months: Single Family Home                 DME Arranged: N/A DME Agency: NA       HH Arranged: RN HH AgencyHotel manager Home Health Care Date HH Agency Contacted: 01/14/24 Time HH Agency Contacted: 1312 Representative spoke with at [facility] Agency: Cindie Sillmon   Social Drivers of Health (SDOH) Interventions SDOH Screenings   Food Insecurity: No Food Insecurity (01/13/2024)  Housing: Low Risk  (01/13/2024)  Transportation Needs: No Transportation Needs (01/13/2024)  Utilities: Not At Risk (01/13/2024)  Alcohol  Screen: Low Risk  (11/21/2023)  Depression (PHQ2-9): Low Risk  (11/21/2023)  Financial Resource Strain: Low Risk  (11/21/2023)  Physical Activity: Inactive (11/21/2023)  Social Connections: Socially Isolated (01/13/2024)  Stress: No Stress Concern Present (11/21/2023)  Tobacco Use: Medium Risk (01/13/2024)  Health Literacy: Inadequate Health Literacy (11/21/2023)    Readmission Risk Interventions     No data to display

## 2024-01-17 NOTE — Plan of Care (Signed)

## 2024-01-17 NOTE — Evaluation (Signed)
 Occupational Therapy Evaluation Patient Details Name: Isaac Phelps MRN: [id number] DOB: 1930-08-17 Today's Date: 01/17/2024   History of Present Illness   40 yr ol male admitted to the hospital 01/13/24 with edema of legs, noted uncontrolled hypertension.PMH: treated rectal cancer, hypertension, BPH, syncope and collapse     Clinical Impressions The pt is currently presenting below his baseline level of functioning for self-care management, as he is limited by the below listed deficits (see OT problem list). During the session, he required mod assist to stand using a RW and max assist for lower body dressing. He has noted to be with distal BLE edema and pain; pain increased with weight-bearing and limited the pt's standing tolerance. He will benefit from further OT services to maximize his independence with ADLs and to decrease the risk for restricted participation in meaningful activities. Patient will benefit from continued inpatient follow up therapy, <3 hours/day.      If plan is discharge home, recommend the following:   A lot of help with walking and/or transfers;A lot of help with bathing/dressing/bathroom;Assistance with cooking/housework;Help with stairs or ramp for entrance     Functional Status Assessment   Patient has had a recent decline in their functional status and demonstrates the ability to make significant improvements in function in a reasonable and predictable amount of time.     Equipment Recommendations   Other (comment) (to be determined pending functional progress)     Recommendations for Other Services         Precautions/Restrictions   Precautions Precautions: Fall Restrictions Weight Bearing Restrictions Per Provider Order: No     Mobility Bed Mobility               General bed mobility comments: pt was received seated in chair    Transfers Overall transfer level: Needs assistance Equipment used: Rolling walker (2  wheels) Transfers: Sit to/from Stand Sit to Stand: Mod assist                  Balance       Sitting balance - Comments: static sitting-good. dynamic sitting-fair+     Standing balance-Leahy Scale: Poor           ADL either performed or assessed with clinical judgement   ADL Overall ADL's : Needs assistance/impaired Eating/Feeding: Independent;Sitting   Grooming: Set up;Sitting           Upper Body Dressing : Set up;Contact guard assist;Sitting   Lower Body Dressing: Moderate assistance;Sitting/lateral leans       Toileting- Clothing Manipulation and Hygiene: Moderate assistance;Maximal assistance Toileting - Clothing Manipulation Details (indicate cue type and reason): at bedside commode level, based on clinical judgement             Vision   Additional Comments: He correctly read the time depicted on the wall clock.            Pertinent Vitals/Pain Pain Assessment Pain Assessment: 0-10 Pain Score: 6  Pain Location: distal BLE Pain Intervention(s): Limited activity within patient's tolerance, Monitored during session     Extremity/Trunk Assessment Upper Extremity Assessment Upper Extremity Assessment: Overall WFL for tasks assessed;Right hand dominant   Lower Extremity Assessment Lower Extremity Assessment: LLE deficits/detail;RLE deficits/detail RLE Deficits / Details:  (AROM WFL) LLE Deficits / Details:  (AROM WFL)       Communication Communication Communication: No apparent difficulties   Cognition Arousal: Alert Behavior During Therapy: WFL for tasks assessed/performed  OT - Cognition Comments: Oriented x4                 Following commands: Intact       Cueing  General Comments   Cueing Techniques: Verbal cues              Home Living Family/patient expects to be discharged to:: Private residence Living Arrangements: Alone Available Help at Discharge: Family Type of Home: House Home  Access: Stairs to enter [employer] of Steps: 2   Home Layout: One level     Bathroom Shower/Tub: Tub/shower unit         Home Equipment: Pharmacist, hospital (2 wheels);Cane - single point          Prior Functioning/Environment Prior Level of Function : Independent/Modified Independent;Driving             Mobility Comments: Walks with cane or RW inside the home; cane used outside the home ADLs Comments: Performs own ADLs and light meal prep, or orders out. He drives, however not frequently.     OT Problem List: Decreased strength;Decreased activity tolerance;Impaired balance (sitting and/or standing);Decreased knowledge of use of DME or AE;Pain;Increased edema   OT Treatment/Interventions: Self-care/ADL training;Therapeutic exercise;Therapeutic activities;Energy conservation;DME and/or AE instruction;Patient/family education;Balance training      OT Goals(Current goals can be found in the care plan section)   Acute Rehab OT Goals Patient Stated Goal: to return to prior level of functioning OT Goal Formulation: With patient Time For Goal Achievement: 01/31/24 Potential to Achieve Goals: Good ADL Goals Pt Will Perform Lower Body Dressing: with supervision;sitting/lateral leans;sit to/from stand Pt Will Transfer to Toilet: with supervision;ambulating Pt Will Perform Toileting - Clothing Manipulation and hygiene: with supervision;sit to/from stand   OT Frequency:  Min 2X/week       AM-PAC OT 6 Clicks Daily Activity     Outcome Measure Help from another person eating meals?: None Help from another person taking care of personal grooming?: A Little Help from another person toileting, which includes using toliet, bedpan, or urinal?: A Lot Help from another person bathing (including washing, rinsing, drying)?: A Lot Help from another person to put on and taking off regular upper body clothing?: A Little Help from another person to put on and taking  off regular lower body clothing?: A Lot 6 Click Score: 16   End of Session Equipment Utilized During Treatment: Gait belt;Rolling walker (2 wheels) Nurse Communication: Mobility status  Activity Tolerance: Patient limited by pain Patient left: in chair;with call bell/phone within reach;with chair alarm set  OT Visit Diagnosis: Unsteadiness on feet (R26.81);Other abnormalities of gait and mobility (R26.89);Muscle weakness (generalized) (M62.81);Pain Pain - part of body:  (distal BLE)                Time: 1414-1430 OT Time Calculation (min): 16 min Charges:  OT General Charges $OT Visit: 1 Visit OT Evaluation $OT Eval Moderate Complexity: 1 Mod  Delanna JINNY Lesches, OTR/L 01/17/2024, 4:42 PM

## 2024-01-17 NOTE — Progress Notes (Signed)
 Progress Note    Isaac Phelps   FMW:[id number]  DOB: March 31, 1931  DOA: 01/13/2024     0 PCP: Merna Huxley, NP  Initial CC: LE swelling/pain and uncontrolled BP  Hospital Course: Isaac Phelps is a 89 y.o. male with medical history significant for poorly controlled hypertension due to noncompliance, GERD, hyperlipidemia, grade 1 diastolic dysfunction being admitted to the hospital with complaints of lower extremity edema and found to have poorly controlled hypertension.    Notably, patient was admitted to the hospital in February with asymptomatic systolic blood pressure in the 220s after having run out of his blood pressure medications and not obtaining refills.  He has chronic lower extremity edema, something he also complained of at that time.   During that hospitalization, he was started on amlodipine , hydralazine  and Coreg .  He complained of worsening LE pain and swelling on this admission to the hospital.  He had a recent LE ultrasound that showed significant venus reflux/insufficiency and plans are for referral to vein specialist.   Collateral information obtained from his daughter who also helps with his meds confirms that he often refuses his BP meds at home, notably lasix  due to the increased urination.   He was admitted for further BP control and LE wound care.  Assessment/Plan   Hypertensive urgency - resolved  HTN -without chest pain, dizziness, headache, confusion or other concerning symptoms.   - etiology suspected due to noncompliance at home; collateral obtained from daughter, especially with lasix  - continue amlodipine  - increased imdur  further; so far good response   Physical deconditioning Fall - night of 8/13 patient lowered himself to the ground after having a dream.  Denied any trauma and recalled the events well - Evaluated by PT with recommendations for SNF; he is now agreeable with going  Venous insufficiency LE edema - patient adamantly refusing care  at home with daughter and also avoiding bathing/cleaning due to pain - have instructed him, it's prudent to continue with wound care; may need to pre-medicate if necessary - evaluated by wound care; follow wound care rec's - HHRN to be arranged if able in order to help continue care and inspection after discharge   Acute on chronic dCHF - likely in setting of uncontrolled HTN; also not fully compliant on lasix  at home - LE edema in setting of venous insufficiency and possibly exac - BNP likely appropriate in setting of underlying CKD but cannot rule out some CHF exac - start on IV lasix  and monitor response; BMP in am also  Acute kidney injury superimposed on CKD stage IIIb -baseline creatinine roughly 2, currently elevated possibly due to chronically uncontrolled hypertension.  Cellulitis ruled out - d/c abx   Hyperlipidemia-continue home statin  Interval History:  No evidence overnight.  Sitting in recliner comfortably this morning.  No chest pain or shortness of breath. Now amenable with going to SNF.   Old records reviewed in assessment of this patient  Antimicrobials:   DVT prophylaxis:  heparin  injection 5,000 Units Start: 01/13/24 2200   Code Status:   Code Status: Full Code  Mobility Assessment (Last 72 Hours)     Mobility Assessment     Row Name 01/17/24 0800 01/16/24 2000 01/16/24 1319 01/16/24 0800 01/15/24 2000   Does the patient have exclusion criteria? No - Perform mobility assessment No - Perform mobility assessment -- No - Perform mobility assessment No - Perform mobility assessment   What is the highest level of mobility based on the mobility  assessment? Level 3 (Stands with assistance) - Balance while standing  and cannot march in place Level 3 (Stands with assistance) - Balance while standing  and cannot march in place Level 3 (Stands with assistance) - Balance while standing  and cannot march in place Level 3 (Stands with assistance) - Balance while  standing  and cannot march in place Level 3 (Stands with assistance) - Balance while standing  and cannot march in place   Is the above level different from baseline mobility prior to current illness? Yes - Recommend PT order Yes - Recommend PT order -- Yes - Recommend PT order Yes - Recommend PT order    Row Name 01/15/24 1200 01/14/24 2000         Does the patient have exclusion criteria? No - Perform mobility assessment Yes- Hold (Level 0) - Assessment complete      What is the highest level of mobility based on the mobility assessment? Level 2 (Chairfast) - Balance while sitting on edge of bed and cannot stand Level 2 (Chairfast) - Balance while sitting on edge of bed and cannot stand      Is the above level different from baseline mobility prior to current illness? Yes - Recommend PT order No - Consider discontinuing PT/OT         Barriers to discharge: none Disposition Plan:  Home HH orders placed: RN Status is: Inpatient  Objective: Blood pressure (!) 154/52, pulse (!) 52, temperature 98.6 F (37 C), temperature source Oral, resp. rate (!) 26, height 6' (1.829 m), weight 84.5 kg, SpO2 95%.  Examination:  Physical Exam Constitutional:      General: He is not in acute distress.    Appearance: Normal appearance.  HENT:     Head: Normocephalic and atraumatic.     Mouth/Throat:     Mouth: Mucous membranes are moist.  Eyes:     Extraocular Movements: Extraocular movements intact.  Cardiovascular:     Rate and Rhythm: Normal rate and regular rhythm.  Pulmonary:     Effort: Pulmonary effort is normal. No respiratory distress.     Breath sounds: Normal breath sounds. No wheezing.  Abdominal:     General: Bowel sounds are normal. There is no distension.     Palpations: Abdomen is soft.     Tenderness: There is no abdominal tenderness.  Musculoskeletal:        General: Normal range of motion.     Cervical back: Normal range of motion and neck supple.     Comments: B/L LE noted  with 2+ edema very TTP with chronic appearing ulcers but no erythema, calor;   Skin:    General: Skin is warm and dry.  Neurological:     General: No focal deficit present.     Mental Status: He is alert.  Psychiatric:        Mood and Affect: Mood normal.        Behavior: Behavior normal.      Consultants:    Procedures:    Data Reviewed: No results found for this or any previous visit (from the past 24 hours).   I have reviewed pertinent nursing notes, vitals, labs, and images as necessary. I have ordered labwork to follow up on as indicated.  I have reviewed the last notes from staff over past 24 hours. I have discussed patient's care plan and test results with nursing staff, CM/SW, and other staff as appropriate.  Time spent: Greater than 50% of the  55 minute visit was spent in counseling/coordination of care for the patient as laid out in the A&P.   LOS: 0 days   Alm Apo, MD [facility] 01/17/2024, 3:37 PM

## 2024-01-18 DIAGNOSIS — I160 Hypertensive urgency: Secondary | ICD-10-CM | POA: Diagnosis not present

## 2024-01-18 LAB — CBC WITH DIFFERENTIAL/PLATELET
Abs Immature Granulocytes: 0.03 K/uL (ref 0.00–0.07)
Basophils Absolute: 0 K/uL (ref 0.0–0.1)
Basophils Relative: 0 %
Eosinophils Absolute: 0.2 K/uL (ref 0.0–0.5)
Eosinophils Relative: 3 %
HCT: 28.8 % — ABNORMAL LOW (ref 39.0–52.0)
Hemoglobin: 8.6 g/dL — ABNORMAL LOW (ref 13.0–17.0)
Immature Granulocytes: 1 %
Lymphocytes Relative: 7 %
Lymphs Abs: 0.4 K/uL — ABNORMAL LOW (ref 0.7–4.0)
MCH: 25 pg — ABNORMAL LOW (ref 26.0–34.0)
MCHC: 29.9 g/dL — ABNORMAL LOW (ref 30.0–36.0)
MCV: 83.7 fL (ref 80.0–100.0)
Monocytes Absolute: 0.6 K/uL (ref 0.1–1.0)
Monocytes Relative: 11 %
Neutro Abs: 4.5 K/uL (ref 1.7–7.7)
Neutrophils Relative %: 78 %
Platelets: 264 K/uL (ref 150–400)
RBC: 3.44 MIL/uL — ABNORMAL LOW (ref 4.22–5.81)
RDW: 14.9 % (ref 11.5–15.5)
WBC: 5.7 K/uL (ref 4.0–10.5)
nRBC: 0 % (ref 0.0–0.2)

## 2024-01-18 LAB — BASIC METABOLIC PANEL WITH GFR
Anion gap: 10 (ref 5–15)
BUN: 42 mg/dL — ABNORMAL HIGH (ref 8–23)
CO2: 23 mmol/L (ref 22–32)
Calcium: 8.2 mg/dL — ABNORMAL LOW (ref 8.9–10.3)
Chloride: 103 mmol/L (ref 98–111)
Creatinine, Ser: 2.09 mg/dL — ABNORMAL HIGH (ref 0.61–1.24)
GFR, Estimated: 30 mL/min — ABNORMAL LOW (ref >60)
Glucose, Bld: 113 mg/dL — ABNORMAL HIGH (ref 70–99)
Potassium: 4.3 mmol/L (ref 3.5–5.1)
Sodium: 136 mmol/L (ref 135–145)

## 2024-01-18 LAB — MAGNESIUM: Magnesium: 2.4 mg/dL (ref 1.7–2.4)

## 2024-01-18 NOTE — Plan of Care (Signed)
  Problem: Education: Goal: Knowledge of General Education information will improve Description: Including pain rating scale, medication(s)/side effects and non-pharmacologic comfort measures Outcome: Progressing   Problem: Health Behavior/Discharge Planning: Goal: Ability to manage health-related needs will improve Outcome: Progressing   Problem: Clinical Measurements: Goal: Ability to maintain clinical measurements within normal limits will improve Outcome: Progressing Goal: Will remain free from infection Outcome: Progressing Goal: Diagnostic test results will improve Outcome: Progressing Goal: Respiratory complications will improve Outcome: Progressing Goal: Cardiovascular complication will be avoided Outcome: Progressing   Problem: Activity: Goal: Risk for activity intolerance will decrease Outcome: Progressing   Problem: Nutrition: Goal: Adequate nutrition will be maintained Outcome: Progressing   Problem: Coping: Goal: Level of anxiety will decrease Outcome: Progressing   Problem: Elimination: Goal: Will not experience complications related to bowel motility Outcome: Progressing Goal: Will not experience complications related to urinary retention Outcome: Progressing   Problem: Pain Managment: Goal: General experience of comfort will improve and/or be controlled Outcome: Progressing   Problem: Safety: Goal: Ability to remain free from injury will improve Outcome: Progressing   Problem: Skin Integrity: Goal: Risk for impaired skin integrity will decrease Outcome: Progressing   Cindy S. Loreli BSN, RN, CCRP, CCRN 01/18/2024 6:09 AM

## 2024-01-18 NOTE — Progress Notes (Signed)
 Progress Note    Isaac Phelps   FMW:[id number]  DOB: [birth year]  DOA: 01/13/2024     1 PCP: Merna Huxley, NP  Initial CC: LE swelling/pain and uncontrolled BP  Hospital Course: Isaac Phelps is a 89 y.o. male with medical history significant for poorly controlled hypertension due to noncompliance, GERD, hyperlipidemia, grade 1 diastolic dysfunction being admitted to the hospital with complaints of lower extremity edema and found to have poorly controlled hypertension.    Notably, patient was admitted to the hospital in February with asymptomatic systolic blood pressure in the 220s after having run out of his blood pressure medications and not obtaining refills.  He has chronic lower extremity edema, something he also complained of at that time.   During that hospitalization, he was started on amlodipine , hydralazine  and Coreg .  He complained of worsening LE pain and swelling on this admission to the hospital.  He had a recent LE ultrasound that showed significant venus reflux/insufficiency and plans are for referral to vein specialist.   Collateral information obtained from his daughter who also helps with his meds confirms that he often refuses his BP meds at home, notably lasix  due to the increased urination.   He was admitted for further BP control and LE wound care.  Assessment/Plan   Hypertensive urgency - resolved  HTN -without chest pain, dizziness, headache, confusion or other concerning symptoms.   - etiology suspected due to noncompliance at home; collateral obtained from daughter, especially with lasix  - continue amlodipine  - increased imdur  further; so far good response   Physical deconditioning Fall - night of 8/13 patient lowered himself to the ground after having a dream.  Denied any trauma and recalled the events well - Evaluated by PT with recommendations for SNF; he is now agreeable with going  Venous insufficiency LE edema - patient adamantly refusing care  at home with daughter and also avoiding bathing/cleaning due to pain - have instructed him, it's prudent to continue with wound care; may need to pre-medicate if necessary - evaluated by wound care; follow wound care rec's - HHRN to be arranged if able in order to help continue care and inspection after discharge   Acute on chronic dCHF - likely in setting of uncontrolled HTN; also not fully compliant on lasix  at home - LE edema in setting of venous insufficiency and possibly exac - BNP likely appropriate in setting of underlying CKD but cannot rule out some CHF exac - start on IV lasix  and monitor response; BMP in am also  Acute kidney injury superimposed on CKD stage IIIb -baseline creatinine roughly 2, currently elevated possibly due to chronically uncontrolled hypertension.  Cellulitis ruled out - d/c abx   Hyperlipidemia-continue home statin  Interval History:  Resting comfortably in bed.  Breathing easily.  Blood pressure a little elevated in the mornings but responds well to morning meds.  Awaiting placement.    Old records reviewed in assessment of this patient  Antimicrobials:   DVT prophylaxis:  heparin  injection 5,000 Units Start: 01/13/24 2200   Code Status:   Code Status: Full Code  Mobility Assessment (Last 72 Hours)     Mobility Assessment     Row Name 01/18/24 0800 01/17/24 1639 01/17/24 0800 01/16/24 2000 01/16/24 1319   Does the patient have exclusion criteria? No - Perform mobility assessment -- No - Perform mobility assessment No - Perform mobility assessment --   What is the highest level of mobility based on the mobility assessment? Level  3 (Stands with assistance) - Balance while standing  and cannot march in place Level 3 (Stands with assistance) - Balance while standing  and cannot march in place Level 3 (Stands with assistance) - Balance while standing  and cannot march in place Level 3 (Stands with assistance) - Balance while standing  and cannot  march in place Level 3 (Stands with assistance) - Balance while standing  and cannot march in place   Is the above level different from baseline mobility prior to current illness? Yes - Recommend PT order -- Yes - Recommend PT order Yes - Recommend PT order --    Row Name 01/16/24 0800 01/15/24 2000         Does the patient have exclusion criteria? No - Perform mobility assessment No - Perform mobility assessment      What is the highest level of mobility based on the mobility assessment? Level 3 (Stands with assistance) - Balance while standing  and cannot march in place Level 3 (Stands with assistance) - Balance while standing  and cannot march in place      Is the above level different from baseline mobility prior to current illness? Yes - Recommend PT order Yes - Recommend PT order         Barriers to discharge: none Disposition Plan:  Home HH orders placed: RN Status is: Inpatient  Objective: Blood pressure (!) 131/48, pulse (!) 55, temperature 98.6 F (37 C), temperature source Oral, resp. rate 18, height 6' (1.829 m), weight 84.5 kg, SpO2 97%.  Examination:  Physical Exam Constitutional:      General: He is not in acute distress.    Appearance: Normal appearance.  HENT:     Head: Normocephalic and atraumatic.     Mouth/Throat:     Mouth: Mucous membranes are moist.  Eyes:     Extraocular Movements: Extraocular movements intact.  Cardiovascular:     Rate and Rhythm: Normal rate and regular rhythm.  Pulmonary:     Effort: Pulmonary effort is normal. No respiratory distress.     Breath sounds: Normal breath sounds. No wheezing.  Abdominal:     General: Bowel sounds are normal. There is no distension.     Palpations: Abdomen is soft.     Tenderness: There is no abdominal tenderness.  Musculoskeletal:        General: Normal range of motion.     Cervical back: Normal range of motion and neck supple.     Comments: B/L LE noted with 2+ edema very TTP with chronic appearing  ulcers but no erythema, calor;   Skin:    General: Skin is warm and dry.  Neurological:     General: No focal deficit present.     Mental Status: He is alert.  Psychiatric:        Mood and Affect: Mood normal.        Behavior: Behavior normal.      Consultants:    Procedures:    Data Reviewed: Results for orders placed or performed during the hospital encounter of 01/13/24 (from the past 24 hours)  Basic metabolic panel with GFR     Status: Abnormal   Collection Time: 01/18/24  2:53 AM  Result Value Ref Range   Sodium 136 135 - 145 mmol/L   Potassium 4.3 3.5 - 5.1 mmol/L   Chloride 103 98 - 111 mmol/L   CO2 23 22 - 32 mmol/L   Glucose, Bld 113 (H) 70 - 99 mg/dL  BUN 42 (H) 8 - 23 mg/dL   Creatinine, Ser 7.90 (H) 0.61 - 1.24 mg/dL   Calcium 8.2 (L) 8.9 - 10.3 mg/dL   GFR, Estimated 30 (L) >60 mL/min   Anion gap 10 5 - 15  CBC with Differential/Platelet     Status: Abnormal   Collection Time: 01/18/24  2:53 AM  Result Value Ref Range   WBC 5.7 4.0 - 10.5 K/uL   RBC 3.44 (L) 4.22 - 5.81 MIL/uL   Hemoglobin 8.6 (L) 13.0 - 17.0 g/dL   HCT 71.1 (L) 60.9 - 47.9 %   MCV 83.7 80.0 - 100.0 fL   MCH 25.0 (L) 26.0 - 34.0 pg   MCHC 29.9 (L) 30.0 - 36.0 g/dL   RDW 85.0 88.4 - 84.4 %   Platelets 264 150 - 400 K/uL   nRBC 0.0 0.0 - 0.2 %   Neutrophils Relative % 78 %   Neutro Abs 4.5 1.7 - 7.7 K/uL   Lymphocytes Relative 7 %   Lymphs Abs 0.4 (L) 0.7 - 4.0 K/uL   Monocytes Relative 11 %   Monocytes Absolute 0.6 0.1 - 1.0 K/uL   Eosinophils Relative 3 %   Eosinophils Absolute 0.2 0.0 - 0.5 K/uL   Basophils Relative 0 %   Basophils Absolute 0.0 0.0 - 0.1 K/uL   Immature Granulocytes 1 %   Abs Immature Granulocytes 0.03 0.00 - 0.07 K/uL  Magnesium     Status: None   Collection Time: 01/18/24  2:53 AM  Result Value Ref Range   Magnesium 2.4 1.7 - 2.4 mg/dL     I have reviewed pertinent nursing notes, vitals, labs, and images as necessary. I have ordered labwork to follow  up on as indicated.  I have reviewed the last notes from staff over past 24 hours. I have discussed patient's care plan and test results with nursing staff, CM/SW, and other staff as appropriate.  Time spent: Greater than 50% of the 55 minute visit was spent in counseling/coordination of care for the patient as laid out in the A&P.   LOS: 1 day   Alm Apo, MD [facility] 01/18/2024, 1:51 PM

## 2024-01-18 NOTE — Plan of Care (Signed)
  Problem: Education: Goal: Knowledge of General Education information will improve Description: Including pain rating scale, medication(s)/side effects and non-pharmacologic comfort measures Outcome: Progressing   Problem: Clinical Measurements: Goal: Respiratory complications will improve Outcome: Progressing   Problem: Nutrition: Goal: Adequate nutrition will be maintained Outcome: Progressing   Problem: Coping: Goal: Level of anxiety will decrease Outcome: Progressing   Problem: Pain Managment: Goal: General experience of comfort will improve and/or be controlled Outcome: Progressing   Problem: Safety: Goal: Ability to remain free from injury will improve Outcome: Progressing

## 2024-01-19 DIAGNOSIS — I160 Hypertensive urgency: Secondary | ICD-10-CM | POA: Diagnosis not present

## 2024-01-19 DIAGNOSIS — I872 Venous insufficiency (chronic) (peripheral): Secondary | ICD-10-CM | POA: Diagnosis not present

## 2024-01-19 DIAGNOSIS — I10 Essential (primary) hypertension: Secondary | ICD-10-CM | POA: Diagnosis not present

## 2024-01-19 LAB — CBC WITH DIFFERENTIAL/PLATELET
Abs Immature Granulocytes: 0.06 K/uL (ref 0.00–0.07)
Basophils Absolute: 0 K/uL (ref 0.0–0.1)
Basophils Relative: 1 %
Eosinophils Absolute: 0.1 K/uL (ref 0.0–0.5)
Eosinophils Relative: 2 %
HCT: 30.6 % — ABNORMAL LOW (ref 39.0–52.0)
Hemoglobin: 9.1 g/dL — ABNORMAL LOW (ref 13.0–17.0)
Immature Granulocytes: 1 %
Lymphocytes Relative: 8 %
Lymphs Abs: 0.6 K/uL — ABNORMAL LOW (ref 0.7–4.0)
MCH: 24.9 pg — ABNORMAL LOW (ref 26.0–34.0)
MCHC: 29.7 g/dL — ABNORMAL LOW (ref 30.0–36.0)
MCV: 83.6 fL (ref 80.0–100.0)
Monocytes Absolute: 0.7 K/uL (ref 0.1–1.0)
Monocytes Relative: 11 %
Neutro Abs: 5.2 K/uL (ref 1.7–7.7)
Neutrophils Relative %: 77 %
Platelets: 280 K/uL (ref 150–400)
RBC: 3.66 MIL/uL — ABNORMAL LOW (ref 4.22–5.81)
RDW: 14.8 % (ref 11.5–15.5)
WBC: 6.6 K/uL (ref 4.0–10.5)
nRBC: 0 % (ref 0.0–0.2)

## 2024-01-19 LAB — BASIC METABOLIC PANEL WITH GFR
Anion gap: 8 (ref 5–15)
BUN: 37 mg/dL — ABNORMAL HIGH (ref 8–23)
CO2: 24 mmol/L (ref 22–32)
Calcium: 8.3 mg/dL — ABNORMAL LOW (ref 8.9–10.3)
Chloride: 101 mmol/L (ref 98–111)
Creatinine, Ser: 1.99 mg/dL — ABNORMAL HIGH (ref 0.61–1.24)
GFR, Estimated: 31 mL/min — ABNORMAL LOW (ref >60)
Glucose, Bld: 125 mg/dL — ABNORMAL HIGH (ref 70–99)
Potassium: 4.4 mmol/L (ref 3.5–5.1)
Sodium: 133 mmol/L — ABNORMAL LOW (ref 135–145)

## 2024-01-19 LAB — MAGNESIUM: Magnesium: 2.2 mg/dL (ref 1.7–2.4)

## 2024-01-19 LAB — GLUCOSE, CAPILLARY: Glucose-Capillary: 125 mg/dL — ABNORMAL HIGH (ref 70–99)

## 2024-01-19 NOTE — TOC Progression Note (Signed)
 Transition of Care [facility]) - Progression Note    Patient Details  Name: Isaac Phelps MRN: [id number] Date of Birth: 1930/12/07  Transition of [facility]) CM/SW Contact  Sheri ONEIDA Shar[place], [place] Phone Number: 01/19/2024, 2:42 PM  Clinical Narrative:    CSW spoke with pt dtr. Bed choice is [facility]. Pt trad mcr, no ins auth needed, just 3 midnight stay.   Expected Discharge Plan: Home w Home Health Services Barriers to Discharge: Continued Medical Work up               Expected Discharge Plan and Services In-house Referral: NA Discharge Planning Services: CM Consult Post Acute Care Choice: Durable Medical Equipment, Home Health Living arrangements for the past 2 months: Single Family Home                 DME Arranged: N/A DME Agency: NA       HH Arranged: RN HH AgencyHotel manager Home Health Care Date HH Agency Contacted: 01/14/24 Time HH Agency Contacted: 1312 Representative spoke with at [facility] Agency: Cindie Sillmon   Social Drivers of Health (SDOH) Interventions SDOH Screenings   Food Insecurity: No Food Insecurity (01/13/2024)  Housing: Low Risk  (01/13/2024)  Transportation Needs: No Transportation Needs (01/13/2024)  Utilities: Not At Risk (01/13/2024)  Alcohol  Screen: Low Risk  (11/21/2023)  Depression (PHQ2-9): Low Risk  (11/21/2023)  Financial Resource Strain: Low Risk  (11/21/2023)  Physical Activity: Inactive (11/21/2023)  Social Connections: Socially Isolated (01/13/2024)  Stress: No Stress Concern Present (11/21/2023)  Tobacco Use: Medium Risk (01/13/2024)  Health Literacy: Inadequate Health Literacy (11/21/2023)    Readmission Risk Interventions     No data to display

## 2024-01-19 NOTE — Progress Notes (Signed)
 Progress Note    Isaac Phelps   FMW:[id number]  DOB: [birth year]  DOA: 01/13/2024     2 PCP: Merna Huxley, NP  Initial CC: LE swelling/pain and uncontrolled BP  Hospital Course: Isaac Phelps is a 89 y.o. male with medical history significant for poorly controlled hypertension due to noncompliance, GERD, hyperlipidemia, grade 1 diastolic dysfunction being admitted to the hospital with complaints of lower extremity edema and found to have poorly controlled hypertension.    Notably, patient was admitted to the hospital in February with asymptomatic systolic blood pressure in the 220s after having run out of his blood pressure medications and not obtaining refills.  He has chronic lower extremity edema, something he also complained of at that time.   During that hospitalization, he was started on amlodipine , hydralazine  and Coreg .  He complained of worsening LE pain and swelling on this admission to the hospital.  He had a recent LE ultrasound that showed significant venus reflux/insufficiency and plans are for referral to vein specialist.   Collateral information obtained from his daughter who also helps with his meds confirms that he often refuses his BP meds at home, notably lasix  due to the increased urination.   He was admitted for further BP control and LE wound care.  Assessment/Plan   Hypertensive urgency - resolved  HTN -without chest pain, dizziness, headache, confusion or other concerning symptoms.   - etiology suspected due to noncompliance at home; collateral obtained from daughter, especially with lasix  - continue amlodipine  - increased imdur  further; so far good response   Physical deconditioning Fall - night of 8/13 patient lowered himself to the ground after having a dream.  Denied any trauma and recalled the events well - Evaluated by PT with recommendations for SNF; he is now agreeable with going  Venous insufficiency LE edema - patient adamantly refusing care  at home with daughter and also avoiding bathing/cleaning due to pain - have instructed him, it's prudent to continue with wound care; may need to pre-medicate if necessary - evaluated by wound care; follow wound care rec's - HHRN to be arranged if able in order to help continue care and inspection after discharge   Acute on chronic dCHF - likely in setting of uncontrolled HTN; also not fully compliant on lasix  at home - LE edema in setting of venous insufficiency and possibly exac - BNP likely appropriate in setting of underlying CKD but cannot rule out some CHF exac - start on IV lasix  and monitor response; BMP in am also  Acute kidney injury superimposed on CKD stage IIIb -baseline creatinine roughly 2, currently elevated possibly due to chronically uncontrolled hypertension.  Cellulitis ruled out - d/c abx   Hyperlipidemia-continue home statin  Interval History:  Continues to tolerate dressing changes well.  Awaiting placement.  Stable otherwise.   Old records reviewed in assessment of this patient  Antimicrobials:   DVT prophylaxis:  heparin  injection 5,000 Units Start: 01/13/24 2200   Code Status:   Code Status: Full Code  Mobility Assessment (Last 72 Hours)     Mobility Assessment     Row Name 01/19/24 1038 01/18/24 2015 01/18/24 0800 01/17/24 1639 01/17/24 0800   Does the patient have exclusion criteria? No - Perform mobility assessment No - Perform mobility assessment No - Perform mobility assessment -- No - Perform mobility assessment   What is the highest level of mobility based on the mobility assessment? Level 3 (Stands with assistance) - Balance while standing  and  cannot march in place Level 3 (Stands with assistance) - Balance while standing  and cannot march in place Level 3 (Stands with assistance) - Balance while standing  and cannot march in place Level 3 (Stands with assistance) - Balance while standing  and cannot march in place Level 3 (Stands with  assistance) - Balance while standing  and cannot march in place   Is the above level different from baseline mobility prior to current illness? Yes - Recommend PT order Yes - Recommend PT order Yes - Recommend PT order -- Yes - Recommend PT order    Row Name 01/16/24 2000           Does the patient have exclusion criteria? No - Perform mobility assessment       What is the highest level of mobility based on the mobility assessment? Level 3 (Stands with assistance) - Balance while standing  and cannot march in place       Is the above level different from baseline mobility prior to current illness? Yes - Recommend PT order          Barriers to discharge: none Disposition Plan:  Home HH orders placed: RN Status is: Inpatient  Objective: Blood pressure (!) 161/54, pulse (!) 56, temperature 97.8 F (36.6 C), temperature source Oral, resp. rate 16, height 6' (1.829 m), weight 84.5 kg, SpO2 95%.  Examination:  Physical Exam Constitutional:      General: He is not in acute distress.    Appearance: Normal appearance.  HENT:     Head: Normocephalic and atraumatic.     Mouth/Throat:     Mouth: Mucous membranes are moist.  Eyes:     Extraocular Movements: Extraocular movements intact.  Cardiovascular:     Rate and Rhythm: Normal rate and regular rhythm.  Pulmonary:     Effort: Pulmonary effort is normal. No respiratory distress.     Breath sounds: Normal breath sounds. No wheezing.  Abdominal:     General: Bowel sounds are normal. There is no distension.     Palpations: Abdomen is soft.     Tenderness: There is no abdominal tenderness.  Musculoskeletal:        General: Normal range of motion.     Cervical back: Normal range of motion and neck supple.     Comments: B/L LE noted with 2+ edema very TTP with chronic appearing ulcers but no erythema, calor;   Skin:    General: Skin is warm and dry.  Neurological:     General: No focal deficit present.     Mental Status: He is alert.   Psychiatric:        Mood and Affect: Mood normal.        Behavior: Behavior normal.      Consultants:    Procedures:    Data Reviewed: Results for orders placed or performed during the hospital encounter of 01/13/24 (from the past 24 hours)  Basic metabolic panel with GFR     Status: Abnormal   Collection Time: 01/19/24  7:13 AM  Result Value Ref Range   Sodium 133 (L) 135 - 145 mmol/L   Potassium 4.4 3.5 - 5.1 mmol/L   Chloride 101 98 - 111 mmol/L   CO2 24 22 - 32 mmol/L   Glucose, Bld 125 (H) 70 - 99 mg/dL   BUN 37 (H) 8 - 23 mg/dL   Creatinine, Ser 8.00 (H) 0.61 - 1.24 mg/dL   Calcium 8.3 (L) 8.9 - 10.3  mg/dL   GFR, Estimated 31 (L) >60 mL/min   Anion gap 8 5 - 15  CBC with Differential/Platelet     Status: Abnormal   Collection Time: 01/19/24  7:13 AM  Result Value Ref Range   WBC 6.6 4.0 - 10.5 K/uL   RBC 3.66 (L) 4.22 - 5.81 MIL/uL   Hemoglobin 9.1 (L) 13.0 - 17.0 g/dL   HCT 69.3 (L) 60.9 - 47.9 %   MCV 83.6 80.0 - 100.0 fL   MCH 24.9 (L) 26.0 - 34.0 pg   MCHC 29.7 (L) 30.0 - 36.0 g/dL   RDW 85.1 88.4 - 84.4 %   Platelets 280 150 - 400 K/uL   nRBC 0.0 0.0 - 0.2 %   Neutrophils Relative % 77 %   Neutro Abs 5.2 1.7 - 7.7 K/uL   Lymphocytes Relative 8 %   Lymphs Abs 0.6 (L) 0.7 - 4.0 K/uL   Monocytes Relative 11 %   Monocytes Absolute 0.7 0.1 - 1.0 K/uL   Eosinophils Relative 2 %   Eosinophils Absolute 0.1 0.0 - 0.5 K/uL   Basophils Relative 1 %   Basophils Absolute 0.0 0.0 - 0.1 K/uL   Immature Granulocytes 1 %   Abs Immature Granulocytes 0.06 0.00 - 0.07 K/uL  Magnesium     Status: None   Collection Time: 01/19/24  7:13 AM  Result Value Ref Range   Magnesium 2.2 1.7 - 2.4 mg/dL     I have reviewed pertinent nursing notes, vitals, labs, and images as necessary. I have ordered labwork to follow up on as indicated.  I have reviewed the last notes from staff over past 24 hours. I have discussed patient's care plan and test results with nursing staff,  CM/SW, and other staff as appropriate.  Time spent: Greater than 50% of the 55 minute visit was spent in counseling/coordination of care for the patient as laid out in the A&P.   LOS: 2 days   Alm Apo, MD [facility] 01/19/2024, 1:57 PM

## 2024-01-19 NOTE — Plan of Care (Signed)
  Problem: Education: Goal: Knowledge of General Education information will improve Description: Including pain rating scale, medication(s)/side effects and non-pharmacologic comfort measures Outcome: Progressing   Problem: Health Behavior/Discharge Planning: Goal: Ability to manage health-related needs will improve Outcome: Progressing   Problem: Clinical Measurements: Goal: Ability to maintain clinical measurements within normal limits will improve Outcome: Progressing Goal: Will remain free from infection Outcome: Progressing Goal: Diagnostic test results will improve Outcome: Progressing   Problem: Activity: Goal: Risk for activity intolerance will decrease Outcome: Progressing   Problem: Pain Managment: Goal: General experience of comfort will improve and/or be controlled Outcome: Progressing   Problem: Safety: Goal: Ability to remain free from injury will improve Outcome: Progressing   Problem: Skin Integrity: Goal: Risk for impaired skin integrity will decrease Outcome: Progressing   Problem: Education: Goal: Knowledge of General Education information will improve Description: Including pain rating scale, medication(s)/side effects and non-pharmacologic comfort measures Outcome: Progressing   Problem: Health Behavior/Discharge Planning: Goal: Ability to manage health-related needs will improve Outcome: Progressing   Problem: Clinical Measurements: Goal: Ability to maintain clinical measurements within normal limits will improve Outcome: Progressing Goal: Will remain free from infection Outcome: Progressing Goal: Diagnostic test results will improve Outcome: Progressing   Problem: Activity: Goal: Risk for activity intolerance will decrease Outcome: Progressing   Problem: Pain Managment: Goal: General experience of comfort will improve and/or be controlled Outcome: Progressing   Problem: Safety: Goal: Ability to remain free from injury will  improve Outcome: Progressing   Problem: Skin Integrity: Goal: Risk for impaired skin integrity will decrease Outcome: Progressing

## 2024-01-20 DIAGNOSIS — I872 Venous insufficiency (chronic) (peripheral): Secondary | ICD-10-CM | POA: Diagnosis not present

## 2024-01-20 DIAGNOSIS — I160 Hypertensive urgency: Secondary | ICD-10-CM | POA: Diagnosis not present

## 2024-01-20 LAB — BASIC METABOLIC PANEL WITH GFR
Anion gap: 8 (ref 5–15)
BUN: 35 mg/dL — ABNORMAL HIGH (ref 8–23)
CO2: 23 mmol/L (ref 22–32)
Calcium: 8.4 mg/dL — ABNORMAL LOW (ref 8.9–10.3)
Chloride: 104 mmol/L (ref 98–111)
Creatinine, Ser: 1.95 mg/dL — ABNORMAL HIGH (ref 0.61–1.24)
GFR, Estimated: 32 mL/min — ABNORMAL LOW (ref >60)
Glucose, Bld: 109 mg/dL — ABNORMAL HIGH (ref 70–99)
Potassium: 4.8 mmol/L (ref 3.5–5.1)
Sodium: 135 mmol/L (ref 135–145)

## 2024-01-20 LAB — CBC WITH DIFFERENTIAL/PLATELET
Abs Immature Granulocytes: 0.05 K/uL (ref 0.00–0.07)
Basophils Absolute: 0 K/uL (ref 0.0–0.1)
Basophils Relative: 1 %
Eosinophils Absolute: 0.1 K/uL (ref 0.0–0.5)
Eosinophils Relative: 3 %
HCT: 29.2 % — ABNORMAL LOW (ref 39.0–52.0)
Hemoglobin: 8.9 g/dL — ABNORMAL LOW (ref 13.0–17.0)
Immature Granulocytes: 1 %
Lymphocytes Relative: 8 %
Lymphs Abs: 0.4 K/uL — ABNORMAL LOW (ref 0.7–4.0)
MCH: 25.5 pg — ABNORMAL LOW (ref 26.0–34.0)
MCHC: 30.5 g/dL (ref 30.0–36.0)
MCV: 83.7 fL (ref 80.0–100.0)
Monocytes Absolute: 0.7 K/uL (ref 0.1–1.0)
Monocytes Relative: 12 %
Neutro Abs: 4.1 K/uL (ref 1.7–7.7)
Neutrophils Relative %: 75 %
Platelets: 275 K/uL (ref 150–400)
RBC: 3.49 MIL/uL — ABNORMAL LOW (ref 4.22–5.81)
RDW: 14.8 % (ref 11.5–15.5)
WBC: 5.4 K/uL (ref 4.0–10.5)
nRBC: 0 % (ref 0.0–0.2)

## 2024-01-20 LAB — MAGNESIUM: Magnesium: 2.2 mg/dL (ref 1.7–2.4)

## 2024-01-20 MED ORDER — OXYCODONE HCL 5 MG PO TABS: 5.0000 mg | ORAL_TABLET | Freq: Every day | ORAL | 0 refills | Status: AC | PRN

## 2024-01-20 MED ORDER — TRAZODONE HCL 50 MG PO TABS: 25.0000 mg | ORAL_TABLET | Freq: Every evening | ORAL | Status: DC | PRN

## 2024-01-20 MED ORDER — ISOSORBIDE MONONITRATE ER 120 MG PO TB24: 120.0000 mg | ORAL_TABLET | Freq: Every day | ORAL | Status: DC

## 2024-01-20 NOTE — Progress Notes (Signed)
 PT Cancellation Note  Patient Details Name: Isaac Phelps MRN: [id number] DOB: 03/21/1931   Cancelled Treatment:    Reason Eval/Treat Not Completed: Other (comment). Pt having BLE dressings re-wrapped and agreeable to therapy after. Therapist returns to room per pt request and pt declines PT due to BLE soreness from dressing changes. Noted pt d/c to SNF today. Will continue to follow as schedule permits.    Metta Ave PT, DPT 01/20/24, 12:41 PM

## 2024-01-20 NOTE — TOC Transition Note (Signed)
 Transition of Care [facility]) - Discharge Note   Patient Details  Name: Isaac Phelps MRN: [id number] Date of Birth: 01/31/1931  Transition of Care [facility]) CM/SW Contact:  Toy LITTIE Agar, RN Phone [phone]  01/20/2024, 1:23 PM   Clinical Narrative:    Patient discharging to [facility]. CM has confirmed that bed is available. Discharge summary and transfer report have been faxed to facility. Daughter Macario has been notified about discharge. Transportation has been arranged  per PTAR.  Nursing please call report to: [facility] Room # [phone] [name] [phone]    Final next level of care: Skilled Nursing Facility Barriers to Discharge: No Barriers Identified   Patient Goals and CMS Choice Patient states their goals for this hospitalization and ongoing recovery are:: To go to [facility] CMS Medicare.gov Compare Post Acute Care list provided to:: Patient Choice offered to / list presented to : Patient [place] ownership interest in [facility].provided to:: Patient    Discharge Placement              Patient chooses bed at: [facility] Patient to be transferred to facility by: [facility] Name of family member notified: Macario Dayhoff daughter [phone] Patient and family notified of of transfer: 01/20/24  Discharge Plan and Services Additional resources added to the After Visit Summary for   In-house Referral: NA Discharge Planning Services: CM Consult Post Acute Care Choice: Durable Medical Equipment, Home Health          DME Arranged: N/A DME Agency: NA       HH Arranged: NA HH Agency: NA Date HH Agency Contacted: 01/14/24 Time HH Agency Contacted: 1312 Representative spoke with at [facility] Agency: Cindie Sillmon  Social Drivers of Health (SDOH) Interventions SDOH Screenings   Food Insecurity: No Food Insecurity (01/13/2024)  Housing: Low Risk  (01/13/2024)  Transportation Needs: No Transportation Needs (01/13/2024)  Utilities: Not At Risk  (01/13/2024)  Alcohol  Screen: Low Risk  (11/21/2023)  Depression (PHQ2-9): Low Risk  (11/21/2023)  Financial Resource Strain: Low Risk  (11/21/2023)  Physical Activity: Inactive (11/21/2023)  Social Connections: Socially Isolated (01/13/2024)  Stress: No Stress Concern Present (11/21/2023)  Tobacco Use: Medium Risk (01/13/2024)  Health Literacy: Inadequate Health Literacy (11/21/2023)     Readmission Risk Interventions     No data to display

## 2024-01-20 NOTE — Progress Notes (Signed)
 Dressing changes completed at this time to bilateral lower extremities.

## 2024-01-20 NOTE — Progress Notes (Signed)
 Report called to [name] at this time per this nurse regarding admission to [facility].

## 2024-01-20 NOTE — Discharge Summary (Signed)
 Physician Discharge Summary   Isaac Phelps FMW:[id number] DOB: [birth year] DOA: 01/13/2024  PCP: Merna Huxley, NP  Admit date: 01/13/2024 Discharge date: 01/20/2024   Admitted From: Home Disposition:  [place] Discharging physician: Alm Apo, MD Barriers to discharge: none  Recommendations at discharge: If SOB or worsening LE edema, may need to resume lasix  If BP uptrends again, may need to resume on hydralazine     Discharge Condition: stable CODE STATUS: FULL  Diet recommendation:  Diet Orders (From admission, onward)     Start     Ordered   01/20/24 0000  Diet - low sodium heart healthy        01/20/24 1144   01/13/24 1704  Diet Heart Room service appropriate? Yes; Fluid consistency: Thin; Fluid restriction: 2000 mL Fluid  Diet effective now       Question Answer Comment  Room service appropriate? Yes   Fluid consistency: Thin   Fluid restriction: 2000 mL Fluid      01/13/24 1703            Hospital Course: Isaac Phelps is a 89 y.o. male with medical history significant for poorly controlled hypertension due to noncompliance, GERD, hyperlipidemia, grade 1 diastolic dysfunction being admitted to the hospital with complaints of lower extremity edema and found to have poorly controlled hypertension.    Notably, patient was admitted to the hospital in February with asymptomatic systolic blood pressure in the 220s after having run out of his blood pressure medications and not obtaining refills.  He has chronic lower extremity edema, something he also complained of at that time.   During that hospitalization, he was started on amlodipine , hydralazine  and Coreg .  He complained of worsening LE pain and swelling on this admission to the hospital.  He had a recent LE ultrasound that showed significant venus reflux/insufficiency and plans are for referral to vein specialist.   Collateral information obtained from his daughter who also helps with his meds confirms that  he often refuses his BP meds at home, notably lasix  due to the increased urination.   He was admitted for further BP control and LE wound care.  Assessment/Plan   Hypertensive urgency - resolved  HTN -without chest pain, dizziness, headache, confusion or other concerning symptoms.   - etiology suspected due to noncompliance at home; collateral obtained from daughter, especially with lasix  - continue amlodipine  - increased imdur  further; so far good response   Physical deconditioning Fall - night of 8/13 patient lowered himself to the ground after having a dream.  Denied any trauma and recalled the events well - Evaluated by PT with recommendations for SNF; he is now agreeable with going  Venous insufficiency LE edema - patient adamantly refusing care at home with daughter and also avoiding bathing/cleaning due to pain - have instructed him, it's prudent to continue with wound care; may need to pre-medicate if necessary - evaluated by wound care; follow wound care rec's - HHRN to be arranged if able in order to help continue care and inspection after discharge   Acute on chronic dCHF - likely in setting of uncontrolled HTN; also not fully compliant on lasix  at home - LE edema in setting of venous insufficiency and possibly exac - BNP likely appropriate in setting of underlying CKD but cannot rule out some CHF exac - start on IV lasix  and monitor response; BMP in am also  Acute kidney injury superimposed on CKD stage IIIb -baseline creatinine roughly 2, currently elevated possibly due to  chronically uncontrolled hypertension.  Cellulitis ruled out - d/c abx   Hyperlipidemia-continue home statin   Principal Diagnosis: Hypertensive urgency  Discharge Diagnoses: Active Hospital Problems   Diagnosis Date Noted   Chronic venous insufficiency 01/13/2024    Priority: 2.   Essential hypertension 02/07/2007    Priority: 4.   Impaired mobility 07/19/2023    Priority: 5.    Hypertensive urgency 01/17/2024   History of medication noncompliance 01/14/2024    Resolved Hospital Problems   Diagnosis Date Noted Date Resolved   Hypertensive urgency  01/14/2024    Priority: 1.     Discharge Instructions     Diet - low sodium heart healthy   Complete by: As directed    Discharge wound care:   Complete by: As directed    1) Cleanse lower legs (intact skin and wounds) with Vashe wound cleanser do not rinse and allow to air dry. Apply Xeroform gauze ([facility] [phone]) to wound beds daily, cover with ABD pads and secure with Kerlix roll gauze beginning right above toes and ending right below knees.  Apply Ace bandage in same fashion as Kerlix to secure dressing. 2) Paint in between 2nd and 3rd digits R foot and callus to Left foot 3rd digit with Betadine 2 times daily, may cover with silicone foam if desired or leave open to air   Increase activity slowly   Complete by: As directed       Allergies as of 01/20/2024   No Known Allergies      Medication List     STOP taking these medications    furosemide  40 MG tablet Commonly known as: LASIX    hydrALAZINE  100 MG tablet Commonly known as: APRESOLINE    nebivolol  10 MG tablet Commonly known as: BYSTOLIC        TAKE these medications    acetaminophen  650 MG CR tablet Commonly known as: TYLENOL  Take 1 tablet (650 mg total) by mouth daily as needed for pain.   amLODipine  10 MG tablet Commonly known as: NORVASC  Take 1 tablet (10 mg total) by mouth daily.   aspirin  81 MG chewable tablet Chew 81 mg by mouth daily.   isosorbide  mononitrate 120 MG 24 hr tablet Commonly known as: IMDUR  Take 1 tablet (120 mg total) by mouth daily. Start taking on: January 21, 2024 What changed:  medication strength how much to take   oxyCODONE  5 MG immediate release tablet Commonly known as: Oxy IR/ROXICODONE  Take 1 tablet (5 mg total) by mouth daily as needed for moderate pain (pain score 4-6) or severe pain (pain score  7-10).   simvastatin  40 MG tablet Commonly known as: ZOCOR  TAKE 1 TABLET(40 MG) BY MOUTH DAILY What changed: See the new instructions.   traZODone  50 MG tablet Commonly known as: DESYREL  Take 0.5 tablets (25 mg total) by mouth at bedtime as needed for sleep.               Discharge Care Instructions  (From admission, onward)           Start     Ordered   01/20/24 0000  Discharge wound care:       Comments: 1) Cleanse lower legs (intact skin and wounds) with Vashe wound cleanser do not rinse and allow to air dry. Apply Xeroform gauze ([facility] [phone]) to wound beds daily, cover with ABD pads and secure with Kerlix roll gauze beginning right above toes and ending right below knees.  Apply Ace bandage in same fashion as Kerlix to  secure dressing. 2) Paint in between 2nd and 3rd digits R foot and callus to Left foot 3rd digit with Betadine 2 times daily, may cover with silicone foam if desired or leave open to air   01/20/24 1144            Contact information for follow-up providers     Care, [facility] Follow up.   Specialty: Home Health Services Why: Once you are home from the hospital, someone from [facility] will call you to start your Home Health Nursing services. Contact information: [address] [place] [address] [phone]              Contact information for after-discharge care     Destination     [facility]   Service: Skilled Nursing Contact information: [address] [place]  [phone]                    No Known Allergies    Discharge Exam: BP (!) 149/62 (BP Location: Left Arm)   Pulse (!) 52   Temp 98.6 F (37 C) (Oral)   Resp 18   Ht 6' (1.829 m)   Wt 84.5 kg   SpO2 95%   BMI 25.27 kg/m  Physical Exam Constitutional:      General: He is not in acute distress.    Appearance: Normal appearance.  HENT:     Head:  Normocephalic and atraumatic.     Mouth/Throat:     Mouth: Mucous membranes are moist.  Eyes:     Extraocular Movements: Extraocular movements intact.  Cardiovascular:     Rate and Rhythm: Normal rate and regular rhythm.  Pulmonary:     Effort: Pulmonary effort is normal. No respiratory distress.     Breath sounds: Normal breath sounds. No wheezing.  Abdominal:     General: Bowel sounds are normal. There is no distension.     Palpations: Abdomen is soft.     Tenderness: There is no abdominal tenderness.  Musculoskeletal:        General: Normal range of motion.     Cervical back: Normal range of motion and neck supple.     Right lower leg: Edema (trace) present.     Left lower leg: Edema (trace) present.     Comments: B/L LE noted with 2+ edema very TTP with chronic appearing ulcers but no erythema, calor;   Skin:    General: Skin is warm and dry.  Neurological:     General: No focal deficit present.     Mental Status: He is alert.  Psychiatric:        Mood and Affect: Mood normal.        Behavior: Behavior normal.      The results of significant diagnostics from this hospitalization (including imaging, microbiology, ancillary and laboratory) are listed below for reference.   Microbiology: Recent Results (from the past 240 hours)  MRSA Next Gen by PCR, Nasal     Status: None   Collection Time: 01/13/24  5:40 PM   Specimen: Nasal Mucosa; Nasal Swab  Result Value Ref Range Status   MRSA by PCR Next Gen NOT DETECTED NOT DETECTED Final    Comment: (NOTE) The GeneXpert MRSA Assay (FDA approved for NASAL specimens only), is one component of a comprehensive MRSA colonization surveillance program. It is not intended to diagnose MRSA infection nor to guide or monitor treatment for MRSA infections. Test  performance is not FDA approved in patients less than 67 years old. Performed at [facility], [address] [place], [place] [address]      Labs: BNP  (last 3 results) Recent Labs    07/16/23 1004 01/13/24 1728  BNP 113.2* 166.3*   Basic Metabolic Panel: Recent Labs  Lab 01/13/24 1235 01/14/24 0258 01/18/24 0253 01/19/24 0713 01/20/24 0515  NA 141 138 136 133* 135  K 4.0 4.0 4.3 4.4 4.8  CL 107 108 103 101 104  CO2 22 22 23 24 23   GLUCOSE 86 124* 113* 125* 109*  BUN 30* 31* 42* 37* 35*  CREATININE 2.43* 2.30* 2.09* 1.99* 1.95*  CALCIUM 9.3 8.1* 8.2* 8.3* 8.4*  MG  --   --  2.4 2.2 2.2   Liver Function Tests: Recent Labs  Lab 01/13/24 1235  AST 36  ALT 10  ALKPHOS 117  BILITOT 0.3  PROT 8.0  ALBUMIN 3.8   No results for input(s): LIPASE, AMYLASE in the last 168 hours. No results for input(s): AMMONIA in the last 168 hours. CBC: Recent Labs  Lab 01/13/24 1235 01/14/24 0258 01/18/24 0253 01/19/24 0713 01/20/24 0515  WBC 6.9 6.5 5.7 6.6 5.4  NEUTROABS 5.6  --  4.5 5.2 4.1  HGB 10.1* 9.0* 8.6* 9.1* 8.9*  HCT 32.6* 30.0* 28.8* 30.6* 29.2*  MCV 84.2 85.2 83.7 83.6 83.7  PLT 267 227 264 280 275   Cardiac Enzymes: No results for input(s): CKTOTAL, CKMB, CKMBINDEX, TROPONINI in the last 168 hours. BNP: Invalid input(s): POCBNP CBG: Recent Labs  Lab 01/19/24 1650  GLUCAP 125*   D-Dimer No results for input(s): DDIMER in the last 72 hours. Hgb A1c No results for input(s): HGBA1C in the last 72 hours. Lipid Profile No results for input(s): CHOL, HDL, LDLCALC, TRIG, CHOLHDL, LDLDIRECT in the last 72 hours. Thyroid  function studies No results for input(s): TSH, T4TOTAL, T3FREE, THYROIDAB in the last 72 hours.  Invalid input(s): FREET3 Anemia work up No results for input(s): VITAMINB12, FOLATE, FERRITIN, TIBC, IRON, RETICCTPCT in the last 72 hours. Urinalysis    Component Value Date/Time   COLORURINE YELLOW 01/13/2024 1227   APPEARANCEUR CLEAR 01/13/2024 1227   LABSPEC 1.016 01/13/2024 1227   PHURINE 5.5 01/13/2024 1227   GLUCOSEU NEGATIVE  01/13/2024 1227   GLUCOSEU NEGATIVE 11/01/2021 1134   HGBUR TRACE (A) 01/13/2024 1227   BILIRUBINUR NEGATIVE 01/13/2024 1227   KETONESUR NEGATIVE 01/13/2024 1227   PROTEINUR 30 (A) 01/13/2024 1227   UROBILINOGEN 1.0 11/01/2021 1134   NITRITE NEGATIVE 01/13/2024 1227   LEUKOCYTESUR NEGATIVE 01/13/2024 1227   Sepsis Labs Recent Labs  Lab 01/14/24 0258 01/18/24 0253 01/19/24 0713 01/20/24 0515  WBC 6.5 5.7 6.6 5.4   Microbiology Recent Results (from the past 240 hours)  MRSA Next Gen by PCR, Nasal     Status: None   Collection Time: 01/13/24  5:40 PM   Specimen: Nasal Mucosa; Nasal Swab  Result Value Ref Range Status   MRSA by PCR Next Gen NOT DETECTED NOT DETECTED Final    Comment: (NOTE) The GeneXpert MRSA Assay (FDA approved for NASAL specimens only), is one component of a comprehensive MRSA colonization surveillance program. It is not intended to diagnose MRSA infection nor to guide or monitor treatment for MRSA infections. Test performance is not FDA approved in patients less than 18 years old. Performed at [facility], [address] [place], [place] [address]     Procedures/Studies: DG Chest Port 1  View Result Date: 01/13/2024 CLINICAL DATA:  Shortness of breath. EXAM: PORTABLE CHEST 1 VIEW COMPARISON:  12/19/2023 FINDINGS: The cardio pericardial silhouette is enlarged. There is pulmonary vascular congestion. Diffuse interstitial opacity suggests edema. No dense focal airspace consolidation or substantial pleural effusion. No acute bony abnormality. IMPRESSION: Enlargement of the cardiopericardial silhouette with pulmonary vascular congestion and diffuse interstitial opacity suggesting edema. Electronically Signed   By: Camellia Candle M.D.   On: 01/13/2024 16:17   VAS US  LOWER EXTREMITY VENOUS REFLUX Result Date: 12/27/2023  Lower Venous Reflux Study Patient Name:  MARVON SHILLINGBURG  Date of Exam:   12/27/2023 Medical Rec #: [id number]      Accession #:     [id number] Date of Birth: 09-23-1930      Patient Gender: M Patient Age:   78 years Exam Location:  [address] Procedure:      VAS US  LOWER EXTREMITY VENOUS REFLUX Referring Phys: DARLEENE SHAPE --------------------------------------------------------------------------------  Indications: Edema.  Risk Factors: None identified. Comparison Study: 09/09/18 Performing Technologist: Garnette Rockers  Examination Guidelines: A complete evaluation includes B-mode imaging, spectral Doppler, color Doppler, and power Doppler as needed of all accessible portions of each vessel. Bilateral testing is considered an integral part of a complete examination. Limited examinations for reoccurring indications may be performed as noted. The reflux portion of the exam is performed with the patient in reverse Trendelenburg. Significant venous reflux is defined as >500 ms in the superficial venous system, and >1 second in the deep venous system.  Venous Reflux Times +--------------+---------+------+-----------+------------+--------+ RIGHT         Reflux NoRefluxReflux TimeDiameter cmsComments                         Yes                                  +--------------+---------+------+-----------+------------+--------+ CFV           no                                             +--------------+---------+------+-----------+------------+--------+ FV mid        no                                             +--------------+---------+------+-----------+------------+--------+ Popliteal               yes   >1 second                      +--------------+---------+------+-----------+------------+--------+ GSV at SFJ              yes    >500 ms      0.83             +--------------+---------+------+-----------+------------+--------+ GSV prox thighno                            0.31             +--------------+---------+------+-----------+------------+--------+ GSV mid thigh no  0.17             +--------------+---------+------+-----------+------------+--------+ GSV dist thighno                            0.2              +--------------+---------+------+-----------+------------+--------+ GSV at knee   no                            0.18             +--------------+---------+------+-----------+------------+--------+ GSV prox calf no                            0.18             +--------------+---------+------+-----------+------------+--------+ GSV mid calf            yes    >500 ms      0.34             +--------------+---------+------+-----------+------------+--------+ GSV dist calf no                            0.33             +--------------+---------+------+-----------+------------+--------+ SSV at [facility]              yes    >500 ms      0.5              +--------------+---------+------+-----------+------------+--------+ SSV prox calf           yes    >500 ms      0.38             +--------------+---------+------+-----------+------------+--------+  +--------------+---------+------+-----------+------------+--------+ LEFT          Reflux NoRefluxReflux TimeDiameter cmsComments                         Yes                                  +--------------+---------+------+-----------+------------+--------+ CFV                     yes   >1 second                      +--------------+---------+------+-----------+------------+--------+ FV mid                  yes   >1 second                      +--------------+---------+------+-----------+------------+--------+ Popliteal               yes   >1 second                      +--------------+---------+------+-----------+------------+--------+ GSV at SFJ              yes    >500 ms      1.28             +--------------+---------+------+-----------+------------+--------+ GSV prox thigh          yes    >500 ms      0.6               +--------------+---------+------+-----------+------------+--------+  GSV mid thigh           yes    >500 ms      0.51             +--------------+---------+------+-----------+------------+--------+ GSV dist thigh          yes    >500 ms      0.5              +--------------+---------+------+-----------+------------+--------+ GSV at knee             yes    >500 ms      0.52             +--------------+---------+------+-----------+------------+--------+ GSV prox calf           yes    >500 ms      0.48             +--------------+---------+------+-----------+------------+--------+ GSV mid calf  no                            0.24             +--------------+---------+------+-----------+------------+--------+ SSV at [facility]    no                            0.26             +--------------+---------+------+-----------+------------+--------+ SSV prox calf no                            0.2              +--------------+---------+------+-----------+------------+--------+   Summary: Bilateral: - No evidence of deep vein thrombosis seen in the lower extremities, bilaterally, from the common femoral through the popliteal veins. - No evidence of superficial venous thrombosis in the lower extremities, bilaterally.  Right: - Venous reflux is noted in the right greater saphenous vein in the thigh. - Venous reflux is noted in the right greater saphenous vein in the calf. - Venous reflux is noted in the right popliteal vein. - Venous reflux is noted in the right short saphenous vein.  Left: - Venous reflux is noted in the left common femoral vein. - Venous reflux is noted in the left sapheno-femoral junction. - Venous reflux is noted in the left greater saphenous vein in the thigh. - Venous reflux is noted in the left greater saphenous vein in the calf. - Venous reflux is noted in the left femoral vein. - Venous reflux is noted in the left popliteal vein.  *See table(s) above for  measurements and observations. Electronically signed by Norman Serve on 12/27/2023 at 2:45:41 PM.    Final      Time coordinating discharge: Over 30 minutes    Alm Apo, MD  [facility] 01/20/2024, 11:44 AM

## 2024-01-20 NOTE — Progress Notes (Signed)
 Physical Therapy Treatment Patient Details Name: Isaac Phelps MRN: [id number] DOB: 1930/07/06 Today's Date: 01/20/2024   History of Present Illness 89 y.o. male admitted to the hospital 01/13/24 with edema of legs, noted uncontrolled hypertension.PMH: treated rectal cancer, hypertension, BPH, syncope and collapse    PT Comments  Pt requests therapist return, reports not wanting to be in rehab for long I'll give them a week and wanting to complete therapy today. Pt comes to sitting EOB with increased time and effort, heavy use of bedrails and elevated HOB. Pt powers to stand from elevated bed with mod A, difficulty shifting weight forward and powering up on initial rep, on 2nd rep needing min A to power up and shift weight forward after cued to scoot forward on bedside and push from seated surface. Pt amb short distance in room with RW, slow step through gait pattern, minimal bil foot clearance, min A to steady for 15 ft x2 with seated rest break between. Pt requests SPC trial, needing mod A to prevent full LOB, significant increase in unsteadiness, very short quick steps with minimal foot clearance and minimal single limb stance. Pt tolerates STS reps from recliner for LE strengthening, min A to power up, cues to scoot forward and use BUE to assist; trialed no UE assisting to power up and pt requiring max A to power up to stand. Pt remains up in recliner at EOS, no chair alarm in chair but nurse aware and returning to room with pt's medication.   If plan is discharge home, recommend the following: A lot of help with bathing/dressing/bathroom;Assist for transportation;Help with stairs or ramp for entrance;A little help with walking and/or transfers   Can travel by private vehicle     Yes  Equipment Recommendations  None recommended by PT    Recommendations for Other Services       Precautions / Restrictions Precautions Precautions: Fall Precaution/Restrictions Comments: BLE  dressings Restrictions Weight Bearing Restrictions Per Provider Order: No     Mobility  Bed Mobility Overal bed mobility: Needs Assistance Bed Mobility: Supine to Sit     Supine to sit: Contact guard, HOB elevated, Used rails     General bed mobility comments: increased time and effort, pt using bedrails and elevated HOB to come to bedside    Transfers Overall transfer level: Needs assistance Equipment used: Rolling walker (2 wheels) Transfers: Sit to/from Stand Sit to Stand: Mod assist, From elevated surface, Min assist           General transfer comment: mod A to power up from elevated bed, cues for hand placement to power up and for descent back to sitting to prevent plopping into chair; mod A improving to min A with cues for sliding forward in chair, pushing up from seated surface and BLE engagement to press down into floor    Ambulation/Gait Ambulation/Gait assistance: Min assist, Mod assist Gait Distance (Feet): 15 Feet (x3) Assistive device: Rolling walker (2 wheels), Straight cane Gait Pattern/deviations: Step-through pattern, Decreased stride length, Trunk flexed, Wide base of support Gait velocity: decreased     General Gait Details: min A for 15 ft x2 in room with RW, slow step through gait pattern with decreased bil step length, minimal bil foot clearance, trunk forward flexed, BOS varies at times to wide then to normal, fatigues with distance needed seated rest break; mod A with SPC trial per pt request, significant increase in unsteadiness with quick short steps and minimal single limb stance, pt with increased  lateral weight shift, educated on RW use and pt verbalizes understanding and agreement   Stairs             Wheelchair Mobility     Tilt Bed    Modified Rankin (Stroke Patients Only)       Balance Overall balance assessment: Needs assistance Sitting-balance support: Feet supported Sitting balance-Leahy Scale: Fair     Standing  balance support: Bilateral upper extremity supported, During functional activity, Reliant on assistive device for balance Standing balance-Leahy Scale: Poor                              Communication Communication Communication: No apparent difficulties Factors Affecting Communication: Hearing impaired  Cognition Arousal: Alert Behavior During Therapy: WFL for tasks assessed/performed   PT - Cognitive impairments: Difficult to assess, Safety/Judgement                         Following commands: Intact      Cueing Cueing Techniques: Verbal cues  Exercises General Exercises - Lower Extremity Long Arc Quad: AROM, Both, 10 reps, Seated Hip Flexion/Marching: AROM, Both, 10 reps, Seated Other Exercises Other Exercises: standing marching with RW 10 reps x2 sets, min A for steadying Other Exercises: STS reps from recliner with RW x10 reps, BUE assisting with min A; trialed no UE assist requiring max A to power up    General Comments        Pertinent Vitals/Pain Pain Assessment Pain Assessment: Faces Faces Pain Scale: Hurts little more Pain Location: distal BLE Pain Descriptors / Indicators: Discomfort, Sore Pain Intervention(s): Limited activity within patient's tolerance, Monitored during session, Repositioned    Home Living                          Prior Function            PT Goals (current goals can now be found in the care plan section) Acute Rehab PT Goals Patient Stated Goal: to go home PT Goal Formulation: With patient Time For Goal Achievement: 01/30/24 Potential to Achieve Goals: Fair Progress towards PT goals: Progressing toward goals    Frequency    Min 2X/week      PT Plan      Co-evaluation              AM-PAC PT 6 Clicks Mobility   Outcome Measure  Help needed turning from your back to your side while in a flat bed without using bedrails?: A Little Help needed moving from lying on your back to sitting  on the side of a flat bed without using bedrails?: A Little Help needed moving to and from a bed to a chair (including a wheelchair)?: A Lot Help needed standing up from a chair using your arms (e.g., wheelchair or bedside chair)?: A Little Help needed to walk in hospital room?: A Lot Help needed climbing 3-5 steps with a railing? : Total 6 Click Score: 14    End of Session Equipment Utilized During Treatment: Gait belt Activity Tolerance: Patient tolerated treatment well Patient left: in chair;with call bell/phone within reach;Other (comment) (RN returning to room with medication, aware no chair alarm on in chair) Nurse Communication: Mobility status PT Visit Diagnosis: Unsteadiness on feet (R26.81);Difficulty in walking, not elsewhere classified (R26.2);Pain     Time: 8682-8648 PT Time Calculation (min) (ACUTE ONLY): 34 min  Charges:    $  Gait Training: 8-22 mins $Therapeutic Exercise: 8-22 mins PT General Charges $$ ACUTE PT VISIT: 1 Visit                     Tori Ehan Freas PT, DPT 01/20/24, 3:00 PM

## 2024-01-28 ENCOUNTER — Encounter (HOSPITAL_BASED_OUTPATIENT_CLINIC_OR_DEPARTMENT_OTHER)

## 2024-02-13 ENCOUNTER — Encounter: Payer: Self-pay | Admitting: Oncology

## 2024-02-19 ENCOUNTER — Ambulatory Visit (INDEPENDENT_AMBULATORY_CARE_PROVIDER_SITE_OTHER): Admitting: Podiatry

## 2024-02-19 DIAGNOSIS — M79674 Pain in right toe(s): Secondary | ICD-10-CM

## 2024-02-19 DIAGNOSIS — M79675 Pain in left toe(s): Secondary | ICD-10-CM | POA: Diagnosis not present

## 2024-02-19 DIAGNOSIS — B351 Tinea unguium: Secondary | ICD-10-CM | POA: Diagnosis not present

## 2024-02-19 NOTE — Progress Notes (Signed)
  Subjective:  Patient ID: Isaac Phelps, male    DOB: 07-19-1930,  MRN: [id number]  Chief Complaint  Patient presents with   Nail Problem    Pt stated that he is here to have his nails trimmed    89 y.o. male returns for the above complaint.  Patient presents with thickened fungal dystrophic mycotic toenails x 10 mild pain on palpation worse with ambulation for pressure would like for me to do it he is unable to do it himself denies any other acute complaints  Objective:  There were no vitals filed for this visit. Podiatric Exam: Vascular: dorsalis pedis and posterior tibial pulses are palpable bilateral. Capillary return is immediate. Temperature gradient is WNL. Skin turgor WNL  Sensorium: Normal Semmes Weinstein monofilament test. Normal tactile sensation bilaterally. Nail Exam: Pt has thick disfigured discolored nails with subungual debris noted bilateral entire nail hallux through fifth toenails.  Pain on palpation to the nails. Ulcer Exam: There is no evidence of ulcer or pre-ulcerative changes or infection. Orthopedic Exam: Muscle tone and strength are WNL. No limitations in general ROM. No crepitus or effusions noted.  Skin: No Porokeratosis. No infection or ulcers    Assessment & Plan:   1. Pain due to onychomycosis of toenails of both feet     Patient was evaluated and treated and all questions answered.  Onychomycosis with pain  -Nails palliatively debrided as below. -Educated on self-care  Procedure: Nail Debridement Rationale: pain  Type of Debridement: manual, sharp debridement. Instrumentation: Nail nipper, rotary burr. Number of Nails: 10  Procedures and Treatment: Consent by patient was obtained for treatment procedures. The patient understood the discussion of treatment and procedures well. All questions were answered thoroughly reviewed. Debridement of mycotic and hypertrophic toenails, 1 through 5 bilateral and clearing of subungual debris. No ulceration, no  infection noted.  Return Visit-Office Procedure: Patient instructed to return to the office for a follow up visit 3 months for continued evaluation and treatment.  Franky Blanch, DPM    No follow-ups on file.

## 2024-02-21 ENCOUNTER — Encounter (HOSPITAL_BASED_OUTPATIENT_CLINIC_OR_DEPARTMENT_OTHER): Payer: Self-pay | Admitting: Family

## 2024-02-25 ENCOUNTER — Telehealth: Payer: Self-pay | Admitting: *Deleted

## 2024-02-25 NOTE — Telephone Encounter (Signed)
Okay for verbal orders? Please advise 

## 2024-02-25 NOTE — Telephone Encounter (Signed)
 Copied from CRM [id number]. Topic: Clinical - Home Health Verbal Orders >> Feb 25, 2024 10:27 AM Richerd A wrote: Caller/Agency: [facility] Callback Number: [phone] Service Requested: Physical Therapy Frequency: 1 weeks 4 weeks Any new concerns about the patient? No

## 2024-02-26 NOTE — Telephone Encounter (Signed)
Verbal orders given to [facility]

## 2024-03-13 ENCOUNTER — Inpatient Hospital Stay: Admitting: Adult Health

## 2024-03-13 ENCOUNTER — Encounter: Payer: Self-pay | Admitting: Adult Health

## 2024-03-13 ENCOUNTER — Ambulatory Visit (INDEPENDENT_AMBULATORY_CARE_PROVIDER_SITE_OTHER): Admitting: Adult Health

## 2024-03-13 VITALS — BP 140/80 | HR 67 | Temp 97.6°F | Ht 72.0 in | Wt 186.0 lb

## 2024-03-13 DIAGNOSIS — R5381 Other malaise: Secondary | ICD-10-CM | POA: Diagnosis not present

## 2024-03-13 DIAGNOSIS — I872 Venous insufficiency (chronic) (peripheral): Secondary | ICD-10-CM

## 2024-03-13 DIAGNOSIS — E785 Hyperlipidemia, unspecified: Secondary | ICD-10-CM

## 2024-03-13 DIAGNOSIS — R600 Localized edema: Secondary | ICD-10-CM

## 2024-03-13 DIAGNOSIS — I10 Essential (primary) hypertension: Secondary | ICD-10-CM

## 2024-03-13 DIAGNOSIS — I5033 Acute on chronic diastolic (congestive) heart failure: Secondary | ICD-10-CM

## 2024-03-13 DIAGNOSIS — N1831 Chronic kidney disease, stage 3a: Secondary | ICD-10-CM

## 2024-03-13 DIAGNOSIS — E782 Mixed hyperlipidemia: Secondary | ICD-10-CM

## 2024-03-13 MED ORDER — FUROSEMIDE 20 MG PO TABS: 20.0000 mg | ORAL_TABLET | Freq: Every day | ORAL | 3 refills | Status: AC

## 2024-03-13 MED ORDER — ISOSORBIDE MONONITRATE ER 120 MG PO TB24: 120.0000 mg | ORAL_TABLET | Freq: Every day | ORAL | 3 refills | Status: AC

## 2024-03-13 MED ORDER — AMLODIPINE BESYLATE 10 MG PO TABS: 10.0000 mg | ORAL_TABLET | Freq: Every day | ORAL | 1 refills | Status: AC

## 2024-03-13 MED ORDER — TRAZODONE HCL 50 MG PO TABS: 50.0000 mg | ORAL_TABLET | Freq: Every evening | ORAL | 1 refills | Status: AC | PRN

## 2024-03-13 MED ORDER — SIMVASTATIN 40 MG PO TABS: 40.0000 mg | ORAL_TABLET | Freq: Every day | ORAL | 3 refills | Status: AC

## 2024-03-13 MED ORDER — HYDRALAZINE HCL 100 MG PO TABS: 100.0000 mg | ORAL_TABLET | Freq: Three times a day (TID) | ORAL | 3 refills | Status: AC

## 2024-03-13 NOTE — Progress Notes (Signed)
 Subjective:    Patient ID: Isaac Phelps, male    DOB: 1931/01/14, 89 y.o.   MRN: [id number]  HPI 89 year old male who  has a past medical history of Adenocarcinoma of rectum (HCC), Anxiety, BENIGN PROSTATIC HYPERTROPHY (04/01/2008), Coronary artery disease, GERD (gastroesophageal reflux disease), HYPERLIPIDEMIA (05/26/2007), HYPERTENSION (02/07/2007), Syncope and collapse, and TOBACCO ABUSE (04/01/2008).  He presents to the office today for TCM visit  Admit Date 01/13/2024 Discharge date 01/20/2024 Discharge from [facility] 02/22/2024  He was admitted to the hospital for lower extremity edema and poorly controlled hypertension.   Hospital Course  Hypertensive urgency/HTN - resolved during hospital admission  - Suspected due to not taking medications at home  - He was restarted on Norvasc  5 mg daily and Imdur  was increased to 120 mg daily   Physical Deconditioning/Fall - on 01/15/2024 he lowered himself to the ground after having a dream. He denied any trauma  - Evaluated by PT who recommended SNF  Venous Insufficiency/LE edema  - He was adamantly refusing care at home with daughter and also avoiding bathing/cleaning due to pain  - Evaluated by wound care - HHRN order placed to help continue care and inspection after discharge   Acute on Chronic Diastolic CHF - in the setting of uncontrolled HTN; also not fully compliant on lasix  at home  - LE edema in the setting of venous insufficiency and possible exacerbation  - was placed on IV lasix   Acute Kidney Injury superimposed on CKD stage IIIB - Baseline cr around 2, elevated due to uncontrolled HTN  Cellulitis  - r/o  - abx stopped   Hyperlipidemia  - Continued on simvastatin  40 mg daily.   Today he presents to the office today with his son in law. He reports that he is doing much better overall. He continues to have lower extremity edema nowhere close to where it was prior to being.  His son-in-law feels as though he has had some more  fluid in his legs especially his left leg discharged from skilled nursing facility but it does not sound like he is elevating his legs as much.  He is taking all of his medications.  It sounds like physical therapy is coming out to his home working with him but he does not have anything coming out to help with.  He has not had any fevers or chills.He does need refills on his medications    Review of Systems  Constitutional: Negative.   HENT: Negative.    Eyes: Negative.   Respiratory: Negative.    Cardiovascular:  Positive for leg swelling.  Gastrointestinal: Negative.   Endocrine: Negative.   Genitourinary: Negative.   Musculoskeletal: Negative.   Skin: Negative.   Allergic/Immunologic: Negative.   Neurological: Negative.   Hematological: Negative.   Psychiatric/Behavioral: Negative.    All other systems reviewed and are negative.  Past Medical History:  Diagnosis Date   Adenocarcinoma of rectum (HCC)    Anxiety    BENIGN PROSTATIC HYPERTROPHY 04/01/2008   Coronary artery disease    GERD (gastroesophageal reflux disease)    HYPERLIPIDEMIA 05/26/2007   HYPERTENSION 02/07/2007   Syncope and collapse    TOBACCO ABUSE 04/01/2008    Social History   Socioeconomic History   Marital status: Widowed    Spouse name: Not on file   Number of children: 1   Years of education: Not on file   Highest education level: Not on file  Occupational History   Not on file  Tobacco Use   Smoking status: Former    Current packs/day: 0.00    Types: Cigarettes    Quit date: 2025    Years since quitting: 0.7   Smokeless tobacco: Never  Vaping Use   Vaping status: Never Used  Substance and Sexual Activity   Alcohol  use: No   Drug use: No   Sexual activity: Not on file  Other Topics Concern   Not on file  Social History Narrative   Not on file   Social Drivers of Health   Financial Resource Strain: Low Risk  (11/21/2023)   Overall Financial Resource Strain (CARDIA)    Difficulty of  Paying Living Expenses: Not hard at all  Food Insecurity: No Food Insecurity (01/13/2024)   Hunger Vital Sign    Worried About Running Out of Food in the Last Year: Never true    Ran Out of Food in the Last Year: Never true  Transportation Needs: No Transportation Needs (01/13/2024)   PRAPARE - Administrator, Civil Service (Medical): No    Lack of Transportation (Non-Medical): No  Physical Activity: Inactive (11/21/2023)   Exercise Vital Sign    Days of Exercise per Week: 0 days    Minutes of Exercise per Session: 0 min  Stress: No Stress Concern Present (11/21/2023)   [employer] of Occupational Health - Occupational Stress Questionnaire    Feeling of Stress: Not at all  Social Connections: Socially Isolated (01/13/2024)   Social Connection and Isolation Panel    Frequency of Communication with Friends and Family: Twice a week    Frequency of Social Gatherings with Friends and Family: More than three times a week    Attends Religious Services: Never    Database administrator or Organizations: No    Attends Banker Meetings: Never    Marital Status: Widowed  Intimate Partner Violence: Not At Risk (01/13/2024)   Humiliation, Afraid, Rape, and Kick questionnaire    Fear of Current or Ex-Partner: No    Emotionally Abused: No    Physically Abused: No    Sexually Abused: No    Past Surgical History:  Procedure Laterality Date   APPENDECTOMY     BIOPSY  08/03/2020   Procedure: BIOPSY;  Surgeon: Avram Lupita BRAVO, MD;  Location: [facility] ENDOSCOPY;  Service: Endoscopy;;   COLONOSCOPY WITH PROPOFOL  N/A 08/03/2020   Procedure: COLONOSCOPY WITH PROPOFOL ;  Surgeon: Avram Lupita BRAVO, MD;  Location: [facility] ENDOSCOPY;  Service: Endoscopy;  Laterality: N/A;   ESOPHAGOGASTRODUODENOSCOPY (EGD) WITH PROPOFOL  N/A 08/03/2020   Procedure: ESOPHAGOGASTRODUODENOSCOPY (EGD) WITH PROPOFOL ;  Surgeon: Avram Lupita BRAVO, MD;  Location: [facility] ENDOSCOPY;  Service: Endoscopy;  Laterality: N/A;    TONSILLECTOMY      Family History  Problem Relation Age of Onset   Arthritis Neg Hx        family   Hypertension Neg Hx        family   Stroke Neg Hx        family , 1st degree relative   Colon cancer Neg Hx    Colon polyps Neg Hx    Esophageal cancer Neg Hx    Rectal cancer Neg Hx    Stomach cancer Neg Hx     No Known Allergies  Current Outpatient Medications on File Prior to Visit  Medication Sig Dispense Refill   acetaminophen  (TYLENOL ) 650 MG CR tablet Take 1 tablet (650 mg total) by mouth daily as needed for pain.  amLODipine  (NORVASC ) 10 MG tablet Take 1 tablet (10 mg total) by mouth daily. 90 tablet 1   aspirin  81 MG chewable tablet Chew 81 mg by mouth daily.     isosorbide  mononitrate (IMDUR ) 120 MG 24 hr tablet Take 1 tablet (120 mg total) by mouth daily.     oxyCODONE  (OXY IR/ROXICODONE ) 5 MG immediate release tablet Take 1 tablet (5 mg total) by mouth daily as needed for moderate pain (pain score 4-6) or severe pain (pain score 7-10). 10 tablet 0   simvastatin  (ZOCOR ) 40 MG tablet TAKE 1 TABLET(40 MG) BY MOUTH DAILY 90 tablet 1   traZODone  (DESYREL ) 50 MG tablet Take 0.5 tablets (25 mg total) by mouth at bedtime as needed for sleep.     No current facility-administered medications on file prior to visit.    BP (!) 140/80   Pulse 67   Temp 97.6 F (36.4 C) (Oral)   Ht 6' (1.829 m)   Wt 186 lb (84.4 kg)   SpO2 97%   BMI 25.23 kg/m       Objective:   Physical Exam Vitals and nursing note reviewed.  Constitutional:      Appearance: Normal appearance. He is obese.  Cardiovascular:     Rate and Rhythm: Normal rate and regular rhythm.     Pulses: Normal pulses.     Heart sounds: Normal heart sounds.     Comments: He has well healing superficial wounds on the medial and lateral aspect of his right lower leg and ankle.  Pulmonary:     Effort: Pulmonary effort is normal.     Breath sounds: Normal breath sounds.  Musculoskeletal:     Right lower leg: 1+  Pitting Edema present.     Left lower leg: 2+ Pitting Edema present.  Skin:    General: Skin is warm and dry.  Neurological:     General: No focal deficit present.     Mental Status: He is alert and oriented to person, place, and time.  Psychiatric:        Mood and Affect: Mood normal.        Behavior: Behavior normal.        Thought Content: Thought content normal.        Judgment: Judgment normal.       Assessment & Plan:  1. Essential hypertension (Primary) - Reviewed hospital notes, labs, imaging and medication changes with patient and family.  - Hypertensive urgency resolved.  - better controlled. No change in medications  - furosemide  (LASIX ) 20 MG tablet; Take 1 tablet (20 mg total) by mouth daily.  Dispense: 90 tablet; Refill: 3 - amLODipine  (NORVASC ) 10 MG tablet; Take 1 tablet (10 mg total) by mouth daily.  Dispense: 90 tablet; Refill: 1 - isosorbide  mononitrate (IMDUR ) 120 MG 24 hr tablet; Take 1 tablet (120 mg total) by mouth daily.  Dispense: 90 tablet; Refill: 3 - hydrALAZINE  (APRESOLINE ) 100 MG tablet; Take 1 tablet (100 mg total) by mouth 3 (three) times daily.  Dispense: 270 tablet; Refill: 3  2. Physical deconditioning - Continue with PT   3. Chronic venous insufficiency - Improved. Encouraged to elevate legs at rest  - Wounds healing well but will order home health to keep an eye on them until completely healed.  - Take lasix  daily  - furosemide  (LASIX ) 20 MG tablet; Take 1 tablet (20 mg total) by mouth daily.  Dispense: 90 tablet; Refill: 3 - Ambulatory referral to Home Health  4. Lower  extremity edema  - furosemide  (LASIX ) 20 MG tablet; Take 1 tablet (20 mg total) by mouth daily.  Dispense: 90 tablet; Refill: 3 - Ambulatory referral to Home Health  5. Acute on chronic diastolic HF (heart failure) (HCC) - Continues to have lower extremity edema but no SOB or CPE  - isosorbide  mononitrate (IMDUR ) 120 MG 24 hr tablet; Take 1 tablet (120 mg total) by mouth  daily.  Dispense: 90 tablet; Refill: 3  6. Stage 3a chronic kidney disease (HCC) - Continue to monitor. Avoid nephrotoxic agents   7. Mixed hyperlipidemia - simvastatin  (ZOCOR ) 40 MG tablet; Take 1 tablet (40 mg total) by mouth daily at 6 PM.  Dispense: 90 tablet; Refill: 3  Chevie Birkhead, NP

## 2024-03-20 ENCOUNTER — Telehealth: Payer: Self-pay | Admitting: *Deleted

## 2024-03-20 ENCOUNTER — Inpatient Hospital Stay: Admitting: Adult Health

## 2024-03-20 NOTE — Telephone Encounter (Unsigned)
 Copied from CRM #[id number]. Topic: Clinical - Home Health Verbal Orders >> Mar 20, 2024  1:30 PM Shereese L wrote: Caller/Agency: [facility] Callback Number: [phone] Service Requested: Skilled Nursing Frequency: 2x a week for 5 weeks Any new concerns about the patient? Yes Patient has ulcers on his right leg and the office wants to try unnaboots on patient

## 2024-03-20 NOTE — Telephone Encounter (Signed)
 Verbal orders given to [facility].

## 2024-03-20 NOTE — Telephone Encounter (Signed)
 Ok Industrial/product designer

## 2024-03-23 ENCOUNTER — Encounter (HOSPITAL_BASED_OUTPATIENT_CLINIC_OR_DEPARTMENT_OTHER): Payer: Self-pay

## 2024-03-25 ENCOUNTER — Encounter (HOSPITAL_BASED_OUTPATIENT_CLINIC_OR_DEPARTMENT_OTHER): Payer: Self-pay

## 2024-03-26 ENCOUNTER — Encounter (HOSPITAL_BASED_OUTPATIENT_CLINIC_OR_DEPARTMENT_OTHER): Admitting: Family

## 2024-04-01 NOTE — Telephone Encounter (Signed)
 Called back but office is closed. Will try again tomorrow.

## 2024-04-01 NOTE — Telephone Encounter (Unsigned)
 Copied from CRM [id number]. Topic: General - Other >> Apr 01, 2024 11:48 AM Eva FALCON wrote: Reason for CRM: Darnelle from [facility] was calling in to advise that a nurse will be going out to Isaac Phelps tomorrow. An internal error was made that a nurse did a agency discharge instead of a discipline discharge. Will restart new episode and restart his nursing care. Is going to show two episodes but essentially is receiving the same care. If any more questions please reach out [phone].

## 2024-04-01 NOTE — Telephone Encounter (Signed)
 [facility] is returning kendra call

## 2024-04-02 NOTE — Telephone Encounter (Signed)
 Isaac Phelps stated by accident the PT completed an Agency discharge instead of PT discharge. They had to start over on the wound care due to the closing. However it still continued but may need to complete another form.

## 2024-04-02 NOTE — Telephone Encounter (Signed)
 Lm for Isaac Phelps to return call.

## 2024-04-13 ENCOUNTER — Ambulatory Visit: Admitting: Internal Medicine

## 2024-04-14 ENCOUNTER — Encounter (HOSPITAL_BASED_OUTPATIENT_CLINIC_OR_DEPARTMENT_OTHER): Payer: Self-pay

## 2024-05-05 ENCOUNTER — Telehealth: Payer: Self-pay

## 2024-05-05 NOTE — Telephone Encounter (Signed)
 Copied from CRM #[id number]. Topic: Clinical - Medical Advice >> May 05, 2024  9:11 AM Willma SAUNDERS wrote: Reason for CRM: Patients daughter Macario calling to see if NP Nafziger would be able to complete FMLA paperwork for her as she is the primary caretaker of her father who is establish with him.  Macario can be reached at [phone] >> May 05, 2024  9:24 AM Margarett M wrote: Call and spoke with Macario

## 2024-05-06 NOTE — Telephone Encounter (Signed)
 Noted! Lynn notified of update and will bring in PPW.

## 2024-05-07 ENCOUNTER — Ambulatory Visit: Admitting: Cardiovascular Disease

## 2024-05-08 ENCOUNTER — Telehealth: Payer: Self-pay

## 2024-05-08 NOTE — Telephone Encounter (Unsigned)
 Copied from CRM #[id number]. Topic: General - Other >> May 08, 2024 11:08 AM Alexandria E wrote: Reason for CRM: RICK GLENWOOD Mustard, nurse with [facility] home health, called in to make patient's PCP of a weight increase. Patient is usually around 182-183 lbs, but today his weight was 189 lbs. [facility] stated the patient is not have any new or worsening symptoms, just wanted to make PCP aware. Callback number [phone].

## 2024-05-15 ENCOUNTER — Ambulatory Visit: Admitting: Adult Health

## 2024-05-18 ENCOUNTER — Ambulatory Visit: Payer: Self-pay

## 2024-05-18 NOTE — Telephone Encounter (Addendum)
 Spoke with Deandra from [facility], she is calling to follow up on bilateral leg edema with weeping. Deandra is not with patient at this time and asks for call back to schedule. Attempted to reach patient daughter, left voice mail to call back nurse triage.   Copied from CRM #[id number]. Topic: Clinical - Red Word Triage >> May 18, 2024 10:41 AM Jasmin G wrote: Red Word that prompted transfer to Nurse Triage: Pt's nurse from [facility] called to follow up on a request she submitted last Thursday to get antibiotics for pt as she states that pt is presenting increased drainage from legs accompanied by a foul odor. >> May 18, 2024 11:07 AM Mesmerise C wrote: Pennie returning call was previously disconnected, transferred back to NT Reason for Disposition  SEVERE leg swelling (e.g., swelling extends above knee, entire leg is swollen, weeping fluid)  Answer Assessment - Initial Assessment Questions Allginate, uni boots.    1. LOCATION: Where is the wound located?      Right leg 2. WOUND APPEARANCE: What does the wound look like?      Entire leg, treated for cellulitis in the past 3. SIZE: If redness is present, ask: What is the size of the red area? (Inches, centimeters, or compare to size of a coin)      Yes entire leg  4. SPREAD: What's changed in the last day?  Do you see any red streaks coming from the wound?     Increased drainage 5. ONSET: When did it start to look infected?      Several  6. MECHANISM: How did the wound start, what was the cause?     chronic 7. PAIN: Do yu have any pain?  If Yes, ask: How bad is the pain?  (e.g., Scale 1-10; mild, moderate, or severe)      8. FEVER: Do you have a fever? If Yes, ask: What is your temperature, how was it measured, and when did it start?     denies 9. OTHER SYMPTOMS: Do you have any other symptoms? (e.g., shaking chills, weakness, rash elsewhere on body)     163/62 10. PREGNANCY: Is there any chance you are  pregnant? When was your last menstrual period?  Answer Assessment - Initial Assessment Questions Deandra from [facility] calling for follow up to leg edema, weeping. History of cellulitis. Offered sooner appointment, Deandra asks this writer to call daughter to schedule. Attempted to reach daughter, no answer, left message to call back.  Protocols used: Wound Infection Suspected-A-AH, Leg Swelling and Edema-A-AH

## 2024-05-19 NOTE — Telephone Encounter (Signed)
 Noted

## 2024-05-19 NOTE — Telephone Encounter (Signed)
 Left message to return phone call.

## 2024-05-20 ENCOUNTER — Ambulatory Visit: Admitting: Podiatry

## 2024-06-02 ENCOUNTER — Encounter: Payer: Self-pay | Admitting: Adult Health

## 2024-06-02 ENCOUNTER — Ambulatory Visit (INDEPENDENT_AMBULATORY_CARE_PROVIDER_SITE_OTHER): Admitting: Adult Health

## 2024-06-02 ENCOUNTER — Telehealth: Payer: Self-pay

## 2024-06-02 VITALS — BP 140/80 | HR 75 | Temp 98.4°F | Wt 188.8 lb

## 2024-06-02 DIAGNOSIS — L03115 Cellulitis of right lower limb: Secondary | ICD-10-CM | POA: Diagnosis not present

## 2024-06-02 DIAGNOSIS — Z23 Encounter for immunization: Secondary | ICD-10-CM

## 2024-06-02 DIAGNOSIS — S81801D Unspecified open wound, right lower leg, subsequent encounter: Secondary | ICD-10-CM

## 2024-06-02 DIAGNOSIS — I872 Venous insufficiency (chronic) (peripheral): Secondary | ICD-10-CM

## 2024-06-02 MED ORDER — DOXYCYCLINE HYCLATE 100 MG PO CAPS: 100.0000 mg | ORAL_CAPSULE | Freq: Two times a day (BID) | ORAL | 0 refills | Status: AC

## 2024-06-02 NOTE — Progress Notes (Signed)
 "  Subjective:    Patient ID: Isaac Phelps, male    DOB: July 14, 1930, 89 y.o.   MRN: [id number]  HPI Discussed the use of AI scribe software for clinical note transcription with the patient, who gave verbal consent to proceed.  History of Present Illness   Isaac Phelps is a 89 year old male who presents with a non-healing sore on his right leg.  He has a chronic sore around the right ankle and heel that continues to weep reddish brown fluid despite wrapping, though it has improved somewhat. The left leg is normal. Home health wound care is coming out to the home.   A home nurse usually changes the dressing twice weekly using gauze and a jelly fiber dressing, but he missed a recent visit and is expected later this week.  He uses an Radio broadcast assistant for compression from foot to knee with caregiver assistance. He has minimal pain with the current dressing, though it was uncomfortable before.  He notes his legs look thinner than before.        Review of Systems See HPI   Past Medical History:  Diagnosis Date   Adenocarcinoma of rectum (HCC)    Anxiety    BENIGN PROSTATIC HYPERTROPHY 04/01/2008   Coronary artery disease    GERD (gastroesophageal reflux disease)    HYPERLIPIDEMIA 05/26/2007   HYPERTENSION 02/07/2007   Syncope and collapse    TOBACCO ABUSE 04/01/2008    Social History   Socioeconomic History   Marital status: Widowed    Spouse name: Not on file   Number of children: 1   Years of education: Not on file   Highest education level: Not on file  Occupational History   Not on file  Tobacco Use   Smoking status: Former    Current packs/day: 0.00    Types: Cigarettes    Quit date: 2025    Years since quitting: 0.9   Smokeless tobacco: Never  Vaping Use   Vaping status: Never Used  Substance and Sexual Activity   Alcohol  use: No   Drug use: No   Sexual activity: Not on file  Other Topics Concern   Not on file  Social History Narrative   Not on file   Social  Drivers of Health   Tobacco Use: Medium Risk (06/02/2024)   Patient History    Smoking Tobacco Use: Former    Smokeless Tobacco Use: Never    Passive Exposure: Not on file  Financial Resource Strain: Low Risk (11/21/2023)   Overall Financial Resource Strain (CARDIA)    Difficulty of Paying Living Expenses: Not hard at all  Food Insecurity: No Food Insecurity (01/13/2024)   Epic    Worried About Programme Researcher, Broadcasting/film/video in the Last Year: Never true    Ran Out of Food in the Last Year: Never true  Transportation Needs: No Transportation Needs (01/13/2024)   Epic    Lack of Transportation (Medical): No    Lack of Transportation (Non-Medical): No  Physical Activity: Inactive (11/21/2023)   Exercise Vital Sign    Days of Exercise per Week: 0 days    Minutes of Exercise per Session: 0 min  Stress: No Stress Concern Present (11/21/2023)   [employer] of Occupational Health - Occupational Stress Questionnaire    Feeling of Stress: Not at all  Social Connections: Socially Isolated (01/13/2024)   Social Connection and Isolation Panel    Frequency of Communication with Friends and Family: Twice a week  Frequency of Social Gatherings with Friends and Family: More than three times a week    Attends Religious Services: Never    Database Administrator or Organizations: No    Attends Banker Meetings: Never    Marital Status: Widowed  Intimate Partner Violence: Not At Risk (01/13/2024)   Epic    Fear of Current or Ex-Partner: No    Emotionally Abused: No    Physically Abused: No    Sexually Abused: No  Depression (PHQ2-9): Low Risk (11/21/2023)   Depression (PHQ2-9)    PHQ-2 Score: 0  Alcohol  Screen: Low Risk (11/21/2023)   Alcohol  Screen    Last Alcohol  Screening Score (AUDIT): 0  Housing: Low Risk (01/13/2024)   Epic    Unable to Pay for Housing in the Last Year: No    Number of Times Moved in the Last Year: 0    Homeless in the Last Year: No  Utilities: Not At Risk  (01/13/2024)   Epic    Threatened with loss of utilities: No  Health Literacy: Inadequate Health Literacy (11/21/2023)   B1300 Health Literacy    Frequency of need for help with medical instructions: Often    Past Surgical History:  Procedure Laterality Date   APPENDECTOMY     BIOPSY  08/03/2020   Procedure: BIOPSY;  Surgeon: Avram Lupita BRAVO, MD;  Location: [facility] ENDOSCOPY;  Service: Endoscopy;;   COLONOSCOPY WITH PROPOFOL  N/A 08/03/2020   Procedure: COLONOSCOPY WITH PROPOFOL ;  Surgeon: Avram Lupita BRAVO, MD;  Location: [facility] ENDOSCOPY;  Service: Endoscopy;  Laterality: N/A;   ESOPHAGOGASTRODUODENOSCOPY (EGD) WITH PROPOFOL  N/A 08/03/2020   Procedure: ESOPHAGOGASTRODUODENOSCOPY (EGD) WITH PROPOFOL ;  Surgeon: Avram Lupita BRAVO, MD;  Location: [facility] ENDOSCOPY;  Service: Endoscopy;  Laterality: N/A;   TONSILLECTOMY      Family History  Problem Relation Age of Onset   Arthritis Neg Hx        family   Hypertension Neg Hx        family   Stroke Neg Hx        family , 1st degree relative   Colon cancer Neg Hx    Colon polyps Neg Hx    Esophageal cancer Neg Hx    Rectal cancer Neg Hx    Stomach cancer Neg Hx     Allergies[1]  Medications Ordered Prior to Encounter[2]  BP (!) 140/80   Pulse 75   Temp 98.4 F (36.9 C)   Wt 188 lb 12.8 oz (85.6 kg)   SpO2 98%   BMI 25.61 kg/m       Objective:   Physical Exam Vitals and nursing note reviewed.  Constitutional:      Appearance: Normal appearance.  Cardiovascular:     Rate and Rhythm: Normal rate and regular rhythm.     Pulses: Normal pulses.     Heart sounds: Normal heart sounds.  Pulmonary:     Effort: Pulmonary effort is normal.     Breath sounds: Normal breath sounds.  Musculoskeletal:        General: Normal range of motion.       Legs:     Comments: Right leg with a weeping wound, brown in color, located on dorsum of right foot, on ankle and medial aspect of right leg  Skin:    General: Skin is warm and dry.  Neurological:      General: No focal deficit present.     Mental Status: He is alert and oriented to person, place,  and time.  Psychiatric:        Mood and Affect: Mood normal.        Behavior: Behavior normal.        Thought Content: Thought content normal.        Judgment: Judgment normal.         Assessment & Plan:  Assessment and Plan    Cellulitis of right lower extremity/non healing wounds  - Prescribed doxycycline  100 mg orally twice a day for 10 days. - Continue home nursing care twice a week for wound management. - Ensure proper application of Unna boot for compression and stability.  General Health Maintenance He had not received his flu shot for the current season. - Administered flu shot today.      - Follow up if wound not healing   Darleene Shape, NP   I personally spent a total of 32 minutes in the care of the patient today including preparing to see the patient, getting/reviewing separately obtained history, performing a medically appropriate exam/evaluation, counseling and educating, placing orders, and documenting clinical information in the EHR.       [1] No Known Allergies [2]  Current Outpatient Medications on File Prior to Visit  Medication Sig Dispense Refill   acetaminophen  (TYLENOL ) 650 MG CR tablet Take 1 tablet (650 mg total) by mouth daily as needed for pain.     amLODipine  (NORVASC ) 10 MG tablet Take 1 tablet (10 mg total) by mouth daily. 90 tablet 1   aspirin  81 MG chewable tablet Chew 81 mg by mouth daily.     furosemide  (LASIX ) 20 MG tablet Take 1 tablet (20 mg total) by mouth daily. 90 tablet 3   hydrALAZINE  (APRESOLINE ) 100 MG tablet Take 1 tablet (100 mg total) by mouth 3 (three) times daily. 270 tablet 3   isosorbide  mononitrate (IMDUR ) 120 MG 24 hr tablet Take 1 tablet (120 mg total) by mouth daily. 90 tablet 3   oxyCODONE  (OXY IR/ROXICODONE ) 5 MG immediate release tablet Take 1 tablet (5 mg total) by mouth daily as needed for moderate pain (pain score  4-6) or severe pain (pain score 7-10). 10 tablet 0   simvastatin  (ZOCOR ) 40 MG tablet Take 1 tablet (40 mg total) by mouth daily at 6 PM. 90 tablet 3   traZODone  (DESYREL ) 50 MG tablet Take 1 tablet (50 mg total) by mouth at bedtime as needed for sleep. 90 tablet 1   No current facility-administered medications on file prior to visit.   "

## 2024-06-02 NOTE — Telephone Encounter (Unsigned)
 Copied from CRM #[id number]. Topic: Clinical - Home Health Verbal Orders >> Jun 01, 2024 10:01 AM Tinnie BROCKS wrote: Caller/Agency: [facility] Callback Number: [phone] Service Requested: Skilled Nursing Frequency: 2x a week for wound care for 8 weeks Any new concerns about the patient? No

## 2024-06-02 NOTE — Telephone Encounter (Signed)
Called number but no answer. Will try again tomorrow.

## 2024-06-03 NOTE — Telephone Encounter (Signed)
 Left message to return phone call.

## 2024-06-10 ENCOUNTER — Ambulatory Visit: Admitting: Adult Health

## 2024-06-25 ENCOUNTER — Other Ambulatory Visit: Payer: Self-pay | Admitting: Adult Health

## 2024-06-25 ENCOUNTER — Telehealth: Payer: Self-pay

## 2024-06-25 DIAGNOSIS — L03115 Cellulitis of right lower limb: Secondary | ICD-10-CM

## 2024-06-25 DIAGNOSIS — S81801D Unspecified open wound, right lower leg, subsequent encounter: Secondary | ICD-10-CM

## 2024-06-25 DIAGNOSIS — I872 Venous insufficiency (chronic) (peripheral): Secondary | ICD-10-CM

## 2024-06-25 NOTE — Telephone Encounter (Signed)
 Copied from CRM #[id number]. Topic: Clinical - Request for Lab/Test Order >> Jun 25, 2024  1:49 PM Robinson H wrote: Reason for CRM: Jon with [facility] states they were seeing patient for home health nursing wound care, states that [facility] doesn't have nursing services anymore, [facility] has agreed to take over but just need a new order. Current order is greater thatn 30 days and new home health can't take over 30 days. Order can be uploaded in [facility] [phone] Beverley Ann [facility] [phone] Macario [facility] Patients dauhter [phone]

## 2024-06-26 NOTE — Telephone Encounter (Signed)
 Noted.

## 2024-06-26 NOTE — Telephone Encounter (Signed)
 Per Jon at [place] [facility] pt has been scheduled with [facility].

## 2024-07-08 ENCOUNTER — Ambulatory Visit: Admitting: Podiatry

## 2024-08-14 ENCOUNTER — Ambulatory Visit: Admitting: Podiatry
